# Patient Record
Sex: Male | Born: 1980 | Race: White | Hispanic: No | Marital: Married | Smoking: Former smoker
Health system: Southern US, Community
[De-identification: ages and names within clinical notes are randomized; demographics above are authoritative.]

## PROBLEM LIST (undated history)

## (undated) DIAGNOSIS — C760 Malignant neoplasm of head, face and neck: Secondary | ICD-10-CM

## (undated) DIAGNOSIS — K219 Gastro-esophageal reflux disease without esophagitis: Secondary | ICD-10-CM

## (undated) DIAGNOSIS — E039 Hypothyroidism, unspecified: Secondary | ICD-10-CM

## (undated) DIAGNOSIS — B958 Unspecified staphylococcus as the cause of diseases classified elsewhere: Secondary | ICD-10-CM

## (undated) DIAGNOSIS — R112 Nausea with vomiting, unspecified: Secondary | ICD-10-CM

## (undated) DIAGNOSIS — R682 Dry mouth, unspecified: Secondary | ICD-10-CM

## (undated) DIAGNOSIS — C029 Malignant neoplasm of tongue, unspecified: Secondary | ICD-10-CM

## (undated) DIAGNOSIS — Z923 Personal history of irradiation: Secondary | ICD-10-CM

## (undated) DIAGNOSIS — R001 Bradycardia, unspecified: Principal | ICD-10-CM

## (undated) DIAGNOSIS — Z9889 Other specified postprocedural states: Secondary | ICD-10-CM

## (undated) DIAGNOSIS — E781 Pure hyperglyceridemia: Secondary | ICD-10-CM

## (undated) DIAGNOSIS — M109 Gout, unspecified: Secondary | ICD-10-CM

## (undated) DIAGNOSIS — N209 Urinary calculus, unspecified: Secondary | ICD-10-CM

## (undated) DIAGNOSIS — Z87442 Personal history of urinary calculi: Secondary | ICD-10-CM

## (undated) DIAGNOSIS — H9319 Tinnitus, unspecified ear: Secondary | ICD-10-CM

## (undated) DIAGNOSIS — L089 Local infection of the skin and subcutaneous tissue, unspecified: Secondary | ICD-10-CM

## (undated) HISTORY — PX: TRACHEOSTOMY CLOSURE: SHX458

## (undated) HISTORY — PX: GLOSSECTOMY: SUR645

## (undated) HISTORY — DX: Malignant neoplasm of tongue, unspecified: C02.9

## (undated) HISTORY — DX: Malignant neoplasm of head, face and neck: C76.0

## (undated) HISTORY — PX: MOUTH SURGERY: SHX715

## (undated) HISTORY — DX: Bradycardia, unspecified: R00.1

## (undated) HISTORY — PX: TONGUE BIOPSY: SHX1075

## (undated) HISTORY — DX: Dry mouth, unspecified: R68.2

## (undated) HISTORY — DX: Personal history of irradiation: Z92.3

---

## 2001-09-16 ENCOUNTER — Emergency Department (HOSPITAL_COMMUNITY): Admission: EM | Admit: 2001-09-16 | Discharge: 2001-09-16 | Payer: Self-pay | Admitting: Emergency Medicine

## 2001-11-27 ENCOUNTER — Emergency Department (HOSPITAL_COMMUNITY): Admission: EM | Admit: 2001-11-27 | Discharge: 2001-11-28 | Payer: Self-pay | Admitting: *Deleted

## 2001-11-27 ENCOUNTER — Encounter: Payer: Self-pay | Admitting: *Deleted

## 2008-09-28 ENCOUNTER — Inpatient Hospital Stay (HOSPITAL_COMMUNITY): Admission: AD | Admit: 2008-09-28 | Discharge: 2008-09-30 | Payer: Self-pay | Admitting: Internal Medicine

## 2010-05-04 LAB — COMPREHENSIVE METABOLIC PANEL
ALT: 29 U/L (ref 0–53)
AST: 24 U/L (ref 0–37)
Albumin: 3.7 g/dL (ref 3.5–5.2)
Alkaline Phosphatase: 61 U/L (ref 39–117)
BUN: 11 mg/dL (ref 6–23)
CO2: 28 mEq/L (ref 19–32)
Calcium: 9.2 mg/dL (ref 8.4–10.5)
Chloride: 104 mEq/L (ref 96–112)
Creatinine, Ser: 1.21 mg/dL (ref 0.4–1.5)
GFR calc Af Amer: >60 mL/min (ref >60)
GFR calc non Af Amer: >60 mL/min (ref >60)
Glucose, Bld: 110 mg/dL — ABNORMAL HIGH (ref 70–99)
Potassium: 4.7 mEq/L (ref 3.5–5.1)
Sodium: 137 mEq/L (ref 135–145)
Total Bilirubin: 0.4 mg/dL (ref 0.3–1.2)
Total Protein: 7.2 g/dL (ref 6.0–8.3)

## 2010-05-04 LAB — DIFFERENTIAL
Basophils Absolute: 0.1 10*3/uL (ref 0.0–0.1)
Basophils Absolute: 0.1 10*3/uL (ref 0.0–0.1)
Basophils Relative: 1 % (ref 0–1)
Basophils Relative: 1 % (ref 0–1)
Eosinophils Absolute: 0.1 10*3/uL (ref 0.0–0.7)
Eosinophils Relative: 1 % (ref 0–5)
Eosinophils Relative: 3 % (ref 0–5)
Lymphocytes Relative: 22 % (ref 12–46)
Lymphocytes Relative: 9 % — ABNORMAL LOW (ref 12–46)
Lymphs Abs: 1.2 10*3/uL (ref 0.7–4.0)
Monocytes Absolute: 1 10*3/uL (ref 0.1–1.0)
Monocytes Relative: 8 % (ref 3–12)
Neutro Abs: 10.9 10*3/uL — ABNORMAL HIGH (ref 1.7–7.7)
Neutro Abs: 4.4 10*3/uL (ref 1.7–7.7)
Neutrophils Relative %: 82 % — ABNORMAL HIGH (ref 43–77)

## 2010-05-04 LAB — BASIC METABOLIC PANEL
BUN: 7 mg/dL (ref 6–23)
CO2: 27 mEq/L (ref 19–32)
Calcium: 9.1 mg/dL (ref 8.4–10.5)
GFR calc non Af Amer: >60 mL/min (ref >60)
Glucose, Bld: 100 mg/dL — ABNORMAL HIGH (ref 70–99)

## 2010-05-04 LAB — WOUND CULTURE

## 2010-05-04 LAB — CBC
HCT: 39.2 % (ref 39.0–52.0)
Hemoglobin: 13.9 g/dL (ref 13.0–17.0)
MCHC: 34.7 g/dL (ref 30.0–36.0)
MCHC: 35.4 g/dL (ref 30.0–36.0)
MCV: 88 fL (ref 78.0–100.0)
Platelets: 232 10*3/uL (ref 150–400)
Platelets: 239 10*3/uL (ref 150–400)
RBC: 4.45 MIL/uL (ref 4.22–5.81)
RDW: 12.6 % (ref 11.5–15.5)
RDW: 12.7 % (ref 11.5–15.5)
WBC: 13.2 10*3/uL — ABNORMAL HIGH (ref 4.0–10.5)

## 2010-05-04 LAB — VANCOMYCIN, TROUGH: Vancomycin Tr: <5 ug/mL — ABNORMAL LOW (ref 10.0–20.0)

## 2010-06-12 NOTE — H&P (Signed)
**Note [name]** [name], [name]                 ACCOUNT NO.:  [id number]   MEDICAL RECORD NO.:  [id number]          PATIENT TYPE:  INP   LOCATION:  A216                          FACILITY:  [facility]   PHYSICIAN:  Melissa L. Ladona Ridgel, MD  DATE OF BIRTH:  [birth year]   DATE OF ADMISSION:  09/28/2008  DATE OF DISCHARGE:  LH                              HISTORY & PHYSICAL   CHIEF COMPLAINT:  Left arm pain.   HISTORY OF PRESENT ILLNESS:  The patient is a 30 year old white male who  developed a pimple-like a sore on his left forearm around the area of  his elbow on the lateral side of his arm.  The pimple occurred last  Tuesday.  He played with it, squeezed it and was able to get some  purulent  material out of it but then it stopped draining and started to  increase in size with increasing tightness,swelling, redness and pain.  The patient went to Dr. Sharyon Medicus office on Tuesday and was given a shot  of antibiotics as well as a prescription for medication which he does  not know the name.  He went home to get the antibiotics but the pain  became worse and the swelling worsened as well as redness, so he came to  Dr. Sharyon Medicus office on the day of admission and was asked to be admitted  to the hospital.  The patient had a low grade temperature at that point.  Had some nausea without any vomiting.   REASON FOR ADMISSION:  He did have some intentional weight loss and is  using [employer].  Fever is new.  Chills are new.  Positive nausea  but no vomiting.  EYES:  No blurred vision, double vision.  EARS, NOSE  AND THROAT:  No tinnitus, dysphagia, discharge.  CARDIOVASCULAR:  Chest  pain is negative.  No palpitations.  RESPIRATORY: No shortness of breath  or cough.  GI:  He had  one episode of diarrhea but no constipation.  No  melena, hematochezia.  GU: No hesitancy, frequency, dysuria.  NEUROLOGICALLY:  No headaches or seizure disorder.  MUSCULOSKELETAL:  A  pain in the left arm as described above.  He could not  rate the pain, it  varied.  Described more of a tightness.  HEMATOLOGICALLY:  No bleeding  disorder.  PSYCHIATRIC:  No depression or anxiety.  DERMATOLOGIC:  Cellulitis and abscess of the left forearm as described above.  ENDOCRINOLOGICALLY:  He has no diabetes or thyroid disease.   PAST MEDICAL HISTORY:  1. He had had gout his ankles before, but nothing recent.  2. History of boils.   PAST SURGICAL HISTORY:  None.   ALLERGIES:  None.   MEDICATIONS:  He only took some Advil for pain and the antibiotics that  were prescribed   FAMILY HISTORY:  Mom has rheumatoid arthritis.  Dad is deceased with a  history of asthma.   SOCIAL HISTORY:  Has occasional alcohol.  He does not smoke but he  states that occasional, usually when he has a drink.  Has one child and  is an  [employer].  His contact person is his grandmother, Conall Vangorder.  Her number is [phone].   PHYSICAL EXAMINATION:  VITAL SIGNS:  Temperature was 102, blood pressure  127/73, pulse 94, respirations 18, saturation 95%.  GENERAL:  This is a well-developed, well-nourished, slightly obese,  white male in mild distress secondary to pain in his arm.  HEENT:  He is  normocephalic, atraumatic.  Pupils equal and reactive to light.  Extraocular muscles intact.  He has anicteric sclerae.  Membranes are  moist.  Neck is supple.  There is no JVD, no lymph nodes, no carotid  bruits.  His tympanic membranes are intact without any evidence for  infection.  Neck is supple.  There is no JVD, no lymphadenopathy.  No  carotid bruits.  CHEST:  Is clear to auscultation.  There is no rhonchi, rales or  wheezes.  CARDIOVASCULAR:  Regular rate and rhythm.  Positive S1 and S2.  No S3-4,  no murmurs, rubs or gallops.  ABDOMEN:  Soft, mildly obese, nontender, nondistended with positive  bowel sounds.  There is no guarding or rebound.  No hepatosplenomegaly  is noted.  EXTREMITIES:  On the lateral aspect of the left forearm around the  area  of the elbow, there is a significant cellulitis with an area of abscess  on the posterior side of the arm which feel soft and fluctuant.  There  is scab over the site.  NEUROLOGIC:  He is awake, alert, oriented.  Cranial nerves II-XII are  intact.  Power is 5/5.  DTRs 2+, plantars downgoing.   Labs are pending at the time of this admission.   ASSESSMENT:  This is a 30 year old white male who felt the wound-like  pimple or boil on his left arm last Tuesday.  He scraped it open and was  draining but now it has stopped and become more swollen, red and  painful.  The patient saw Dr. Sherwood Gambler, received an injection of  antibiotics; doses of medication is unknown and the prescription for  antibiotics which he started to take at home.  The wound became more  painful, more edematous and therefore he went back to Dr. Sharyon Medicus office  who referred him to hospital for admission and possible I&D.  1. Abscess of forearm.  I am starting him on the vancomycin and      Avelox.  I have consulted surgery for possible I&D.  2. Nausea.  We leave Zofran.  3. Fever.  Will leave Tylenol.   Total time on this case was 45 minutes.      Melissa L. Ladona Ridgel, MD  Electronically Signed     MLT/MEDQ  D:  09/29/2008  T:  09/29/2008  Job:  [id number]   cc:   Madelin Rear. Sherwood Gambler, MD  Fax: [phone]

## 2010-08-02 ENCOUNTER — Other Ambulatory Visit (HOSPITAL_COMMUNITY): Payer: Self-pay | Admitting: Family Medicine

## 2010-08-02 ENCOUNTER — Ambulatory Visit (HOSPITAL_COMMUNITY)
Admission: RE | Admit: 2010-08-02 | Discharge: 2010-08-02 | Disposition: A | Discharge status: Home / Self Care | Payer: 59 | Source: Ambulatory Visit | Attending: Family Medicine | Admitting: Family Medicine

## 2010-08-02 DIAGNOSIS — M779 Enthesopathy, unspecified: Secondary | ICD-10-CM

## 2010-08-02 DIAGNOSIS — S8990XA Unspecified injury of unspecified lower leg, initial encounter: Secondary | ICD-10-CM | POA: Insufficient documentation

## 2010-08-02 DIAGNOSIS — S99929A Unspecified injury of unspecified foot, initial encounter: Secondary | ICD-10-CM | POA: Insufficient documentation

## 2010-08-02 DIAGNOSIS — X500XXA Overexertion from strenuous movement or load, initial encounter: Secondary | ICD-10-CM | POA: Insufficient documentation

## 2010-08-02 DIAGNOSIS — M25579 Pain in unspecified ankle and joints of unspecified foot: Secondary | ICD-10-CM | POA: Insufficient documentation

## 2011-08-16 ENCOUNTER — Other Ambulatory Visit: Payer: Self-pay | Admitting: Otolaryngology

## 2011-08-16 DIAGNOSIS — C029 Malignant neoplasm of tongue, unspecified: Secondary | ICD-10-CM

## 2011-08-19 ENCOUNTER — Ambulatory Visit
Admission: RE | Admit: 2011-08-19 | Discharge: 2011-08-19 | Disposition: A | Discharge status: Home / Self Care | Payer: 59 | Source: Ambulatory Visit | Attending: Otolaryngology | Admitting: Otolaryngology

## 2011-08-19 DIAGNOSIS — C029 Malignant neoplasm of tongue, unspecified: Secondary | ICD-10-CM

## 2011-08-19 MED ORDER — IOHEXOL 300 MG/ML  SOLN
75.0000 mL | Freq: Once | INTRAMUSCULAR | Status: AC | PRN
  Administered 2011-08-19: 75 mL via INTRAVENOUS

## 2011-08-21 ENCOUNTER — Encounter (HOSPITAL_COMMUNITY): Payer: Self-pay | Admitting: *Deleted

## 2011-08-21 MED ORDER — DEXTROSE 5 % IV SOLN
3.0000 g | INTRAVENOUS | Status: AC
  Administered 2011-08-22: 3 g via INTRAVENOUS
  Filled 2011-08-21: qty 3000

## 2011-08-21 NOTE — H&P (Signed)
  Assessment  . Squamous cell carcinoma of the tongue   (141.9) Orders  CT Neck with contrast; Requested for: 15 Aug 2011. Discussed  Squamous cell carcinoma of the right lateral tongue. No palpable lymph nodes. This will need to be excised surgically with a wide resection of the tongue. We discussed that this needs to be done at the hospital, he will stay in the hospital at least overnight. He will have some change in his articulation but should be able to swallow. I will order a CT of the neck to look for any occult lymph nodes. We discussed the possible need for either therapeutic or prophylactic/staging neck dissection. We also discussed possible need for radiation postop. He absolutely needs to stop smoking for ever and needs to cut back significantly on his alcohol consumption. Reason For Visit  [name] is here today at the kind request of Elfredia Nevins for consultation and opinion for tongue cancer. HPI  5 or 6 month history of a sore on his right lateral tongue that hasn't healed. Recent biopsy was sent to [facility] and revealed squamous cell carcinoma. He denies any ear pain. He smokes about 5 small cigars daily and drinks a 12 pack of beer on the weekends. Allergies  No Known Drug Allergies. Current Meds  Zocor TABS;; RPT. Active Problems  Cancer (199.1);  * tongue, 15 Aug 2011. Family Hx  Family history of Hearing Loss Family history of Reported Family History Of Allergies Family history of Rheumatoid Arthritis. Personal Hx  Being A Social Drinker Current Smoker (305.1). ROS  Otolaryngeal: Tinnitus. Musculoskeletal: Arthralgias. 12 system ROS was obtained and reviewed on the Health Maintenance form dated today.  Positive responses are shown above.  If the symptom is not checked, the patient has denied it. Vital Signs   Recorded by [facility] on 15 Aug 2011 09:40 AM BP:120/80,  Height: 74 in, Weight: 275 lb, BMI: 35.3 kg/m2,  BSA Calculated: 2.49 ,  BMI  Calculated: 35.30. Physical Exam  APPEARANCE: Well developed, well nourished, in no acute distress.  Normal affect, in a pleasant mood.  Oriented to time, place and person. COMMUNICATION: Normal voice   HEAD & FACE:  No scars, lesions or masses of head and face.  Sinuses nontender to palpation.  Salivary glands without mass or tenderness.  Facial strength symmetric.  No facial lesion, scars, or mass. EYES: EOMI with normal primary gaze alignment. Visual acuity grossly intact.  PERRLA EXTERNAL EAR & NOSE: No scars, lesions or masses  EAC & TYMPANIC MEMBRANE:  EAC shows no obstructing lesions or debris and tympanic membranes are normal bilaterally with good movement to insufflation. GROSS HEARING: Normal   TMJ:  Nontender  INTRANASAL EXAM: No polyps or purulence.  NASOPHARYNX: Normal, without lesions. LIPS, TEETH & GUMS: No lip lesions, normal dentition and normal gums. ORAL CAVITY/OROPHARYNX: Pharynx is clear. Dentition in good repair. There is a 2-3 cm slightly raised, circular and ulcerated mass involving the right lateral tongue. There is no extension to the floor of mouth. NECK:  Supple without adenopathy or mass. THYROID:  Normal with no masses palpable.  NEUROLOGIC:  No gross CN deficits. No nystagmus noted.   LYMPHATIC:  No enlarged nodes palpable.

## 2011-08-21 NOTE — Progress Notes (Signed)
Called Dr. Lucky Rathke office, left voice for [facility] with Dr. Lucky Rathke office requesting preop orders.

## 2011-08-22 ENCOUNTER — Encounter (HOSPITAL_COMMUNITY): Payer: Self-pay

## 2011-08-22 ENCOUNTER — Encounter (HOSPITAL_COMMUNITY): Payer: Self-pay | Admitting: Anesthesiology

## 2011-08-22 ENCOUNTER — Encounter (HOSPITAL_COMMUNITY)
Admission: RE | Disposition: A | Discharge status: Home / Self Care | Payer: Self-pay | Source: Ambulatory Visit | Attending: Otolaryngology

## 2011-08-22 ENCOUNTER — Ambulatory Visit (HOSPITAL_COMMUNITY)
Admission: RE | Admit: 2011-08-22 | Discharge: 2011-08-23 | Disposition: A | Discharge status: Home / Self Care | Payer: 59 | Source: Ambulatory Visit | Attending: Otolaryngology | Admitting: Otolaryngology

## 2011-08-22 ENCOUNTER — Encounter (HOSPITAL_COMMUNITY): Payer: Self-pay | Admitting: General Practice

## 2011-08-22 ENCOUNTER — Ambulatory Visit (HOSPITAL_COMMUNITY): Payer: 59 | Admitting: Anesthesiology

## 2011-08-22 DIAGNOSIS — F17200 Nicotine dependence, unspecified, uncomplicated: Secondary | ICD-10-CM | POA: Insufficient documentation

## 2011-08-22 DIAGNOSIS — C029 Malignant neoplasm of tongue, unspecified: Secondary | ICD-10-CM

## 2011-08-22 HISTORY — PX: GLOSSECTOMY: SHX5200

## 2011-08-22 HISTORY — DX: Pure hyperglyceridemia: E78.1

## 2011-08-22 HISTORY — DX: Gout, unspecified: M10.9

## 2011-08-22 LAB — CBC
Hemoglobin: 16.2 g/dL (ref 13.0–17.0)
MCH: 30.6 pg (ref 26.0–34.0)
MCHC: 35.7 g/dL (ref 30.0–36.0)
Platelets: 226 10*3/uL (ref 150–400)
RBC: 5.29 MIL/uL (ref 4.22–5.81)

## 2011-08-22 LAB — SURGICAL PCR SCREEN
MRSA, PCR: NEGATIVE
Staphylococcus aureus: NEGATIVE

## 2011-08-22 SURGERY — GLOSSECTOMY
Anesthesia: General | Site: Mouth | Laterality: Right | Wound class: Clean Contaminated

## 2011-08-22 MED ORDER — PROMETHAZINE HCL 25 MG RE SUPP: 25.0000 mg | Freq: Four times a day (QID) | RECTAL | Status: DC | PRN

## 2011-08-22 MED ORDER — LIDOCAINE HCL 4 % MT SOLN
OROMUCOSAL | Status: DC | PRN
  Administered 2011-08-22: 4 mL via TOPICAL

## 2011-08-22 MED ORDER — DEXAMETHASONE SODIUM PHOSPHATE 4 MG/ML IJ SOLN
INTRAMUSCULAR | Status: DC | PRN
  Administered 2011-08-22: 4 mg via INTRAVENOUS

## 2011-08-22 MED ORDER — LACTATED RINGERS IV SOLN
INTRAVENOUS | Status: DC
Start: 2011-08-22 — End: 2011-08-22
  Administered 2011-08-22: 10:00:00 via INTRAVENOUS

## 2011-08-22 MED ORDER — DEXTROSE-NACL 5-0.9 % IV SOLN
INTRAVENOUS | Status: DC
  Administered 2011-08-22: 20:00:00 via INTRAVENOUS

## 2011-08-22 MED ORDER — PROPOFOL 10 MG/ML IV EMUL
INTRAVENOUS | Status: DC | PRN
  Administered 2011-08-22: 200 mg via INTRAVENOUS
  Administered 2011-08-22 (×2): 50 mg via INTRAVENOUS

## 2011-08-22 MED ORDER — SUCCINYLCHOLINE CHLORIDE 20 MG/ML IJ SOLN
INTRAMUSCULAR | Status: DC | PRN
  Administered 2011-08-22 (×2): 100 mg via INTRAVENOUS

## 2011-08-22 MED ORDER — HYDROCODONE-ACETAMINOPHEN 5-325 MG PO TABS: 1.0000 | ORAL_TABLET | Freq: Four times a day (QID) | ORAL | Status: DC | PRN

## 2011-08-22 MED ORDER — ARTIFICIAL TEARS OP OINT
TOPICAL_OINTMENT | OPHTHALMIC | Status: DC | PRN
  Administered 2011-08-22: 1 via OPHTHALMIC

## 2011-08-22 MED ORDER — LIDOCAINE-EPINEPHRINE 1 %-1:100000 IJ SOLN
INTRAMUSCULAR | Status: AC
  Filled 2011-08-22: qty 1

## 2011-08-22 MED ORDER — PROMETHAZINE HCL 25 MG PO TABS: 25.0000 mg | ORAL_TABLET | Freq: Four times a day (QID) | ORAL | Status: DC | PRN

## 2011-08-22 MED ORDER — ATROPINE SULFATE 0.4 MG/ML IJ SOLN
INTRAMUSCULAR | Status: DC | PRN
  Administered 2011-08-22: 0.4 mg via INTRAVENOUS

## 2011-08-22 MED ORDER — FENTANYL CITRATE 0.05 MG/ML IJ SOLN
INTRAMUSCULAR | Status: DC | PRN
  Administered 2011-08-22 (×3): 50 ug via INTRAVENOUS
  Administered 2011-08-22 (×2): 100 ug via INTRAVENOUS

## 2011-08-22 MED ORDER — MUPIROCIN 2 % EX OINT
TOPICAL_OINTMENT | Freq: Two times a day (BID) | CUTANEOUS | Status: DC
  Administered 2011-08-22: 1 via NASAL
  Filled 2011-08-22: qty 22

## 2011-08-22 MED ORDER — BACITRACIN ZINC 500 UNIT/GM EX OINT
TOPICAL_OINTMENT | CUTANEOUS | Status: AC
  Filled 2011-08-22: qty 15

## 2011-08-22 MED ORDER — HYDROMORPHONE HCL PF 1 MG/ML IJ SOLN
INTRAMUSCULAR | Status: AC
  Filled 2011-08-22: qty 1

## 2011-08-22 MED ORDER — HYDROCODONE-ACETAMINOPHEN 5-325 MG PO TABS
2.0000 | ORAL_TABLET | Freq: Four times a day (QID) | ORAL | Status: DC | PRN
  Administered 2011-08-22 – 2011-08-23 (×2): 2 via ORAL
  Filled 2011-08-22 (×2): qty 2

## 2011-08-22 MED ORDER — MEPERIDINE HCL 25 MG/ML IJ SOLN: 6.2500 mg | INTRAMUSCULAR | Status: DC | PRN

## 2011-08-22 MED ORDER — ROCURONIUM BROMIDE 100 MG/10ML IV SOLN
INTRAVENOUS | Status: DC | PRN
  Administered 2011-08-22: 50 mg via INTRAVENOUS

## 2011-08-22 MED ORDER — SODIUM CHLORIDE 0.9 % IR SOLN
Status: DC | PRN
  Administered 2011-08-22: 1

## 2011-08-22 MED ORDER — NEOSTIGMINE METHYLSULFATE 1 MG/ML IJ SOLN
INTRAMUSCULAR | Status: DC | PRN
  Administered 2011-08-22: 5 mg via INTRAVENOUS

## 2011-08-22 MED ORDER — CLINDAMYCIN HCL 300 MG PO CAPS
300.0000 mg | ORAL_CAPSULE | Freq: Three times a day (TID) | ORAL | Status: DC
  Administered 2011-08-22 – 2011-08-23 (×3): 300 mg via ORAL
  Filled 2011-08-22 (×8): qty 1

## 2011-08-22 MED ORDER — SIMVASTATIN 10 MG PO TABS
10.0000 mg | ORAL_TABLET | Freq: Every day | ORAL | Status: DC
  Administered 2011-08-22: 10 mg via ORAL
  Filled 2011-08-22 (×2): qty 1

## 2011-08-22 MED ORDER — HYDROMORPHONE HCL PF 1 MG/ML IJ SOLN
0.2500 mg | INTRAMUSCULAR | Status: DC | PRN
  Administered 2011-08-22 (×2): 0.5 mg via INTRAVENOUS

## 2011-08-22 MED ORDER — LIDOCAINE HCL 1 % IJ SOLN
INTRAMUSCULAR | Status: DC | PRN
  Administered 2011-08-22: 100 mg via INTRADERMAL

## 2011-08-22 MED ORDER — OXYMETAZOLINE HCL 0.05 % NA SOLN
NASAL | Status: AC
  Filled 2011-08-22: qty 15

## 2011-08-22 MED ORDER — MIDAZOLAM HCL 5 MG/5ML IJ SOLN
INTRAMUSCULAR | Status: DC | PRN
  Administered 2011-08-22: 2 mg via INTRAVENOUS

## 2011-08-22 MED ORDER — KETOROLAC TROMETHAMINE 30 MG/ML IJ SOLN
15.0000 mg | Freq: Once | INTRAMUSCULAR | Status: AC | PRN
  Administered 2011-08-22: 30 mg via INTRAVENOUS

## 2011-08-22 MED ORDER — GLYCOPYRROLATE 0.2 MG/ML IJ SOLN
INTRAMUSCULAR | Status: DC | PRN
  Administered 2011-08-22: .8 mg via INTRAVENOUS

## 2011-08-22 MED ORDER — MUPIROCIN 2 % EX OINT
TOPICAL_OINTMENT | CUTANEOUS | Status: AC
  Administered 2011-08-22: 1 via NASAL
  Filled 2011-08-22: qty 22

## 2011-08-22 MED ORDER — IBUPROFEN 100 MG/5ML PO SUSP
400.0000 mg | Freq: Four times a day (QID) | ORAL | Status: DC | PRN
  Filled 2011-08-22: qty 20

## 2011-08-22 MED ORDER — LACTATED RINGERS IV SOLN
INTRAVENOUS | Status: DC | PRN
  Administered 2011-08-22 (×2): via INTRAVENOUS

## 2011-08-22 MED ORDER — CLINDAMYCIN HCL 300 MG PO CAPS: 300.0000 mg | ORAL_CAPSULE | Freq: Three times a day (TID) | ORAL | Status: AC

## 2011-08-22 MED ORDER — KETOROLAC TROMETHAMINE 30 MG/ML IJ SOLN
INTRAMUSCULAR | Status: AC
  Filled 2011-08-22: qty 1

## 2011-08-22 MED ORDER — PROMETHAZINE HCL 25 MG/ML IJ SOLN
INTRAMUSCULAR | Status: AC
  Filled 2011-08-22: qty 1

## 2011-08-22 MED ORDER — PROMETHAZINE HCL 25 MG/ML IJ SOLN
6.2500 mg | INTRAMUSCULAR | Status: DC | PRN
  Administered 2011-08-22: 12.5 mg via INTRAVENOUS

## 2011-08-22 SURGICAL SUPPLY — 58 items
APPLIER CLIP 9.375 MED OPEN (MISCELLANEOUS)
ATTRACTOMAT 16X20 MAGNETIC DRP (DRAPES) ×2 IMPLANT
BLADE SURG 10 STRL SS (BLADE) IMPLANT
BLADE SURG 15 STRL LF DISP TIS (BLADE) IMPLANT
BLADE SURG 15 STRL SS (BLADE)
CANISTER SUCTION 2500CC (MISCELLANEOUS) ×2 IMPLANT
CLEANER TIP ELECTROSURG 2X2 (MISCELLANEOUS) ×2 IMPLANT
CLIP APPLIE 9.375 MED OPEN (MISCELLANEOUS) IMPLANT
CLOTH BEACON ORANGE TIMEOUT ST (SAFETY) ×2 IMPLANT
CONT SPEC 4OZ CLIKSEAL STRL BL (MISCELLANEOUS) ×2 IMPLANT
COVER SURGICAL LIGHT HANDLE (MISCELLANEOUS) ×2 IMPLANT
COVER TABLE BACK 60X90 (DRAPES) IMPLANT
DRAIN CHANNEL 15F RND FF W/TCR (WOUND CARE) IMPLANT
DRAPE PROXIMA HALF (DRAPES) IMPLANT
ELECT COATED BLADE 2.86 ST (ELECTRODE) ×2 IMPLANT
ELECT REM PT RETURN 9FT ADLT (ELECTROSURGICAL) ×2
ELECTRODE REM PT RTRN 9FT ADLT (ELECTROSURGICAL) ×1 IMPLANT
EVACUATOR SILICONE 100CC (DRAIN) IMPLANT
GAUZE SPONGE 4X4 16PLY XRAY LF (GAUZE/BANDAGES/DRESSINGS) IMPLANT
GLOVE BIO SURGEON STRL SZ 6.5 (GLOVE) ×2 IMPLANT
GLOVE BIOGEL PI IND STRL 6.5 (GLOVE) ×1 IMPLANT
GLOVE BIOGEL PI INDICATOR 6.5 (GLOVE) ×1
GLOVE ECLIPSE 6.5 STRL STRAW (GLOVE) ×2 IMPLANT
GLOVE ECLIPSE 7.5 STRL STRAW (GLOVE) ×4 IMPLANT
GLOVE SURG SS PI 7.0 STRL IVOR (GLOVE) ×2 IMPLANT
GOWN PREVENTION PLUS XLARGE (GOWN DISPOSABLE) ×2 IMPLANT
GOWN STRL NON-REIN LRG LVL3 (GOWN DISPOSABLE) ×6 IMPLANT
KIT BASIN OR (CUSTOM PROCEDURE TRAY) ×2 IMPLANT
KIT ROOM TURNOVER OR (KITS) ×2 IMPLANT
LOCATOR NERVE 3 VOLT (DISPOSABLE) IMPLANT
NS IRRIG 1000ML POUR BTL (IV SOLUTION) ×2 IMPLANT
PAD ARMBOARD 7.5X6 YLW CONV (MISCELLANEOUS) ×4 IMPLANT
PENCIL FOOT CONTROL (ELECTRODE) ×2 IMPLANT
SPECIMEN JAR MEDIUM (MISCELLANEOUS) IMPLANT
SPONGE GAUZE 4X4 12PLY (GAUZE/BANDAGES/DRESSINGS) IMPLANT
SPONGE INTESTINAL PEANUT (DISPOSABLE) IMPLANT
SPONGE LAP 18X18 X RAY DECT (DISPOSABLE) ×2 IMPLANT
STAPLER VISISTAT 35W (STAPLE) ×2 IMPLANT
SURGILUBE 2OZ TUBE FLIPTOP (MISCELLANEOUS) IMPLANT
SUT CHROMIC 3 0 PS 2 (SUTURE) IMPLANT
SUT SILK 2 0 FS (SUTURE) ×2 IMPLANT
SUT SILK 3 0 REEL (SUTURE) IMPLANT
SUT SILK 3 0 SH CR/8 (SUTURE) IMPLANT
SUT SILK 4 0 REEL (SUTURE) ×2 IMPLANT
SUT VIC AB 3-0 SH 27 (SUTURE) ×1
SUT VIC AB 3-0 SH 27XBRD (SUTURE) ×1 IMPLANT
SUT VIC AB 4-0 P-3 18X BRD (SUTURE) ×1 IMPLANT
SUT VIC AB 4-0 P3 18 (SUTURE) ×1
SUT VIC AB 4-0 RB1 18 (SUTURE) IMPLANT
SUT VICRYL 4-0 PS2 18IN ABS (SUTURE) ×8 IMPLANT
TOWEL OR 17X24 6PK STRL BLUE (TOWEL DISPOSABLE) ×2 IMPLANT
TOWEL OR 17X26 10 PK STRL BLUE (TOWEL DISPOSABLE) ×2 IMPLANT
TOWEL OR NON WOVEN STRL DISP B (DISPOSABLE) ×2 IMPLANT
TRAY ENT MC OR (CUSTOM PROCEDURE TRAY) ×2 IMPLANT
TRAY FOLEY CATH 14FRSI W/METER (CATHETERS) IMPLANT
TUBE CONNECTING 12X1/4 (SUCTIONS) IMPLANT
UNDERPAD 30X30 INCONTINENT (UNDERPADS AND DIAPERS) IMPLANT
WATER STERILE IRR 1000ML POUR (IV SOLUTION) IMPLANT

## 2011-08-22 NOTE — Anesthesia Postprocedure Evaluation (Signed)
  Anesthesia Post-op Note  Patient: [name]  Procedure(s) Performed: Procedure(s) (LRB): GLOSSECTOMY (Right)  Patient Location: PACU  Anesthesia Type: General  Level of Consciousness: awake  Airway and Oxygen Therapy: Patient Spontanous Breathing  Post-op Pain: mild  Post-op Assessment: Post-op Vital signs reviewed  Post-op Vital Signs: stable  Complications: No apparent anesthesia complications

## 2011-08-22 NOTE — OR Nursing (Signed)
Tubed frozen specimen to pathology. Called at 10:59 to verify that the specimen had been sent. Received phone call at 11:05 indicating the specimen had been received.  Oralia Manis, RN

## 2011-08-22 NOTE — Anesthesia Procedure Notes (Addendum)
Procedure Name: Intubation Date/Time: 08/22/2011 10:30 AM Performed by: Leona Singleton A Pre-anesthesia Checklist: Patient identified, Emergency Drugs available, Suction available, Patient being monitored and Timeout performed Patient Re-evaluated:Patient Re-evaluated prior to inductionOxygen Delivery Method: Circle system utilized Preoxygenation: Pre-oxygenation with 100% oxygen Intubation Type: IV induction Ventilation: Two handed mask ventilation required and Oral airway inserted - appropriate to patient size Laryngoscope Size: Hyacinth Meeker and 2 Grade View: Grade III Tube type: Oral Tube size: 7.5 mm Number of attempts: 3 Airway Equipment and Method: Stylet and Oral airway Placement Confirmation: ETT inserted through vocal cords under direct vision,  positive ETCO2,  CO2 detector and breath sounds checked- equal and bilateral Secured at: 23 cm Tube secured with: Tape Dental Injury: Teeth and Oropharynx as per pre-operative assessment  Difficulty Due To: Difficult Airway- due to large tongue and Difficult Airway- due to anterior larynx Comments: Pre-O2. SIVI. 2-handed mask ventilation with OPA. DL x1 with Mac 4, grade 3 view, LTA applied, view lost. DL x1 with Mac 3, grade 4 view. DL x1 with Miller 2 by MDA, ETT 7.5 passed under direct vision. +etCO2, BS=B. Lips and teeth intact as preop.

## 2011-08-22 NOTE — Transfer of Care (Signed)
Immediate Anesthesia Transfer of Care Note  Patient: [name]  Procedure(s) Performed: Procedure(s) (LRB): GLOSSECTOMY (Right)  Patient Location: PACU  Anesthesia Type: General  Level of Consciousness: awake, alert , oriented and patient cooperative  Airway & Oxygen Therapy: Patient Spontanous Breathing  Post-op Assessment: Report given to PACU RN and Post -op Vital signs reviewed and stable  Post vital signs: Reviewed and stable  Complications: No apparent anesthesia complications

## 2011-08-22 NOTE — Interval H&P Note (Signed)
History and Physical Interval Note:  08/22/2011 10:06 AM  [name]  has presented today for surgery, with the diagnosis of CARCINOMA OF THE TONGUE  The various methods of treatment have been discussed with the patient and family. After consideration of risks, benefits and other options for treatment, the patient has consented to  Procedure(s) (LRB): GLOSSECTOMY (Right) as a surgical intervention .  The patient's history has been reviewed, patient examined, no change in status, stable for surgery.  I have reviewed the patient's chart and labs.  Questions were answered to the patient's satisfaction.     Jalon Blackwelder

## 2011-08-22 NOTE — Preoperative (Signed)
Beta Blockers   Reason not to administer Beta Blockers:Not Applicable 

## 2011-08-22 NOTE — Progress Notes (Signed)
Patient ID: [name], male   DOB: 1980-11-03, 31 y.o.   MRN: [id number]  No breathing trouble, but swallowing is going very slowly.   Exam: Suture line intact without swelling or hematoma.   Stable post op. Continue to try to drink more. Recheck in am.

## 2011-08-22 NOTE — Op Note (Signed)
OPERATIVE REPORT  DATE OF SURGERY: 08/22/2011  PATIENT:  [name],  31 y.o. male  PRE-OPERATIVE DIAGNOSIS:  CARCINOMA OF THE TONGUE  POST-OPERATIVE DIAGNOSIS:  Carcinoma of the Tongue  PROCEDURE:  Procedure(s): GLOSSECTOMY  SURGEON:  Susy Frizzle, MD  ASSISTANTS: Beckey Rutter, PA   ANESTHESIA:   general  EBL:  30 ml  DRAINS: none   LOCAL MEDICATIONS USED:  NONE  SPECIMEN:  Source of Specimen:  Right tongue, partial glossectomy  COUNTS:  YES  PROCEDURE DETAILS: Patient to recovery room and placed on the operating table in the supine position. Following induction of general endotracheal anesthesia, the mouth and face were draped in a standard fashion. A small bite GERD was used on the left side to keep the port. The trocar was used on the tip of the tongue to retract the tongue. The tumor was isolated and identified. The electrocautery was used to mark the proposed mucosal incisions allowing at least a centimeter of healthy mucosa surrounding the lesion. Alvan Dame is an used to incise through the mucosa and through the muscular layers. Care was taken to make sure that the deep margin was at least a centimeter as well. The entire lesion was removed. Working sutures were placed. Single suture anteriorly, and a double suture posteriorly. Frozen section evaluation confirmed that all margins were clear. The wound was irrigated with saline and then reapproximated using 3-0 Vicryl suture in the deep muscular layer, and a submucosal closure of interrupted 4-0 Vicryl, and a running 4-0 Vicryl mucosal closure. 4-0 silk ties were used as needed for small vessels were identified during the dissection.   PATIENT DISPOSITION:  PACU - hemodynamically stable.

## 2011-08-22 NOTE — Anesthesia Preprocedure Evaluation (Addendum)
Anesthesia Evaluation  Patient identified by MRN, date of birth, ID band Patient awake    Reviewed: Allergy & Precautions, H&P , NPO status , Patient's Chart, lab work & pertinent test results  History of Anesthesia Complications Negative for: history of anesthetic complications  Airway Mallampati: III TM Distance: >3 FB Neck ROM: Full    Dental  (+) Teeth Intact and Dental Advisory Given   Pulmonary former smoker,          Cardiovascular negative cardio ROS      Neuro/Psych negative neurological ROS  negative psych ROS   GI/Hepatic negative GI ROS, (+)     substance abuse  alcohol use,   Endo/Other  Gout  Renal/GU negative Renal ROS     Musculoskeletal negative musculoskeletal ROS (+)   Abdominal (+) + obese,   Peds negative pediatric ROS (+)  Hematology negative hematology ROS (+)   Anesthesia Other Findings   Reproductive/Obstetrics negative OB ROS                          Anesthesia Physical Anesthesia Plan  ASA: I  Anesthesia Plan: General   Post-op Pain Management:    Induction: Intravenous  Airway Management Planned: Oral ETT  Additional Equipment:   Intra-op Plan:   Post-operative Plan: Extubation in OR  Informed Consent: I have reviewed the patients History and Physical, chart, labs and discussed the procedure including the risks, benefits and alternatives for the proposed anesthesia with the patient or authorized representative who has indicated his/her understanding and acceptance.   Dental advisory given  Plan Discussed with: CRNA and Surgeon  Anesthesia Plan Comments:         Anesthesia Quick Evaluation

## 2011-08-23 ENCOUNTER — Encounter (HOSPITAL_COMMUNITY): Payer: Self-pay | Admitting: Otolaryngology

## 2011-08-23 NOTE — Discharge Summary (Signed)
  Physician Discharge Summary  Patient ID: [name] MRN: [id number] DOB/AGE: March 14, 1980 31 y.o.  Admit date: 08/22/2011 Discharge date: 08/23/2011  Admission Diagnoses:Tongue cancer  Discharge Diagnoses:  Active Problems:  * No active hospital problems. *    Discharged Condition: good  Hospital Course: No complications, taking PO liquids well on discharge  Consults: None  Significant Diagnostic Studies: none  Treatments: surgery: Partial glossectomy  Discharge Exam: Blood pressure 131/72, pulse 73, temperature 97.9 F (36.6 C), temperature source Oral, resp. rate 18, height 6\' 2"  (1.88 m), weight 270 lb (122.471 kg), SpO2 98.00%. PHYSICAL EXAM: Tongue closure looks good, no swelling or hematoma. Breathing without any difficulty.  Disposition:   Discharge Orders    Future Orders Please Complete By Expires   Increase activity slowly        Medication List  As of 08/23/2011  9:31 AM   TAKE these medications         clindamycin 300 MG capsule   Commonly known as: CLEOCIN   Take 1 capsule (300 mg total) by mouth 3 (three) times daily.      HYDROcodone-acetaminophen 5-325 MG per tablet   Commonly known as: NORCO/VICODIN   Take 1 tablet by mouth every 6 (six) hours as needed. 1-2 tablets Q4-6hr      promethazine 25 MG suppository   Commonly known as: PHENERGAN   Place 1 suppository (25 mg total) rectally every 6 (six) hours as needed for nausea.      simvastatin 10 MG tablet   Commonly known as: ZOCOR   Take 10 mg by mouth at bedtime.           Follow-up Information    Follow up with Serena Colonel, MD. Schedule an appointment as soon as possible for a visit in 2 weeks.   Contact information:   [address], [address] [place] [address] [phone]          Signed: Serena Colonel 08/23/2011, 9:31 AM

## 2011-09-04 ENCOUNTER — Encounter (HOSPITAL_COMMUNITY): Payer: Self-pay | Admitting: Pharmacy Technician

## 2011-09-05 ENCOUNTER — Encounter (HOSPITAL_COMMUNITY): Payer: Self-pay | Admitting: *Deleted

## 2011-09-05 MED ORDER — DEXTROSE 5 % IV SOLN
3.0000 g | INTRAVENOUS | Status: AC
  Administered 2011-09-06: 3 g via INTRAVENOUS
  Filled 2011-09-05: qty 3000

## 2011-09-06 ENCOUNTER — Encounter (HOSPITAL_COMMUNITY)
Admission: RE | Disposition: A | Discharge status: Home / Self Care | Payer: Self-pay | Source: Ambulatory Visit | Attending: Otolaryngology

## 2011-09-06 ENCOUNTER — Inpatient Hospital Stay (HOSPITAL_COMMUNITY)
Admission: RE | Admit: 2011-09-06 | Discharge: 2011-09-08 | DRG: 130 | Disposition: A | Discharge status: Home / Self Care | Payer: 59 | Source: Ambulatory Visit | Attending: Otolaryngology | Admitting: Otolaryngology

## 2011-09-06 ENCOUNTER — Ambulatory Visit (HOSPITAL_COMMUNITY): Payer: 59 | Admitting: Certified Registered"

## 2011-09-06 ENCOUNTER — Encounter (HOSPITAL_COMMUNITY): Payer: Self-pay | Admitting: Certified Registered"

## 2011-09-06 ENCOUNTER — Encounter (HOSPITAL_COMMUNITY): Payer: Self-pay | Admitting: *Deleted

## 2011-09-06 DIAGNOSIS — N189 Chronic kidney disease, unspecified: Secondary | ICD-10-CM | POA: Diagnosis present

## 2011-09-06 DIAGNOSIS — Z87891 Personal history of nicotine dependence: Secondary | ICD-10-CM

## 2011-09-06 DIAGNOSIS — C029 Malignant neoplasm of tongue, unspecified: Principal | ICD-10-CM | POA: Diagnosis present

## 2011-09-06 HISTORY — PX: RADICAL NECK DISSECTION: SHX2284

## 2011-09-06 HISTORY — PX: GLOSSECTOMY: SHX5200

## 2011-09-06 HISTORY — DX: Local infection of the skin and subcutaneous tissue, unspecified: L08.9

## 2011-09-06 HISTORY — DX: Unspecified staphylococcus as the cause of diseases classified elsewhere: B95.8

## 2011-09-06 SURGERY — GLOSSECTOMY
Anesthesia: General | Site: Mouth | Laterality: Right | Wound class: Clean Contaminated

## 2011-09-06 MED ORDER — BACITRACIN ZINC 500 UNIT/GM EX OINT
TOPICAL_OINTMENT | CUTANEOUS | Status: DC | PRN
  Administered 2011-09-06: 1 via TOPICAL

## 2011-09-06 MED ORDER — HYDROCODONE-ACETAMINOPHEN 5-325 MG PO TABS: 1.0000 | ORAL_TABLET | ORAL | Status: DC | PRN

## 2011-09-06 MED ORDER — ONDANSETRON HCL 4 MG/2ML IJ SOLN: 4.0000 mg | Freq: Once | INTRAMUSCULAR | Status: DC | PRN

## 2011-09-06 MED ORDER — LACTATED RINGERS IV SOLN
INTRAVENOUS | Status: DC | PRN
  Administered 2011-09-06 (×2): via INTRAVENOUS

## 2011-09-06 MED ORDER — LIDOCAINE-EPINEPHRINE 1 %-1:100000 IJ SOLN
INTRAMUSCULAR | Status: AC
  Filled 2011-09-06: qty 2

## 2011-09-06 MED ORDER — DEXAMETHASONE SODIUM PHOSPHATE 4 MG/ML IJ SOLN
INTRAMUSCULAR | Status: DC | PRN
  Administered 2011-09-06: 4 mg via INTRAVENOUS

## 2011-09-06 MED ORDER — FENTANYL CITRATE 0.05 MG/ML IJ SOLN
INTRAMUSCULAR | Status: DC | PRN
  Administered 2011-09-06 (×2): 50 ug via INTRAVENOUS
  Administered 2011-09-06: 100 ug via INTRAVENOUS
  Administered 2011-09-06 (×11): 50 ug via INTRAVENOUS

## 2011-09-06 MED ORDER — IBUPROFEN 100 MG/5ML PO SUSP
400.0000 mg | Freq: Four times a day (QID) | ORAL | Status: DC | PRN
  Administered 2011-09-07 – 2011-09-08 (×3): 400 mg via ORAL
  Filled 2011-09-06 (×6): qty 20

## 2011-09-06 MED ORDER — HYDROMORPHONE HCL PF 1 MG/ML IJ SOLN
0.2500 mg | INTRAMUSCULAR | Status: DC | PRN
  Administered 2011-09-06: 0.5 mg via INTRAVENOUS

## 2011-09-06 MED ORDER — PROMETHAZINE HCL 25 MG RE SUPP
25.0000 mg | Freq: Four times a day (QID) | RECTAL | Status: DC | PRN
  Administered 2011-09-06: 25 mg via RECTAL
  Filled 2011-09-06: qty 1

## 2011-09-06 MED ORDER — BACITRACIN ZINC 500 UNIT/GM EX OINT
1.0000 "application " | TOPICAL_OINTMENT | Freq: Three times a day (TID) | CUTANEOUS | Status: DC
  Administered 2011-09-06 – 2011-09-08 (×5): 1 via TOPICAL
  Filled 2011-09-06: qty 15

## 2011-09-06 MED ORDER — CLINDAMYCIN PHOSPHATE 600 MG/50ML IV SOLN
600.0000 mg | Freq: Three times a day (TID) | INTRAVENOUS | Status: DC
  Administered 2011-09-06 – 2011-09-07 (×3): 600 mg via INTRAVENOUS
  Filled 2011-09-06 (×5): qty 50

## 2011-09-06 MED ORDER — MORPHINE SULFATE 2 MG/ML IJ SOLN
2.0000 mg | INTRAMUSCULAR | Status: DC | PRN
  Administered 2011-09-06: 2 mg via INTRAVENOUS
  Administered 2011-09-06: 4 mg via INTRAVENOUS
  Filled 2011-09-06: qty 2
  Filled 2011-09-06: qty 1

## 2011-09-06 MED ORDER — PROPOFOL 10 MG/ML IV EMUL
INTRAVENOUS | Status: DC | PRN
  Administered 2011-09-06: 300 mg via INTRAVENOUS

## 2011-09-06 MED ORDER — ONDANSETRON 8 MG PO TBDP
8.0000 mg | ORAL_TABLET | Freq: Once | ORAL | Status: AC
  Administered 2011-09-06: 8 mg via ORAL
  Filled 2011-09-06: qty 1

## 2011-09-06 MED ORDER — ROCURONIUM BROMIDE 100 MG/10ML IV SOLN
INTRAVENOUS | Status: DC | PRN
  Administered 2011-09-06: 10 mg via INTRAVENOUS
  Administered 2011-09-06: 50 mg via INTRAVENOUS

## 2011-09-06 MED ORDER — HYDROMORPHONE HCL PF 1 MG/ML IJ SOLN
INTRAMUSCULAR | Status: AC
  Filled 2011-09-06: qty 1

## 2011-09-06 MED ORDER — DEXTROSE-NACL 5-0.9 % IV SOLN
INTRAVENOUS | Status: DC
  Administered 2011-09-06: 22:00:00 via INTRAVENOUS

## 2011-09-06 MED ORDER — LACTATED RINGERS IV SOLN
INTRAVENOUS | Status: DC
  Administered 2011-09-06: 09:00:00 via INTRAVENOUS

## 2011-09-06 MED ORDER — ONDANSETRON HCL 4 MG/2ML IJ SOLN
INTRAMUSCULAR | Status: DC | PRN
  Administered 2011-09-06: 4 mg via INTRAVENOUS

## 2011-09-06 MED ORDER — PROMETHAZINE HCL 25 MG PO TABS: 25.0000 mg | ORAL_TABLET | Freq: Four times a day (QID) | ORAL | Status: DC | PRN

## 2011-09-06 MED ORDER — PHENYLEPHRINE HCL 10 MG/ML IJ SOLN
INTRAMUSCULAR | Status: DC | PRN
  Administered 2011-09-06: 80 ug via INTRAVENOUS
  Administered 2011-09-06: 40 ug via INTRAVENOUS
  Administered 2011-09-06: 80 ug via INTRAVENOUS

## 2011-09-06 MED ORDER — OXYMETAZOLINE HCL 0.05 % NA SOLN
NASAL | Status: AC
  Filled 2011-09-06: qty 15

## 2011-09-06 MED ORDER — BACITRACIN ZINC 500 UNIT/GM EX OINT
TOPICAL_OINTMENT | CUTANEOUS | Status: AC
  Filled 2011-09-06: qty 30

## 2011-09-06 MED ORDER — HETASTARCH-ELECTROLYTES 6 % IV SOLN
INTRAVENOUS | Status: DC | PRN
  Administered 2011-09-06: 13:00:00 via INTRAVENOUS

## 2011-09-06 MED ORDER — MIDAZOLAM HCL 5 MG/5ML IJ SOLN
INTRAMUSCULAR | Status: DC | PRN
  Administered 2011-09-06 (×3): 2 mg via INTRAVENOUS

## 2011-09-06 MED ORDER — LIDOCAINE HCL (CARDIAC) 20 MG/ML IV SOLN
INTRAVENOUS | Status: DC | PRN
  Administered 2011-09-06: 50 mg via INTRAVENOUS

## 2011-09-06 SURGICAL SUPPLY — 67 items
APPLIER CLIP 9.375 MED OPEN (MISCELLANEOUS) ×2
APPLIER CLIP 9.375 SM OPEN (CLIP)
ATTRACTOMAT 16X20 MAGNETIC DRP (DRAPES) IMPLANT
BLADE SURG 10 STRL SS (BLADE) ×2 IMPLANT
BLADE SURG 15 STRL LF DISP TIS (BLADE) ×1 IMPLANT
BLADE SURG 15 STRL SS (BLADE) ×1
CANISTER SUCTION 2500CC (MISCELLANEOUS) ×2 IMPLANT
CLEANER TIP ELECTROSURG 2X2 (MISCELLANEOUS) ×2 IMPLANT
CLIP APPLIE 9.375 MED OPEN (MISCELLANEOUS) ×1 IMPLANT
CLIP APPLIE 9.375 SM OPEN (CLIP) IMPLANT
CLOTH BEACON ORANGE TIMEOUT ST (SAFETY) ×2 IMPLANT
CONNECTOR UNIV Y (MISCELLANEOUS) IMPLANT
CONT SPEC 4OZ CLIKSEAL STRL BL (MISCELLANEOUS) IMPLANT
CORDS BIPOLAR (ELECTRODE) ×2 IMPLANT
COVER SURGICAL LIGHT HANDLE (MISCELLANEOUS) ×2 IMPLANT
COVER TABLE BACK 60X90 (DRAPES) IMPLANT
DRAIN CHANNEL 15F RND FF W/TCR (WOUND CARE) ×2 IMPLANT
DRAIN SNY 10 ROU (WOUND CARE) ×2 IMPLANT
DRAPE INCISE 23X17 IOBAN STRL (DRAPES)
DRAPE INCISE IOBAN 23X17 STRL (DRAPES) IMPLANT
DRAPE PROXIMA HALF (DRAPES) ×4 IMPLANT
ELECT COATED BLADE 2.86 ST (ELECTRODE) ×2 IMPLANT
ELECT REM PT RETURN 9FT ADLT (ELECTROSURGICAL) ×2
ELECTRODE REM PT RTRN 9FT ADLT (ELECTROSURGICAL) ×1 IMPLANT
EVACUATOR SILICONE 100CC (DRAIN) ×2 IMPLANT
GAUZE SPONGE 4X4 16PLY XRAY LF (GAUZE/BANDAGES/DRESSINGS) ×10 IMPLANT
GLOVE BIO SURGEON STRL SZ 6.5 (GLOVE) ×2 IMPLANT
GLOVE BIO SURGEON STRL SZ7.5 (GLOVE) ×2 IMPLANT
GLOVE BIOGEL PI IND STRL 6.5 (GLOVE) ×2 IMPLANT
GLOVE BIOGEL PI INDICATOR 6.5 (GLOVE) ×2
GLOVE ECLIPSE 7.5 STRL STRAW (GLOVE) ×2 IMPLANT
GOWN STRL NON-REIN LRG LVL3 (GOWN DISPOSABLE) ×6 IMPLANT
KIT BASIN OR (CUSTOM PROCEDURE TRAY) ×2 IMPLANT
KIT ROOM TURNOVER OR (KITS) ×2 IMPLANT
LOCATOR NERVE 3 VOLT (DISPOSABLE) IMPLANT
NEEDLE HYPO 25GX1X1/2 BEV (NEEDLE) ×2 IMPLANT
NS IRRIG 1000ML POUR BTL (IV SOLUTION) ×2 IMPLANT
PAD ARMBOARD 7.5X6 YLW CONV (MISCELLANEOUS) ×4 IMPLANT
PENCIL FOOT CONTROL (ELECTRODE) ×2 IMPLANT
SPECIMEN JAR MEDIUM (MISCELLANEOUS) ×2 IMPLANT
SPONGE GAUZE 4X4 12PLY (GAUZE/BANDAGES/DRESSINGS) IMPLANT
SPONGE INTESTINAL PEANUT (DISPOSABLE) IMPLANT
SPONGE LAP 18X18 X RAY DECT (DISPOSABLE) ×2 IMPLANT
STAPLER VISISTAT 35W (STAPLE) IMPLANT
SURGILUBE 2OZ TUBE FLIPTOP (MISCELLANEOUS) IMPLANT
SUT CHROMIC 3 0 PS 2 (SUTURE) IMPLANT
SUT CHROMIC 3 0 SH 27 (SUTURE) ×2 IMPLANT
SUT CHROMIC 5 0 P 3 (SUTURE) IMPLANT
SUT ETHILON 3 0 PS 1 (SUTURE) ×2 IMPLANT
SUT ETHILON 5 0 PS 2 18 (SUTURE) IMPLANT
SUT SILK 2 0 FS (SUTURE) IMPLANT
SUT SILK 2 0 REEL (SUTURE) IMPLANT
SUT SILK 3 0 REEL (SUTURE) IMPLANT
SUT SILK 3 0 SH CR/8 (SUTURE) ×2 IMPLANT
SUT SILK 4 0 REEL (SUTURE) ×4 IMPLANT
SUT VIC AB 3-0 SH 18 (SUTURE) ×2 IMPLANT
SUT VIC AB 3-0 SH 27 (SUTURE)
SUT VIC AB 3-0 SH 27XBRD (SUTURE) IMPLANT
SUT VIC AB 4-0 RB1 18 (SUTURE) IMPLANT
TOWEL OR 17X24 6PK STRL BLUE (TOWEL DISPOSABLE) ×2 IMPLANT
TOWEL OR 17X26 10 PK STRL BLUE (TOWEL DISPOSABLE) ×2 IMPLANT
TRAY ENT MC OR (CUSTOM PROCEDURE TRAY) ×2 IMPLANT
TRAY FOLEY CATH 14FRSI W/METER (CATHETERS) IMPLANT
TUBE CONNECTING 12X1/4 (SUCTIONS) ×2 IMPLANT
TUBE FEEDING 10FR FLEXIFLO (MISCELLANEOUS) IMPLANT
UNDERPAD 30X30 INCONTINENT (UNDERPADS AND DIAPERS) IMPLANT
WATER STERILE IRR 1000ML POUR (IV SOLUTION) IMPLANT

## 2011-09-06 NOTE — Anesthesia Preprocedure Evaluation (Signed)
Anesthesia Evaluation  Patient identified by MRN, date of birth, ID band Patient awake    Reviewed: Allergy & Precautions, H&P , NPO status , Patient's Chart, lab work & pertinent test results  Airway Mallampati: I TM Distance: >3 FB Neck ROM: Full    Dental  (+) Teeth Intact and Dental Advisory Given   Pulmonary  breath sounds clear to auscultation        Cardiovascular Rhythm:Regular Rate:Normal     Neuro/Psych    GI/Hepatic   Endo/Other    Renal/GU      Musculoskeletal   Abdominal   Peds  Hematology   Anesthesia Other Findings   Reproductive/Obstetrics                           Anesthesia Physical Anesthesia Plan  ASA: II  Anesthesia Plan: General   Post-op Pain Management:    Induction: Intravenous  Airway Management Planned: Oral ETT  Additional Equipment:   Intra-op Plan:   Post-operative Plan: Extubation in OR  Informed Consent: I have reviewed the patients History and Physical, chart, labs and discussed the procedure including the risks, benefits and alternatives for the proposed anesthesia with the patient or authorized representative who has indicated his/her understanding and acceptance.   Dental advisory given  Plan Discussed with: CRNA, Anesthesiologist and Surgeon  Anesthesia Plan Comments:         Anesthesia Quick Evaluation  

## 2011-09-06 NOTE — Transfer of Care (Signed)
Immediate Anesthesia Transfer of Care Note  Patient: [name]  Procedure(s) Performed: Procedure(s) (LRB): GLOSSECTOMY (Right) RADICAL NECK DISSECTION (Right)  Patient Location: PACU  Anesthesia Type: General  Level of Consciousness: sedated  Airway & Oxygen Therapy: Patient Spontanous Breathing and Patient connected to nasal cannula oxygen  Post-op Assessment: Report given to PACU RN, Post -op Vital signs reviewed and stable and Patient moving all extremities  Post vital signs: Reviewed and stable  Complications: No apparent anesthesia complications

## 2011-09-06 NOTE — Progress Notes (Signed)
Patient ID: [name], male   DOB: 01-11-1981, 31 y.o.   MRN: [id number]  Post op evening check  Complains of pain - neck and tongue.  Voice clear, no trouble breathing. All cranial nerves normally functioning.  Flaps look excellent. JP functioning with bloody drainage.   Stable post op, doing well. Continue overnight observation at least.

## 2011-09-06 NOTE — Anesthesia Postprocedure Evaluation (Signed)
  Anesthesia Post-op Note  Patient: [name]  Procedure(s) Performed: Procedure(s) (LRB): GLOSSECTOMY (Right) RADICAL NECK DISSECTION (Right)  Patient Location: PACU  Anesthesia Type: General  Level of Consciousness: awake, alert  and oriented  Airway and Oxygen Therapy: Patient Spontanous Breathing and Patient connected to nasal cannula oxygen  Post-op Pain: mild  Post-op Assessment: Post-op Vital signs reviewed  Post-op Vital Signs: Reviewed  Complications: No apparent anesthesia complications

## 2011-09-06 NOTE — H&P (Signed)
  [name] is an 31 y.o. male.   Chief Complaint: Tongue cancer HPI: T1N0 tongue cancer. Close deep margin on first resection.  Past Medical History  Diagnosis Date  . High triglycerides   . Cancer     tongue cancer  . Gout   . Chronic kidney disease     Kidney Stone  . Staph skin infection     Elbow- resolved.      Past Surgical History  Procedure Date  . Mouth surgery     biopsy- (08/06/2011 Dr. Barbette Merino), also had tissue graft (oral) 2010  . Glossectomy   . Glossectomy 08/22/2011    Procedure: GLOSSECTOMY;  Surgeon: Serena Colonel, MD;  Location: [facility] OR;  Service: ENT;  Laterality: Right;  WITH FROZEN SECTION    History reviewed. No pertinent family history. Social History:  reports that he quit smoking about 3 weeks ago. He does not have any smokeless tobacco history on file. He reports that he does not drink alcohol or use illicit drugs.  Allergies: No Known Allergies  Medications Prior to Admission  Medication Sig Dispense Refill  . simvastatin (ZOCOR) 10 MG tablet Take 10 mg by mouth at bedtime.        No results found for this or any previous visit (from the past 48 hour(s)). No results found.  ROS: otherwise negative  Blood pressure 129/77, pulse 75, temperature 98 F (36.7 C), temperature source Oral, resp. rate 18, SpO2 98.00%.  PHYSICAL EXAM: Overall appearance:  Healthy appearing, in no distress Head:  Normocephalic, atraumatic. Ears: External auditory canals are clear; tympanic membranes are intact and the middle ears are free of any effusion. Nose: External nose is healthy in appearance. Internal nasal exam free of any lesions or obstruction. Oral Cavity:  There are no mucosal lesions or masses identified. Oral Pharynx/Hypopharynx/Larynx:Right tongue healing from prior surgery. Neuro:  No identifiable neurologic deficits. Neck: No palpable neck masses.  Studies Reviewed: none    Assessment/Plan Re-excision tongue cancer for deeper margin and  modified staging neck dissection.  Aliceson Dolbow 09/06/2011, 9:10 AM

## 2011-09-06 NOTE — Op Note (Signed)
OPERATIVE REPORT  DATE OF SURGERY: 09/06/2011  PATIENT:  [name],  31 y.o. male  PRE-OPERATIVE DIAGNOSIS:  tongue cancer   POST-OPERATIVE DIAGNOSIS:  tongue cancer   PROCEDURE:  Procedure(s): GLOSSECTOMY RADICAL NECK DISSECTION  SURGEON:  Susy Frizzle, MD  ASSISTANTS: Beckey Rutter, PA   ANESTHESIA:   general  EBL:  150 ml  DRAINS: (15 [place]) Jackson-Pratt drain(s) with closed bulb suction in the Right neck   LOCAL MEDICATIONS USED:  NONE  SPECIMEN:  Source of Specimen:  Right modified neck dissection, levels 12 and 3; right lateral tongue reexcision, deep margin  COUNTS:  YES  PROCEDURE DETAILS: Patient was taken to the operating room and placed on the operating table in the supine position. Following induction of general endotracheal anesthesia, the table was turned and the patient was draped in a standard fashion. #1 modified neck dissection, right. A hemi-apron incision was outlined with a marking pen in the right neck. Electrocautery was used to incise the skin and subcutaneous tissue through the platysma layer. Subplatysmal flaps were elevated superiorly to the mandible and inferiorly to the omohyoid muscle. The external jugular vein was sacrificed. The lateral fascia was dissected off the sternocleidomastoid muscle and brought forward. The greater regular nerve was preserved. The marginal branches of the facial nerve were identified and reflected superiorly. The facial nodes were then taken as well as the facial vessels. Vessels were ligated between clamps and divided. 4-0 silk ties were used throughout the case. The submandibular gland was brought inferiorly. Lymph node bearing tissue was dissected anteriorly off the anterior belly of the digastric muscles bilaterally. This was brought inferior with the submandibular gland. The mylohyoid muscle was reflected superiorly exposing the submandibular ganglion, and the lingual nerve as well as the submandibular duct. The  duct was ligated. The ganglion was ligated. Submental and submandibular triangles were then brought posteriorly. The facial artery was ligated between clamps and divided as it entered the gland. Fascia was dissected off the anterior strap muscles and brought posteriorly exposing the carotid fascia. The carotid arteries and internal jugular vein and vagus nerve were all identified and preserved. The jugular branches were ligated between clamps and divided. On back posteriorly, the fascia was dissected off the medial surface of the sternocleidomastoid muscle. The spinal accessory nerve was identified and preserved. The fibrofatty tissue posterior and superior was dissected off of the splenius capitis and levator scapulae muscles and brought forward. The posterior dissection was completed as the fibrofatty tissue was dissected forward off of the sensory branches of the cervical plexus. The jugular was cleaned of surrounding tissue. Her multiple nodes along the jugular chain. The lower limit of dissection was the omohyoid muscle which was preserved. The hypoglossal nerve was also identified and preserved as the ranine veins were sacrificed. The specimen was marked levels 12 and 3 and sent for pathologic analysis. The wound was irrigated. Cautery was used to complete hemostasis. A 15 French round drain was left in the wound and exited through separate stab incision and secured in place with suture. The platysma layer was reapproximated with interrupted 3-0 chromic suture. The skin was stapled.  #2 reexcision of tongue margin. Electrocautery was used to re excise the defect in the right lateral tongue. 1 cm tissues taken all around the scar. This was sent for pathologic evaluation and labeled with a suture anterior. Hemostasis was achieved in the wound using cautery and silk ties as needed. The defect was reapproximated with interrupted, inverted 3-0 Vicryl  sutures. The pharynx was suctioned of blood and secretions.  Patient was then awakened extubated and transferred to recovery in stable condition.   PATIENT DISPOSITION:  PACU - hemodynamically stable.

## 2011-09-06 NOTE — Anesthesia Procedure Notes (Signed)
Procedure Name: Intubation Date/Time: 09/06/2011 10:26 AM Performed by: Luster Landsberg Pre-anesthesia Checklist: Patient identified, Emergency Drugs available, Suction available, Patient being monitored and Timeout performed Patient Re-evaluated:Patient Re-evaluated prior to inductionOxygen Delivery Method: Circle system utilized Preoxygenation: Pre-oxygenation with 100% oxygen Intubation Type: IV induction Ventilation: Oral airway inserted - appropriate to patient size and Mask ventilation without difficulty Grade View: Grade I Tube type: Oral Tube size: 8.0 mm Number of attempts: 2 Airway Equipment and Method: Stylet and Video-laryngoscopy Placement Confirmation: ETT inserted through vocal cords under direct vision,  positive ETCO2 and breath sounds checked- equal and bilateral Secured at: 24 cm Tube secured with: Tape Dental Injury: Teeth and Oropharynx as per pre-operative assessment  Difficulty Due To: Difficulty was anticipated, Difficult Airway- due to anterior larynx and Difficult Airway- due to limited oral opening Comments: DVL x1 with MAC 4 - unable to visualize cords.  Clear view of epiglottis.  DVL with glidescope with grade I view.  ETT passed easily using glidescope stylet.

## 2011-09-07 NOTE — Progress Notes (Signed)
Patient ID: [name], male   DOB: 09/16/1980, 31 y.o.   MRN: [id number]  Feeling much better today. No more nausea and pain much less. Tongue moving well, flaps excellent. JP functioning, serosang drainage.  Stable post op, continue care. Heplock IV.

## 2011-09-08 MED ORDER — HYDROCODONE-ACETAMINOPHEN 5-325 MG PO TABS: 1.0000 | ORAL_TABLET | ORAL | Status: AC | PRN

## 2011-09-08 NOTE — Progress Notes (Signed)
Pt discharged to home accomp by wife and family via amb.  Pt given Rx for  Vicodin if needed and explained.  JP drain was taken out by Dr. Pollyann Kennedy prior to discharge.  Dry dressing placed over site.  Wound care explained to pt and wife and ointment given to pt to use at home.  Pt instructed on diet, to stay hydrated, to call if fever >101, bleeding, dehydration or any other problems, questions or concerns.  Pt is to make a follow up appt with Dr. Pollyann Kennedy for 09/16/11, # given.  No further questions about home self care.

## 2011-09-08 NOTE — Discharge Summary (Signed)
  Physician Discharge Summary  Patient ID: [name] MRN: [id number] DOB/AGE: 30/21/1982 30 y.o.  Admit date: 09/06/2011 Discharge date: 09/08/2011  Admission Diagnoses:Tongue cancer  Discharge Diagnoses:  Active Problems:  * No active hospital problems. *    Discharged Condition: good  Hospital Course: no complications  Consults: None  Significant Diagnostic Studies: none  Treatments: surgery: Right neck dissection and tongue mass re-excision  Discharge Exam: Blood pressure 120/77, pulse 86, temperature 97.7 F (36.5 C), temperature source Oral, resp. rate 18, height 6\' 2"  (1.88 m), weight 276 lb 10.8 oz (125.5 kg), SpO2 98.00%. PHYSICAL EXAM: Tongue healing nicely. JP removed, neck excellent.  Disposition: 01-Home or Self Care  Discharge Orders    Future Orders Please Complete By Expires   Diet - low sodium heart healthy      Increase activity slowly        Medication List  As of 09/08/2011 11:26 AM   TAKE these medications         HYDROcodone-acetaminophen 5-325 MG per tablet   Commonly known as: NORCO/VICODIN   Take 1-2 tablets by mouth every 4 (four) hours as needed.      simvastatin 10 MG tablet   Commonly known as: ZOCOR   Take 10 mg by mouth at bedtime.           Follow-up Information    Follow up with Serena Colonel, MD. Schedule an appointment as soon as possible for a visit on 09/16/2011.   Contact information:   [address], [address], [address] [place] [address] [phone]          Signed: Serena Colonel 09/08/2011, 11:26 AM

## 2011-09-09 ENCOUNTER — Encounter (HOSPITAL_COMMUNITY): Payer: Self-pay | Admitting: Otolaryngology

## 2011-10-21 ENCOUNTER — Other Ambulatory Visit (HOSPITAL_COMMUNITY): Payer: Self-pay | Admitting: Otolaryngology

## 2011-10-21 DIAGNOSIS — C029 Malignant neoplasm of tongue, unspecified: Secondary | ICD-10-CM

## 2011-11-06 ENCOUNTER — Encounter (HOSPITAL_COMMUNITY)
Admission: RE | Admit: 2011-11-06 | Discharge: 2011-11-06 | Disposition: A | Discharge status: Home / Self Care | Payer: 59 | Source: Ambulatory Visit | Attending: Otolaryngology | Admitting: Otolaryngology

## 2011-11-06 DIAGNOSIS — C029 Malignant neoplasm of tongue, unspecified: Secondary | ICD-10-CM

## 2011-11-06 DIAGNOSIS — C770 Secondary and unspecified malignant neoplasm of lymph nodes of head, face and neck: Secondary | ICD-10-CM | POA: Insufficient documentation

## 2011-11-06 LAB — GLUCOSE, CAPILLARY: Glucose-Capillary: 89 mg/dL (ref 70–99)

## 2011-11-06 MED ORDER — FLUDEOXYGLUCOSE F - 18 (FDG) INJECTION
17.6000 | Freq: Once | INTRAVENOUS | Status: AC | PRN
  Administered 2011-11-06: 17.6 via INTRAVENOUS

## 2011-11-22 ENCOUNTER — Other Ambulatory Visit: Payer: Self-pay | Admitting: Otolaryngology

## 2011-11-22 DIAGNOSIS — C029 Malignant neoplasm of tongue, unspecified: Secondary | ICD-10-CM

## 2011-11-22 DIAGNOSIS — C770 Secondary and unspecified malignant neoplasm of lymph nodes of head, face and neck: Secondary | ICD-10-CM

## 2011-11-25 ENCOUNTER — Ambulatory Visit
Admission: RE | Admit: 2011-11-25 | Discharge: 2011-11-25 | Disposition: A | Discharge status: Home / Self Care | Payer: 59 | Source: Ambulatory Visit | Attending: Otolaryngology | Admitting: Otolaryngology

## 2011-11-25 DIAGNOSIS — C029 Malignant neoplasm of tongue, unspecified: Secondary | ICD-10-CM

## 2011-11-25 DIAGNOSIS — C770 Secondary and unspecified malignant neoplasm of lymph nodes of head, face and neck: Secondary | ICD-10-CM

## 2011-11-25 MED ORDER — IOHEXOL 300 MG/ML  SOLN
75.0000 mL | Freq: Once | INTRAMUSCULAR | Status: AC | PRN
  Administered 2011-11-25: 75 mL via INTRAVENOUS

## 2011-11-29 ENCOUNTER — Other Ambulatory Visit (HOSPITAL_COMMUNITY): Payer: Self-pay | Admitting: Otolaryngology

## 2011-11-29 DIAGNOSIS — C029 Malignant neoplasm of tongue, unspecified: Secondary | ICD-10-CM

## 2011-12-03 ENCOUNTER — Other Ambulatory Visit: Payer: Self-pay | Admitting: Radiology

## 2011-12-03 ENCOUNTER — Encounter (HOSPITAL_COMMUNITY): Payer: Self-pay | Admitting: Pharmacy Technician

## 2011-12-05 ENCOUNTER — Encounter (HOSPITAL_COMMUNITY): Payer: Self-pay

## 2011-12-05 ENCOUNTER — Ambulatory Visit (HOSPITAL_COMMUNITY)
Admission: RE | Admit: 2011-12-05 | Discharge: 2011-12-05 | Disposition: A | Discharge status: Home / Self Care | Payer: 59 | Source: Ambulatory Visit | Attending: Otolaryngology | Admitting: Otolaryngology

## 2011-12-05 DIAGNOSIS — C029 Malignant neoplasm of tongue, unspecified: Secondary | ICD-10-CM | POA: Insufficient documentation

## 2011-12-05 LAB — CBC
HCT: 42 % (ref 39.0–52.0)
Hemoglobin: 15.1 g/dL (ref 13.0–17.0)
WBC: 8.9 10*3/uL (ref 4.0–10.5)

## 2011-12-05 LAB — PROTIME-INR
INR: 1.08 (ref 0.00–1.49)
Prothrombin Time: 13.9 seconds (ref 11.6–15.2)

## 2011-12-05 LAB — APTT: aPTT: 38 seconds — ABNORMAL HIGH (ref 24–37)

## 2011-12-05 MED ORDER — FENTANYL CITRATE 0.05 MG/ML IJ SOLN
INTRAMUSCULAR | Status: AC
  Filled 2011-12-05: qty 2

## 2011-12-05 MED ORDER — FENTANYL CITRATE 0.05 MG/ML IJ SOLN
INTRAMUSCULAR | Status: DC | PRN
  Administered 2011-12-05: 50 ug via INTRAVENOUS

## 2011-12-05 MED ORDER — SODIUM CHLORIDE 0.9 % IV SOLN: Freq: Once | INTRAVENOUS | Status: DC

## 2011-12-05 MED ORDER — MIDAZOLAM HCL 2 MG/2ML IJ SOLN
INTRAMUSCULAR | Status: AC
  Filled 2011-12-05: qty 6

## 2011-12-05 MED ORDER — MIDAZOLAM HCL 2 MG/2ML IJ SOLN
INTRAMUSCULAR | Status: DC | PRN
  Administered 2011-12-05: 2 mg via INTRAVENOUS

## 2011-12-05 NOTE — Procedures (Signed)
Successful LT NECK LYMPH NODE 18 G CORE BX NO COMP STABLE PATH PENDING FULL REPORT IN PACS

## 2011-12-05 NOTE — H&P (Signed)
[name] is an 31 y.o. male.   Chief Complaint: tongue ca dx 07/2011 Lesion removal and lymph removal rt neck +PET left neck lymph node Scheduled now for biopsy HPI: high trig; gout; tongue ca  Past Medical History  Diagnosis Date  . High triglycerides   . Cancer     tongue cancer  . Gout   . Chronic kidney disease     Kidney Stone  . Staph skin infection     Elbow- resolved.      Past Surgical History  Procedure Date  . Mouth surgery     biopsy- (08/06/2011 Dr. Barbette Merino), also had tissue graft (oral) 2010  . Glossectomy   . Glossectomy 08/22/2011    Procedure: GLOSSECTOMY;  Surgeon: Serena Colonel, MD;  Location: [facility] OR;  Service: ENT;  Laterality: Right;  WITH FROZEN SECTION  . Glossectomy 09/06/2011    Procedure: GLOSSECTOMY;  Surgeon: Serena Colonel, MD;  Location: [facility] OR;  Service: ENT;  Laterality: Right;  Right Tongue Re-Excision  . Radical neck dissection 09/06/2011    Procedure: RADICAL NECK DISSECTION;  Surgeon: Serena Colonel, MD;  Location: [facility] OR;  Service: ENT;  Laterality: Right;  Right Modified Neck Dissection     No family history on file. Social History:  reports that he quit smoking about 3 months ago. He does not have any smokeless tobacco history on file. He reports that he does not drink alcohol or use illicit drugs.  Allergies: No Known Allergies   (Not in a hospital admission)  No results found for this or any previous visit (from the past 48 hour(s)). No results found.  Review of Systems  Constitutional: Negative for fever and weight loss.  HENT: Positive for neck pain.   Respiratory: Negative for cough.   Cardiovascular: Negative for chest pain.  Gastrointestinal: Negative for nausea, vomiting and abdominal pain.  Neurological: Negative for weakness and headaches.    Blood pressure 125/73, pulse 74, temperature 97.2 F (36.2 C), temperature source Oral, resp. rate 18, height 6\' 2"  (1.88 m), weight 260 lb (117.935 kg), SpO2 96.00%. Physical Exam    Constitutional: He is oriented to person, place, and time. He appears well-nourished.  HENT:       Rt tongue scar  Neck:       Scar rt neck from LN removal  Cardiovascular: Normal rate, regular rhythm and normal heart sounds.   No murmur heard. Respiratory: Effort normal and breath sounds normal. He has no wheezes.  GI: Soft. Bowel sounds are normal. There is no tenderness.  Musculoskeletal: Normal range of motion.  Neurological: He is alert and oriented to person, place, and time.  Skin: Skin is dry.  Psychiatric: He has a normal mood and affect. His behavior is normal. Judgment and thought content normal.     Assessment/Plan Tongue ca dx 7/13 +PET left neck LN For bx today Pt aware of procedure benefits and risks and agreeable to proceed Consent signed and in chart  Flora Parks A 12/05/2011, 1:59 PM

## 2012-02-29 HISTORY — PX: GASTROSTOMY W/ FEEDING TUBE: SUR642

## 2012-03-17 ENCOUNTER — Encounter (HOSPITAL_COMMUNITY): Payer: Self-pay | Admitting: Pharmacy Technician

## 2012-03-19 NOTE — H&P (Signed)
Assessment   Tongue mass (784.2) (R22.0). Discussed  He's had intermittent sores but he hasn't had much attention to it. On exam today there is a firm small ulceration right in the center of the scar on the right lateral tongue. No palpable adenopathy. I very concerned about this and recommended exam under anesthesia with biopsy and frozen section and possible hemiglossectomy. Reason For Visit  Follow up cancer. Allergies  No Known Drug Allergies. Current Meds  Wellbutrin TABS (BuPROPion HCl);; RPT Zocor TABS (Simvastatin);; RPT. 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 PSH  Glossectomy 25Jul2013; partial right. Family Hx  Family history of diabetes mellitus (V18.0) (Z83.3) Hearing Loss (V19.2) Reported Family History Of Allergies Rheumatoid Arthritis: Mother. Personal Hx  Being A Social Drinker Caffeine use; 3 cups daily Current smoker (305.1) (F17.200);  * quit 07/13, 16 Mar 2012. ROS  Systemic: Not feeling tired (fatigue).  No fever, no night sweats, and no recent weight loss. Head: No headache. Eyes: No eye symptoms. Otolaryngeal: No hearing loss, no earache, the ears do not feel pressured , stopped up, the ears do not feel full, no tinnitus, and no purulent nasal discharge.  No nasal passage blockage (stuffiness), no snoring, no sneezing, no hoarseness, and no sore throat. Cardiovascular: No chest pain or discomfort  and no palpitations. Pulmonary: No dyspnea, no cough, and no wheezing. Gastrointestinal: No dysphagia  and no heartburn.  No nausea, no abdominal pain, and no melena.  No diarrhea. Genitourinary: No dysuria. Endocrine: No muscle weakness. Musculoskeletal: No calf muscle cramps, no arthralgias, and no soft tissue swelling. Neurological: No dizziness, no fainting, no tingling, and no  numbness. Psychological: No anxiety  and no depression. Skin: No rash. Signature  Electronically signed by : Serena Colonel  M.D.; 03/16/2012 2:18 PM EST.

## 2012-03-20 ENCOUNTER — Encounter (HOSPITAL_COMMUNITY): Payer: Self-pay

## 2012-03-22 MED ORDER — CEFAZOLIN SODIUM-DEXTROSE 2-3 GM-% IV SOLR
2.0000 g | INTRAVENOUS | Status: DC
  Administered 2012-03-23: 3 g via INTRAVENOUS
  Filled 2012-03-22: qty 50

## 2012-03-23 ENCOUNTER — Encounter (HOSPITAL_COMMUNITY): Payer: Self-pay | Admitting: Certified Registered"

## 2012-03-23 ENCOUNTER — Inpatient Hospital Stay (HOSPITAL_COMMUNITY)
Admission: RE | Admit: 2012-03-23 | Discharge: 2012-03-27 | DRG: 012 | Disposition: A | Discharge status: Home / Self Care | Payer: 59 | Source: Ambulatory Visit | Attending: Otolaryngology | Admitting: Otolaryngology

## 2012-03-23 ENCOUNTER — Encounter (HOSPITAL_COMMUNITY)
Admission: RE | Disposition: A | Discharge status: Home / Self Care | Payer: Self-pay | Source: Ambulatory Visit | Attending: Otolaryngology

## 2012-03-23 ENCOUNTER — Ambulatory Visit (HOSPITAL_COMMUNITY): Payer: 59 | Admitting: Certified Registered"

## 2012-03-23 DIAGNOSIS — K08109 Complete loss of teeth, unspecified cause, unspecified class: Secondary | ICD-10-CM | POA: Diagnosis present

## 2012-03-23 DIAGNOSIS — F329 Major depressive disorder, single episode, unspecified: Secondary | ICD-10-CM | POA: Diagnosis present

## 2012-03-23 DIAGNOSIS — K219 Gastro-esophageal reflux disease without esophagitis: Secondary | ICD-10-CM | POA: Diagnosis present

## 2012-03-23 DIAGNOSIS — Z6837 Body mass index (BMI) 37.0-37.9, adult: Secondary | ICD-10-CM

## 2012-03-23 DIAGNOSIS — C023 Malignant neoplasm of anterior two-thirds of tongue, part unspecified: Secondary | ICD-10-CM

## 2012-03-23 DIAGNOSIS — C770 Secondary and unspecified malignant neoplasm of lymph nodes of head, face and neck: Secondary | ICD-10-CM | POA: Diagnosis present

## 2012-03-23 DIAGNOSIS — K1321 Leukoplakia of oral mucosa, including tongue: Secondary | ICD-10-CM | POA: Diagnosis present

## 2012-03-23 DIAGNOSIS — E669 Obesity, unspecified: Secondary | ICD-10-CM | POA: Diagnosis present

## 2012-03-23 DIAGNOSIS — J449 Chronic obstructive pulmonary disease, unspecified: Secondary | ICD-10-CM | POA: Diagnosis present

## 2012-03-23 DIAGNOSIS — M109 Gout, unspecified: Secondary | ICD-10-CM | POA: Diagnosis present

## 2012-03-23 DIAGNOSIS — K0510 Chronic gingivitis, plaque induced: Secondary | ICD-10-CM | POA: Diagnosis present

## 2012-03-23 DIAGNOSIS — M2629 Other anomalies of dental arch relationship: Secondary | ICD-10-CM | POA: Diagnosis present

## 2012-03-23 DIAGNOSIS — C029 Malignant neoplasm of tongue, unspecified: Principal | ICD-10-CM

## 2012-03-23 DIAGNOSIS — R259 Unspecified abnormal involuntary movements: Secondary | ICD-10-CM | POA: Diagnosis present

## 2012-03-23 DIAGNOSIS — N289 Disorder of kidney and ureter, unspecified: Secondary | ICD-10-CM | POA: Diagnosis present

## 2012-03-23 DIAGNOSIS — F3289 Other specified depressive episodes: Secondary | ICD-10-CM | POA: Diagnosis present

## 2012-03-23 DIAGNOSIS — Z87891 Personal history of nicotine dependence: Secondary | ICD-10-CM

## 2012-03-23 DIAGNOSIS — K006 Disturbances in tooth eruption: Secondary | ICD-10-CM | POA: Diagnosis present

## 2012-03-23 DIAGNOSIS — J4489 Other specified chronic obstructive pulmonary disease: Secondary | ICD-10-CM | POA: Diagnosis present

## 2012-03-23 HISTORY — DX: Gastro-esophageal reflux disease without esophagitis: K21.9

## 2012-03-23 HISTORY — PX: TRACHEOSTOMY TUBE PLACEMENT: SHX814

## 2012-03-23 HISTORY — DX: Other specified postprocedural states: Z98.890

## 2012-03-23 HISTORY — DX: Other specified postprocedural states: R11.2

## 2012-03-23 HISTORY — PX: HEMIGLOSSECTOMY: SHX1740

## 2012-03-23 LAB — CBC
MCH: 30 pg (ref 26.0–34.0)
MCHC: 35.4 g/dL (ref 30.0–36.0)
Platelets: 222 10*3/uL (ref 150–400)
RDW: 12.4 % (ref 11.5–15.5)

## 2012-03-23 LAB — BASIC METABOLIC PANEL
Calcium: 9.3 mg/dL (ref 8.4–10.5)
GFR calc Af Amer: >90 mL/min (ref >90)
GFR calc non Af Amer: >90 mL/min (ref >90)
Sodium: 137 mEq/L (ref 135–145)

## 2012-03-23 LAB — SURGICAL PCR SCREEN: MRSA, PCR: NEGATIVE

## 2012-03-23 SURGERY — HEMIGLOSSECTOMY
Anesthesia: General | Site: Neck | Wound class: Clean Contaminated

## 2012-03-23 MED ORDER — HYDROCODONE-ACETAMINOPHEN 7.5-325 MG/15ML PO SOLN
10.0000 mL | ORAL | Status: DC | PRN
  Filled 2012-03-23: qty 15

## 2012-03-23 MED ORDER — MIDAZOLAM HCL 2 MG/2ML IJ SOLN: 1.0000 mg | INTRAMUSCULAR | Status: DC | PRN

## 2012-03-23 MED ORDER — ROCURONIUM BROMIDE 100 MG/10ML IV SOLN
INTRAVENOUS | Status: DC | PRN
  Administered 2012-03-23: 40 mg via INTRAVENOUS
  Administered 2012-03-23 (×5): 10 mg via INTRAVENOUS

## 2012-03-23 MED ORDER — BUPROPION HCL ER (SR) 150 MG PO TB12
150.0000 mg | ORAL_TABLET | Freq: Two times a day (BID) | ORAL | Status: DC
  Administered 2012-03-26: 150 mg via ORAL
  Filled 2012-03-23 (×10): qty 1

## 2012-03-23 MED ORDER — DEXTROSE 5 % IV SOLN
3.0000 g | Freq: Once | INTRAVENOUS | Status: DC
  Filled 2012-03-23: qty 3000

## 2012-03-23 MED ORDER — DEXTROSE-NACL 5-0.9 % IV SOLN
INTRAVENOUS | Status: DC
  Administered 2012-03-23 – 2012-03-27 (×5): via INTRAVENOUS

## 2012-03-23 MED ORDER — LACTATED RINGERS IV SOLN
INTRAVENOUS | Status: DC | PRN
  Administered 2012-03-23 (×2): via INTRAVENOUS

## 2012-03-23 MED ORDER — PROMETHAZINE HCL 25 MG PO TABS: 25.0000 mg | ORAL_TABLET | Freq: Four times a day (QID) | ORAL | Status: DC | PRN

## 2012-03-23 MED ORDER — SCOPOLAMINE 1 MG/3DAYS TD PT72
MEDICATED_PATCH | TRANSDERMAL | Status: AC
  Administered 2012-03-23: 1 via TRANSDERMAL
  Filled 2012-03-23: qty 1

## 2012-03-23 MED ORDER — 0.9 % SODIUM CHLORIDE (POUR BTL) OPTIME
TOPICAL | Status: DC | PRN
  Administered 2012-03-23: 1000 mL

## 2012-03-23 MED ORDER — HYDROMORPHONE HCL PF 1 MG/ML IJ SOLN
0.2500 mg | INTRAMUSCULAR | Status: DC | PRN
  Administered 2012-03-23 (×2): 0.5 mg via INTRAVENOUS

## 2012-03-23 MED ORDER — ARTIFICIAL TEARS OP OINT
TOPICAL_OINTMENT | OPHTHALMIC | Status: DC | PRN
  Administered 2012-03-23: 1 via OPHTHALMIC

## 2012-03-23 MED ORDER — LIDOCAINE-EPINEPHRINE 1 %-1:100000 IJ SOLN
INTRAMUSCULAR | Status: AC
  Filled 2012-03-23: qty 1

## 2012-03-23 MED ORDER — PROMETHAZINE HCL 25 MG/ML IJ SOLN: 6.2500 mg | INTRAMUSCULAR | Status: DC | PRN

## 2012-03-23 MED ORDER — PROPOFOL 10 MG/ML IV BOLUS
INTRAVENOUS | Status: DC | PRN
  Administered 2012-03-23: 350 mg via INTRAVENOUS
  Administered 2012-03-23: 50 mg via INTRAVENOUS

## 2012-03-23 MED ORDER — OXYMETAZOLINE HCL 0.05 % NA SOLN
NASAL | Status: AC
  Filled 2012-03-23: qty 15

## 2012-03-23 MED ORDER — HYDROMORPHONE HCL PF 1 MG/ML IJ SOLN
INTRAMUSCULAR | Status: AC
  Filled 2012-03-23: qty 1

## 2012-03-23 MED ORDER — PROMETHAZINE HCL 25 MG RE SUPP: 25.0000 mg | Freq: Four times a day (QID) | RECTAL | Status: DC | PRN

## 2012-03-23 MED ORDER — MORPHINE SULFATE 2 MG/ML IJ SOLN
2.0000 mg | INTRAMUSCULAR | Status: DC | PRN
  Administered 2012-03-23: 2 mg via INTRAVENOUS
  Administered 2012-03-23: 4 mg via INTRAVENOUS
  Administered 2012-03-24: 2 mg via INTRAVENOUS
  Administered 2012-03-24: 4 mg via INTRAVENOUS
  Filled 2012-03-23 (×2): qty 1
  Filled 2012-03-23 (×2): qty 2

## 2012-03-23 MED ORDER — GLYCOPYRROLATE 0.2 MG/ML IJ SOLN
INTRAMUSCULAR | Status: DC | PRN
  Administered 2012-03-23: .8 mg via INTRAVENOUS

## 2012-03-23 MED ORDER — LIDOCAINE HCL (CARDIAC) 20 MG/ML IV SOLN
INTRAVENOUS | Status: DC | PRN
  Administered 2012-03-23: 80 mg via INTRAVENOUS

## 2012-03-23 MED ORDER — CLINDAMYCIN PHOSPHATE 300 MG/50ML IV SOLN
300.0000 mg | Freq: Three times a day (TID) | INTRAVENOUS | Status: DC
  Administered 2012-03-23 – 2012-03-26 (×9): 300 mg via INTRAVENOUS
  Filled 2012-03-23 (×12): qty 50

## 2012-03-23 MED ORDER — SUCCINYLCHOLINE CHLORIDE 20 MG/ML IJ SOLN
INTRAMUSCULAR | Status: DC | PRN
  Administered 2012-03-23: 140 mg via INTRAVENOUS

## 2012-03-23 MED ORDER — FENTANYL CITRATE 0.05 MG/ML IJ SOLN: 50.0000 ug | Freq: Once | INTRAMUSCULAR | Status: DC

## 2012-03-23 MED ORDER — FENTANYL CITRATE 0.05 MG/ML IJ SOLN
INTRAMUSCULAR | Status: DC | PRN
  Administered 2012-03-23 (×6): 50 ug via INTRAVENOUS
  Administered 2012-03-23: 100 ug via INTRAVENOUS

## 2012-03-23 MED ORDER — DEXAMETHASONE SODIUM PHOSPHATE 10 MG/ML IJ SOLN
INTRAMUSCULAR | Status: DC | PRN
  Administered 2012-03-23: 8 mg via INTRAVENOUS

## 2012-03-23 MED ORDER — MIDAZOLAM HCL 5 MG/5ML IJ SOLN
INTRAMUSCULAR | Status: DC | PRN
  Administered 2012-03-23 (×2): 2 mg via INTRAVENOUS

## 2012-03-23 MED ORDER — LIDOCAINE-EPINEPHRINE 1 %-1:100000 IJ SOLN
INTRAMUSCULAR | Status: DC | PRN
  Administered 2012-03-23: 10 mL

## 2012-03-23 MED ORDER — MUPIROCIN 2 % EX OINT
TOPICAL_OINTMENT | CUTANEOUS | Status: AC
  Administered 2012-03-23: 1 via NASAL
  Filled 2012-03-23: qty 22

## 2012-03-23 MED ORDER — CEFAZOLIN SODIUM-DEXTROSE 2-3 GM-% IV SOLR
INTRAVENOUS | Status: AC
  Filled 2012-03-23: qty 50

## 2012-03-23 MED ORDER — NEOSTIGMINE METHYLSULFATE 1 MG/ML IJ SOLN
INTRAMUSCULAR | Status: DC | PRN
  Administered 2012-03-23: 5 mg via INTRAVENOUS

## 2012-03-23 MED ORDER — CEFAZOLIN SODIUM 1-5 GM-% IV SOLN
INTRAVENOUS | Status: AC
  Filled 2012-03-23: qty 50

## 2012-03-23 MED ORDER — SIMVASTATIN 10 MG PO TABS
10.0000 mg | ORAL_TABLET | Freq: Every day | ORAL | Status: DC
  Filled 2012-03-23 (×5): qty 1

## 2012-03-23 SURGICAL SUPPLY — 55 items
APPLIER CLIP 9.375 SM OPEN (CLIP)
ATTRACTOMAT 16X20 MAGNETIC DRP (DRAPES) IMPLANT
BLADE SURG 10 STRL SS (BLADE) ×3 IMPLANT
BLADE SURG 15 STRL LF DISP TIS (BLADE) ×2 IMPLANT
BLADE SURG 15 STRL SS (BLADE) ×1
CANISTER SUCTION 2500CC (MISCELLANEOUS) ×3 IMPLANT
CLEANER TIP ELECTROSURG 2X2 (MISCELLANEOUS) ×3 IMPLANT
CLIP APPLIE 9.375 SM OPEN (CLIP) IMPLANT
CLOTH BEACON ORANGE TIMEOUT ST (SAFETY) ×3 IMPLANT
CONT SPEC 4OZ CLIKSEAL STRL BL (MISCELLANEOUS) IMPLANT
CONT SPEC STER OR (MISCELLANEOUS) ×6 IMPLANT
COVER SURGICAL LIGHT HANDLE (MISCELLANEOUS) ×3 IMPLANT
COVER TABLE BACK 60X90 (DRAPES) IMPLANT
DRAIN CHANNEL 15F RND FF W/TCR (WOUND CARE) IMPLANT
DRAPE ORTHO SPLIT 77X108 STRL (DRAPES) ×1
DRAPE PROXIMA HALF (DRAPES) ×3 IMPLANT
DRAPE SURG ORHT 6 SPLT 77X108 (DRAPES) ×2 IMPLANT
ELECT COATED BLADE 2.86 ST (ELECTRODE) ×3 IMPLANT
ELECT REM PT RETURN 9FT ADLT (ELECTROSURGICAL) ×3
ELECTRODE REM PT RTRN 9FT ADLT (ELECTROSURGICAL) ×2 IMPLANT
EVACUATOR SILICONE 100CC (DRAIN) IMPLANT
GAUZE SPONGE 4X4 16PLY XRAY LF (GAUZE/BANDAGES/DRESSINGS) ×3 IMPLANT
GLOVE ECLIPSE 7.5 STRL STRAW (GLOVE) ×3 IMPLANT
GLOVE SURG SS PI 6.5 STRL IVOR (GLOVE) ×3 IMPLANT
GOWN STRL NON-REIN LRG LVL3 (GOWN DISPOSABLE) ×6 IMPLANT
HOLDER TRACH TUBE VELCRO 19.5 (MISCELLANEOUS) ×3 IMPLANT
KIT BASIN OR (CUSTOM PROCEDURE TRAY) ×3 IMPLANT
KIT ROOM TURNOVER OR (KITS) ×3 IMPLANT
LOCATOR NERVE 3 VOLT (DISPOSABLE) IMPLANT
NS IRRIG 1000ML POUR BTL (IV SOLUTION) ×3 IMPLANT
PAD ARMBOARD 7.5X6 YLW CONV (MISCELLANEOUS) ×6 IMPLANT
PENCIL FOOT CONTROL (ELECTRODE) ×3 IMPLANT
SOLUTION BETADINE 4OZ (MISCELLANEOUS) ×3 IMPLANT
SPONGE DRAIN TRACH 4X4 STRL 2S (GAUZE/BANDAGES/DRESSINGS) ×3 IMPLANT
SPONGE GAUZE 4X4 12PLY (GAUZE/BANDAGES/DRESSINGS) ×3 IMPLANT
SPONGE INTESTINAL PEANUT (DISPOSABLE) ×3 IMPLANT
SPONGE LAP 18X18 X RAY DECT (DISPOSABLE) IMPLANT
STAPLER VISISTAT 35W (STAPLE) ×3 IMPLANT
SUT CHROMIC 2 0 SH (SUTURE) ×3 IMPLANT
SUT CHROMIC 3 0 PS 2 (SUTURE) ×3 IMPLANT
SUT ETHILON 3 0 PS 1 (SUTURE) ×3 IMPLANT
SUT SILK 2 0 FS (SUTURE) IMPLANT
SUT SILK 3 0 SH CR/8 (SUTURE) ×3 IMPLANT
SUT SILK 4 0 REEL (SUTURE) ×3 IMPLANT
SUT VIC AB 3-0 SH 27 (SUTURE) ×5
SUT VIC AB 3-0 SH 27X BRD (SUTURE) ×6 IMPLANT
SUT VIC AB 3-0 SH 27XBRD (SUTURE) ×4 IMPLANT
SUT VIC AB 4-0 RB1 18 (SUTURE) IMPLANT
TOWEL OR 17X24 6PK STRL BLUE (TOWEL DISPOSABLE) IMPLANT
TOWEL OR 17X26 10 PK STRL BLUE (TOWEL DISPOSABLE) ×3 IMPLANT
TRAY ENT MC OR (CUSTOM PROCEDURE TRAY) ×3 IMPLANT
TRAY FOLEY CATH 14FRSI W/METER (CATHETERS) IMPLANT
TUBE CONNECTING 12X1/4 (SUCTIONS) ×3 IMPLANT
TUBE TRACH SHILEY  6 DIST  CUF (TUBING) ×3 IMPLANT
WATER STERILE IRR 1000ML POUR (IV SOLUTION) ×3 IMPLANT

## 2012-03-23 NOTE — Interval H&P Note (Signed)
History and Physical Interval Note:  03/23/2012 7:17 AM  [name]  has presented today for surgery, with the diagnosis of TONGUE MASS  The various methods of treatment have been discussed with the patient and family. After consideration of risks, benefits and other options for treatment, the patient has consented to  Procedure(s): BIOPSY OF TONGUE WITH FROZEN SECTION/POSSIBLE HEMIGLOSSECTOMY (N/A) as a surgical intervention .  The patient's history has been reviewed, patient examined, no change in status, stable for surgery.  I have reviewed the patient's chart and labs.  Questions were answered to the patient's satisfaction.     [name]

## 2012-03-23 NOTE — Transfer of Care (Signed)
Immediate Anesthesia Transfer of Care Note  Patient: [name]  Procedure(s) Performed: Procedure(s): BIOPSY OF TONGUE WITH FROZEN SECTION AND RIGHT HEMIGLOSSECTOMY (N/A) TRACHEOSTOMY (N/A)  Patient Location: PACU  Anesthesia Type:General  Level of Consciousness: awake, alert  and oriented  Airway & Oxygen Therapy: Patient Spontanous Breathing and Patient connected to tracheostomy mask oxygen  Post-op Assessment: Report given to PACU RN, Post -op Vital signs reviewed and stable and Patient moving all extremities X 4  Post vital signs: Reviewed and stable  Complications: No apparent anesthesia complications

## 2012-03-23 NOTE — OR Nursing (Signed)
Right Hemiglossectomy ended at 0949.  Tracheostomy started at 0951.

## 2012-03-23 NOTE — Anesthesia Postprocedure Evaluation (Signed)
  Anesthesia Post-op Note  Patient: [name]  Procedure(s) Performed: Procedure(s): BIOPSY OF TONGUE WITH FROZEN SECTION AND RIGHT HEMIGLOSSECTOMY (N/A) TRACHEOSTOMY (N/A)  Patient Location: PACU  Anesthesia Type:General  Level of Consciousness: awake  Airway and Oxygen Therapy: Patient Spontanous Breathing  Post-op Pain: mild  Post-op Assessment: Post-op Vital signs reviewed, Patient's Cardiovascular Status Stable, Respiratory Function Stable, Patent Airway, No signs of Nausea or vomiting and Pain level controlled  Post-op Vital Signs: stable  Complications: No apparent anesthesia complications

## 2012-03-23 NOTE — Op Note (Signed)
03/23/2012 10:13 AM   PATIENT:  [name], 32 y.o. male  PRE-OPERATIVE DIAGNOSIS:  TONGUE MASS  POST-OPERATIVE DIAGNOSIS:  Tongue Mass   PROCEDURE:  Procedure(s): HEMIGLOSSECTOMY, TRACHEOSTOMY  SURGEON:  Surgeon(s): Susy Frizzle, MD  ASSISTANTS: none   ANESTHESIA:   general  EBL: 300  DRAINS: none   LOCAL MEDICATIONS USED:  NONE  COUNTS CORRECT:  YES  PROCEDURE DETAILS: Patient was taken to the operating room and placed on the operating table in the supine position.  Following induction of general endotracheal anesthesia, patient was draped in a standard fashion. A rubber bite guard was used on the left side of the mouth. A towel clamp was used to grasp the tip of the tongue to retract the tongue to the left side. An ulcerated lesion of the right lateral tongue was identified, approximately 1/2 cm in greatest dimension. By palpation, there was a slightly submucosal indurated component which did not extend beyond the ulceration. A biopsy was taken and sent for frozen section evaluation. This was positive for squamous cell carcinoma, invasive. Decision was then made to perform hemiglossectomy. The mucosal incision were marked with electrocautery starting at the tip of the tongue running posteriorly down the midline along the dorsum, turning laterally about 1/2 cm posterior to the limits of the ulceration and following along the lateral floor of mouth also about 1/2 cm lateral to the ulceration, returning back to the anterior midline. Cautery was used to perform the entire dissection. The lingual artery was identified and ligated between clamps, divided, after it was nicked and there was a fair amount of brisk bleeding. The lingual nerve was identified and ligated between clamps and divided. The remainder of the sides the dissection was through the muscular tissue. The sublingual gland was also identified and the majority was included with the specimen. The tip of the tongue was marked  with a marking suture and the specimen was sent for frozen section analysis the margins which were all clear. The tip was folded back along the right side and sutured to the posterior extent of the defect using an interrupted 3-0 Vicryl suture. The dorsal and ventral closure was completed in a similar fashion.  He was a somewhat difficult intubation preop, given the extent of the glossectomy, tracheostomy was decided to be the appropriate measure as a precaution. The patient was previously orally intubated. The neck was prepped and draped in a standard fashion. A vertical incision was created just above the sternal notch using electrocautery. The midline fascia was divided. The isthmus of the thyroid was reflected superiorly and the upper trachea was exposed. A tracheotomy was created between the second and third tracheal rings in a horizontal fashion. A lower tracheal flap was created with scissors and the flap was sutured to the cervical skin using 2-0 chromic suture. The orotracheal tube was removed. The #8 Shiley tracheostomy tube was placed without difficulty and the cuff was inflated. The shield was secured to the neck using a Velcro straps and nylon suture. The patient was then transferred back to the intensive care unit in critical condition.  PLAN OF CARE: Transfer to ICU  PATIENT DISPOSITION:  ICU - hemodynamically stable.

## 2012-03-23 NOTE — Anesthesia Preprocedure Evaluation (Addendum)
Anesthesia Evaluation  Patient identified by MRN, date of birth, ID band Patient awake    Reviewed: Allergy & Precautions, H&P , NPO status , Patient's Chart, lab work & pertinent test results  History of Anesthesia Complications (+) PONV  Airway Mallampati: I TM Distance: >3 FB Neck ROM: Full    Dental  (+) Teeth Intact and Dental Advisory Given   Pulmonary COPDCurrent Smoker,  breath sounds clear to auscultation        Cardiovascular Rhythm:Regular Rate:Normal     Neuro/Psych Depression    GI/Hepatic GERD-  ,  Endo/Other    Renal/GU Renal disease     Musculoskeletal   Abdominal (+) + obese,   Peds  Hematology   Anesthesia Other Findings   Reproductive/Obstetrics                           Anesthesia Physical Anesthesia Plan  ASA: III  Anesthesia Plan: General   Post-op Pain Management:    Induction: Intravenous  Airway Management Planned: Oral ETT  Additional Equipment:   Intra-op Plan:   Post-operative Plan: Extubation in OR  Informed Consent: I have reviewed the patients History and Physical, chart, labs and discussed the procedure including the risks, benefits and alternatives for the proposed anesthesia with the patient or authorized representative who has indicated his/her understanding and acceptance.     Plan Discussed with: CRNA, Surgeon and Anesthesiologist  Anesthesia Plan Comments:        Anesthesia Quick Evaluation

## 2012-03-23 NOTE — Progress Notes (Signed)
Doing well, awake and alert, no complaints.  Trach in place and stable, no bleeding. Surgical site looks good, no swelling, suture line intact.  Stable post op, continue overnight in ICU.

## 2012-03-24 ENCOUNTER — Encounter (HOSPITAL_COMMUNITY): Payer: Self-pay | Admitting: Otolaryngology

## 2012-03-24 MED ORDER — MORPHINE SULFATE 2 MG/ML IJ SOLN
2.0000 mg | INTRAMUSCULAR | Status: DC | PRN
  Administered 2012-03-24 – 2012-03-27 (×14): 2 mg via INTRAVENOUS
  Filled 2012-03-24 (×5): qty 1
  Filled 2012-03-24: qty 2
  Filled 2012-03-24 (×2): qty 1
  Filled 2012-03-24: qty 2
  Filled 2012-03-24 (×6): qty 1

## 2012-03-24 NOTE — Progress Notes (Signed)
UR Completed.  [name] [phone] 03/24/2012  

## 2012-03-24 NOTE — Progress Notes (Signed)
Patient transferred to [facility] Room 27. Alert and oriented. #6 Shiley trach and 28% trach collar. Oriented to room. Call bell in reach. Family at bedside.

## 2012-03-24 NOTE — Progress Notes (Signed)
Subjective: No complaints. Sitting up in chair.  Objective: Vital signs in last 24 hours: Temp:  [97.9 F (36.6 C)-99.5 F (37.5 C)] 99 F (37.2 C) (02/25 0810) Pulse Rate:  [59-103] 76 (02/25 0817) Resp:  [12-22] 22 (02/25 0817) BP: (84-131)/(40-114) 125/85 mmHg (02/25 0817) SpO2:  [93 %-100 %] 100 % (02/25 0817) FiO2 (%):  [28 %] 28 % (02/25 0800) Weight:  [290 lb 9.1 oz (131.8 kg)] 290 lb 9.1 oz (131.8 kg) (02/25 0600) Weight change:     Intake/Output from previous day: 02/24 0701 - 02/25 0700 In: 3350 [I.V.:3300; IV Piggyback:50] Out: 3700 [Urine:3350; Blood:350] Intake/Output this shift: Total I/O In: 75 [I.V.:75] Out: -   PHYSICAL EXAM: Trach stable, cuff deflated, able to phonate. Tongue closure looks healthy without hematoma.  Lab Results:  Recent Labs  03/23/12 0649  WBC 7.5  HGB 14.6  HCT 41.3  PLT 222   BMET  Recent Labs  03/23/12 0649  NA 137  K 4.1  CL 102  CO2 25  GLUCOSE 98  BUN 8  CREATININE 0.91  CALCIUM 9.3    Studies/Results: No results found.  Medications: I have reviewed the patient's current medications.  Assessment/Plan: Transfer to floor today. Start liquid and soft diet as tolerated. Change trach to cuffless tomorrow.   LOS: 1 day   Cecilio Ohlrich 03/24/2012, 8:44 AM

## 2012-03-24 NOTE — Progress Notes (Signed)
Patient unable to swallow sips of water or ice chips. Therefore unable to swallow p.o. pain meds. Dr. Pollyann Kennedy notified. Order for IV pain med.

## 2012-03-25 NOTE — Progress Notes (Signed)
Subjective: No new complaints. He has not been able to eat or drink anything.  Objective: Vital signs in last 24 hours: Temp:  [98.4 F (36.9 C)-100.4 F (38 C)] 98.4 F (36.9 C) (02/26 1000) Pulse Rate:  [87-97] 97 (02/26 1000) Resp:  [17-23] 18 (02/26 1000) BP: (121-140)/(73-85) 133/81 mmHg (02/26 1000) SpO2:  [95 %-99 %] 99 % (02/26 1000) FiO2 (%):  [25 %-28 %] 28 % (02/26 1000) Weight change:     Intake/Output from previous day: 02/25 0701 - 02/26 0700 In: 789 [I.V.:789] Out: 1050 [Urine:1050] Intake/Output this shift:    PHYSICAL EXAM: Oral closure healing nicely. Trach stable. Trach downsized to 4 cuffless. He is able to breathe with it plugged. He is able to phonate but is very reluctant to try and talk. I tried having him drink some water with a syringe and tube in the pharynx and he tolerates that without aspiration.  Lab Results:  Recent Labs  03/23/12 0649  WBC 7.5  HGB 14.6  HCT 41.3  PLT 222   BMET  Recent Labs  03/23/12 0649  NA 137  K 4.1  CL 102  CO2 25  GLUCOSE 98  BUN 8  CREATININE 0.91  CALCIUM 9.3    Studies/Results: No results found.  Medications: I have reviewed the patient's current medications.  Assessment/Plan: Stable, progressing nicely. 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 Start oral tube assisted feeding. Encouraged him to try talking as much as possible as that will speed up his recovery. Encouraged ambulating. Will cap the trach tomorrow and decanulate Friday. Discharge home when taking adequate PO.   LOS: 2 days   France Lusty 03/25/2012, 11:17 AM

## 2012-03-26 ENCOUNTER — Encounter (HOSPITAL_COMMUNITY): Payer: Self-pay | Admitting: Dentistry

## 2012-03-26 ENCOUNTER — Inpatient Hospital Stay (HOSPITAL_COMMUNITY): Payer: 59

## 2012-03-26 DIAGNOSIS — C021 Malignant neoplasm of border of tongue: Secondary | ICD-10-CM

## 2012-03-26 DIAGNOSIS — C023 Malignant neoplasm of anterior two-thirds of tongue, part unspecified: Secondary | ICD-10-CM

## 2012-03-26 DIAGNOSIS — Z0189 Encounter for other specified special examinations: Secondary | ICD-10-CM

## 2012-03-26 NOTE — Progress Notes (Signed)
No new complaints, he has been unable to swallow anything. Tach capped, no problems breathing, and he is talking much better. Tongue looks good.   Keep trach capped, decannulate tomorrow. Speech/swallow eval today.

## 2012-03-26 NOTE — Evaluation (Signed)
Clinical/Bedside Swallow Evaluation Patient Details  Name: [name] MRN: [id number] Date of Birth: [birth year]  Today's Date: 03/26/2012 Time:  -     Past Medical History:  Past Medical History  Diagnosis Date  . High triglycerides   . Gout   . Staph skin infection     Elbow- resolved.    . Complication of anesthesia   . PONV (postoperative nausea and vomiting)   . Depression     "patient quit smoking was put on wellbutrin to assist"  . GERD (gastroesophageal reflux disease)   . Chronic kidney disease     Kidney Stone  . Cancer     tongue cancer   Past Surgical History:  Past Surgical History  Procedure Laterality Date  . Mouth surgery      biopsy- (08/06/2011 Dr. Barbette Merino), also had tissue graft (oral) 2010  . Glossectomy    . Glossectomy  08/22/2011    Procedure: GLOSSECTOMY;  Surgeon: Serena Colonel, MD;  Location: [facility] OR;  Service: ENT;  Laterality: Right;  WITH FROZEN SECTION  . Glossectomy  09/06/2011    Procedure: GLOSSECTOMY;  Surgeon: Serena Colonel, MD;  Location: [facility] OR;  Service: ENT;  Laterality: Right;  Right Tongue Re-Excision  . Radical neck dissection  09/06/2011    Procedure: RADICAL NECK DISSECTION;  Surgeon: Serena Colonel, MD;  Location: [facility] OR;  Service: ENT;  Laterality: Right;  Right Modified Neck Dissection   . Hemiglossectomy N/A 03/23/2012    Procedure: BIOPSY OF TONGUE WITH FROZEN SECTION AND RIGHT HEMIGLOSSECTOMY;  Surgeon: Serena Colonel, MD;  Location: [facility] OR;  Service: ENT;  Laterality: N/A;  . Tracheostomy tube placement N/A 03/23/2012    Procedure: TRACHEOSTOMY;  Surgeon: Serena Colonel, MD;  Location: [facility] OR;  Service: ENT;  Laterality: N/A;   HPI:  32 y.o. male with hx of squamous cell carcinoma of tongue and partial glossectomy 07/2011, right neck dissection and tongue mass re-excision 08/2011, admitted 03/23/12 due to recurring mass requiring right hemiglossectomy and trach.  Today, pt's trach is capped in preparation for decannulation next date.  Pt not  eating/swallowing.    Assessment / Plan / Recommendation Clinical Impression  Pt is s/p hemiglossectomy with trach; likely decannulation tomorrow.  Pt with adequate mobility of hyolaryngeal musculature, submental muscles.  Hemiglossectomy interferes with bolus manipulation/propulsion into pharynx.  Pt is compensating well with use of a syrynge and catheter, with which he can bypass oral phase of swallow and deliver liquids directly to the pharynx.    Discussed with pt/wife basic nutrition, use of protein drinks, use of blender to meet needs.   Strongly recommend outpatient SLP to address exercise program to preserve current function of swallow and mitigate the effects of radiation treatment.  Pt/wife in agreement.  No further inpatient SLP needs are identified.  Thank you for consult.           Diet Recommendation Thin liquid   Liquid Administration via: Syringe Medication Administration:  (crush with liquids) Supervision: Patient able to self feed    Other  Recommendations     Follow Up Recommendations  Outpatient SLP          Royann Wildasin L. Samson Frederic, [place] CCC/SLP Pager [phone]   Blenda Mounts Laurice 03/26/2012,2:57 PM

## 2012-03-26 NOTE — Consult Note (Signed)
DENTAL CONSULTATION  Date of Consultation:  03/26/2012 Patient Name:   [name] Date of Birth:   18-Sep-1980 Medical Record Number: [id number]  VITALS: BP 112/73  Pulse 61  Temp(Src) 98.6 F (37 C) (Axillary)  Resp 18  Ht 6\' 2"  (1.88 m)  Wt 290 lb 9.1 oz (131.8 kg)  BMI 37.29 kg/m2  SpO2 94%   CHIEF COMPLAINT: Patient was referred for a medically necessary pre-radiation therapy dental protocol evaluation.  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 Initial diagnosis made in July 2013 after biopsy by Dr. Ocie Doyne (oral surgeon). The patient then had a partial glossectomy with Dr. Pollyann Kennedy on 08/22/2011. This was followed with a right modified neck dissection on 09/06/2011. Patient was followed closely without further adjuvant therapy at that time. Patient subsequently had a recurrence to the right lateral tongue. Patient underwent right hemiglossectomy with tracheostomy on 03/23/2012 with Dr. Pollyann Kennedy. Patient with anticipated radiation therapy and possible chemotherapy. Patient now seen as part of a medically necessary pre-chemoradiation therapy dental evaluation.  Patient currently denies acute toothache, swellings, or abscesses. Patient was last seen by his primary dentist, Dr. Fonnie Jarvis, approximately 8 months ago for an exam and cleaning. Patient is usually seen on an every 6 month basis. Patient denies having any unmet dental needs.  Patient Active Problem List  Diagnosis  . Malignant neoplasm of tongue, unspecified site    PMH: Past Medical History  Diagnosis Date  . High triglycerides   . Gout   . Staph skin infection     Elbow- resolved.    . Complication of anesthesia   . PONV (postoperative nausea and vomiting)   . Depression     "patient quit smoking was put on wellbutrin to assist"  . GERD (gastroesophageal reflux disease)   . Chronic kidney disease     Kidney Stone  . Cancer     tongue  cancer    PSH: Past Surgical History  Procedure Laterality Date  . Mouth surgery      biopsy- (08/06/2011 Dr. Barbette Merino), also had tissue graft (oral) 2010  . Glossectomy    . Glossectomy  08/22/2011    Procedure: GLOSSECTOMY;  Surgeon: Serena Colonel, MD;  Location: [facility] OR;  Service: ENT;  Laterality: Right;  WITH FROZEN SECTION  . Glossectomy  09/06/2011    Procedure: GLOSSECTOMY;  Surgeon: Serena Colonel, MD;  Location: [facility] OR;  Service: ENT;  Laterality: Right;  Right Tongue Re-Excision  . Radical neck dissection  09/06/2011    Procedure: RADICAL NECK DISSECTION;  Surgeon: Serena Colonel, MD;  Location: [facility] OR;  Service: ENT;  Laterality: Right;  Right Modified Neck Dissection   . Hemiglossectomy N/A 03/23/2012    Procedure: BIOPSY OF TONGUE WITH FROZEN SECTION AND RIGHT HEMIGLOSSECTOMY;  Surgeon: Serena Colonel, MD;  Location: [facility] OR;  Service: ENT;  Laterality: N/A;  . Tracheostomy tube placement N/A 03/23/2012    Procedure: TRACHEOSTOMY;  Surgeon: Serena Colonel, MD;  Location: [facility] OR;  Service: ENT;  Laterality: N/A;    ALLERGIES: No Known Allergies  MEDICATIONS: Current Facility-Administered Medications  Medication Dose Route Frequency Provider Last Rate Last Dose  . buPROPion (WELLBUTRIN SR) 12 hr tablet 150 mg  150 mg Oral BID Serena Colonel, MD      . clindamycin (CLEOCIN) IVPB 300 mg  300 mg Intravenous Q8H Serena Colonel, MD   300 mg at 03/26/12 0540  . dextrose 5 %-0.9 %  sodium chloride infusion   Intravenous Continuous Serena Colonel, MD 75 mL/hr at 03/25/12 2159    . HYDROcodone-acetaminophen (HYCET) 7.5-325 mg/15 ml solution 10-15 mL  10-15 mL Oral Q4H PRN Serena Colonel, MD      . morphine 2 MG/ML injection 2-4 mg  2-4 mg Intravenous Q2H PRN Serena Colonel, MD   2 mg at 03/26/12 0701  . promethazine (PHENERGAN) tablet 25 mg  25 mg Oral Q6H PRN Serena Colonel, MD       Or  . promethazine (PHENERGAN) suppository 25 mg  25 mg Rectal Q6H PRN Serena Colonel, MD      . simvastatin (ZOCOR) tablet 10 mg  10 mg Oral q1800  Serena Colonel, MD        LABS: Lab Results  Component Value Date   WBC 7.5 03/23/2012   HGB 14.6 03/23/2012   HCT 41.3 03/23/2012   MCV 85.0 03/23/2012   PLT 222 03/23/2012      Component Value Date/Time   NA 137 03/23/2012 0649   K 4.1 03/23/2012 0649   CL 102 03/23/2012 0649   CO2 25 03/23/2012 0649   GLUCOSE 98 03/23/2012 0649   BUN 8 03/23/2012 0649   CREATININE 0.91 03/23/2012 0649   CALCIUM 9.3 03/23/2012 0649   GFRNONAA >90 03/23/2012 0649   GFRAA >90 03/23/2012 0649   Lab Results  Component Value Date   INR 1.08 12/05/2011   INR 1.2 09/28/2008   No results found for this basename: PTT    SOCIAL HISTORY: History   Social History  . Marital Status: Married    Spouse Name: N/A    Number of Children: N/A  . Years of Education: N/A   Occupational History  . Not on file.   Social History Main Topics  . Smoking status: Former Smoker    Types: Cigars    Quit date: 08/14/2011  . Smokeless tobacco: Never Used  . Alcohol Use: No     Comment: quit drinking 7/13 from 12 pack of beeer on weekends  . Drug Use: No  . Sexually Active: Yes   Other Topics Concern  . Not on file   Social History Narrative   Married.   History of smoking 5 small cigars daily but quit in 2013.   History of drinking approximately a 12 pack of beer on weekends but quit in July 2013.          FAMILY HISTORY: Family History  Problem Relation Age of Onset  . Rheum arthritis Mother   . Asthma Father      REVIEW OF SYSTEMS: Reviewed from chart for this admission.  DENTAL HISTORY: CHIEF COMPLAINT: Patient was referred for a medically necessary pre-radiation therapy dental protocol evaluation.  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 Initial diagnosis made in July 2013 after biopsy by Dr. Ocie Doyne (oral surgeon). The patient then had a partial glossectomy with Dr. Pollyann Kennedy on 08/22/2011. This was followed with a right  modified neck dissection on 09/06/2011. Patient was followed closely without further adjuvant therapy at that time. Patient subsequently had a recurrence to the right lateral tongue. Patient underwent right hemiglossectomy with tracheostomy on 03/23/2012 with Dr. Pollyann Kennedy. Patient with anticipated radiation therapy and possible chemotherapy. Patient now seen as part of a medically necessary pre-chemoradiation therapy dental evaluation.  Patient currently denies acute toothache, swellings, or abscesses. Patient was last seen by his primary dentist, Dr. Fonnie Jarvis,  approximately 8 months ago for an exam and cleaning. Patient is usually seen on an every 6 month basis. Patient denies having any unmet dental needs.  DENTAL EXAMINATION:  GENERAL: Patient well-developed, well-nourished male in no acute distress. HEAD AND NECK: Patient with a tracheostomy in place. I am unable to palpate the neck at this time for possible lymphadenopathy. INTRAORAL EXAM: Patient with normal saliva. There is evidence of recent tongue surgery 03/23/2012 with Dr. Pollyann Kennedy. Patient with decreased maximum interincisal opening and trismus. DENTITION: Patient has a generally intact dentition with the exception of tooth numbers 16, 17, and 32. Tooth #1 is present as an impacted tooth. PERIODONTAL: Patient with relatively good oral hygiene is some gingivitis noted. I will defer periodontal charting at this time. DENTAL CARIES/SUBOPTIMAL RESTORATIONS: There are no obvious dental caries noted at this time. I will obtain a full series radiographs in my dental office if his primary dentist has not had one taken recently . ENDODONTIC: Patient currently denies acute pulpitis symptoms. I do not see any evidence of periapical pathology at this time. CROWN AND BRIDGE: There are no crown restorations noted. PROSTHODONTIC: There is no need for partial dentures at this time. OCCLUSION: The occlusion is stable at this time.  RADIOGRAPHIC  INTERPRETATION: An orthopantogram was obtained today. The patient is missing tooth numbers 16, 17, and 32. Tooth #1 is a full bony impaction. Patient with incipient bone loss. Several dental restorations are noted.   ASSESSMENTS: 1. Minimal plaque accumulations 2. Gingivitis -with periodontal charting be completed at a later date. 3. Oral tongue consistent with right hemiglossectomy. 4. Missing tooth #16, 17, and 32. 5. Impacted tooth #1 6. Stable occlusion 7. Trismus and Decreased maximum interincisal opening   PLAN/RECOMMENDATIONS: 1. I discussed the risks, benefits, and complications of various treatment options with the patient in relationship to his medical and dental conditions, anticipated radiation therapy and radiation therapy side effects to include xerostomia, radiation caries, trismus, mucositis, taste changes, gum and jawbone changes, for infection, bleeding, and osteoradionecrosis.  We discussed various treatment options to include no treatment, multiple extraction of teeth in primary field of radiation therapy, extraction of impacted tooth #1, alveoloplasty, pre-prosthetic surgery as indicated, periodontal therapy, dental restorations, root canal therapy, crown and bridge therapy, implant therapy, and replacement of missing teeth as indicated. We also discussed future fabrication of fluoride trays, scatter protection devices, trismus device, and a possible tongue positioner/radiation cone locator device. The patient will be rescheduled to dental medicine approximately 7-10 days for followup exam, dental radiographs, and impressions for the future fabrication of prosthetic devices as needed. Also consider a second opinion evaluation by an oral surgeon concerning discussion of treatment options with the patient due to his young age and future potential risk for osteoradionecrosis. The patient may also be referred to physical therapy for initiation of trismus exercises to help increase  maximum interincisal opening to allow for future fabrication of prosthetic devices.   2. Discussion of findings with medical team and coordination of future medical and dental care as indicated.  Charlynne Pander, DDS

## 2012-03-27 MED ORDER — DEXAMETHASONE SODIUM PHOSPHATE 4 MG/ML IJ SOLN
8.0000 mg | Freq: Once | INTRAMUSCULAR | Status: DC
  Filled 2012-03-27: qty 2

## 2012-03-27 MED ORDER — DEXAMETHASONE SODIUM PHOSPHATE 10 MG/ML IJ SOLN
8.0000 mg | Freq: Once | INTRAMUSCULAR | Status: AC
  Administered 2012-03-27: 8 mg via INTRAVENOUS
  Filled 2012-03-27: qty 0.8
  Filled 2012-03-27: qty 2

## 2012-03-27 MED ORDER — HYDROCODONE-ACETAMINOPHEN 7.5-325 MG/15ML PO SOLN: 10.0000 mL | ORAL | Status: DC | PRN

## 2012-03-27 NOTE — Discharge Summary (Signed)
Physician Discharge Summary  Patient ID: [name] MRN: [id number] DOB/AGE: 27-Nov-1980 32 y.o.  Admit date: 03/23/2012 Discharge date: 03/27/2012  Admission Diagnoses:Tongue cancer  Discharge Diagnoses:  Active Problems:   Malignant neoplasm of tongue, unspecified site   Discharged Condition: good  Hospital Course: no complications. Speech Pathology consulted for swallowing therapy. Also had a flair up of gout on day of discharge treated with IM steroid.  Consults: SLP  Significant Diagnostic Studies: none  Treatments: surgery: Hemiglossectomy/ tracheostomy  Discharge Exam: Blood pressure 122/74, pulse 89, temperature 99 F (37.2 C), temperature source Axillary, resp. rate 18, height 6\' 2"  (1.88 m), weight 290 lb 9.1 oz (131.8 kg), SpO2 98.00%. PHYSICAL EXAM: Tongue healing nicely. Trach removed and trach site closed with benzoin and steri-strips.  Disposition: 01-Home or Self Care  Discharge Orders   Future Orders Complete By Expires     Diet - low sodium heart healthy  As directed     Increase activity slowly  As directed         Medication List    TAKE these medications       buPROPion 150 MG 12 hr tablet  Commonly known as:  WELLBUTRIN SR  Take 150 mg by mouth 2 (two) times daily.     HYDROcodone-acetaminophen 7.5-325 mg/15 ml solution  Commonly known as:  HYCET  Take 10-15 mLs by mouth every 4 (four) hours as needed.     simvastatin 10 MG tablet  Commonly known as:  ZOCOR  Take 10 mg by mouth daily.           Follow-up Information   Follow up with Serena Colonel, MD In 1 week.   Contact information:   [address], [address] [place], [place] [address] [place] [address] [phone]       Signed: Serena Colonel 03/27/2012, 10:41 AM

## 2012-03-27 NOTE — Progress Notes (Signed)
[name] has been removed by MD and dressed. Pt is doing well and able to talk with pressure to stoma. PT is being discharged home.

## 2012-04-03 ENCOUNTER — Encounter: Payer: Self-pay | Admitting: Radiation Oncology

## 2012-04-03 NOTE — Progress Notes (Signed)
32 year old male. Former smoker.  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 S/P glossectomy.  NKDA No hx of radiation therapy  No indication of a pacemaker

## 2012-04-06 ENCOUNTER — Ambulatory Visit
Admission: RE | Admit: 2012-04-06 | Discharge: 2012-04-06 | Disposition: A | Discharge status: Home / Self Care | Payer: 59 | Source: Ambulatory Visit | Attending: Radiation Oncology | Admitting: Radiation Oncology

## 2012-04-06 ENCOUNTER — Encounter: Payer: Self-pay | Admitting: Radiation Oncology

## 2012-04-06 VITALS — BP 139/81 | HR 84 | Temp 97.0°F | Resp 18 | Ht 74.0 in | Wt 284.1 lb

## 2012-04-06 DIAGNOSIS — N189 Chronic kidney disease, unspecified: Secondary | ICD-10-CM | POA: Insufficient documentation

## 2012-04-06 DIAGNOSIS — K219 Gastro-esophageal reflux disease without esophagitis: Secondary | ICD-10-CM | POA: Insufficient documentation

## 2012-04-06 DIAGNOSIS — C023 Malignant neoplasm of anterior two-thirds of tongue, part unspecified: Secondary | ICD-10-CM | POA: Insufficient documentation

## 2012-04-06 DIAGNOSIS — C029 Malignant neoplasm of tongue, unspecified: Secondary | ICD-10-CM | POA: Insufficient documentation

## 2012-04-06 NOTE — Progress Notes (Signed)
Patient presents to the clinic today accompanied by his wife for a consultation with Dr. Roselind Messier to discuss the role of radiation therapy in the treatement of squamous cell ca of the right lateral tongue. Patient lives in [place] but, works in [place]. 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 Patient reports that he had to stop working at the air port and now just works at [employer]. Patient is alert and oriented to person, place, and time. No distress noted. Steady gait noted. Pleasant affect noted. Patient denies pain at this time. Patient reports that he feels like he has healed well from surgery and he eating what he wants when he wants. Patient reports that old trach site has almost closed. Right neck dissection scar is well approximated without redness, drainage or edema. Patient reports that he feels great and has a good appetite. Patient reports sleeping without difficulty. Patient reports that he has only lost approximately five pounds since surgery. Patient reports thick ropey saliva that has greatly improved. Patient denies sores or ulceration of the mouth. Patient denies painful or difficulty swallowing. Patient denies nausea, vomiting, headache, dizziness, diarrhea or night sweats. Reported all findings to Dr. Roselind Messier.

## 2012-04-06 NOTE — Progress Notes (Signed)
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 male who is seen out courtesy of Dr. Rosalie Doctor in for an opinion concerning radiation therapy as part of management of patient's recurrent squamous cell carcinoma of the right lateral oral tongue. Last summer the patient was found to have a lesion along his right lateral oral tongue  by his dentist. He was seen by Dr. Barbette Merino and a biopsy was performed which revealed malignancy. On 08/22/11 the patient was taken to the operating room by Dr. Pollyann Kennedy at which time the patient underwent a right partial glossectomy. Upon pathologic review the patient was found to have a 1.3 cm lesion, grade 2 squamous cell carcinoma. The deep margin was close at 0.8 cm and there was skeletal muscle involvement. In light of the close margin the patient was taken back to the operating room on 09/06/2011 at which time the patient underwent re- dissection of the tongue and radical neck section of the levels 1, 2 and 3.   within the tongue specimen there was no residual malignancy. One of the level II lymph nodes showed metastatic squamous carcinoma. Additional 23 benign lymph nodes were recovered with the right neck dissection. Patient did well after his initial surgery.   he was followed closely. A PET scan was performed 11/06/2011. There was question of a small mildly hypermetabolic level II left lymph node.  A ultrasound of the neck was performed which revealed a dominant left cervical lymph node just posterior to the lateral submandibular gland. This was biopsied and returned benign tissue. Earlier this year the patient was noted to  have an area of ulceration along the tumor bed within the oral tongue.   a biopsy of this area was performed which revealed well-differentiated squamous cell carcinoma. The patient proceeded to undergo additional resection of the tongue which revealed a residual 1.5 cm well-differentiated squamous cell carcinoma. There was no definite lymphovascular space invasion. The surgical margins were clear. The tumor did invade into the underlying muscle.  The closest surgical margin was 1.2 cm that being the deep margin.  With these findings the patient is referred to radiation oncology for consideration for treatment.  PREVIOUS RADIATION THERAPY: No  PAST MEDICAL HISTORY:  has a past medical history of High triglycerides; Gout; Staph skin infection; Complication of anesthesia; PONV (postoperative nausea and vomiting); Depression; GERD (gastroesophageal reflux disease); Chronic kidney disease; and Cancer.    PAST SURGICAL HISTORY: Past Surgical History  Procedure Laterality Date  . Mouth surgery      biopsy- (08/06/2011 Dr. Barbette Merino), also had tissue graft (oral) 2010  . Glossectomy    . Glossectomy  08/22/2011    Procedure: GLOSSECTOMY;  Surgeon: Serena Colonel, MD;  Location: [facility] OR;  Service: ENT;  Laterality: Right;  WITH FROZEN SECTION  . Glossectomy  09/06/2011    Procedure: GLOSSECTOMY;  Surgeon: Serena Colonel, MD;  Location: [facility] OR;  Service: ENT;  Laterality: Right;  Right Tongue Re-Excision  . Radical neck dissection  09/06/2011    Procedure: RADICAL NECK DISSECTION;  Surgeon: Serena Colonel, MD;  Location: [facility] OR;  Service: ENT;  Laterality: Right;  Right Modified Neck Dissection   .  Hemiglossectomy N/A 03/23/2012    Procedure: BIOPSY OF TONGUE WITH FROZEN SECTION AND RIGHT HEMIGLOSSECTOMY;  Surgeon: Serena Colonel, MD;  Location: [facility] OR;  Service: ENT;  Laterality: N/A;  . Tracheostomy tube placement N/A 03/23/2012    Procedure: TRACHEOSTOMY;  Surgeon: Serena Colonel, MD;  Location: [facility] OR;  Service: ENT;  Laterality:  N/A;  . Tongue biopsy      FAMILY HISTORY: family history includes Asthma in his father; Cancer in his maternal grandmother; and Rheum arthritis in his mother.  SOCIAL HISTORY:  reports that he quit smoking about 7 months ago. His smoking use included Cigars. He has never used smokeless tobacco. He reports that he does not drink alcohol or use illicit drugs. he is accompanied by his wife on evaluation today.  ALLERGIES: Review of patient's allergies indicates no known allergies.  MEDICATIONS:  Current Outpatient Prescriptions  Medication Sig Dispense Refill  . buPROPion (WELLBUTRIN SR) 150 MG 12 hr tablet Take 150 mg by mouth 2 (two) times daily.       . simvastatin (ZOCOR) 10 MG tablet Take 10 mg by mouth daily.       Marland Kitchen HYDROcodone-acetaminophen (HYCET) 7.5-325 mg/15 ml solution Take 10-15 mLs by mouth every 4 (four) hours as needed.  480 mL  1   No current facility-administered medications for this encounter.    REVIEW OF SYSTEMS:  A 15 point review of systems is documented in the electronic medical record. This was obtained by the nursing staff. However, I reviewed this with the patient to discuss relevant findings and make appropriate changes.  He has soreness in the tongue area as expected with his surgery. His speech and swallowing improves the further he gets away from his surgery. He denies any cough or breathing problems. He denies any new bony pain,  headaches dizziness or blurred vision.    PHYSICAL EXAM:  height is 6\' 2"  (1.88 m) and weight is 284 lb 1.6 oz (128.867 kg). His oral temperature is 97 F (36.1 C). His blood pressure is 139/81 and his pulse is 84. His respiration is 18.   BP 139/81  Pulse 84  Temp(Src) 97 F (36.1 C) (Oral)  Resp 18  Ht 6\' 2"  (1.88 m)  Wt 284 lb 1.6 oz (128.867 kg)  BMI 36.46 kg/m2  General Appearance:    Alert, cooperative, no distress, appears stated age  Head:    Normocephalic, without obvious abnormality, atraumatic  Eyes:    PERRL,  conjunctiva/corneas clear, EOM's intact,         Ears:    Normal TM's and external ear canals, both ears  Nose:   Nares normal, septum midline, mucosa normal, no drainage    or sinus tenderness  Throat:   Lips, mucosa normal, the patient has a scar along his right lateral oral tongue which extends just past midline.  A surgical defect is appreciated. This appears to be healing well.  there no mucosal lesions noted in the oral cavity or posterior pharynx. Indirect mirror examination is performed which reveals  no mucosal lesions. I was unable to get a complete view of the vocal cords but they appear to move well. The patient's teeth are in good repair.   Neck:   Supple, symmetrical, trachea midline, no adenopathy;       thyroid:  No enlargement/tenderness/nodules; no carotid   bruit or JVD,  well-healed scar in the right neck from his prior dissection, patient has a tracheostomy in the lower anterior neck which is  slowly closing. There is no obvious signs of infection.   Back:     Symmetric, no curvature, ROM normal, no CVA tenderness  Lungs:     Clear to auscultation bilaterally, respirations unlabored  Chest wall:    No tenderness or deformity  Heart:    Regular rate and rhythm, , no murmur, rub   or gallop  Abdomen:     Soft, non-tender, bowel sounds active all four quadrants,    no masses, no organomegaly        Extremities:   Extremities normal, atraumatic, no cyanosis or edema  Pulses:   2+ and symmetric all extremities  Skin:   Skin color, texture, turgor normal, no rashes or lesions, multiple tattoos along the arm areas.   Lymph nodes:   Cervical, supraclavicular, and axillary nodes normal  Neurologic:   CNII-XII intact. Normal strength, sensation and reflexes      throughout    LABORATORY DATA:  Lab Results  Component Value Date   WBC 7.5 03/23/2012   HGB 14.6 03/23/2012   HCT 41.3 03/23/2012   MCV 85.0 03/23/2012   PLT 222 03/23/2012   Lab Results  Component Value Date   NA 137  03/23/2012   K 4.1 03/23/2012   CL 102 03/23/2012   CO2 25 03/23/2012   Lab Results  Component Value Date   ALT 29 09/28/2008   AST 24 09/28/2008   ALKPHOS 61 09/28/2008   BILITOT 0.4 09/28/2008     RADIOGRAPHY: Dg Orthopantogram  03/26/2012  *RADIOLOGY REPORT*  Clinical Data: Pre radiation.  Rule out periapical pathology.  ORTHOPANTOGRAM/PANORAMIC  Comparison: None.  Findings: No periapical pathology visualized.  No acute bony abnormality.  IMPRESSION: No evidence for periapical pathology.   Original Report Authenticated By: Charlett Nose, M.D.       IMPRESSION: Recurrent squamous cell carcinoma of the oral tongue.  The patient is at risk for additional recurrence and I would recommend postoperative radiation therapy in this situation. This treatment would be directed at the oral tongue as well as the neck regions.  PLAN: Dental evaluation. Patient will see Dr. Kristin Bruins later this week. The patient will likely require surgery for impacted molar  along the right maxillary region.  He is also being seen in physical therapy for his problems with trismus. He will also be set up for speech pathology evaluation. I will also have the patient see medical oncology since he does have a recurrence to see if he would benefit from  chemotherapy. I also discussed consideration for a PEG given anticipated problems with the nutrition and hydration towards the end of the patient's radiation therapy.  I  would recommend approximately 7 weeks of radiation therapy with image guided intensity modulated radiation therapy.  I spent 60 minutes minutes face to face with the patient and more than 50% of that time was spent in counseling and/or coordination of care.   ------------------------------------------------  -----------------------------------  Billie Lade, PhD, MD

## 2012-04-06 NOTE — Progress Notes (Signed)
Complete PATIENT MEASURE OF DISTRESS worksheet with a score of 0 submitted to social work.  

## 2012-04-06 NOTE — Progress Notes (Signed)
See progress note under physician encounter. 

## 2012-04-07 ENCOUNTER — Ambulatory Visit (HOSPITAL_COMMUNITY): Payer: Self-pay | Admitting: Dentistry

## 2012-04-07 ENCOUNTER — Telehealth: Payer: Self-pay | Admitting: Oncology

## 2012-04-07 ENCOUNTER — Encounter (HOSPITAL_COMMUNITY): Payer: Self-pay | Admitting: Dentistry

## 2012-04-07 ENCOUNTER — Telehealth: Payer: Self-pay | Admitting: *Deleted

## 2012-04-07 VITALS — BP 117/79 | HR 80 | Temp 98.4°F | Ht 74.0 in | Wt 286.0 lb

## 2012-04-07 DIAGNOSIS — Z463 Encounter for fitting and adjustment of dental prosthetic device: Secondary | ICD-10-CM

## 2012-04-07 DIAGNOSIS — Z0189 Encounter for other specified special examinations: Secondary | ICD-10-CM

## 2012-04-07 DIAGNOSIS — K08109 Complete loss of teeth, unspecified cause, unspecified class: Secondary | ICD-10-CM

## 2012-04-07 DIAGNOSIS — R252 Cramp and spasm: Secondary | ICD-10-CM

## 2012-04-07 DIAGNOSIS — K036 Deposits [accretions] on teeth: Secondary | ICD-10-CM

## 2012-04-07 DIAGNOSIS — K029 Dental caries, unspecified: Secondary | ICD-10-CM

## 2012-04-07 DIAGNOSIS — C021 Malignant neoplasm of border of tongue: Secondary | ICD-10-CM

## 2012-04-07 DIAGNOSIS — K0530 Chronic periodontitis, unspecified: Secondary | ICD-10-CM

## 2012-04-07 DIAGNOSIS — K089 Disorder of teeth and supporting structures, unspecified: Secondary | ICD-10-CM

## 2012-04-07 DIAGNOSIS — C023 Malignant neoplasm of anterior two-thirds of tongue, part unspecified: Secondary | ICD-10-CM

## 2012-04-07 DIAGNOSIS — K011 Impacted teeth: Secondary | ICD-10-CM

## 2012-04-07 MED ORDER — CHLORHEXIDINE GLUCONATE 0.12 % MT SOLN: OROMUCOSAL | Status: DC

## 2012-04-07 MED ORDER — SODIUM FLUORIDE 1.1 % DT GEL: DENTAL | Status: AC

## 2012-04-07 NOTE — Telephone Encounter (Signed)
C/D 04/07/12 for appt. 04/09/12

## 2012-04-07 NOTE — Telephone Encounter (Signed)
CALLED PATIENT TO INFORM OF APPT. WITH DR. CARL SCHINKE ON 04-13-12- [facility], SPOKE WITH PATIENT AND HE IS AWARE OF THIS APPT.

## 2012-04-07 NOTE — Telephone Encounter (Signed)
Pt schedule per Dr. Gaylyn Rong.  Will contact pt with appt.

## 2012-04-07 NOTE — Progress Notes (Signed)
04/07/2012  Patient:            [name] Date of Birth:  01/05/1981 MRN:                [id number]  BP 117/79  Pulse 80  Temp(Src) 98.4 F (36.9 C) (Oral)  Ht 6\' 2"  (1.88 m)  Wt 286 lb (129.729 kg)  BMI 36.7 kg/m2  [name] is a 32 year old male recently diagnosed with recurrent squamous cell carcinoma of right lateral tongue. Patient underwent surgical resection with tracheostomy 03/23/2012. Tracheostomy site is now healing in. Patient with anticipated radiation therapy at this time. Patient is now seen for further dental evaluation as part of a medically necessary preradiation therapy dental protocol evaluation as well as initiation of the start of fabrication of the radiation cone Locator/tongue positioner as well as fluoride trays.  SUBJECTIVE: Patient currently indicates he has had significant healing take place since initially seen by dental medicine as an inpatient on 03/26/2012. The patient saw Dr. Brynda Peon for postop evaluation last Thursday.The patient has also seen the radiation oncologist-Dr. Roselind Messier for his evaluation. Patient states that his trismus and healing is much better and now has maximum interincisal opening of 36mm. Patient agrees to proceed with obtaining a full series of dental radiographs along with initial impressions for the start of the fabrication of the radiation cone locator. Patient also agrees to be referred to an oral surgeon for evaluation of extraction of tooth #1 at this time prior to radiation therapy due to the potential proximity of this tooth to the radiation field.  Patient will also followup with his primary dentist (Dr. Fonnie Jarvis) for a dental treatment as needed prior to start of radiation therapy.  Dental examination:  General: Patient well-developed, well-nourished male in no acute distress. Head and neck: Right neck is consistent with previous neck dissection in August 2013. Tracheostomy site is healing in from surgery in February of  2014. Intraoral exam: Patient has normal saliva. Maximum interincisal opening is measured at 36 mm.  Trismus device fabricated at 36 mm with 22 tongue depressors. Right lateral tongue is consistent with previous hemiglossectomy by Dr. Pollyann Kennedy in February of 2014. Dentition: Patient is missing tooth numbers 16, 17, and 32. Tooth #1 is a full bony impaction.  Periodontal: Patient with chronic periodontitis with significant plaque accumulations, selective areas of gingival recession, and incipient tooth mobility of tooth numbers 24/25. Tooth #24/25 on lingual has had previous history of free gingival graft in 2012 by Dr. Filbert Berthold. Dental caries: Multiple incipient dental caries are noted at this time.  I will have Dr. Fonnie Jarvis (primary dentist) reevaluate these areas for dental restorations. Multiple areas of facial decalcification are noted. Crown and bridge: Patient has a porcelain crown the tooth #30 that is loose. I will have Dr. Mylinda Latina evaluate this for recementation. Endodontic: Patient with a history of root canal therapy associated with tooth #5. There does not appear to be any persistent periapical pathology or symptoms. Patient currently denies acute pulpitis symptoms. I do not see any evidence of apical radiolucency. Prosthodontics: No need for partial dentures. Occlusion: Patient has a stable occlusion at this time. Radiographic interpretation: An orthopantogram was obtained as an inpatient. A full series of dental radiographs was obtained today. There multiple missing teeth numbers 16, 17, and 32. Tooth number was a full bony impaction. Patient with incipient bone loss. Multiple dental restorations are noted. The patient may have incipient dental caries associated with the mesial  of #, the distal of #8, and the mesial and distal of #13, the buckle of #18, that the mesial #19, and the distal of #20.  Assessments: 1. Chronic periodontitis with incipient bone loss 2. Plaque  accumulations 3. Oral tongue consistent with previous hemiglossectomy on the right 4. Missing tooth numbers 16, 17 and 32. 5. Impacted tooth #1 6. incipient dental caries 7. Trismus and decreased maximum interincisal opening measured at 36 mm 8. Stable occlusion 9. Mmultiple areas of facial decalcification.  Plan/recommendations: 1. Will refer to an oral surgeon-Dr. Chales Salmon for evaluation for extraction of tooth #1. This is been scheduled for today at 3:45 PM. 2. Will start fabrication of the radiation cone locator/tongue positioner-today. Next appointment has been scheduled for Wednesday at 9 AM. 3. Will obtain impressions for the fabrication of fluoride trays-today. 4. Prescription for FluoroSHIELD was sent to [facility] pharmacy via [facility] Fax.  5. Prescription for chlorhexidine rinse was sent to [facility] pharmacy via [facility] fax. 6. I will refer patient to his primary dentist Dr. Fonnie Jarvis for a dental cleaning, re-cementation of crown on #30, and evaluation for resin restorations on the following:     Mesial of #7, distal of #8, mesial and distal of #13, buccal of #18, mesial of #19, and distal of #20. 7. I will refer back to Dr. Roselind Messier for stimulation once radiation cone locator/tongue positioner has been fabricated.

## 2012-04-07 NOTE — Patient Instructions (Addendum)
RADIATION THERAPY AND DECISIONS REGARDING YOUR TEETH  Xerostomia (dry mouth) Your salivary glands may be in the filed of radiation.  Radiation may include all or part of your saliva glands.  This will cause your saliva to dry up and you will have a dry mouth.  The dry mouth will be for the rest of your life unless your radiation oncologist tells you otherwise.  Your saliva has many functions:  Saliva wets your tongue for speaking.  It coats your teeth and the inside of your mouth for easier movement.  It helps with chewing and swallowing food.  It helps clean away harmful acid and toxic products made by the germs in your mouth, therefore it helps prevent cavities.  It kills some germs in your mouth and helps to prevent gum disease.  It helps to carry flavor to your taste buds.  Once you have lost your saliva you will be at higher risk for tooth decay and gum disease.  What can be done to help improve your mouth when there's not enough saliva:  1.  Your dentist may give a prescription for Salagen.  It will not bring back all of your saliva but may bring back some of it.  Also your saliva may be thick and ropy or white and foamy. It will not feel like it use to feel.  2.  You will need to swish with water every time your mouth feels dry.  YOU CANNOT suck on any cough drops, mints, lemon drops, candy, vitamin C or any other products.  You cannot use anything other than water to make your mouth feel less dry.  If you want to drink anything else you have to drink it all at once and brush afterwards.  Be sure to discuss the details of your diet habits with your dentist or hygienist.  Radiation caries: This is decay that happens very quickly once your mouth is very dry due to radiation therapy.  Normally cavities take six months to two years to become a problem.  When you have dry mouth cavities may take as little as eight weeks to cause you a problem.  This is why dental check ups every two  months are necessary as long as you have a dry mouth. Radiation caries typically, but not always, start at your gum line where it is hard to see the cavity.  It is therefore also hard to fill these cavities adequately.  This high rate of cavities happens because your mouth no longer has saliva and therefore the acid made by the germs starts the decay process.  Whenever you eat anything the germs in your mouth change the food into acid.  The acid then burns a small hole in your tooth.  This small hole is the beginning of a cavity.  If this is not treated then it will grow bigger and become a cavity.  The way to avoid this hole getting bigger is to use fluoride every evening as prescribed by your dentist.  You have to make sure that your teeth are very clean before you use the fluoride.  This fluoride in turn will strengthen your teeth and prepare them for another day of fighting acid.  If you develop radiation caries many times the damage is so large that you will have to have all your teeth removed.  This could be a big problem if some of these teeth are in the field of radiation.  Further details of why this could be   a big problem will follow.  (See Osteoradionecrosis).  Loss of taste (dysgeusia) This happens to varying degrees once you've had radiation therapy to your jaw region.  Many times taste is not completely lost but becomes limited.  The loss of taste is mostly due to radiation affecting your taste buds.  However if you have no saliva in your mouth to carry the flavor to your taste buds it would be difficult for your taste buds to taste anything.  That is why using water or a prescription for Salagen prior to meals and during meals may help with some of the taste.  Keep in mind that taste generally returns very slowly over the course of several months or several years after radiation therapy.  Don't give up hope.  Trismus According to your Radiation Oncologist your TMJ or jaw joints are going to be  partially or fully in the field of radiation.  This means that over time the muscles that help you open and close your mouth may get stiff.  This will potentially result in your not being able to open your mouth wide enough or as wide as you can open it now.  Le me give you an example of how slowly this happens and how unaware people are of it.  A gentlemen that had radiation therapy two years ago came back to me complaining that bananas are just too large for him to be able to fit them in between his teeth.  He was not able to open wide enough to bite into a banana.  This happens slowly and over a period of time.  What do we do to try and prevent this?  Your dentist will probably give you a stack of sticks called a trismus exercise device .  This stack will help your remind your muscles and your jaw joint to open up to the same distance every day.  Use these sticks every morning when you wake up according to the instructions given by the dentist.   You must use these sticks for at least one to two years after radiation therapy.  The reason for that is because it happens so slowly and keeps going on for about two years after radiation therapy.  Your hospital dentist will help you monitor your mouth opening and make sure that it's not getting smaller.  Osteoradionecrosis (ORN) This is a condition where your jaw bone after having had radiation therapy becomes very dry.  It has very little blood supply to keep it alive.  If you develop a cavity that turns into an abscess or an infection then the jaw bone does not have enough blood supply to help fight the infection.  At this point it is very likely that the infection could cause the death of your jaw bone.  When you have dead bone it has to be removed.  Therefore you might end up having to have surgery to remove part of your jaw bone, the part of the jaw bone that has been affected.   Healing is also a problem if you are to have surgery in the areas where the bone  has had radiation therapy.  The same reasons apply.  If you have surgery you need more blood supply which is not available.  When blood supply and oxygen are not available again, there is a chance for the bone to die.  Occasionally ORN happens on its own with no obvious reason.  This is quite rare.  We believe that   patients who continue to smoke and/or drink alcohol have a higher chance of having this bone problem.  Therefore once your jaw bone has had radiation therapy if there are any teeth in that area, you should never have them pulled.  You should also never have any surgery on your teeth or gums in that area unless the oral surgeon or Periodontist is aware of your history of radiation. There is some expensive management techniques that might be used to limit your risks.  The risks for ORN either from infection or spontaneous ( or on it's own) are life long.    TRISMUS  Trismus is a condition where the jaw does not allow the mouth to open as wide as it usually does.  This can happen almost suddenly, or in other cases the process is so slow, it is hard to notice it-until it is too far along.  When the jaw joints and/or muscles have been exposed to radiation treatments, the onset of Trismus is very slow.  This is because the muscles are losing their stretching ability over a long period of time, as long as 2 YEARS after the end of radiation.  It is therefore important to exercise these muscles and joints.  TRISMUS EXERCISES   Stack of tongue depressors measuring the same or a little less than the last documented MIO (Maximum Interincisal Opening).  Secure them with a rubber band on both ends.  Place the stack in the patient's mouth, supporting the other end.  Allow 30 seconds for muscle stretching.  Rest for a few seconds.  Repeat 3-5 times  For all radiation patients, this exercise is recommended in the mornings and evenings unless otherwise instructed.  The exercise should be done for  a period of 2 YEARS after the end of radiation.  MIO should be checked routinely on recall dental visits by the general dentist or the hospital dentist.  The patient is advised to report any changes, soreness, or difficulties encountered when doing the exercises.  FLUORIDE TRAYS PATIENT INSTRUCTIONS    Obtain prescription from the pharmacy.  Don't be surprised if it needs to be ordered.   Be sure to let the pharmacy know when you are close to needing a new refill for them to have it ready for you without interruption of Fluoride use.   The best time to use your Fluoride is before bed time.   You must brush your teeth very well and floss before using the Fluoride in order to get the best use out of the Fluoride treatments.   Place 1 drop of Fluoride gel per tooth in the tray.   Place the tray on your lower teeth and/or your upper teeth.  Make sure the trays are seated all the way.  Remember, they only fit one way on your teeth.   Insert for 5 full minutes.   At the end of the 5 minutes, take the trays out.  SPIT OUT excess. .    Do NOT rinse your mouth!    Do NOT eat or drink after treatments for at least 30 minutes.  This is why the best time for your treatments is before bedtime.    Clean the inside of your Fluoride trays using COLD WATER and a toothbrush.    In order to keep your Trays from discoloring and free from odors, soak them overnight in denture cleaners such as Efferdent.  Do not use bleach or non denture products.    Store the trays   in a safe dry place AWAY from any heat until your next treatment.    Bring the trays with you for your next dental check-up.  The dentist will confirm their fit.    If anything happens to your Fluoride trays, or they don't fit as well after any dental work, please let us know as soon as possible. 

## 2012-04-08 ENCOUNTER — Ambulatory Visit (HOSPITAL_COMMUNITY): Payer: Self-pay | Admitting: Dentistry

## 2012-04-08 ENCOUNTER — Encounter (HOSPITAL_COMMUNITY): Payer: Self-pay | Admitting: Dentistry

## 2012-04-08 VITALS — BP 126/75 | HR 73 | Temp 98.1°F

## 2012-04-08 DIAGNOSIS — C021 Malignant neoplasm of border of tongue: Secondary | ICD-10-CM

## 2012-04-08 DIAGNOSIS — Z463 Encounter for fitting and adjustment of dental prosthetic device: Secondary | ICD-10-CM

## 2012-04-08 DIAGNOSIS — Z0189 Encounter for other specified special examinations: Secondary | ICD-10-CM

## 2012-04-08 DIAGNOSIS — C023 Malignant neoplasm of anterior two-thirds of tongue, part unspecified: Secondary | ICD-10-CM

## 2012-04-08 NOTE — Patient Instructions (Addendum)
Followup with Dr. Chales Salmon for surgery in the morning. Followup with Dr. Roselind Messier for simulation appointment is scheduled. Followup with primary dentist for dental treatment today 3 PM . FLUORIDE TRAYS PATIENT INSTRUCTIONS    Obtain prescription from the pharmacy.  Don't be surprised if it needs to be ordered.   Be sure to let the pharmacy know when you are close to needing a new refill for them to have it ready for you without interruption of Fluoride use.   The best time to use your Fluoride is before bed time.   You must brush your teeth very well and floss before using the Fluoride in order to get the best use out of the Fluoride treatments.   Place 1 drop of Fluoride gel per tooth in the tray.   Place the tray on your lower teeth and/or your upper teeth.  Make sure the trays are seated all the way.  Remember, they only fit one way on your teeth.   Insert for 5 full minutes.   At the end of the 5 minutes, take the trays out.  SPIT OUT excess. .    Do NOT rinse your mouth!    Do NOT eat or drink after treatments for at least 30 minutes.  This is why the best time for your treatments is before bedtime.    Clean the inside of your Fluoride trays using COLD WATER and a toothbrush.    In order to keep your Trays from discoloring and free from odors, soak them overnight in denture cleaners such as Efferdent.  Do not use bleach or non denture products.    Store the trays in a safe dry place AWAY from any heat until your next treatment.    Bring the trays with you for your next dental check-up.  The dentist will confirm their fit.    If anything happens to your Fluoride trays, or they don't fit as well after any dental work, please let us know as soon as possible.  Dr. Kristin Bruins

## 2012-04-08 NOTE — Progress Notes (Signed)
04/08/2012  Patient:            [name] Date of Birth:  1980/10/21 MRN:                [id number]  BP 126/75  Pulse 73  Temp(Src) 98.1 F (36.7 C) (Oral)  [name] presents for continued fabrication of the radiation cone locator and for insertion of the fluoride trays. Patient saw Dr. Chales Salmon yesterday. Patient was evaluated for extraction of tooth #1 and possibly tooth numbers 24 and 25 due to periodontal involvement. Decision was made to extract tooth #1 only. Patient will have tooth extraction tomorrow morning with Dr. Chales Salmon. Patient with dental appointment with Dr. Mylinda Latina for today at 3 PM and pending simulation appointment with Dr. Roselind Messier.  PROCEDURE: Fluoride trays were tried in and adjusted as needed. [place]. Trismus device was previously fabricated 36 mm using 22 sticks. Postop instructions were provided and a written and verbal format concerning the use and care of appliances. All questions were answered. Radiation cone locator and tongue positioner fabrication was then completed. Appliance was polished and adjusted as needed. Instructions were provided on the use and care of the appliance and use and radiation therapy. The patient was instructed on how to insert and removed the radiation cone locator. Patient is to return to clinic for periodic oral examination in approximately 2-3 weeks during radiation therapy. Patient to call if questions or problems arise before then.  Charlynne Pander, DDS

## 2012-04-09 ENCOUNTER — Ambulatory Visit: Payer: Self-pay | Admitting: Oncology

## 2012-04-09 ENCOUNTER — Other Ambulatory Visit: Payer: Self-pay

## 2012-04-09 ENCOUNTER — Ambulatory Visit: Payer: Self-pay

## 2012-04-09 ENCOUNTER — Other Ambulatory Visit: Payer: Self-pay | Admitting: Radiology

## 2012-04-13 ENCOUNTER — Ambulatory Visit: Payer: 59

## 2012-04-13 ENCOUNTER — Encounter (HOSPITAL_COMMUNITY): Payer: Self-pay | Admitting: Pharmacy Technician

## 2012-04-13 ENCOUNTER — Ambulatory Visit: Payer: 59 | Attending: Radiation Oncology

## 2012-04-13 ENCOUNTER — Telehealth: Payer: Self-pay | Admitting: Radiation Oncology

## 2012-04-13 ENCOUNTER — Ambulatory Visit
Admission: RE | Admit: 2012-04-13 | Discharge: 2012-04-13 | Disposition: A | Discharge status: Home / Self Care | Payer: 59 | Source: Ambulatory Visit | Attending: Radiation Oncology | Admitting: Radiation Oncology

## 2012-04-13 ENCOUNTER — Encounter: Payer: Self-pay | Admitting: Radiation Oncology

## 2012-04-13 VITALS — BP 138/82 | HR 87 | Temp 97.0°F | Resp 16 | Wt 287.5 lb

## 2012-04-13 DIAGNOSIS — C029 Malignant neoplasm of tongue, unspecified: Secondary | ICD-10-CM

## 2012-04-13 DIAGNOSIS — C023 Malignant neoplasm of anterior two-thirds of tongue, part unspecified: Secondary | ICD-10-CM | POA: Insufficient documentation

## 2012-04-13 DIAGNOSIS — Z510 Encounter for antineoplastic radiation therapy: Secondary | ICD-10-CM | POA: Insufficient documentation

## 2012-04-13 DIAGNOSIS — IMO0001 Reserved for inherently not codable concepts without codable children: Secondary | ICD-10-CM | POA: Insufficient documentation

## 2012-04-13 DIAGNOSIS — K1231 Oral mucositis (ulcerative) due to antineoplastic therapy: Secondary | ICD-10-CM | POA: Insufficient documentation

## 2012-04-13 DIAGNOSIS — Z79899 Other long term (current) drug therapy: Secondary | ICD-10-CM | POA: Insufficient documentation

## 2012-04-13 DIAGNOSIS — R1310 Dysphagia, unspecified: Secondary | ICD-10-CM | POA: Insufficient documentation

## 2012-04-13 MED ORDER — SODIUM CHLORIDE 0.9 % IJ SOLN
10.0000 mL | Freq: Once | INTRAMUSCULAR | Status: AC
  Administered 2012-04-13: 10 mL via INTRAVENOUS

## 2012-04-13 NOTE — Progress Notes (Signed)
De-accesed RAC IV heplock, 2x2 gause placed over site and taped,patient instructed to leave on a few hours, when removing dressing gause if bleeding occurs secure dressing again with pressure, leave on till night then remove and place bandaid over site,patient tolerated well,no c/o pain 10:20 AM

## 2012-04-13 NOTE — Progress Notes (Signed)
Patient presents to the clinic today accompanied by his wife for nurse evaluation and IV start necessary for simulation. Patient alert and oriented to person, place, and time. No distress noted. Steady gait noted. Pleasant affect noted. Patient denies pain at this time. Provided patient with appointment calendar. Patient reports that he had his wisdom teeth removed Friday, teeth cleaned, and fluoride trays made. Started right AC 22 gauge IV on first attempt. Patient tolerated well. Excellent blood return and no difficulty flushing noted. Secured IV. Escorted patient to CT/SIM. Informed patient's wife of his status and length of time required for simulation.

## 2012-04-13 NOTE — Telephone Encounter (Signed)
Met w patient to discuss RO billing. Pt had no finaical concerns today. However, did advised that his is out of work at this time and will call and set up a payment plan, when necessary. Pt was not interested in FA, at this time.  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

## 2012-04-14 ENCOUNTER — Ambulatory Visit: Payer: 59 | Admitting: Physical Therapy

## 2012-04-14 ENCOUNTER — Ambulatory Visit: Payer: 59 | Attending: Otolaryngology | Admitting: Physical Therapy

## 2012-04-14 DIAGNOSIS — R5381 Other malaise: Secondary | ICD-10-CM | POA: Insufficient documentation

## 2012-04-14 DIAGNOSIS — IMO0001 Reserved for inherently not codable concepts without codable children: Secondary | ICD-10-CM | POA: Insufficient documentation

## 2012-04-15 ENCOUNTER — Encounter (HOSPITAL_COMMUNITY): Payer: Self-pay

## 2012-04-15 ENCOUNTER — Other Ambulatory Visit: Payer: Self-pay

## 2012-04-15 ENCOUNTER — Ambulatory Visit (HOSPITAL_COMMUNITY)
Admission: RE | Admit: 2012-04-15 | Discharge: 2012-04-15 | Disposition: A | Discharge status: Home / Self Care | Payer: 59 | Source: Ambulatory Visit | Attending: Radiation Oncology | Admitting: Radiation Oncology

## 2012-04-15 ENCOUNTER — Other Ambulatory Visit: Payer: Self-pay | Admitting: Radiation Oncology

## 2012-04-15 ENCOUNTER — Telehealth: Payer: Self-pay | Admitting: *Deleted

## 2012-04-15 ENCOUNTER — Ambulatory Visit: Payer: 59 | Admitting: Nutrition

## 2012-04-15 ENCOUNTER — Ambulatory Visit (HOSPITAL_BASED_OUTPATIENT_CLINIC_OR_DEPARTMENT_OTHER): Payer: 59 | Admitting: Oncology

## 2012-04-15 ENCOUNTER — Encounter: Payer: Self-pay | Admitting: Oncology

## 2012-04-15 VITALS — BP 138/88 | HR 101 | Temp 98.1°F | Resp 22 | Ht 73.0 in | Wt 281.8 lb

## 2012-04-15 DIAGNOSIS — Z931 Gastrostomy status: Secondary | ICD-10-CM

## 2012-04-15 DIAGNOSIS — K219 Gastro-esophageal reflux disease without esophagitis: Secondary | ICD-10-CM | POA: Insufficient documentation

## 2012-04-15 DIAGNOSIS — C029 Malignant neoplasm of tongue, unspecified: Secondary | ICD-10-CM

## 2012-04-15 DIAGNOSIS — C023 Malignant neoplasm of anterior two-thirds of tongue, part unspecified: Secondary | ICD-10-CM

## 2012-04-15 DIAGNOSIS — Z85810 Personal history of malignant neoplasm of tongue: Secondary | ICD-10-CM

## 2012-04-15 DIAGNOSIS — Z79899 Other long term (current) drug therapy: Secondary | ICD-10-CM | POA: Insufficient documentation

## 2012-04-15 DIAGNOSIS — E781 Pure hyperglyceridemia: Secondary | ICD-10-CM | POA: Insufficient documentation

## 2012-04-15 LAB — CBC
HCT: 43.8 % (ref 39.0–52.0)
RDW: 12.2 % (ref 11.5–15.5)
WBC: 7.9 10*3/uL (ref 4.0–10.5)

## 2012-04-15 LAB — PROTIME-INR: INR: 0.97 (ref 0.00–1.49)

## 2012-04-15 LAB — APTT: aPTT: 38 seconds — ABNORMAL HIGH (ref 24–37)

## 2012-04-15 MED ORDER — PROCHLORPERAZINE 25 MG RE SUPP: 25.0000 mg | Freq: Two times a day (BID) | RECTAL | Status: DC | PRN

## 2012-04-15 MED ORDER — MIDAZOLAM HCL 2 MG/2ML IJ SOLN
INTRAMUSCULAR | Status: AC | PRN
  Administered 2012-04-15 (×3): 1 mg via INTRAVENOUS

## 2012-04-15 MED ORDER — CEFAZOLIN SODIUM-DEXTROSE 2-3 GM-% IV SOLR
2.0000 g | INTRAVENOUS | Status: AC
  Administered 2012-04-15: 2 g via INTRAVENOUS
  Filled 2012-04-15: qty 50

## 2012-04-15 MED ORDER — LORAZEPAM 0.5 MG PO TABS: 0.5000 mg | ORAL_TABLET | Freq: Four times a day (QID) | ORAL | Status: DC | PRN

## 2012-04-15 MED ORDER — FENTANYL CITRATE 0.05 MG/ML IJ SOLN
INTRAMUSCULAR | Status: AC
  Filled 2012-04-15: qty 6

## 2012-04-15 MED ORDER — SODIUM CHLORIDE 0.9 % IV SOLN: INTRAVENOUS | Status: DC

## 2012-04-15 MED ORDER — GLUCAGON HCL (RDNA) 1 MG IJ SOLR
INTRAMUSCULAR | Status: AC
  Filled 2012-04-15: qty 1

## 2012-04-15 MED ORDER — LIDOCAINE HCL 1 % IJ SOLN
INTRAMUSCULAR | Status: AC
  Filled 2012-04-15: qty 20

## 2012-04-15 MED ORDER — PROCHLORPERAZINE MALEATE 10 MG PO TABS: 10.0000 mg | ORAL_TABLET | Freq: Four times a day (QID) | ORAL | Status: DC | PRN

## 2012-04-15 MED ORDER — ONDANSETRON HCL 8 MG PO TABS: 8.0000 mg | ORAL_TABLET | Freq: Two times a day (BID) | ORAL | Status: DC | PRN

## 2012-04-15 MED ORDER — MIDAZOLAM HCL 2 MG/2ML IJ SOLN
INTRAMUSCULAR | Status: AC
  Filled 2012-04-15: qty 6

## 2012-04-15 MED ORDER — FENTANYL CITRATE 0.05 MG/ML IJ SOLN
INTRAMUSCULAR | Status: AC | PRN
  Administered 2012-04-15 (×3): 50 ug via INTRAVENOUS

## 2012-04-15 MED ORDER — GLUCAGON HCL (RDNA) 1 MG IJ SOLR
1.0000 mg | INTRAVENOUS | Status: AC | PRN
  Administered 2012-04-15: 1 mg via INTRAVENOUS

## 2012-04-15 MED ORDER — IOHEXOL 300 MG/ML  SOLN
50.0000 mL | Freq: Once | INTRAMUSCULAR | Status: AC | PRN
  Administered 2012-04-15: 5 mL via INTRAVENOUS

## 2012-04-15 NOTE — Patient Instructions (Addendum)
Diagnosis:  Head and neck Squamous cell carcinoma:  - Potential causes:  Smoking, alcohol, human papilloma virus (HPV) - Stage:  To be determined; with PET scan.  - Prognosis:  Pending PET scan.  - Treatment Recommendation:  I strongly recommend concurrent chemoradiation to decrease the risk of recurrence of cancer.  Without chemoradiation, the chance of recurrence can be up to 50%.  If cancer recurs outside of the neck (such as lungs, bone), then it will not be curable.    - In preparation for treatment:    * Referral to Dr. Kristin Bruins, an oncologic dentist.   * See Dr. Pollyann Kennedy to see if neck surgery needs to be done.   * Referral to PET scan.   * Referral to Interventional Radiology for PEG tube (feeding tube) placement.   * Referral to Speech Pathology to learn about swallowing exercise to decrease risk of esophageal stricture.   * Referral to Nutritionist.  * Referral to Social Worker (per individual patient's request).  * Referral to chemo class to learn about practical tips while on chemo.   * Please pick up nausea medications from your preferred pharmacy.   * Referral to Audiologist for baseline audiogram.

## 2012-04-15 NOTE — Telephone Encounter (Signed)
CALLED PATIENT TO INFORM OF TEST FOR  04-17-12 AT 7:00 AM AT [facility], LVM FOR A RETURN CALL

## 2012-04-15 NOTE — Progress Notes (Signed)
[facility]  [phone] Fax:([phone]   MEDICAL ONCOLOGY - INITIAL CONSULATION    Referral MD:  Dr. Joline Maxcy, M.D.   Reason for Referral: Recurrent head neck cancer.  HPI: [name] is a 32 year old Caucasian man with history of smoking cigar and alcohol use. He developed persistent spots on the right side of the tongue in the spring of 2013. He underwent evaluation by Dr. 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 Margin was not involved. He underwent right neck dissection on 09/06/2011 with pathology case number [id number] showing 1 the 24 lymph nodes in levels one 2 and 3 containing squamous cell carcinoma. He also has reresection of the right tongue which showed ulcerated squamous mucosa with underlying acute and chronic inflammation, granulation tissue formation and giant cell reaction without tumor seen. He was on observation until earlier this year 2014 he noticed and also in the right side of the tongue.  On 03/23/2012, he underwent resection of the right tongue with pathology case number [id number] showed invasive well-differentiated squamous cell carcinoma, spanning 1.5 cm in greatest dimension. Margins were negative. There was no lymphovascular invasion. The tumor did invade into the underlying muscle. He was kindly referred to medical oncology for evaluation of adjuvant chemotherapy. Of note, he was referred to Dr. Roselind Messier from the radiation oncology to receive adjuvant  radiation therapy soon.  [name] presented to the clinic today for the first time with his grandmother and mother-in-law.  He reports that he has mild pain in the right side of the tongue due to recent surgery. He denies any palpable left neck swelling or right neck swelling. However, on the planning CT scan, the time that noticed that he had left neck  adenopathy.  He does have problem swallowing and his speech due to his hemiglossectomy in the past.  Patient denies fever, anorexia, weight loss, fatigue, headache, visual changes, confusion, drenching night sweats, palpable lymph node swelling, mucositis, odynophagia, dysphagia, nausea vomiting, jaundice, chest pain, palpitation, shortness of breath, dyspnea on exertion, productive cough, gum bleeding, epistaxis, hematemesis, hemoptysis, abdominal pain, abdominal swelling, early satiety, melena, hematochezia, hematuria, skin rash, spontaneous bleeding, joint swelling, joint pain, heat or cold intolerance, bowel bladder incontinence, back pain, focal motor weakness, paresthesia, depression.  He no longer smokes cigarettes, or drink alcohol.  He is 1 toddler daughter and the baby boy due in a few weeks     Past Medical History  Diagnosis Date  . High triglycerides   . Gout   . Staph skin infection     Elbow- resolved.    Marland Kitchen PONV (postoperative nausea and vomiting)   . Depression     "patient quit smoking was put on wellbutrin to assist"  . GERD (gastroesophageal reflux disease)   . Head and neck cancer     tongue cancer  :  Past Surgical History  Procedure Laterality Date  . Mouth surgery      biopsy- (08/06/2011 Dr. Barbette Merino), also had tissue graft (oral) 2010  . Glossectomy    . Glossectomy  08/22/2011    Procedure: GLOSSECTOMY;  Surgeon: Serena Colonel, MD;  Location: [facility] OR;  Service: ENT;  Laterality: Right;  WITH FROZEN SECTION  . Glossectomy  09/06/2011    Procedure: GLOSSECTOMY;  Surgeon: Serena Colonel, MD;  Location: [facility] OR;  Service: ENT;  Laterality: Right;  Right Tongue  Re-Excision  . Radical neck dissection  09/06/2011    Procedure: RADICAL NECK DISSECTION;  Surgeon: Serena Colonel, MD;  Location: [facility] OR;  Service: ENT;  Laterality: Right;  Right Modified Neck Dissection   . Hemiglossectomy N/A 03/23/2012    Procedure: BIOPSY OF TONGUE WITH FROZEN SECTION AND RIGHT HEMIGLOSSECTOMY;  Surgeon:  Serena Colonel, MD;  Location: [facility] OR;  Service: ENT;  Laterality: N/A;  . Tracheostomy tube placement N/A 03/23/2012    Procedure: TRACHEOSTOMY;  Surgeon: Serena Colonel, MD;  Location: [facility] OR;  Service: ENT;  Laterality: N/A;  . Tongue biopsy    :  Current Outpatient Prescriptions  Medication Sig Dispense Refill  . amoxicillin (AMOXIL) 500 MG capsule Take 500 mg by mouth 3 (three) times daily.       Marland Kitchen buPROPion (WELLBUTRIN SR) 150 MG 12 hr tablet Take 150 mg by mouth 2 (two) times daily.       . chlorhexidine (PERIDEX) 0.12 % solution Rinse with 15 mls twice daily for 30 seconds. Use after breakfast and at bedtime. Spit out excess. Do not swallow.  480 mL  prn  . HYDROcodone-acetaminophen (HYCET) 7.5-325 mg/15 ml solution Take 10-15 mLs by mouth every 4 (four) hours as needed.  480 mL  1  . ibuprofen (ADVIL,MOTRIN) 200 MG tablet Take 400 mg by mouth every 6 (six) hours as needed for pain.      . simvastatin (ZOCOR) 10 MG tablet Take 10 mg by mouth every evening.       . sodium fluoride (FLUORISHIELD) 1.1 % GEL dental gel Instill one drop of gel per tooth space of fluoride tray. Place over teeth for 5 minutes. Remove. Spit out excess. Repeat nightly.  120 mL  prn  . LORazepam (ATIVAN) 0.5 MG tablet Take 1 tablet (0.5 mg total) by mouth every 6 (six) hours as needed (anticipatory nausea or vomiting).  30 tablet  1  . ondansetron (ZOFRAN) 8 MG tablet Take 1 tablet (8 mg total) by mouth every 12 (twelve) hours as needed. Take two times a day as needed for nausea or vomiting starting on the third day after chemotherapy.  30 tablet  3  . prochlorperazine (COMPAZINE) 10 MG tablet Take 1 tablet (10 mg total) by mouth every 6 (six) hours as needed (Nausea or vomiting).  30 tablet  1  . prochlorperazine (COMPAZINE) 25 MG suppository Place 1 suppository (25 mg total) rectally every 12 (twelve) hours as needed for nausea.  12 suppository  3   No current facility-administered medications for this visit.    Facility-Administered Medications Ordered in Other Visits  Medication Dose Route Frequency Provider Last Rate Last Dose  . 0.9 %  sodium chloride infusion   Intravenous Continuous D Kevin Allred, PA-C         No Known Allergies:  Family History  Problem Relation Age of Onset  . Rheum arthritis Mother   . Asthma Father   . Cancer Maternal Grandmother   . Diabetes Sister   . Diabetes Brother   :  History   Social History  . Marital Status: Married    Spouse Name: N/A    Number of Children: 1  . Years of Education: N/A   Occupational History  .  [employer]    AC unit.    Social History Main Topics  . Smoking status: Former Smoker -- 15 years    Types: Cigars    Quit date: 08/14/2011  . Smokeless tobacco: Never Used  . Alcohol  Use: No     Comment: quit drinking 7/13 from 12 pack of beeer on weekends  . Drug Use: No  . Sexually Active: Yes   Other Topics Concern  . Not on file   Social History Narrative   Married.   History of smoking 5 small cigars daily but quit in 2013.   History of drinking approximately a 12 pack of beer on weekends but quit in July 2013.        :   Exam: ECOG 0  General:  well-nourished man, in no acute distress.  Eyes:  no scleral icterus.  ENT:  The right side of his tongue was resected without any obvious ulceration, bleeding, purulent discharge.  Neck was without thyromegaly.  Lymphatics:  Negative for supraclavicular or axillary adenopathy. His right neck dissection scar has healed up nicely. His left neck showed shotty adenopathy in levels II and III.   Respiratory: lungs were clear bilaterally without wheezing or crackles.  Cardiovascular:  Regular rate and rhythm, S1/S2, without murmur, rub or gallop.  There was no pedal edema.  GI:  abdomen was soft, flat, nontender, nondistended, without organomegaly.  Muscoloskeletal:  no spinal tenderness of palpation of vertebral spine.  Skin exam was without echymosis, petichae.  Neuro  exam was nonfocal.  Patient was able to get on and off exam table without assistance.  Gait was normal.  Patient was alerted and oriented.  Attention was good.   Language was appropriate.  Mood was normal without depression.  Speech was not pressured.  Thought content was not tangential.     Lab Results  Component Value Date   WBC 7.9 04/15/2012   HGB 14.8 04/15/2012   HCT 43.8 04/15/2012   PLT 340 04/15/2012   GLUCOSE 98 03/23/2012   ALT 29 09/28/2008   AST 24 09/28/2008   NA 137 03/23/2012   K 4.1 03/23/2012   CL 102 03/23/2012   CREATININE 0.91 03/23/2012   BUN 8 03/23/2012   CO2 25 03/23/2012     Assessment and Plan:    Diagnosis:  Head and neck Squamous cell carcinoma:  - Potential causes:  Smoking, alcohol.  - Stage:  To be determined; with PET scan given potential left neck adenopathy.  But he is at least stage III given history of right neck adenopathy. - Prognosis:  Pending PET scan.  - Treatment Recommendation:  I strongly recommend concurrent chemoradiation to decrease the risk of recurrence of cancer.  Without chemoradiation, the chance of recurrence can be up to 50%.  If cancer recurs outside of the neck (such as lungs, bone), then it will not be curable.   -  Dosed in adjuvant chemotherapy cisplatin 100 mg meter square given once every 3 weeks for 3 doses.  I discussed with patient side effects of cisplatin which include but not limited to nausea vomiting, alopecia, fatigue, mucositis, ototoxicity, nephrotoxocity, abnormal electrolytes, cytopenia, risk of bleeding and infection.   He expressed informed understanding wished to proceed with adjuvant chemoradiation therapy. However, if he shows metastatic disease on the PET scan, to be only chemotherapy for palliative purposes.  - In preparation for treatment:    * He has seen dental medicine  * He was see  Dr. Pollyann Kennedy  again to see if  left neck dissection  needs to be done need for adjuvant therapy .   * He will have PET scan later  this week.   *  He already had PEG tube (feeding tube) placement.   *  He had already seen a Speech Pathology to learn about swallowing exercise to decrease risk of esophageal stricture.   * He will see Nutritionist.  * He was referred to the wave of a 1 grade chemo class to learn about practical tips while on chemo.   * I prescribed the patient Compazine, Zofran, Ativan when necessary nausea vomiting.  * Referral to Audiologist for baseline audiogram.        The length of time of the face-to-face encounter was 60 minutes. More than 50% of time was spent counseling and coordination of care.     Thank you for this referral.

## 2012-04-15 NOTE — Procedures (Signed)
20 Fr pull through gastrostomy No comp 

## 2012-04-15 NOTE — H&P (Signed)
[name] is an 32 y.o. male.   Chief Complaint: "I'm here for a stomach tube" HPI: Patient with history of recurrent squamous cell carcinoma of tongue presents today for placement of a percutaneous gastrostomy tube prior to radiation therapy.  Past Medical History  Diagnosis Date  . High triglycerides   . Gout   . Staph skin infection     Elbow- resolved.    . Complication of anesthesia   . PONV (postoperative nausea and vomiting)   . Depression     "patient quit smoking was put on wellbutrin to assist"  . GERD (gastroesophageal reflux disease)   . Chronic kidney disease     Kidney Stone  . Cancer     tongue cancer    Past Surgical History  Procedure Laterality Date  . Mouth surgery      biopsy- (08/06/2011 Dr. Barbette Merino), also had tissue graft (oral) 2010  . Glossectomy    . Glossectomy  08/22/2011    Procedure: GLOSSECTOMY;  Surgeon: Serena Colonel, MD;  Location: [facility] OR;  Service: ENT;  Laterality: Right;  WITH FROZEN SECTION  . Glossectomy  09/06/2011    Procedure: GLOSSECTOMY;  Surgeon: Serena Colonel, MD;  Location: [facility] OR;  Service: ENT;  Laterality: Right;  Right Tongue Re-Excision  . Radical neck dissection  09/06/2011    Procedure: RADICAL NECK DISSECTION;  Surgeon: Serena Colonel, MD;  Location: [facility] OR;  Service: ENT;  Laterality: Right;  Right Modified Neck Dissection   . Hemiglossectomy N/A 03/23/2012    Procedure: BIOPSY OF TONGUE WITH FROZEN SECTION AND RIGHT HEMIGLOSSECTOMY;  Surgeon: Serena Colonel, MD;  Location: [facility] OR;  Service: ENT;  Laterality: N/A;  . Tracheostomy tube placement N/A 03/23/2012    Procedure: TRACHEOSTOMY;  Surgeon: Serena Colonel, MD;  Location: [facility] OR;  Service: ENT;  Laterality: N/A;  . Tongue biopsy      Family History  Problem Relation Age of Onset  . Rheum arthritis Mother   . Asthma Father   . Cancer Maternal Grandmother    Social History:  reports that he quit smoking about 8 months ago. His smoking use included Cigars. He has never used smokeless  tobacco. He reports that he does not drink alcohol or use illicit drugs.  Allergies: No Known Allergies  Current outpatient prescriptions:amoxicillin (AMOXIL) 500 MG capsule, Take 500 mg by mouth 3 (three) times daily. , Disp: , Rfl: ;  buPROPion (WELLBUTRIN SR) 150 MG 12 hr tablet, Take 150 mg by mouth 2 (two) times daily. , Disp: , Rfl: ;  chlorhexidine (PERIDEX) 0.12 % solution, Rinse with 15 mls twice daily for 30 seconds. Use after breakfast and at bedtime. Spit out excess. Do not swallow., Disp: 480 mL, Rfl: prn HYDROcodone-acetaminophen (HYCET) 7.5-325 mg/15 ml solution, Take 10-15 mLs by mouth every 4 (four) hours as needed., Disp: 480 mL, Rfl: 1;  ibuprofen (ADVIL,MOTRIN) 200 MG tablet, Take 400 mg by mouth every 6 (six) hours as needed for pain., Disp: , Rfl: ;  simvastatin (ZOCOR) 10 MG tablet, Take 10 mg by mouth every evening. , Disp: , Rfl:  sodium fluoride (FLUORISHIELD) 1.1 % GEL dental gel, Instill one drop of gel per tooth space of fluoride tray. Place over teeth for 5 minutes. Remove. Spit out excess. Repeat nightly., Disp: 120 mL, Rfl: prn Current facility-administered medications:0.9 %  sodium chloride infusion, , Intravenous, Continuous, D Kevin Delbert Darley, PA-C;  ceFAZolin (ANCEF) IVPB 2 g/50 mL premix, 2 g, Intravenous, On Call, D Jeananne Rama, PA-C;  lidocaine (XYLOCAINE) 1 % (with pres) injection, , , ,    Results for orders placed during the hospital encounter of 04/15/12 (from the past 48 hour(s))  APTT     Status: Abnormal   Collection Time    04/15/12  9:35 AM      Result Value Range   aPTT 38 (*) 24 - 37 seconds   Comment:            IF BASELINE aPTT IS ELEVATED,     SUGGEST PATIENT RISK ASSESSMENT     BE USED TO DETERMINE APPROPRIATE     ANTICOAGULANT THERAPY.     TEST CANCELLED PER MD  CBC     Status: None   Collection Time    04/15/12  9:35 AM      Result Value Range   WBC 7.9  4.0 - 10.5 K/uL   RBC 5.01  4.22 - 5.81 MIL/uL   Hemoglobin 14.8  13.0 - 17.0 g/dL    HCT 16.1  09.6 - 04.5 %   MCV 87.4  78.0 - 100.0 fL   MCH 29.5  26.0 - 34.0 pg   MCHC 33.8  30.0 - 36.0 g/dL   RDW 40.9  81.1 - 91.4 %   Platelets 340  150 - 400 K/uL  PROTIME-INR     Status: None   Collection Time    04/15/12  9:35 AM      Result Value Range   Prothrombin Time 12.8  11.6 - 15.2 seconds   Comment: TEST CANCELLED PER MD     PER DR. HA. AT 1000 ON 03.19.14 BY SHUEA   INR 0.97  0.00 - 1.49   Comment: TEST CANCELLED PER MD   No results found.  Review of Systems  Constitutional: Negative for fever and chills.  Respiratory: Negative for cough and shortness of breath.        Occ cough due to sinus congestion  Cardiovascular: Negative for chest pain.  Gastrointestinal: Negative for nausea, vomiting and abdominal pain.  Musculoskeletal: Negative for back pain.  Neurological: Negative for headaches.  Endo/Heme/Allergies: Does not bruise/bleed easily.    Blood pressure 142/89, pulse 73, temperature 98.3 F (36.8 C), resp. rate 20, SpO2 100.00%. Physical Exam  Constitutional: He is oriented to person, place, and time. He appears well-developed and well-nourished.  Cardiovascular: Normal rate and regular rhythm.   Respiratory: Effort normal and breath sounds normal.  GI: Soft. Bowel sounds are normal. There is no tenderness.  Musculoskeletal: Normal range of motion. He exhibits no edema.  Neurological: He is alert and oriented to person, place, and time.     Assessment/Plan Pt with recurrent squamous cell carcinoma of tongue. Plan is for placement of a percutaneous gastrostomy tube today prior to radiation therapy. Details/risks of procedure d/w pt/family with their understanding and consent.  Capri Veals,D KEVIN 04/15/2012, 10:11 AM

## 2012-04-15 NOTE — Progress Notes (Signed)
Patient is a 32 year old male diagnosed with recurrent tongue cancer to receive radiation therapy. He is meeting with medical oncologist to decide on chemotherapy plan.  Past medical history includes hypertriglyceridemia, gout, postop nausea vomiting, depression, GERD, chronic kidney disease, and tobacco.  Medications include Wellbutrin, peridex, Zocor.   Labs were reviewed.  Height: 6 feet 2 inches. Weight: 286 pounds. Usual body weight: 290 pounds. BMI: 36.7. (obese)  Estimated nutrition needs: 2500-2750 calories, 130-145 g protein, 2.8 L fluid.  Patient is status post PEG placement. He reports he has not yet decided on chemotherapy, but he does start his radiation next Wednesday. Patient states he is overwhelmed with all the information he has received. He is looking for just basic information because he does not want to feel overloaded.  Nutrition diagnosis: Food and nutrition related knowledge deficit related to new diagnosis of recurrent tongue cancer and associated treatments as evidenced by no prior need for nutrition related information.  Intervention: I educated patient on the importance of small, frequent meals with high-calorie, high-protein foods to promote weight maintenance. We have discussed how to blenderize foods for easier swallowing. I've encouraged him to try oral nutrition supplements to provide additional calories and protein. I've educated him on the importance of weight maintenance throughout treatment. I have provided fact sheets and samples for him to try. He has my contact information. Teach back method used.  Monitoring, evaluation, goals: Patient will tolerate adequate calories and protein to minimize weight loss. If patient loses 5% of starting weight, I will initiate tube feedings.  Next visit: I will followup with patient once treatment starts.

## 2012-04-16 ENCOUNTER — Other Ambulatory Visit: Payer: 59

## 2012-04-16 ENCOUNTER — Other Ambulatory Visit: Payer: Self-pay | Admitting: Certified Registered Nurse Anesthetist

## 2012-04-17 ENCOUNTER — Other Ambulatory Visit: Payer: Self-pay | Admitting: Certified Registered Nurse Anesthetist

## 2012-04-17 ENCOUNTER — Encounter (HOSPITAL_COMMUNITY)
Admission: RE | Admit: 2012-04-17 | Discharge: 2012-04-17 | Disposition: A | Discharge status: Home / Self Care | Payer: 59 | Source: Ambulatory Visit | Attending: Radiation Oncology | Admitting: Radiation Oncology

## 2012-04-17 ENCOUNTER — Encounter (HOSPITAL_COMMUNITY): Payer: Self-pay

## 2012-04-17 DIAGNOSIS — C023 Malignant neoplasm of anterior two-thirds of tongue, part unspecified: Secondary | ICD-10-CM | POA: Insufficient documentation

## 2012-04-17 LAB — GLUCOSE, CAPILLARY: Glucose-Capillary: 104 mg/dL — ABNORMAL HIGH (ref 70–99)

## 2012-04-17 MED ORDER — FLUDEOXYGLUCOSE F - 18 (FDG) INJECTION
17.5000 | Freq: Once | INTRAVENOUS | Status: AC | PRN
  Administered 2012-04-17: 17.5 via INTRAVENOUS

## 2012-04-20 ENCOUNTER — Encounter (HOSPITAL_COMMUNITY): Payer: Self-pay | Admitting: Pharmacy Technician

## 2012-04-21 NOTE — Pre-Procedure Instructions (Signed)
DURANT SCIBILIA  04/21/2012   Your procedure is scheduled on:  Thursday, March 27th  Report to [facility] at 0845 AM.  Call this number if you have problems the morning of surgery: [phone]   Remember:   Do not eat food or drink liquids after midnight.    Take these medicines the morning of surgery with A SIP OF WATER: Wellbutrin, hycet if needed, ativan if needed,    Do not wear jewelry, make-up or nail polish.  Do not wear lotions, powders, or perfumes,deodorant.  Do not shave 48 hours prior to surgery. Men may shave face and neck.  Do not bring valuables to the hospital.  Contacts, dentures or bridgework may not be worn into surgery.  Leave suitcase in the car. After surgery it may be brought to your room.  For patients admitted to the hospital, checkout time is 11:00 AM the day of discharge.   Patients discharged the day of surgery will not be allowed to drive home.   Special Instructions: Shower using CHG 2 nights before surgery and the night before surgery.  If you shower the day of surgery use CHG.  Use special wash - you have one bottle of CHG for all showers.  You should use approximately 1/3 of the bottle for each shower.   Please read over the following fact sheets that you were given: Pain Booklet, Coughing and Deep Breathing, MRSA Information and Surgical Site Infection Prevention

## 2012-04-22 ENCOUNTER — Ambulatory Visit: Payer: 59 | Admitting: Radiation Oncology

## 2012-04-22 ENCOUNTER — Encounter (HOSPITAL_COMMUNITY)
Admission: RE | Admit: 2012-04-22 | Discharge: 2012-04-22 | Disposition: A | Discharge status: Home / Self Care | Payer: 59 | Source: Ambulatory Visit | Attending: Otolaryngology | Admitting: Otolaryngology

## 2012-04-22 ENCOUNTER — Ambulatory Visit: Payer: Self-pay

## 2012-04-22 ENCOUNTER — Encounter (HOSPITAL_COMMUNITY): Payer: Self-pay

## 2012-04-22 HISTORY — DX: Urinary calculus, unspecified: N20.9

## 2012-04-22 LAB — CBC
Platelets: 294 10*3/uL (ref 150–400)
RDW: 12 % (ref 11.5–15.5)
WBC: 7.8 10*3/uL (ref 4.0–10.5)

## 2012-04-22 LAB — BASIC METABOLIC PANEL
Calcium: 9.5 mg/dL (ref 8.4–10.5)
Chloride: 104 mEq/L (ref 96–112)
Creatinine, Ser: 0.8 mg/dL (ref 0.50–1.35)
GFR calc Af Amer: >90 mL/min (ref >90)
Sodium: 142 mEq/L (ref 135–145)

## 2012-04-22 LAB — SURGICAL PCR SCREEN: Staphylococcus aureus: NEGATIVE

## 2012-04-22 MED ORDER — CEFAZOLIN SODIUM 10 G IJ SOLR
3.0000 g | INTRAMUSCULAR | Status: AC
  Administered 2012-04-23: 3 g via INTRAVENOUS
  Filled 2012-04-22: qty 3000

## 2012-04-22 NOTE — Progress Notes (Signed)
Orders req'd by Jamesetta Orleans

## 2012-04-22 NOTE — Pre-Procedure Instructions (Addendum)
[name]  04/22/2012   Your procedure is scheduled on:  Thursday, March 27th  Report to [facility] at 0845 AM.  Call this number if you have problems the morning of surgery: [phone]   Remember:   Do not eat food or drink liquids after midnight.    Take these medicines the morning of surgery with A SIP OF WATER: Wellbutrin, hycet if needed, ativan if needed,    Do not wear jewelry, make-up or nail polish.  Do not wear lotions, powders, or perfumes,deodorant.  Do not shave 48 hours prior to surgery. Men may shave face and neck.  Do not bring valuables to the hospital.  Contacts, dentures or bridgework may not be worn into surgery.  Leave suitcase in the car. After surgery it may be brought to your room.  For patients admitted to the hospital, checkout time is 11:00 AM the day of discharge.   Patients discharged the day of surgery will not be allowed to drive home.   Special Instructions: Shower using CHG 2 nights before surgery and the night before surgery.  If you shower the day of surgery use CHG.  Use special wash - you have one bottle of CHG for all showers.  You should use approximately 1/3 of the bottle for each shower.   Please read over the following fact sheets that you were given: Pain Booklet, Coughing and Deep Breathing, MRSA Information and Surgical Site Infection Prevention

## 2012-04-22 NOTE — Interval H&P Note (Signed)
History and Physical Interval Note:  04/22/2012 12:31 PM  [name]  has presented today for surgery, with the diagnosis of SQUAMOUS CELL CARCANOMA OF TONGUE  The various methods of treatment have been discussed with the patient and family. After consideration of risks, benefits and other options for treatment, the patient has consented to  Procedure(s): LEFT MODIFIED  NECK DISSECTION (Left) as a surgical intervention .  The patient's history has been reviewed, patient examined, no change in status, stable for surgery.  I have reviewed the patient's chart and labs.  Questions were answered to the patient's satisfaction.     Lysha Schrade

## 2012-04-22 NOTE — H&P (View-Only) (Signed)
No new complaints, he has been unable to swallow anything. Tach capped, no problems breathing, and he is talking much better. Tongue looks good.   Keep trach capped, decannulate tomorrow. Speech/swallow eval today.

## 2012-04-23 ENCOUNTER — Encounter (HOSPITAL_COMMUNITY)
Admission: RE | Disposition: A | Discharge status: Home / Self Care | Payer: Self-pay | Source: Ambulatory Visit | Attending: Otolaryngology

## 2012-04-23 ENCOUNTER — Observation Stay (HOSPITAL_COMMUNITY)
Admission: RE | Admit: 2012-04-23 | Discharge: 2012-04-24 | Disposition: A | Discharge status: Home / Self Care | Payer: 59 | Source: Ambulatory Visit | Attending: Otolaryngology | Admitting: Otolaryngology

## 2012-04-23 ENCOUNTER — Encounter (HOSPITAL_COMMUNITY): Payer: Self-pay | Admitting: Anesthesiology

## 2012-04-23 ENCOUNTER — Ambulatory Visit: Payer: 59

## 2012-04-23 ENCOUNTER — Encounter (HOSPITAL_COMMUNITY): Payer: Self-pay | Admitting: Surgery

## 2012-04-23 ENCOUNTER — Ambulatory Visit (HOSPITAL_COMMUNITY): Payer: 59 | Admitting: Anesthesiology

## 2012-04-23 DIAGNOSIS — Z01812 Encounter for preprocedural laboratory examination: Secondary | ICD-10-CM | POA: Insufficient documentation

## 2012-04-23 DIAGNOSIS — C770 Secondary and unspecified malignant neoplasm of lymph nodes of head, face and neck: Principal | ICD-10-CM | POA: Insufficient documentation

## 2012-04-23 DIAGNOSIS — C029 Malignant neoplasm of tongue, unspecified: Secondary | ICD-10-CM | POA: Insufficient documentation

## 2012-04-23 HISTORY — PX: RADICAL NECK DISSECTION: SHX2284

## 2012-04-23 SURGERY — DISSECTION, NECK, RADICAL
Anesthesia: General | Site: Neck | Laterality: Left | Wound class: Clean

## 2012-04-23 MED ORDER — ROCURONIUM BROMIDE 100 MG/10ML IV SOLN
INTRAVENOUS | Status: DC | PRN
  Administered 2012-04-23: 50 mg via INTRAVENOUS
  Administered 2012-04-23: 5 mg via INTRAVENOUS

## 2012-04-23 MED ORDER — BACITRACIN ZINC 500 UNIT/GM EX OINT
1.0000 "application " | TOPICAL_OINTMENT | Freq: Three times a day (TID) | CUTANEOUS | Status: DC
  Administered 2012-04-23 – 2012-04-24 (×4): 1 via TOPICAL
  Filled 2012-04-23: qty 15

## 2012-04-23 MED ORDER — ONDANSETRON HCL 4 MG PO TABS: 8.0000 mg | ORAL_TABLET | Freq: Two times a day (BID) | ORAL | Status: DC | PRN

## 2012-04-23 MED ORDER — BACITRACIN ZINC 500 UNIT/GM EX OINT
TOPICAL_OINTMENT | CUTANEOUS | Status: DC | PRN
  Administered 2012-04-23: 1 via TOPICAL

## 2012-04-23 MED ORDER — PROPOFOL 10 MG/ML IV BOLUS
INTRAVENOUS | Status: DC | PRN
  Administered 2012-04-23: 290 mg via INTRAVENOUS

## 2012-04-23 MED ORDER — HYDROCODONE-ACETAMINOPHEN 7.5-325 MG/15ML PO SOLN: 10.0000 mL | ORAL | Status: DC | PRN

## 2012-04-23 MED ORDER — PROCHLORPERAZINE MALEATE 10 MG PO TABS
10.0000 mg | ORAL_TABLET | Freq: Four times a day (QID) | ORAL | Status: DC | PRN
  Filled 2012-04-23: qty 1

## 2012-04-23 MED ORDER — FENTANYL CITRATE 0.05 MG/ML IJ SOLN
INTRAMUSCULAR | Status: DC | PRN
  Administered 2012-04-23 (×5): 50 ug via INTRAVENOUS
  Administered 2012-04-23: 100 ug via INTRAVENOUS
  Administered 2012-04-23 (×5): 50 ug via INTRAVENOUS

## 2012-04-23 MED ORDER — MORPHINE SULFATE 2 MG/ML IJ SOLN
2.0000 mg | INTRAMUSCULAR | Status: DC | PRN
  Administered 2012-04-23 – 2012-04-24 (×3): 2 mg via INTRAVENOUS
  Filled 2012-04-23 (×3): qty 1

## 2012-04-23 MED ORDER — HYDROMORPHONE HCL PF 1 MG/ML IJ SOLN: 0.2500 mg | INTRAMUSCULAR | Status: DC | PRN

## 2012-04-23 MED ORDER — ONDANSETRON HCL 4 MG/2ML IJ SOLN: 4.0000 mg | Freq: Once | INTRAMUSCULAR | Status: DC | PRN

## 2012-04-23 MED ORDER — BUPROPION HCL ER (SR) 150 MG PO TB12
150.0000 mg | ORAL_TABLET | Freq: Two times a day (BID) | ORAL | Status: DC
Start: 2012-04-23 — End: 2012-04-24
  Administered 2012-04-24: 150 mg via ORAL
  Filled 2012-04-23 (×3): qty 1

## 2012-04-23 MED ORDER — ONDANSETRON HCL 4 MG/2ML IJ SOLN
INTRAMUSCULAR | Status: DC | PRN
  Administered 2012-04-23: 4 mg via INTRAVENOUS

## 2012-04-23 MED ORDER — MIDAZOLAM HCL 5 MG/5ML IJ SOLN
INTRAMUSCULAR | Status: DC | PRN
  Administered 2012-04-23: 2 mg via INTRAVENOUS

## 2012-04-23 MED ORDER — LIDOCAINE-EPINEPHRINE (PF) 1 %-1:200000 IJ SOLN
INTRAMUSCULAR | Status: AC
  Filled 2012-04-23: qty 10

## 2012-04-23 MED ORDER — PHENOL 1.4 % MT LIQD
2.0000 | Freq: Three times a day (TID) | OROMUCOSAL | Status: DC | PRN
  Filled 2012-04-23: qty 177

## 2012-04-23 MED ORDER — LACTATED RINGERS IV SOLN
INTRAVENOUS | Status: DC
  Administered 2012-04-23 (×3): via INTRAVENOUS

## 2012-04-23 MED ORDER — IBUPROFEN 400 MG PO TABS
400.0000 mg | ORAL_TABLET | Freq: Four times a day (QID) | ORAL | Status: DC | PRN
  Filled 2012-04-23: qty 1

## 2012-04-23 MED ORDER — BACITRACIN ZINC 500 UNIT/GM EX OINT
TOPICAL_OINTMENT | CUTANEOUS | Status: AC
  Filled 2012-04-23: qty 15

## 2012-04-23 MED ORDER — SIMVASTATIN 10 MG PO TABS
10.0000 mg | ORAL_TABLET | Freq: Every evening | ORAL | Status: DC
Start: 2012-04-23 — End: 2012-04-24
  Filled 2012-04-23 (×2): qty 1

## 2012-04-23 MED ORDER — 0.9 % SODIUM CHLORIDE (POUR BTL) OPTIME
TOPICAL | Status: DC | PRN
  Administered 2012-04-23: 1000 mL

## 2012-04-23 MED ORDER — ONDANSETRON HCL 4 MG/2ML IJ SOLN
4.0000 mg | Freq: Four times a day (QID) | INTRAMUSCULAR | Status: DC | PRN
  Administered 2012-04-23: 4 mg via INTRAVENOUS
  Filled 2012-04-23: qty 2

## 2012-04-23 MED ORDER — DEXTROSE-NACL 5-0.9 % IV SOLN
INTRAVENOUS | Status: DC
  Administered 2012-04-23 – 2012-04-24 (×2): via INTRAVENOUS

## 2012-04-23 MED ORDER — ACETAMINOPHEN 80 MG PO CHEW
320.0000 mg | CHEWABLE_TABLET | ORAL | Status: DC | PRN
  Filled 2012-04-23: qty 4

## 2012-04-23 MED ORDER — LIDOCAINE-EPINEPHRINE 1 %-1:100000 IJ SOLN: INTRAMUSCULAR | Status: DC | PRN

## 2012-04-23 MED ORDER — LIDOCAINE-EPINEPHRINE 1 %-1:100000 IJ SOLN
INTRAMUSCULAR | Status: AC
  Filled 2012-04-23: qty 1

## 2012-04-23 MED ORDER — SUCCINYLCHOLINE CHLORIDE 20 MG/ML IJ SOLN
INTRAMUSCULAR | Status: DC | PRN
  Administered 2012-04-23: 100 mg via INTRAVENOUS

## 2012-04-23 MED ORDER — PROCHLORPERAZINE 25 MG RE SUPP
25.0000 mg | Freq: Two times a day (BID) | RECTAL | Status: DC | PRN
  Filled 2012-04-23: qty 1

## 2012-04-23 MED ORDER — LORAZEPAM 0.5 MG PO TABS: 0.5000 mg | ORAL_TABLET | Freq: Four times a day (QID) | ORAL | Status: DC | PRN

## 2012-04-23 SURGICAL SUPPLY — 54 items
APPLIER CLIP 9.375 SM OPEN (CLIP)
CANISTER SUCTION 2500CC (MISCELLANEOUS) ×2 IMPLANT
CLEANER TIP ELECTROSURG 2X2 (MISCELLANEOUS) ×2 IMPLANT
CLIP APPLIE 9.375 SM OPEN (CLIP) IMPLANT
CLOTH BEACON ORANGE TIMEOUT ST (SAFETY) ×2 IMPLANT
CONNECTOR UNIV Y (MISCELLANEOUS) IMPLANT
CONT SPEC 4OZ CLIKSEAL STRL BL (MISCELLANEOUS) ×4 IMPLANT
CORDS BIPOLAR (ELECTRODE) ×2 IMPLANT
COVER SURGICAL LIGHT HANDLE (MISCELLANEOUS) ×2 IMPLANT
DRAIN CHANNEL 15F RND FF W/TCR (WOUND CARE) ×2 IMPLANT
DRAPE INCISE 23X17 IOBAN STRL (DRAPES)
DRAPE INCISE IOBAN 23X17 STRL (DRAPES) IMPLANT
ELECT COATED BLADE 2.86 ST (ELECTRODE) ×2 IMPLANT
ELECT REM PT RETURN 9FT ADLT (ELECTROSURGICAL) ×2
ELECTRODE REM PT RTRN 9FT ADLT (ELECTROSURGICAL) ×1 IMPLANT
EVACUATOR SILICONE 100CC (DRAIN) ×2 IMPLANT
GAUZE SPONGE 4X4 16PLY XRAY LF (GAUZE/BANDAGES/DRESSINGS) ×6 IMPLANT
GLOVE BIOGEL PI IND STRL 6.5 (GLOVE) ×1 IMPLANT
GLOVE BIOGEL PI IND STRL 7.0 (GLOVE) ×2 IMPLANT
GLOVE BIOGEL PI IND STRL 7.5 (GLOVE) ×1 IMPLANT
GLOVE BIOGEL PI INDICATOR 6.5 (GLOVE) ×1
GLOVE BIOGEL PI INDICATOR 7.0 (GLOVE) ×2
GLOVE BIOGEL PI INDICATOR 7.5 (GLOVE) ×1
GLOVE ECLIPSE 6.5 STRL STRAW (GLOVE) ×4 IMPLANT
GLOVE ECLIPSE 7.5 STRL STRAW (GLOVE) ×2 IMPLANT
GLOVE SURG SS PI 6.5 STRL IVOR (GLOVE) ×2 IMPLANT
GLOVE SURG SS PI 7.0 STRL IVOR (GLOVE) ×2 IMPLANT
GOWN STRL NON-REIN LRG LVL3 (GOWN DISPOSABLE) ×12 IMPLANT
KIT BASIN OR (CUSTOM PROCEDURE TRAY) ×2 IMPLANT
KIT ROOM TURNOVER OR (KITS) ×2 IMPLANT
LOCATOR NERVE 3 VOLT (DISPOSABLE) IMPLANT
NEEDLE HYPO 25GX1X1/2 BEV (NEEDLE) ×2 IMPLANT
NS IRRIG 1000ML POUR BTL (IV SOLUTION) ×2 IMPLANT
PAD ARMBOARD 7.5X6 YLW CONV (MISCELLANEOUS) ×4 IMPLANT
PENCIL FOOT CONTROL (ELECTRODE) ×2 IMPLANT
SPECIMEN JAR MEDIUM (MISCELLANEOUS) IMPLANT
SPONGE INTESTINAL PEANUT (DISPOSABLE) IMPLANT
SPONGE LAP 18X18 X RAY DECT (DISPOSABLE) IMPLANT
STAPLER VISISTAT 35W (STAPLE) ×2 IMPLANT
SUT CHROMIC 3 0 SH 27 (SUTURE) ×2 IMPLANT
SUT CHROMIC 5 0 P 3 (SUTURE) IMPLANT
SUT ETHILON 3 0 PS 1 (SUTURE) ×2 IMPLANT
SUT ETHILON 5 0 PS 2 18 (SUTURE) IMPLANT
SUT SILK 2 0 REEL (SUTURE) IMPLANT
SUT SILK 2 0 SH (SUTURE) ×4 IMPLANT
SUT SILK 3 0 SH CR/8 (SUTURE) ×2 IMPLANT
SUT SILK 4 0 REEL (SUTURE) ×4 IMPLANT
SUT VIC AB 3-0 SH 18 (SUTURE) IMPLANT
TOWEL OR 17X24 6PK STRL BLUE (TOWEL DISPOSABLE) ×2 IMPLANT
TOWEL OR 17X26 10 PK STRL BLUE (TOWEL DISPOSABLE) ×2 IMPLANT
TRAY ENT MC OR (CUSTOM PROCEDURE TRAY) ×2 IMPLANT
TRAY FOLEY CATH 14FRSI W/METER (CATHETERS) IMPLANT
TUBE FEEDING 10FR FLEXIFLO (MISCELLANEOUS) IMPLANT
WATER STERILE IRR 1000ML POUR (IV SOLUTION) ×2 IMPLANT

## 2012-04-23 NOTE — Transfer of Care (Signed)
Immediate Anesthesia Transfer of Care Note  Patient: [name]  Procedure(s) Performed: Procedure(s) with comments: LEFT MODIFIED  NECK DISSECTION (Left) - Left Supra Omo Hyoid Neck Dissection   Patient Location: PACU  Anesthesia Type:General  Level of Consciousness: awake, alert  and oriented  Airway & Oxygen Therapy: Patient Spontanous Breathing and Patient connected to nasal cannula oxygen  Post-op Assessment: Report given to PACU RN, Post -op Vital signs reviewed and stable and Patient moving all extremities  Post vital signs: Reviewed and stable  Complications: No apparent anesthesia complications

## 2012-04-23 NOTE — Anesthesia Preprocedure Evaluation (Addendum)
Anesthesia Evaluation  Patient identified by MRN, date of birth, ID band Patient awake    Reviewed: Allergy & Precautions, H&P , NPO status , Patient's Chart, lab work & pertinent test results  Airway Mallampati: II TM Distance: >3 FB Neck ROM: full   Comment: S/p hemiglossectomy and tracheostomy (healed now) in February.  At the time of that surgery was a Grade 1 view with video laryngoscope.  Janina Mayo was performed after intubation. Dental  (+) Dental Advisory Given and Teeth Intact   Pulmonary          Cardiovascular Rhythm:regular Rate:Normal     Neuro/Psych    GI/Hepatic GERD-  ,  Endo/Other    Renal/GU      Musculoskeletal   Abdominal   Peds  Hematology   Anesthesia Other Findings   Reproductive/Obstetrics                          Anesthesia Physical Anesthesia Plan  ASA: I  Anesthesia Plan: General   Post-op Pain Management:    Induction: Intravenous  Airway Management Planned: Oral ETT and Nasal ETT  Additional Equipment:   Intra-op Plan:   Post-operative Plan: Extubation in OR  Informed Consent: I have reviewed the patients History and Physical, chart, labs and discussed the procedure including the risks, benefits and alternatives for the proposed anesthesia with the patient or authorized representative who has indicated his/her understanding and acceptance.     Plan Discussed with: CRNA, Anesthesiologist and Surgeon  Anesthesia Plan Comments:         Anesthesia Quick Evaluation

## 2012-04-23 NOTE — Preoperative (Signed)
Beta Blockers   Reason not to administer Beta Blockers:Not Applicable 

## 2012-04-23 NOTE — Op Note (Signed)
OPERATIVE REPORT  DATE OF SURGERY: 04/23/2012  PATIENT:  [name],  32 y.o. male  PRE-OPERATIVE DIAGNOSIS:  SQUAMOUS CELL CARCANOMA OF TONGUE  POST-OPERATIVE DIAGNOSIS:  SQUAMOUS CELL CARCANOMA OF TONGUE  PROCEDURE:  Procedure(s): LEFT MODIFIED  NECK DISSECTION  SURGEON:  Susy Frizzle, MD  ASSISTANTS: Aquilla Hacker, PA  ANESTHESIA:   General   EBL:  50 ml  DRAINS: 15 Fr JP  LOCAL MEDICATIONS USED:  None  SPECIMEN:  Left supraomohyoid neck dissection  COUNTS:  Correct  PROCEDURE DETAILS: The patient was taken to the operating room and placed on the operating table in the supine position. Following induction of general endotracheal anesthesia, the left side of the neck was prepped and draped in a standard fashion. A left hemi-apron incision was outlined with a marking pen to match the corresponding incision on the contralateral side. Electrocautery was used to incise the skin and subcutaneous tissue and through the platysma muscle. Subplatysmal flaps were elevated up towards the mandible and inferiorly to the clavicle. Ibach sutures were used to keep the upper flap in place. Levels 1, 2 and 3 were dissected on the left side. The submandibular triangle was dissected first. There were several small nodes in this area. The marginal mandibular branch of the facial nerve was identified and preserved and reflected superiorly. The facial vessels were ligated between clamps and divided. The gland was brought inferiorly exposing the mylohyoid muscle. This was reflected up superiorly exposing the lingual nerve, the ganglion and the duct. The lingual nerve was preserved the remaining structures were sacrificed and ligated between clamps. The fibrofatty tissue around the anterior belly of the digastric muscles was dissected out and brought inferiorly with the submandibular contents. The greater regular nerve was identified as the initial skin flap was developed. The parotid tail was cleaned  of surrounding tissue. The upper neck fibrofatty tissue was dissected off the posterior belly of the digastric muscle. The external jugular vein was preserved. The fibrous tissue anterior to this was dissected off of the lateral aspect of the sternocleidomastoid muscle. Tissue was dissected off the medial side exposing the spinal accessory nerve. This was dissected out all way up to the base of skull. There was a large lymph node just inferior to the nerve. The fibrofatty tissue posterior and superior to the nerve was dissected off of the splenius capitis and levator scapulae and brought underneath the nerve. As the dissection was continued anteriorly the contents of the carotid sheath were identified. The carotid, internal jugular, and vagus nerve were all preserved and dissected cleaned of surrounding fibrofatty tissue. The lower limit of dissection was the omohyoid muscle. There was a suspected lymphatic.that was ligated between clamps and divided inferiorly. The specimen was marked with marking sutures, single suture marking level one and double suture marking level II. Specimen was delivered. The wound was irrigated with saline. Hemostasis was completed. The drain was exited through status separate stab incision and secured in place. Platysmal layer was reapproximated with interrupted chromic suture and skin staples were used on the skin. Patient was awakened, extubated and transferred to recovery in stable condition.    PATIENT DISPOSITION:  To PACU, stable

## 2012-04-23 NOTE — Anesthesia Postprocedure Evaluation (Signed)
  Anesthesia Post-op Note  Patient: [name]  Procedure(s) Performed: Procedure(s) with comments: LEFT MODIFIED  NECK DISSECTION (Left) - Left Supra Omo Hyoid Neck Dissection   Patient Location: PACU  Anesthesia Type:General  Level of Consciousness: awake, oriented and patient cooperative  Airway and Oxygen Therapy: Patient Spontanous Breathing  Post-op Pain: mild  Post-op Assessment: Post-op Vital signs reviewed, Patient's Cardiovascular Status Stable, Respiratory Function Stable, Patent Airway, No signs of Nausea or vomiting and Pain level controlled  Post-op Vital Signs: stable  Complications: No apparent anesthesia complications

## 2012-04-23 NOTE — Progress Notes (Signed)
Subjective: POD#1 from left salvage neck dissection for oral cavity SCC  Objective: Vital signs in last 24 hours: Temp:  [97 F (36.1 C)-98.2 F (36.8 C)] 98.2 F (36.8 C) (03/27 1621) Pulse Rate:  [87-103] 101 (03/27 1621) Resp:  [12-23] 20 (03/27 1621) BP: (115-147)/(47-97) 128/76 mmHg (03/27 1621) SpO2:  [96 %-100 %] 96 % (03/27 1621) Weight:  [119.4 kg (263 lb 3.7 oz)] 119.4 kg (263 lb 3.7 oz) (03/27 1621)  Left neck soft, JP with serosanguinous drainage, no hematoma, incision clean, dry and intact with staples. JP holding suction  @LABLAST2 (wbc:2,hgb:2,hct:2,plt:2)  Recent Labs  04/22/12 0830  NA 142  K 4.0  CL 104  CO2 25  GLUCOSE 90  BUN 11  CREATININE 0.80  CALCIUM 9.5    Medications: Current facility-administered medications:acetaminophen (TYLENOL) chewable tablet 320 mg, 320 mg, Oral, Q4H PRN, Melvenia Beam, MD;  bacitracin ointment 1 application, 1 application, Topical, Q8H, Serena Colonel, MD;  buPROPion [facility] SR) 12 hr tablet 150 mg, 150 mg, Oral, BID, Serena Colonel, MD;  dextrose 5 %-0.9 % sodium chloride infusion, , Intravenous, Continuous, Serena Colonel, MD, Last Rate: 75 mL/hr at 04/23/12 1632 HYDROcodone-acetaminophen (HYCET) 7.5-325 mg/15 ml solution 10-15 mL, 10-15 mL, Oral, Q4H PRN, Serena Colonel, MD;  ibuprofen (ADVIL,MOTRIN) tablet 400 mg, 400 mg, Oral, Q6H PRN, Serena Colonel, MD;  lactated ringers infusion, , Intravenous, Continuous, Kipp Brood, MD;  LORazepam (ATIVAN) tablet 0.5 mg, 0.5 mg, Oral, Q6H PRN, Serena Colonel, MD;  morphine 2 MG/ML injection 2 mg, 2 mg, Intravenous, Q4H PRN, Melvenia Beam, MD ondansetron [facility]) injection 4 mg, 4 mg, Intravenous, Q6H PRN, Melvenia Beam, MD;  ondansetron (ZOFRAN) tablet 8 mg, 8 mg, Oral, Q12H PRN, Serena Colonel, MD;  phenol (CHLORASEPTIC) mouth spray 2 spray, 2 spray, Mouth/Throat, TID PRN, Melvenia Beam, MD;  prochlorperazine (COMPAZINE) suppository 25 mg, 25 mg, Rectal, Q12H PRN, Serena Colonel, MD;  prochlorperazine  (COMPAZINE) tablet 10 mg, 10 mg, Oral, Q6H PRN, Serena Colonel, MD simvastatin (ZOCOR) tablet 10 mg, 10 mg, Oral, QPM, Serena Colonel, MD  Assessment/Plan: Doing well POD#0 from left neck dissection, monitor drain output, pain meds PRN, will monitor   LOS: 0 days   Melvenia Beam 04/23/2012, 5:10 PM

## 2012-04-23 NOTE — Interval H&P Note (Signed)
History and Physical Interval Note:  04/23/2012 10:49 AM  [name]  has presented today for surgery, with the diagnosis of SQUAMOUS CELL CARCANOMA OF TONGUE  The various methods of treatment have been discussed with the patient and family. After consideration of risks, benefits and other options for treatment, the patient has consented to  Procedure(s): LEFT MODIFIED  NECK DISSECTION (Left) as a surgical intervention .  The patient's history has been reviewed, patient examined, no change in status, stable for surgery.  I have reviewed the patient's chart and labs.  Questions were answered to the patient's satisfaction.     Charletta Voight

## 2012-04-24 ENCOUNTER — Encounter (HOSPITAL_COMMUNITY): Payer: Self-pay | Admitting: Otolaryngology

## 2012-04-24 ENCOUNTER — Ambulatory Visit: Payer: 59

## 2012-04-24 NOTE — Progress Notes (Signed)
Patient discharged to home with family.  Discharged instructions completed including follow up care, medications, signs and symptoms of infection, incisional care and JP drain care.  Emptied and monitored JP ouput without difficulty.  Verbalizes understanding of discharge instructions with no further questions.  Vital signs stable.  Discharged per wheelchair with mother.

## 2012-04-24 NOTE — Discharge Summary (Signed)
Physician Discharge Summary  Patient ID: [name] MRN: [id number] DOB/AGE: December 03, 1980 32 y.o.  Admit date: 04/23/2012 Discharge date: 04/24/2012  Admission Diagnoses: Tongue cancer with adenopathy  Discharge Diagnoses:  Active Problems:   * No active hospital problems. *   Discharged Condition: good  Hospital Course: no complications  Consults: none  Significant Diagnostic Studies: none  Treatments: surgery: left modified neck dissection  Discharge Exam: Blood pressure 116/66, pulse 85, temperature 97.4 F (36.3 C), temperature source Oral, resp. rate 18, height 6\' 2"  (1.88 m), weight 263 lb 3.7 oz (119.4 kg), SpO2 97.00%. PHYSICAL EXAM: Incision looks excellent, no swelling. Facial nerve normal. JP in place with serosangenous drainage.  Disposition: 01-Home or Self Care  Discharge Orders   Future Appointments Provider Department Dept Phone   04/30/2012 9:30 AM Theressa Millard, PT [facility] [phone]   05/05/2012 1:15 PM Myrtis Ser, NP [facility] MEDICAL ONCOLOGY [phone]   05/13/2012 9:30 AM Theressa Millard, PT [facility] [phone]   05/13/2012 10:30 AM [facility] [phone]   05/25/2012 1:00 PM [facility] [phone]   05/25/2012 1:00 PM [facility] [phone]   05/27/2012 9:30 AM Theressa Millard, PT [facility] [phone]   06/03/2012 9:00 AM [facility] [phone]   Future Orders Complete By Expires     Care order/instruction  As directed     Scheduling Instructions:      Please instruct patient on JP emptying, measuring and recharging. Thanks    Diet - low sodium heart healthy  As directed     Increase activity slowly  As directed          Medication List    TAKE these medications       buPROPion 150 MG 12 hr tablet  Commonly known as:  WELLBUTRIN SR  Take 150 mg by mouth 2 (two) times daily.     chlorhexidine 0.12 % solution  Commonly known as:  PERIDEX  Rinse with 15 mls twice daily for 30 seconds. Use after breakfast and at bedtime. Spit out excess. Do not swallow.     HYDROcodone-acetaminophen 7.5-325 mg/15 ml solution  Commonly known as:  HYCET  Take 10-15 mLs by mouth every 4 (four) hours as needed.     ibuprofen 200 MG tablet  Commonly known as:  ADVIL,MOTRIN  Take 400 mg by mouth every 6 (six) hours as needed for pain.     LORazepam 0.5 MG tablet  Commonly known as:  ATIVAN  Take 1 tablet (0.5 mg total) by mouth every 6 (six) hours as needed (anticipatory nausea or vomiting).     ondansetron 8 MG tablet  Commonly known as:  ZOFRAN  Take 1 tablet (8 mg total) by mouth every 12 (twelve) hours as needed. Take two times a day as needed for nausea or vomiting starting on the third day after chemotherapy.     prochlorperazine 10 MG tablet  Commonly known as:  COMPAZINE  Take 1 tablet (10 mg total) by mouth every 6 (six) hours as needed (Nausea or vomiting).     prochlorperazine 25 MG suppository  Commonly known as:  COMPAZINE  Place 1 suppository (25 mg total) rectally every 12 (twelve) hours as needed for nausea.     simvastatin 10 MG tablet  Commonly known as:  ZOCOR  Take  10 mg by mouth every evening.     sodium fluoride 1.1 % Gel dental gel  Commonly known as:  FLUORISHIELD  Instill one drop of gel per tooth space of fluoride tray. Place over teeth for 5 minutes. Remove. Spit out excess. Repeat nightly.           Follow-up Information   Follow up with Serena Colonel, MD On 04/27/2012.   Contact information:   [address], [address] [place], [place] [address] [place] [address] [phone]       Signed: Serena Colonel 04/24/2012, 8:19 AM

## 2012-04-27 ENCOUNTER — Ambulatory Visit: Payer: 59

## 2012-04-28 ENCOUNTER — Ambulatory Visit: Payer: 59

## 2012-04-29 ENCOUNTER — Ambulatory Visit: Payer: 59

## 2012-04-30 ENCOUNTER — Ambulatory Visit: Payer: 59

## 2012-04-30 ENCOUNTER — Ambulatory Visit: Payer: 59 | Admitting: Physical Therapy

## 2012-05-01 ENCOUNTER — Ambulatory Visit: Payer: 59

## 2012-05-01 ENCOUNTER — Telehealth: Payer: Self-pay | Admitting: *Deleted

## 2012-05-01 NOTE — Telephone Encounter (Signed)
Pt left VM asking about his appt scheduled w/ Clenton Pare next week on 4/08.  Asks if the appt needs to be moved since he won't be starting treatment yet?

## 2012-05-02 ENCOUNTER — Other Ambulatory Visit: Payer: Self-pay | Admitting: Oncology

## 2012-05-02 NOTE — Telephone Encounter (Signed)
Please advise him that I will need to delay the visit and start date of chemo.  I sent Dr. Roselind Messier an email asking him when he plans to start radiation.  I'll turn in a POF as soon as Dr. Roselind Messier tells me when.  Thanks.

## 2012-05-04 ENCOUNTER — Other Ambulatory Visit: Payer: Self-pay | Admitting: Oncology

## 2012-05-04 ENCOUNTER — Ambulatory Visit: Payer: 59

## 2012-05-05 ENCOUNTER — Ambulatory Visit: Payer: Self-pay | Admitting: Oncology

## 2012-05-05 ENCOUNTER — Telehealth: Payer: Self-pay | Admitting: Oncology

## 2012-05-05 ENCOUNTER — Ambulatory Visit: Payer: 59

## 2012-05-06 ENCOUNTER — Encounter: Payer: Self-pay | Admitting: Radiation Oncology

## 2012-05-06 ENCOUNTER — Ambulatory Visit: Payer: 59

## 2012-05-06 NOTE — Progress Notes (Signed)
32 year old male. Former smoker.   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 S/P glossectomy left and right.   NKDA  No hx of radiation therapy  No indication of a pacemaker

## 2012-05-07 ENCOUNTER — Ambulatory Visit
Admission: RE | Admit: 2012-05-07 | Discharge: 2012-05-07 | Disposition: A | Discharge status: Home / Self Care | Payer: 59 | Source: Ambulatory Visit | Attending: Radiation Oncology | Admitting: Radiation Oncology

## 2012-05-07 ENCOUNTER — Encounter: Payer: Self-pay | Admitting: Radiation Oncology

## 2012-05-07 ENCOUNTER — Ambulatory Visit: Payer: 59

## 2012-05-07 VITALS — BP 123/84 | HR 92 | Temp 97.8°F | Resp 20 | Ht 74.0 in | Wt 286.6 lb

## 2012-05-07 DIAGNOSIS — C770 Secondary and unspecified malignant neoplasm of lymph nodes of head, face and neck: Secondary | ICD-10-CM | POA: Insufficient documentation

## 2012-05-07 DIAGNOSIS — Z79899 Other long term (current) drug therapy: Secondary | ICD-10-CM | POA: Insufficient documentation

## 2012-05-07 DIAGNOSIS — C021 Malignant neoplasm of border of tongue: Secondary | ICD-10-CM | POA: Insufficient documentation

## 2012-05-07 DIAGNOSIS — C023 Malignant neoplasm of anterior two-thirds of tongue, part unspecified: Secondary | ICD-10-CM

## 2012-05-07 NOTE — Progress Notes (Signed)
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 Earlier this spring the patient underwent simulation for postoperative treatments for his recurrent oral tongue cancer. During the patient's planning CT scan he was noted to have enlarged nodes along the left upper neck. The patient proceeded to undergo PET scan which confirmed new adenopathy in the left neck.  These lymph nodes were approximately 2-3 cm in size on our planning CT scan.  In light of the size of these nodes it was recommended patient proceed with left neck dissection. On March 27 patient was taken to the operating room by Dr. Pollyann Kennedy at which time he underwent a left modified neck dissection (left supra-omohyoid neck dissection)   on pathologic review the patient was found to have metastatic squamous cell carcinoma in 2/34 lymph nodes.   He has done very well since his surgery. He is off pain medication at this time.  He has not used his feeding tube as of yet.                          ALLERGIES:  has No Known Allergies.  Meds: Current Outpatient Prescriptions  Medication Sig Dispense Refill  . buPROPion (WELLBUTRIN SR) 150 MG 12 hr tablet Take 150 mg by mouth 2 (two) times daily.       . chlorhexidine (PERIDEX) 0.12 % solution Rinse with 15 mls twice daily for 30 seconds. Use after breakfast and at bedtime. Spit out excess. Do not swallow.  480 mL  prn  . simvastatin (ZOCOR) 10 MG tablet Take 10 mg by mouth every evening.       Marland Kitchen HYDROcodone-acetaminophen (HYCET) 7.5-325 mg/15 ml solution Take 10-15 mLs by mouth every 4 (four) hours as needed.  480 mL  1  . ibuprofen (ADVIL,MOTRIN) 200 MG tablet Take 400 mg by mouth every 6  (six) hours as needed for pain.      Marland Kitchen LORazepam (ATIVAN) 0.5 MG tablet Take 1 tablet (0.5 mg total) by mouth every 6 (six) hours as needed (anticipatory nausea or vomiting).  30 tablet  1  . ondansetron (ZOFRAN) 8 MG tablet Take 1 tablet (8 mg total) by mouth every 12 (twelve) hours as needed. Take two times a day as needed for nausea or vomiting starting on the third day after chemotherapy.  30 tablet  3  . prochlorperazine (COMPAZINE) 10 MG tablet Take 1 tablet (10 mg total) by mouth every 6 (six) hours as needed (Nausea or vomiting).  30 tablet  1  . prochlorperazine (COMPAZINE) 25 MG suppository Place 1 suppository (25 mg total) rectally every 12 (twelve) hours as needed for nausea.  12 suppository  3  . sodium fluoride (FLUORISHIELD) 1.1 % GEL dental gel Instill one drop of gel per tooth space of fluoride tray. Place over teeth for 5 minutes. Remove. Spit out excess. Repeat nightly.  120 mL  prn   No current facility-administered medications for this encounter.    Physical Findings: The patient is in no acute distress. Patient is alert and oriented.  height is 6\' 2"  (1.88 m) and weight is 286 lb 9.6 oz (130.001 kg). His oral temperature is  97.8 F (36.6 C). His blood pressure is 123/84 and his pulse is 92. His respiration is 20 and oxygen saturation is 99%. .  The left neck shows a well healing scar without signs of drainage or infection. There is no palpable adenopathy in the left or right neck region. The lungs are clear to auscultation. The heart has a regular rhythm and rate.  Lab Findings: Lab Results  Component Value Date   WBC 7.8 04/22/2012   HGB 14.9 04/22/2012   HCT 42.4 04/22/2012   MCV 85.1 04/22/2012   PLT 294 04/22/2012    @LASTCHEM @  Radiographic Findings: Ir Gastrostomy Tube  04/15/2012  *RADIOLOGY REPORT*  Clinical Data/Indication: ORAL CANCER  PERC PLACEMENT GASTROSTOMY  Sedation: Versed 2.0 mg, Fentanyl 100 mcg.  Total Moderate Sedation Time: 30 minutes.  Fluoroscopy  Time:  minutes.  Procedure: The procedure, risks, benefits, and alternatives were explained to the patient. Questions regarding the procedure were encouraged and answered. The patient understands and consents to the procedure.  The epigastrium was prepped with Betadine in a sterile fashion, and a sterile drape was applied covering the operative field. A sterile gown and sterile gloves were used for the procedure.  A 5-French orogastric tube is placed under fluoroscopic guidance. Scout imaging of the abdomen confirms barium within the transverse colon.  The stomach was distended with gas.  Under fluoroscopic guidance, an 18 gauge needle was utilized to puncture the anterior wall of the body of the stomach.   An Amplatz wire was advanced through the needle passing a T fastener into the lumen of the stomach.  The T fastener was secured for gastropexy.  A 9-French sheath was inserted.  A snare was advanced through the 9-French sheath.  A Teena Dunk was advanced through the orogastric tube.  It was snared then pulled out the oral cavity, pulling the snare, as well. The leading edge of the gastrostomy was attached to the snare.  It was then pulled down the esophagus and out the percutaneous site.  It was secured in place.  Contrast was injected.  No complication.  Findings: The image demonstrates placement of a 20-French pull- through type gastrostomy tube into the body of the stomach.  IMPRESSION: Successful 20 French pull-through gastrostomy.   Original Report Authenticated By: Jolaine Click, M.D.    Nm Pet Image Restag (ps) Skull Base To Thigh  04/17/2012  *RADIOLOGY REPORT*  Clinical Data: Subsequent treatment strategy for squamous cell carcinoma of the tongue, status post resection.  NUCLEAR MEDICINE PET SKULL BASE TO THIGH  Fasting Blood Glucose:  104  Technique:  17.5 mCi F-18 FDG was injected intravenously. CT data was obtained and used for attenuation correction and anatomic localization only.  (This was not  acquired as a diagnostic CT examination.) Additional exam technical data entered on technologist worksheet. Dedicated PET imaging of the neck region was also performed.  Any standard uptake value measurements in the neck region are taken from these dedicated neck images.  Comparison:  11/25/2011 and 11/06/2011  Findings:  Neck: A rim of high activity the maxilla is bilaterally symmetric and likely incidental.  Anteriorly along the left side of the tongue, a somewhat linear focus of high activity has a maximum standard uptake value of 11.5, compared to 7.4 in this region on the prior exam.  Just below the left submandibular gland, a station one the lymph node measuring just posterior to the submandibular gland, and station II lymph node measuring 2.1 cm in short axis has a  maximum standard uptake value of 23.7.  This node was previously not hypermetabolic and previously had a short axis diameter of 1.1 cm.  Just above the glottic level, a left station II a lymph node has a short axis diameter of 1.2 cm (previously 0.7 cm) and has new hypermetabolic activity, now with maximum standard uptake value of 13.0.Polypoid mucoperiosteal thickening noted in the maxillary sinuses and right sphenoid sinus.  Right submandibular gland absent.  Chest:  No hypermetabolic activity associated with density along the AP window - this likely represents fluid in a superior pericardial recess.  Abdomen/Pelvis:  Peg tube noted.  No significant abnormal hypermetabolic activity in the abdomen or pelvis. There is free intraperitoneal gas in the upper abdomen, compatible with thegastrostomy tube placement 2 days ago.  Nonobstructive left nephrolithiasis noted with left-sided calculi measuring up to 10 mm.  Skeleton:  No focal hypermetabolic activity to suggest skeletal metastasis.  IMPRESSION:  1.  New adenopathy in the left neck, compatible with recurrent malignancy.  There is some linear increased activity anteriorly along the left side of  the tongue, which could represent local recurrence or may be physiologic. 2.  Free intraperitoneal gas in the upper abdomen, compatible with recent gastrostomy tube placement 2 days ago. 3.  Chronic paranasal sinusitis. 4.  Nonobstructive left nephrolithiasis.   Original Report Authenticated By: Gaylyn Rong, M.D.     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  The patient is doing well since his left neck dissection.   Plan:  Simulation and planning next week with radiation therapy to start approximately 4-5 weeks postop.    _____________________________________  -----------------------------------  Billie Lade, PhD, MD

## 2012-05-07 NOTE — Progress Notes (Addendum)
Follow up Squamous cell Carcioma of tongue,metastasis to cervical lymph node Left radical neck dissection 3/217/14 Alert,oriented x3, no c/o pain, nut tenderness left side neck,numbness on b/l neck,, slight swelling on left side of neck,,  no sob, 99% room air sats, no nausea, dizziness, eating most anything stated opartient, flushes peg tube with 60cc free water daily, site clean and dry,  Has flouroshild not using as yet,  3:13 PM

## 2012-05-08 ENCOUNTER — Ambulatory Visit: Payer: 59

## 2012-05-08 NOTE — Addendum Note (Signed)
Encounter addended by: Agnes Lawrence, RN on: 05/08/2012 10:38 AM<BR>     Documentation filed: Charges VN

## 2012-05-08 NOTE — Addendum Note (Signed)
Encounter addended by: Lowella Petties, RN on: 05/08/2012 12:16 PM<BR>     Documentation filed: Charges VN, Patient Instructions Section

## 2012-05-11 ENCOUNTER — Ambulatory Visit: Payer: 59

## 2012-05-12 ENCOUNTER — Ambulatory Visit: Payer: 59

## 2012-05-12 ENCOUNTER — Ambulatory Visit
Admission: RE | Admit: 2012-05-12 | Discharge: 2012-05-12 | Disposition: A | Discharge status: Home / Self Care | Payer: 59 | Source: Ambulatory Visit | Attending: Radiation Oncology | Admitting: Radiation Oncology

## 2012-05-12 ENCOUNTER — Ambulatory Visit: Payer: 59 | Admitting: Radiation Oncology

## 2012-05-12 VITALS — BP 136/88 | HR 78 | Temp 97.0°F | Resp 16 | Wt 288.6 lb

## 2012-05-12 DIAGNOSIS — C023 Malignant neoplasm of anterior two-thirds of tongue, part unspecified: Secondary | ICD-10-CM

## 2012-05-13 ENCOUNTER — Encounter: Payer: Self-pay | Admitting: Nutrition

## 2012-05-13 ENCOUNTER — Ambulatory Visit: Payer: 59 | Admitting: Physical Therapy

## 2012-05-13 ENCOUNTER — Ambulatory Visit: Payer: Self-pay

## 2012-05-13 ENCOUNTER — Ambulatory Visit: Payer: 59

## 2012-05-13 ENCOUNTER — Encounter (HOSPITAL_COMMUNITY): Payer: Self-pay | Admitting: Dentistry

## 2012-05-13 ENCOUNTER — Ambulatory Visit (HOSPITAL_COMMUNITY): Payer: Self-pay | Admitting: Dentistry

## 2012-05-13 VITALS — BP 126/85 | HR 103 | Temp 98.2°F

## 2012-05-13 DIAGNOSIS — C023 Malignant neoplasm of anterior two-thirds of tongue, part unspecified: Secondary | ICD-10-CM

## 2012-05-13 DIAGNOSIS — C021 Malignant neoplasm of border of tongue: Secondary | ICD-10-CM

## 2012-05-13 DIAGNOSIS — Z463 Encounter for fitting and adjustment of dental prosthetic device: Secondary | ICD-10-CM

## 2012-05-13 DIAGNOSIS — Z0189 Encounter for other specified special examinations: Secondary | ICD-10-CM

## 2012-05-13 NOTE — Progress Notes (Signed)
Patient presented to the clinic today accompanied by his wife for a nurse evaluation and IV start necessary for CT/SIM. Patient alert and oriented to person, place, and time. No distress noted. Steady gait noted. Pleasant affect noted. Patient denies pain at this time. Patient reports that his PEG tube has been placed. Patient reports that his baby boy was born last Friday. Attempted to place a 22 gauge IV right AC but, unsuccessful. Successfully placed left AC 22 gauge IV with excellent blood return.Patient tolerated both well. Staff unable to find bite block therefore, CT/SIM was rescheduled for Thursday. Removed left AC 22 IV. Catheter intact upon removal. Applied an occlusive dressing to the site.

## 2012-05-13 NOTE — Progress Notes (Signed)
05/13/2012  Patient:            [name] Date of Birth:  1980-07-23 MRN:                [id number]  BP 126/85  Pulse 103  Temp(Src) 98.2 F (36.8 C) (Oral)  Past Medical History  Diagnosis Date  . High triglycerides   . Gout   . Staph skin infection     Elbow- resolved.    Marland Kitchen PONV (postoperative nausea and vomiting)   . Depression     "patient quit smoking was put on wellbutrin to assist"  . Head and neck cancer     tongue cancer  . Stones in the urinary tract   . GERD (gastroesophageal reflux disease)     occ   Past Surgical History  Procedure Laterality Date  . Mouth surgery      biopsy- (08/06/2011 Dr. Barbette Merino), also had tissue graft (oral) 2010  . Glossectomy    . Glossectomy  08/22/2011    Procedure: GLOSSECTOMY;  Surgeon: Serena Colonel, MD;  Location: [facility] OR;  Service: ENT;  Laterality: Right;  WITH FROZEN SECTION  . Glossectomy  09/06/2011    Procedure: GLOSSECTOMY;  Surgeon: Serena Colonel, MD;  Location: [facility] OR;  Service: ENT;  Laterality: Right;  Right Tongue Re-Excision  . Radical neck dissection  09/06/2011    Procedure: RADICAL NECK DISSECTION;  Surgeon: Serena Colonel, MD;  Location: [facility] OR;  Service: ENT;  Laterality: Right;  Right Modified Neck Dissection   . Hemiglossectomy N/A 03/23/2012    Procedure: BIOPSY OF TONGUE WITH FROZEN SECTION AND RIGHT HEMIGLOSSECTOMY;  Surgeon: Serena Colonel, MD;  Location: [facility] OR;  Service: ENT;  Laterality: N/A;  . Tracheostomy tube placement N/A 03/23/2012    Procedure: TRACHEOSTOMY;  Surgeon: Serena Colonel, MD;  Location: [facility] OR;  Service: ENT;  Laterality: N/A;  . Tongue biopsy    . Tracheostomy closure      14  . Gastrostomy w/ feeding tube  2/14  . Radical neck dissection Left 04/23/2012    Procedure: LEFT MODIFIED  NECK DISSECTION;  Surgeon: Serena Colonel, MD;  Location: [facility] OR;  Service: ENT;  Laterality: Left;  Left Supra Omo Hyoid Neck Dissection     No Known Allergies Current Outpatient Prescriptions  Medication Sig Dispense Refill  .  buPROPion (WELLBUTRIN SR) 150 MG 12 hr tablet Take 150 mg by mouth 2 (two) times daily.       . chlorhexidine (PERIDEX) 0.12 % solution Rinse with 15 mls twice daily for 30 seconds. Use after breakfast and at bedtime. Spit out excess. Do not swallow.  480 mL  prn  . HYDROcodone-acetaminophen (HYCET) 7.5-325 mg/15 ml solution Take 10-15 mLs by mouth every 4 (four) hours as needed.  480 mL  1  . ibuprofen (ADVIL,MOTRIN) 200 MG tablet Take 400 mg by mouth every 6 (six) hours as needed for pain.      Marland Kitchen LORazepam (ATIVAN) 0.5 MG tablet Take 1 tablet (0.5 mg total) by mouth every 6 (six) hours as needed (anticipatory nausea or vomiting).  30 tablet  1  . ondansetron (ZOFRAN) 8 MG tablet Take 1 tablet (8 mg total) by mouth every 12 (twelve) hours as needed. Take two times a day as needed for nausea or vomiting starting on the third day after chemotherapy.  30 tablet  3  . prochlorperazine (COMPAZINE) 10 MG tablet Take 1 tablet (10 mg total) by mouth every 6 (six) hours as needed (  Nausea or vomiting).  30 tablet  1  . prochlorperazine (COMPAZINE) 25 MG suppository Place 1 suppository (25 mg total) rectally every 12 (twelve) hours as needed for nausea.  12 suppository  3  . simvastatin (ZOCOR) 10 MG tablet Take 10 mg by mouth every evening.       . sodium fluoride (FLUORISHIELD) 1.1 % GEL dental gel Instill one drop of gel per tooth space of fluoride tray. Place over teeth for 5 minutes. Remove. Spit out excess. Repeat nightly.  120 mL  prn   No current facility-administered medications for this visit.    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  Patient underwent right hemiglossectomy with Dr. Pollyann Kennedy 03/23/2012 along with right modified neck dissection.  Patient subsequently found to have cancer involving the left neck. Patient underwent left neck dissection with Dr. Pollyann Kennedy on 04/23/2012. Patient with anticipated postoperative radiation therapy  with Dr. Roselind Messier. Patient now presents for fabrication of a new radiation cone locator/tongue positioner.   PROCEDURE: Initial fabrication of the radiation cone locator was started in the dental lab. Approximately one hour of lab time was utilized to perform the initial fabrication of the radiation cone locator. Radiation cone locator and tongue positioner fabrication was then completed chair side. This took approximately 2 hours to complete the fabrication of the radiation cone locator. The appliances and polished and adjusted appropriately. Patient was able to place the radiation cone locator in and out without problems. Appliance was repolished and disinfected as needed. Instructions were provided on the use and care of the radiation cone locator.  Patient is to return to clinic for periodic oral examination in approximately 2-3 weeks during radiation therapy. Patient to call if questions or problems arise before then.  Patient is to brush and floss after meals and at bedtime. Patient is to use his fluoride at bedtime-starting now. Patient reminded to use trismus exercises 2-3 times a day along with continued physical therapy as indicated. Return to clinic as scheduled.   Charlynne Pander, DDS

## 2012-05-13 NOTE — Patient Instructions (Signed)
RADIATION THERAPY AND DECISIONS REGARDING YOUR TEETH  Xerostomia (dry mouth) Your salivary glands may be in the filed of radiation.  Radiation may include all or part of your saliva glands.  This will cause your saliva to dry up and you will have a dry mouth.  The dry mouth will be for the rest of your life unless your radiation oncologist tells you otherwise.  Your saliva has many functions:  Saliva wets your tongue for speaking.  It coats your teeth and the inside of your mouth for easier movement.  It helps with chewing and swallowing food.  It helps clean away harmful acid and toxic products made by the germs in your mouth, therefore it helps prevent cavities.  It kills some germs in your mouth and helps to prevent gum disease.  It helps to carry flavor to your taste buds.  Once you have lost your saliva you will be at higher risk for tooth decay and gum disease.  What can be done to help improve your mouth when there's not enough saliva:  1.  Your dentist may give a prescription for Salagen.  It will not bring back all of your saliva but may bring back some of it.  Also your saliva may be thick and ropy or white and foamy. It will not feel like it use to feel.  2.  You will need to swish with water every time your mouth feels dry.  YOU CANNOT suck on any cough drops, mints, lemon drops, candy, vitamin C or any other products.  You cannot use anything other than water to make your mouth feel less dry.  If you want to drink anything else you have to drink it all at once and brush afterwards.  Be sure to discuss the details of your diet habits with your dentist or hygienist.  Radiation caries: This is decay that happens very quickly once your mouth is very dry due to radiation therapy.  Normally cavities take six months to two years to become a problem.  When you have dry mouth cavities may take as little as eight weeks to cause you a problem.  This is why dental check ups every two  months are necessary as long as you have a dry mouth. Radiation caries typically, but not always, start at your gum line where it is hard to see the cavity.  It is therefore also hard to fill these cavities adequately.  This high rate of cavities happens because your mouth no longer has saliva and therefore the acid made by the germs starts the decay process.  Whenever you eat anything the germs in your mouth change the food into acid.  The acid then burns a small hole in your tooth.  This small hole is the beginning of a cavity.  If this is not treated then it will grow bigger and become a cavity.  The way to avoid this hole getting bigger is to use fluoride every evening as prescribed by your dentist.  You have to make sure that your teeth are very clean before you use the fluoride.  This fluoride in turn will strengthen your teeth and prepare them for another day of fighting acid.  If you develop radiation caries many times the damage is so large that you will have to have all your teeth removed.  This could be a big problem if some of these teeth are in the field of radiation.  Further details of why this could be   a big problem will follow.  (See Osteoradionecrosis).  Loss of taste (dysgeusia) This happens to varying degrees once you've had radiation therapy to your jaw region.  Many times taste is not completely lost but becomes limited.  The loss of taste is mostly due to radiation affecting your taste buds.  However if you have no saliva in your mouth to carry the flavor to your taste buds it would be difficult for your taste buds to taste anything.  That is why using water or a prescription for Salagen prior to meals and during meals may help with some of the taste.  Keep in mind that taste generally returns very slowly over the course of several months or several years after radiation therapy.  Don't give up hope.  Trismus According to your Radiation Oncologist your TMJ or jaw joints are going to be  partially or fully in the field of radiation.  This means that over time the muscles that help you open and close your mouth may get stiff.  This will potentially result in your not being able to open your mouth wide enough or as wide as you can open it now.  Le me give you an example of how slowly this happens and how unaware people are of it.  A gentlemen that had radiation therapy two years ago came back to me complaining that bananas are just too large for him to be able to fit them in between his teeth.  He was not able to open wide enough to bite into a banana.  This happens slowly and over a period of time.  What do we do to try and prevent this?  Your dentist will probably give you a stack of sticks called a trismus exercise device .  This stack will help your remind your muscles and your jaw joint to open up to the same distance every day.  Use these sticks every morning when you wake up according to the instructions given by the dentist.   You must use these sticks for at least one to two years after radiation therapy.  The reason for that is because it happens so slowly and keeps going on for about two years after radiation therapy.  Your hospital dentist will help you monitor your mouth opening and make sure that it's not getting smaller.  Osteoradionecrosis (ORN) This is a condition where your jaw bone after having had radiation therapy becomes very dry.  It has very little blood supply to keep it alive.  If you develop a cavity that turns into an abscess or an infection then the jaw bone does not have enough blood supply to help fight the infection.  At this point it is very likely that the infection could cause the death of your jaw bone.  When you have dead bone it has to be removed.  Therefore you might end up having to have surgery to remove part of your jaw bone, the part of the jaw bone that has been affected.   Healing is also a problem if you are to have surgery in the areas where the bone  has had radiation therapy.  The same reasons apply.  If you have surgery you need more blood supply which is not available.  When blood supply and oxygen are not available again, there is a chance for the bone to die.  Occasionally ORN happens on its own with no obvious reason.  This is quite rare.  We believe that   patients who continue to smoke and/or drink alcohol have a higher chance of having this bone problem.  Therefore once your jaw bone has had radiation therapy if there are any teeth in that area, you should never have them pulled.  You should also never have any surgery on your teeth or gums in that area unless the oral surgeon or Periodontist is aware of your history of radiation. There is some expensive management techniques that might be used to limit your risks.  The risks for ORN either from infection or spontaneous ( or on it's own) are life long.    TRISMUS  Trismus is a condition where the jaw does not allow the mouth to open as wide as it usually does.  This can happen almost suddenly, or in other cases the process is so slow, it is hard to notice it-until it is too far along.  When the jaw joints and/or muscles have been exposed to radiation treatments, the onset of Trismus is very slow.  This is because the muscles are losing their stretching ability over a long period of time, as long as 2 YEARS after the end of radiation.  It is therefore important to exercise these muscles and joints.  TRISMUS EXERCISES   Stack of tongue depressors measuring the same or a little less than the last documented MIO (Maximum Interincisal Opening).  Secure them with a rubber band on both ends.  Place the stack in the patient's mouth, supporting the other end.  Allow 30 seconds for muscle stretching.  Rest for a few seconds.  Repeat 3-5 times  For all radiation patients, this exercise is recommended in the mornings and evenings unless otherwise instructed.  The exercise should be done for  a period of 2 YEARS after the end of radiation.  MIO should be checked routinely on recall dental visits by the general dentist or the hospital dentist.  The patient is advised to report any changes, soreness, or difficulties encountered when doing the exercises.  FLUORIDE TRAYS PATIENT INSTRUCTIONS    Obtain prescription from the pharmacy.  Don't be surprised if it needs to be ordered.   Be sure to let the pharmacy know when you are close to needing a new refill for them to have it ready for you without interruption of Fluoride use.   The best time to use your Fluoride is before bed time.   You must brush your teeth very well and floss before using the Fluoride in order to get the best use out of the Fluoride treatments.   Place 1 drop of Fluoride gel per tooth in the tray.   Place the tray on your lower teeth and/or your upper teeth.  Make sure the trays are seated all the way.  Remember, they only fit one way on your teeth.   Insert for 5 full minutes.   At the end of the 5 minutes, take the trays out.  SPIT OUT excess. .    Do NOT rinse your mouth!    Do NOT eat or drink after treatments for at least 30 minutes.  This is why the best time for your treatments is before bedtime.    Clean the inside of your Fluoride trays using COLD WATER and a toothbrush.    In order to keep your Trays from discoloring and free from odors, soak them overnight in denture cleaners such as Efferdent.  Do not use bleach or non denture products.    Store the trays   in a safe dry place AWAY from any heat until your next treatment.    Bring the trays with you for your next dental check-up.  The dentist will confirm their fit.    If anything happens to your Fluoride trays, or they don't fit as well after any dental work, please let us know as soon as possible. 

## 2012-05-14 ENCOUNTER — Ambulatory Visit
Admission: RE | Admit: 2012-05-14 | Discharge: 2012-05-14 | Disposition: A | Discharge status: Home / Self Care | Payer: 59 | Source: Ambulatory Visit | Attending: Radiation Oncology | Admitting: Radiation Oncology

## 2012-05-14 ENCOUNTER — Ambulatory Visit: Payer: 59

## 2012-05-14 VITALS — BP 146/81 | HR 87 | Temp 98.0°F | Wt 292.0 lb

## 2012-05-14 DIAGNOSIS — C023 Malignant neoplasm of anterior two-thirds of tongue, part unspecified: Secondary | ICD-10-CM

## 2012-05-14 MED ORDER — SODIUM CHLORIDE 0.9 % IJ SOLN
10.0000 mL | INTRAMUSCULAR | Status: DC | PRN
  Administered 2012-05-14: 10 mL via INTRAVENOUS

## 2012-05-14 NOTE — Progress Notes (Signed)
Patient presented to clinic for IV start.  He is alert and oriented to person, place and time.  IV placement was attempted twice.  A 22 gauge IV was placed by Myriam Jacobson, RN in his right forearm.  Patient tolerated well.  He was escorted to [facility].

## 2012-05-15 ENCOUNTER — Ambulatory Visit: Payer: 59

## 2012-05-15 ENCOUNTER — Telehealth: Payer: Self-pay | Admitting: *Deleted

## 2012-05-15 ENCOUNTER — Other Ambulatory Visit: Payer: Self-pay | Admitting: Oncology

## 2012-05-15 DIAGNOSIS — C023 Malignant neoplasm of anterior two-thirds of tongue, part unspecified: Secondary | ICD-10-CM

## 2012-05-15 NOTE — Telephone Encounter (Signed)
Pt called to ask why his radiation and chemo is delayed until May?  He says his family is asking.  Informed pt he needs time to heal after surgery before starting the radiation and chemo.   Informed we also need to schedule office visit w/ Dr. Gaylyn Rong prior to starting chemo and to expect a call from scheduling about adding on office visit and possibly labs.  He verbalized understanding.

## 2012-05-15 NOTE — Telephone Encounter (Signed)
Per staff message and POF I have scheduled appts.  JMW  

## 2012-05-18 ENCOUNTER — Telehealth: Payer: Self-pay | Admitting: Oncology

## 2012-05-18 ENCOUNTER — Ambulatory Visit: Payer: 59

## 2012-05-18 ENCOUNTER — Telehealth: Payer: Self-pay | Admitting: *Deleted

## 2012-05-18 NOTE — Telephone Encounter (Signed)
Per scheduler I have adjusted appt.  JMW

## 2012-05-18 NOTE — Telephone Encounter (Signed)
s.w. pt and advised on 5.5.14 appt...pt ok and aware

## 2012-05-18 NOTE — Addendum Note (Signed)
Encounter addended by: Delynn Flavin, RN on: 05/18/2012  6:25 PM<BR>     Documentation filed: Charges VN

## 2012-05-19 ENCOUNTER — Ambulatory Visit: Payer: 59

## 2012-05-20 ENCOUNTER — Ambulatory Visit: Payer: 59

## 2012-05-21 ENCOUNTER — Ambulatory Visit: Payer: 59

## 2012-05-21 NOTE — Progress Notes (Signed)
  Radiation Oncology         [phone]) [phone] ________________________________  Name: [name] MRN: [id number]  Date: 05/14/2012  DOB: Mar 16, 1980  SIMULATION AND TREATMENT PLANNING NOTE  DIAGNOSIS:  Recurrent oral tongue cancer  NARRATIVE:  The patient was brought to the CT Simulation planning suite.  Identity was confirmed.  All relevant records and images related to the planned course of therapy were reviewed.  The patient freely provided informed written consent to proceed with treatment after reviewing the details related to the planned course of therapy. The consent form was witnessed and verified by the simulation staff.  Then, the patient was set-up in a stable reproducible  supine position for radiation therapy.  CT images were obtained.  Surface markings were placed.  The CT images were loaded into the planning software.  Then the target and avoidance structures were contoured.  Treatment planning then occurred.  The radiation prescription was entered and confirmed.  Then, I designed and supervised the construction of a total of 1 medically necessary complex treatment devices.  I have requested : Intensity Modulated Radiotherapy (IMRT) is medically necessary for this case for the following reason:  Parotid sparing..  I have ordered:nutrition consult, dose calc.  PLAN:  The patient will receive 60 Gy in 30 fractions.  ________________________________   Special treatment procedure note   The patient will be receiving radiosensitizing chemotherapy. Given the increased potential for toxicities as well as the necessity for close monitoring of the patient and bloodwork, this constitutes a special treatment procedure.  -----------------------------------  Billie Lade, PhD, MD

## 2012-05-22 ENCOUNTER — Ambulatory Visit: Payer: 59

## 2012-05-25 ENCOUNTER — Ambulatory Visit: Payer: Self-pay

## 2012-05-25 ENCOUNTER — Ambulatory Visit: Payer: 59

## 2012-05-26 ENCOUNTER — Ambulatory Visit: Payer: 59

## 2012-05-27 ENCOUNTER — Ambulatory Visit: Payer: 59 | Admitting: Physical Therapy

## 2012-05-27 ENCOUNTER — Ambulatory Visit: Payer: 59

## 2012-05-28 ENCOUNTER — Ambulatory Visit: Payer: 59

## 2012-05-29 ENCOUNTER — Ambulatory Visit: Payer: 59

## 2012-06-01 ENCOUNTER — Ambulatory Visit (HOSPITAL_BASED_OUTPATIENT_CLINIC_OR_DEPARTMENT_OTHER): Payer: 59

## 2012-06-01 ENCOUNTER — Ambulatory Visit (HOSPITAL_BASED_OUTPATIENT_CLINIC_OR_DEPARTMENT_OTHER): Payer: 59 | Admitting: Oncology

## 2012-06-01 ENCOUNTER — Other Ambulatory Visit (HOSPITAL_BASED_OUTPATIENT_CLINIC_OR_DEPARTMENT_OTHER): Payer: 59

## 2012-06-01 ENCOUNTER — Telehealth: Payer: Self-pay | Admitting: Oncology

## 2012-06-01 ENCOUNTER — Ambulatory Visit: Payer: 59

## 2012-06-01 VITALS — BP 139/94 | HR 109 | Temp 97.5°F | Resp 20 | Ht 74.0 in | Wt 294.2 lb

## 2012-06-01 DIAGNOSIS — C029 Malignant neoplasm of tongue, unspecified: Secondary | ICD-10-CM

## 2012-06-01 DIAGNOSIS — C023 Malignant neoplasm of anterior two-thirds of tongue, part unspecified: Secondary | ICD-10-CM

## 2012-06-01 DIAGNOSIS — Z5111 Encounter for antineoplastic chemotherapy: Secondary | ICD-10-CM

## 2012-06-01 LAB — COMPREHENSIVE METABOLIC PANEL (CC13)
Albumin: 3.9 g/dL (ref 3.5–5.0)
CO2: 25 mEq/L (ref 22–29)
Glucose: 95 mg/dl (ref 70–99)
Potassium: 3.6 mEq/L (ref 3.5–5.1)
Sodium: 135 mEq/L — ABNORMAL LOW (ref 136–145)
Total Bilirubin: 0.82 mg/dL (ref 0.20–1.20)
Total Protein: 7.8 g/dL (ref 6.4–8.3)

## 2012-06-01 LAB — CBC WITH DIFFERENTIAL/PLATELET
Basophils Absolute: 0.1 10*3/uL (ref 0.0–0.1)
Eosinophils Absolute: 0.2 10*3/uL (ref 0.0–0.5)
HCT: 41.3 % (ref 38.4–49.9)
HGB: 14.3 g/dL (ref 13.0–17.1)
MCV: 84.5 fL (ref 79.3–98.0)
MONO%: 10.1 % (ref 0.0–14.0)
NEUT#: 8.5 10*3/uL — ABNORMAL HIGH (ref 1.5–6.5)
NEUT%: 75.3 % — ABNORMAL HIGH (ref 39.0–75.0)
RDW: 12.3 % (ref 11.0–14.6)

## 2012-06-01 MED ORDER — SODIUM CHLORIDE 0.9 % IV SOLN
INTRAVENOUS | Status: DC
  Administered 2012-06-01: 11:00:00 via INTRAVENOUS

## 2012-06-01 MED ORDER — SODIUM CHLORIDE 0.9 % IV SOLN
150.0000 mg | Freq: Once | INTRAVENOUS | Status: AC
  Administered 2012-06-01: 150 mg via INTRAVENOUS
  Filled 2012-06-01: qty 5

## 2012-06-01 MED ORDER — DEXAMETHASONE SODIUM PHOSPHATE 20 MG/5ML IJ SOLN
12.0000 mg | Freq: Once | INTRAMUSCULAR | Status: AC
  Administered 2012-06-01: 12 mg via INTRAVENOUS

## 2012-06-01 MED ORDER — PALONOSETRON HCL INJECTION 0.25 MG/5ML
0.2500 mg | Freq: Once | INTRAVENOUS | Status: AC
  Administered 2012-06-01: 0.25 mg via INTRAVENOUS

## 2012-06-01 MED ORDER — POTASSIUM CHLORIDE 2 MEQ/ML IV SOLN
Freq: Once | INTRAVENOUS | Status: AC
  Administered 2012-06-01: 12:00:00 via INTRAVENOUS
  Filled 2012-06-01: qty 10

## 2012-06-01 MED ORDER — CISPLATIN CHEMO INJECTION 100MG/100ML
100.0000 mg/m2 | Freq: Once | INTRAVENOUS | Status: AC
  Administered 2012-06-01: 257 mg via INTRAVENOUS
  Filled 2012-06-01: qty 257

## 2012-06-01 NOTE — Progress Notes (Signed)
Has a two month old son that awakens twice during the night so patient is not sleeping well.

## 2012-06-01 NOTE — Progress Notes (Signed)
Discharged at 4:30 pm, ambulatory with wife to home in no distress.

## 2012-06-01 NOTE — Progress Notes (Signed)
[facility]  Telephone:([phone]) [phone] [phone]   OFFICE PROGRESS NOTE   Cc:  [name], MD  DIAGNOSIS: recurrent tongue squamous cell carcinoma.   PAST THERAPY:  partial right hemiglossectomy on 08/22/2011 with pathology case number [id number] consistent with invasive squamous cell carcinoma measured 1.3 cm. Margin was not involved. He underwent right neck dissection on 09/06/2011 with pathology case number [id number] showing 1 the 24 lymph nodes in levels one 2 and 3 containing squamous cell carcinoma. He also has reresection of the right tongue which showed ulcerated squamous mucosa with underlying acute and chronic inflammation, granulation tissue formation and giant cell reaction without tumor seen. He was on observation until earlier  2014 he noticed and also in the right side of the tongue. On 03/23/2012, he underwent resection of the right tongue with pathology case number [id number] showed invasive well-differentiated squamous cell carcinoma, spanning 1.5 cm in greatest dimension. Margins were negative. There was no lymphovascular invasion. The tumor did invade into the underlying muscle. He underwent left neck dissection on 04/23/2012 with 2/34 positive nodes.   CURRENT THERAPY:  Due to start adjuvant chemoradiation with q3 weeks cisplatin and daily radiation today 06/01/2012.   INTERVAL HISTORY: [name] 32 y.o. male returns for regular follow up with his wife.  He reports feeling well.  He has some numbness in his neck due to neck dissection.  However, they have healed nicely without erythema, purulent discharge, pain.  He has good appetite and no weight loss.  He has good stamina and is able to perform chores around the house.  The rest of the 14-point review of system was negative.   Past Medical History  Diagnosis Date  . High triglycerides   . Gout   . Staph skin infection     Elbow- resolved.    Marland Kitchen PONV (postoperative nausea and vomiting)     . Depression     "patient quit smoking was put on wellbutrin to assist"  . Head and neck cancer     tongue cancer  . Stones in the urinary tract   . GERD (gastroesophageal reflux disease)     occ    Past Surgical History  Procedure Laterality Date  . Mouth surgery      biopsy- (08/06/2011 Dr. Barbette Merino), also had tissue graft (oral) 2010  . Glossectomy    . Glossectomy  08/22/2011    Procedure: GLOSSECTOMY;  Surgeon: Serena Colonel, MD;  Location: [facility] OR;  Service: ENT;  Laterality: Right;  WITH FROZEN SECTION  . Glossectomy  09/06/2011    Procedure: GLOSSECTOMY;  Surgeon: Serena Colonel, MD;  Location: [facility] OR;  Service: ENT;  Laterality: Right;  Right Tongue Re-Excision  . Radical neck dissection  09/06/2011    Procedure: RADICAL NECK DISSECTION;  Surgeon: Serena Colonel, MD;  Location: [facility] OR;  Service: ENT;  Laterality: Right;  Right Modified Neck Dissection   . Hemiglossectomy N/A 03/23/2012    Procedure: BIOPSY OF TONGUE WITH FROZEN SECTION AND RIGHT HEMIGLOSSECTOMY;  Surgeon: Serena Colonel, MD;  Location: [facility] OR;  Service: ENT;  Laterality: N/A;  . Tracheostomy tube placement N/A 03/23/2012    Procedure: TRACHEOSTOMY;  Surgeon: Serena Colonel, MD;  Location: [facility] OR;  Service: ENT;  Laterality: N/A;  . Tongue biopsy    . Tracheostomy closure      14  . Gastrostomy w/ feeding tube  2/14  . Radical neck dissection Left 04/23/2012    Procedure: LEFT MODIFIED  NECK DISSECTION;  Surgeon: Serena Colonel, MD;  Location: [facility] OR;  Service: ENT;  Laterality: Left;  Left Supra Omo Hyoid Neck Dissection     Current Outpatient Prescriptions  Medication Sig Dispense Refill  . buPROPion (WELLBUTRIN SR) 150 MG 12 hr tablet Take 150 mg by mouth 2 (two) times daily.       . chlorhexidine (PERIDEX) 0.12 % solution Rinse with 15 mls twice daily for 30 seconds. Use after breakfast and at bedtime. Spit out excess. Do not swallow.  480 mL  prn  . HYDROcodone-acetaminophen (HYCET) 7.5-325 mg/15 ml solution Take 10-15 mLs by mouth  every 4 (four) hours as needed.  480 mL  1  . ibuprofen (ADVIL,MOTRIN) 200 MG tablet Take 400 mg by mouth every 6 (six) hours as needed for pain.      Marland Kitchen LORazepam (ATIVAN) 0.5 MG tablet Take 1 tablet (0.5 mg total) by mouth every 6 (six) hours as needed (anticipatory nausea or vomiting).  30 tablet  1  . ondansetron (ZOFRAN) 8 MG tablet Take 1 tablet (8 mg total) by mouth every 12 (twelve) hours as needed. Take two times a day as needed for nausea or vomiting starting on the third day after chemotherapy.  30 tablet  3  . prochlorperazine (COMPAZINE) 10 MG tablet Take 1 tablet (10 mg total) by mouth every 6 (six) hours as needed (Nausea or vomiting).  30 tablet  1  . prochlorperazine (COMPAZINE) 25 MG suppository Place 1 suppository (25 mg total) rectally every 12 (twelve) hours as needed for nausea.  12 suppository  3  . simvastatin (ZOCOR) 10 MG tablet Take 10 mg by mouth every evening.       . sodium fluoride (FLUORISHIELD) 1.1 % GEL dental gel Instill one drop of gel per tooth space of fluoride tray. Place over teeth for 5 minutes. Remove. Spit out excess. Repeat nightly.  120 mL  prn   No current facility-administered medications for this visit.    ALLERGIES:  has No Known Allergies.  REVIEW OF SYSTEMS:  The rest of the 14-point review of system was negative.   Filed Vitals:   06/01/12 0918  BP: 139/94  Pulse: 109  Temp: 97.5 F (36.4 C)  Resp: 20   Wt Readings from Last 3 Encounters:  06/01/12 294 lb 3.2 oz (133.448 kg)  05/14/12 292 lb (132.45 kg)  05/12/12 288 lb 9.6 oz (130.908 kg)   ECOG Performance status: 0  PHYSICAL EXAMINATION:    General: well-nourished man, in no acute distress. Eyes: no scleral icterus. ENT: The right side of his tongue was resected without any obvious ulceration, bleeding, purulent discharge. Neck was without thyromegaly. Lymphatics: Negative for supraclavicular or axillary adenopathy. His bilateral neck dissection scars have healed up nicely. I did  not feel any cervical adenopathy.  Respiratory: lungs were clear bilaterally without wheezing or crackles. Cardiovascular: Regular rate and rhythm, S1/S2, without murmur, rub or gallop. There was no pedal edema. GI: abdomen was soft, flat, nontender, nondistended, without organomegaly. PEG tube in place, dry, clean, intact without pain. Muscoloskeletal: no spinal tenderness of palpation of vertebral spine. Skin exam was without echymosis, petichae. Neuro exam was nonfocal. Patient was able to get on and off exam table without assistance. Gait was normal. Patient was alert and oriented. Attention was good. Language was appropriate. Mood was normal without depression. Speech was not pressured. Thought content was not tangential.       LABORATORY/RADIOLOGY DATA:  Lab Results  Component Value Date   WBC  11.3* 06/01/2012   HGB 14.3 06/01/2012   HCT 41.3 06/01/2012   PLT 235 06/01/2012   GLUCOSE 90 04/22/2012   ALKPHOS 61 09/28/2008   ALT 29 09/28/2008   AST 24 09/28/2008   NA 142 04/22/2012   K 4.0 04/22/2012   CL 104 04/22/2012   CREATININE 0.80 04/22/2012   BUN 11 04/22/2012   CO2 25 04/22/2012   INR 0.97 04/15/2012     ASSESSMENT AND PLAN:   1.  Recurrent head/neck cancer. - Without adjuvant therapy, chance of recurrent disease is about 30-40%.  - I strongly recommended adjuvant chemo cisplatin along with daily radiation. - I discussed with patient side effects of cisplatin which include but not limited to nausea vomiting, alopecia, fatigue, mucositis, ototoxicity, nephrotoxocity, abnormal electrolytes, cytopenia, risk of bleeding and infection.  - He and his wife expressed informed understanding and wished to proceed.  2.  Nausea/vomiting prophy:  He has Compazine/Zofran/Ativan prn.  3.  Mucositis prevention:  I recommended salt/baking soda mouth rinse after meals.  I encouraged him to perform swallowing exercise to prevent esophageal stricture.   4.  Follow up:  In about 10 days for mid cycle  check.   The length of time of the face-to-face encounter was 25  minutes. More than 50% of time was spent counseling and coordination of care.

## 2012-06-01 NOTE — Patient Instructions (Addendum)
[facility] Discharge Instructions for Patients Receiving Chemotherapy  Today you received the following chemotherapy agents Cisplatin  To help prevent nausea and vomiting after your treatment, we encourage you to take your nausea medication lorazepam, prochlorperazine, ondansetron. Begin taking it at anytime upon d/c and take it as often as prescribed for the next 72 hours and as needed.   If you develop nausea and vomiting that is not controlled by your nausea medication, call the clinic. If it is after clinic hours your family physician or the after hours number for the clinic or go to the Emergency Department.   BELOW ARE SYMPTOMS THAT SHOULD BE REPORTED IMMEDIATELY:  *FEVER GREATER THAN 100.5 F  *CHILLS WITH OR WITHOUT FEVER  NAUSEA AND VOMITING THAT IS NOT CONTROLLED WITH YOUR NAUSEA MEDICATION  *UNUSUAL SHORTNESS OF BREATH  *UNUSUAL BRUISING OR BLEEDING  TENDERNESS IN MOUTH AND THROAT WITH OR WITHOUT PRESENCE OF ULCERS  *URINARY PROBLEMS  *BOWEL PROBLEMS  UNUSUAL RASH Items with * indicate a potential emergency and should be followed up as soon as possible.  One of the nurses will contact you 24 hours after your treatment. Please let the nurse know about any problems that you may have experienced. Feel free to call the clinic you have any questions or concerns. The clinic phone number is [phone].   I have been informed and understand all the instructions given to me. I know to contact the clinic, my physician, or go to the Emergency Department if any problems should occur. I do not have any questions at this time, but understand that I may call the clinic during office hours   should I have any questions or need assistance in obtaining follow up care.    __________________________________________  _____________  __________ Signature of Patient or Authorized Representative            Date                    Time    __________________________________________ Nurse's Signature

## 2012-06-02 ENCOUNTER — Ambulatory Visit
Admission: RE | Admit: 2012-06-02 | Discharge: 2012-06-02 | Disposition: A | Discharge status: Home / Self Care | Payer: 59 | Source: Ambulatory Visit | Attending: Radiation Oncology | Admitting: Radiation Oncology

## 2012-06-02 ENCOUNTER — Encounter: Payer: Self-pay | Admitting: Radiation Oncology

## 2012-06-02 ENCOUNTER — Telehealth: Payer: Self-pay | Admitting: *Deleted

## 2012-06-02 ENCOUNTER — Ambulatory Visit: Payer: 59

## 2012-06-02 VITALS — BP 130/86 | HR 90 | Temp 98.0°F | Resp 20 | Wt 294.7 lb

## 2012-06-02 DIAGNOSIS — C029 Malignant neoplasm of tongue, unspecified: Secondary | ICD-10-CM

## 2012-06-02 DIAGNOSIS — C023 Malignant neoplasm of anterior two-thirds of tongue, part unspecified: Secondary | ICD-10-CM

## 2012-06-02 MED ORDER — CHLORPROMAZINE HCL 25 MG PO TABS: 25.0000 mg | ORAL_TABLET | Freq: Three times a day (TID) | ORAL | Status: DC

## 2012-06-02 MED ORDER — RADIAPLEXRX EX GEL
Freq: Once | CUTANEOUS | Status: AC
  Administered 2012-06-02: 15:00:00 via TOPICAL

## 2012-06-02 NOTE — Telephone Encounter (Signed)
Message copied by Kathlynn Grate on Tue Jun 02, 2012  5:15 PM ------      Message from: Augusto Garbe      Created: Mon Jun 01, 2012  2:54 PM      Regarding: Chemotherapy Call Back       Dr. Gaylyn Rong   1st Cisplatin  RT @ 1:20 PM ------

## 2012-06-02 NOTE — Progress Notes (Signed)
Post sim ed completed w/pt. Gave pt "Radiaiton and You " booklet w/all pertinent information marked and discussed, re: fatigue, hair loss, mouth irritation/care, skin irritation/care, nutrition, pain, throat irritation/care. Gave pt Radiaplex w/instructions for proper use. Pt verbalized understanding. No questions verbalized.

## 2012-06-02 NOTE — Progress Notes (Signed)
[facility]    Radiation Oncology [address] [place]     Maryln Gottron, M.D. [place], [place] [address]               Billie Lade, M.D., Ph.D. Phone: [phone]      Molli Hazard A. Kathrynn Running, M.D. Fax: [phone]      Radene Gunning, M.D., Ph.D.         Lurline Hare, M.D.         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  Current Dose: 2 Gy  Current Fraction: 1  Planned Dose: 60 Gy  Narrative: [name] was seen today for weekly treatment management. The chart was checked and MVCT  were reviewed.  He is tolerating the radiation therapy well at this time without any side effects. This morning however the patient developed  intractable hiccups.  The patient did receive his first cycle of chemotherapy yesterday.  I spoke with Dr. Gaylyn Rong who has recommended Thorazine for this issue.  a prescription has been forwarded to the [facility] pharmacy.  Review of patient's allergies indicates no known allergies.  Current Outpatient Prescriptions  Medication Sig Dispense Refill  . buPROPion (WELLBUTRIN SR) 150 MG 12 hr tablet Take 150 mg by mouth 2 (two) times daily.       . chlorhexidine (PERIDEX) 0.12 % solution Rinse with 15 mls twice daily for 30 seconds. Use after breakfast and at bedtime. Spit out excess. Do not swallow.  480 mL  prn  . HYDROcodone-acetaminophen (HYCET) 7.5-325 mg/15 ml solution Take 10-15 mLs by mouth every 4 (four) hours as needed.  480 mL  1  . ibuprofen (ADVIL,MOTRIN) 200 MG tablet Take 400 mg by mouth every 6 (six) hours as needed for pain.      Marland Kitchen LORazepam (ATIVAN) 0.5 MG tablet Take 1 tablet (0.5 mg total) by mouth every 6 (six) hours as needed (anticipatory  nausea or vomiting).  30 tablet  1  . ondansetron (ZOFRAN) 8 MG tablet Take 1 tablet (8 mg total) by mouth every 12 (twelve) hours as needed. Take two times a day as needed for nausea or vomiting starting on the third day after chemotherapy.  30 tablet  3  . prochlorperazine (COMPAZINE) 10 MG tablet Take 1 tablet (10 mg total) by mouth every 6 (six) hours as needed (Nausea or vomiting).  30 tablet  1  . prochlorperazine (COMPAZINE) 25 MG suppository Place 1 suppository (25 mg total) rectally every 12 (twelve) hours as needed for nausea.  12 suppository  3  . simvastatin (ZOCOR) 10 MG tablet Take 10 mg by mouth every evening.       . sodium fluoride (FLUORISHIELD) 1.1 % GEL dental gel Instill one drop of gel per tooth space of fluoride tray. Place over teeth for 5 minutes. Remove. Spit out excess. Repeat nightly.  120 mL  prn  . chlorproMAZINE (THORAZINE) 25 MG tablet Take 1 tablet (25 mg total) by mouth 3 (three) times daily.  25 tablet  1  . hyaluronate sodium (RADIAPLEXRX) GEL Apply topically 2 (two) times daily.  No current facility-administered medications for this encounter.   Labs:  Lab Results  Component Value Date   WBC 11.3* 06/01/2012   HGB 14.3 06/01/2012   HCT 41.3 06/01/2012   MCV 84.5 06/01/2012   PLT 235 06/01/2012   Lab Results  Component Value Date   CREATININE 1.0 06/01/2012   BUN 8.0 06/01/2012   NA 135* 06/01/2012   K 3.6 06/01/2012   CL 100 06/01/2012   CO2 25 06/01/2012   Lab Results  Component Value Date   ALT 52 06/01/2012   AST 33 06/01/2012   BILITOT 0.82 06/01/2012    Physical Examination:  weight is 294 lb 11.2 oz (133.675 kg). His oral temperature is 98 F (36.7 C). His blood pressure is 130/86 and his pulse is 90. His respiration is 20.    Wt Readings from Last 3 Encounters:  06/02/12 294 lb 11.2 oz (133.675 kg)  06/01/12 294 lb 3.2 oz (133.448 kg)  05/14/12 292 lb (132.45 kg)   The neck is well healed without any palpable adenopathy. The oral cavity is moist  without secondary infection. Surgical changes are noted along the right oral tongue region. Lungs - Normal respiratory effort, chest expands symmetrically. Lungs are clear to auscultation, no crackles or wheezes.  Heart has regular rhythm and rate  Abdomen is soft and non tender with normal bowel sounds  Assessment:  Patient tolerating treatments well  Plan: Continue treatment per original radiation prescription

## 2012-06-02 NOTE — Progress Notes (Signed)
Post sim ed completed w/pt, charted under pt education appt. Pt denies pain, fatigue, loss of appetite./ he had first chemo yesterday, was nauseated last night. He took Compazine suppos w/good relief.

## 2012-06-02 NOTE — Progress Notes (Signed)
   Department of Radiation Oncology  Phone:  [phone] Fax:        [phone]  Intensity modulated radiation therapy device note  Today the patient had construction of his IMRT device to proceed with helical intensity modulated radiation therapy. The patient will be treated with several sinogram segments. This constitutes 1 IMRT device.  -----------------------------------  Billie Lade, PhD, MD

## 2012-06-02 NOTE — Telephone Encounter (Signed)
Patient states he had one episode of nausea since treatment yesterday, took meds and has done ok since. Encouraged patient to continue to eat and drink well with treatment.

## 2012-06-03 ENCOUNTER — Encounter: Payer: Self-pay | Admitting: Nutrition

## 2012-06-03 ENCOUNTER — Encounter: Payer: Self-pay | Admitting: Radiation Oncology

## 2012-06-03 ENCOUNTER — Ambulatory Visit: Payer: 59

## 2012-06-03 ENCOUNTER — Ambulatory Visit: Payer: Self-pay

## 2012-06-03 ENCOUNTER — Ambulatory Visit
Admission: RE | Admit: 2012-06-03 | Discharge: 2012-06-03 | Disposition: A | Discharge status: Home / Self Care | Payer: 59 | Source: Ambulatory Visit | Attending: Radiation Oncology | Admitting: Radiation Oncology

## 2012-06-03 NOTE — Progress Notes (Signed)
I called patient on telephone to followup. Patient reports he is having some nausea after chemotherapy. It is improved with Compazine however the smell of food aggravates his nausea. His weight has actually increased to 294.7 pounds may 6 from usual body weight of 290 pounds. Patient states he drinks boost when he is unable to eat.  Nutrition diagnosis:  Food and nutrition related knowledge deficit continues.  Intervention: I educated patient on strategies for improving nutrition impact symptoms such as nausea. I've encouraged patient to choose smaller, more frequent meals and snacks. He is to continue to drink oral nutrition supplements as needed to promote weight maintenance. Teach back method used.  Monitoring, evaluation, goals: Patient will tolerate calories and protein to minimize weight loss. If patient loses 5% of usual body weight tube feedings will be encouraged.  Next visit: Followup appointment made on Friday, May 16.

## 2012-06-03 NOTE — Progress Notes (Signed)
On 06/02/2012 the patient underwent Tomotherapy segmentation for treatment of his oral tongue carcinoma. He was set up to 8.9 delivered field widths representing one set of IMRT treatment devices [phone]).

## 2012-06-04 ENCOUNTER — Ambulatory Visit: Payer: 59

## 2012-06-04 ENCOUNTER — Ambulatory Visit
Admission: RE | Admit: 2012-06-04 | Discharge: 2012-06-04 | Disposition: A | Discharge status: Home / Self Care | Payer: 59 | Source: Ambulatory Visit | Attending: Radiation Oncology | Admitting: Radiation Oncology

## 2012-06-05 ENCOUNTER — Ambulatory Visit: Payer: 59

## 2012-06-05 ENCOUNTER — Ambulatory Visit
Admission: RE | Admit: 2012-06-05 | Discharge: 2012-06-05 | Disposition: A | Discharge status: Home / Self Care | Payer: 59 | Source: Ambulatory Visit | Attending: Radiation Oncology | Admitting: Radiation Oncology

## 2012-06-08 ENCOUNTER — Ambulatory Visit
Admission: RE | Admit: 2012-06-08 | Discharge: 2012-06-08 | Disposition: A | Discharge status: Home / Self Care | Payer: 59 | Source: Ambulatory Visit | Attending: Radiation Oncology | Admitting: Radiation Oncology

## 2012-06-08 ENCOUNTER — Ambulatory Visit: Payer: 59

## 2012-06-09 ENCOUNTER — Ambulatory Visit
Admission: RE | Admit: 2012-06-09 | Discharge: 2012-06-09 | Disposition: A | Discharge status: Home / Self Care | Payer: 59 | Source: Ambulatory Visit | Attending: Radiation Oncology | Admitting: Radiation Oncology

## 2012-06-09 ENCOUNTER — Telehealth: Payer: Self-pay | Admitting: Nutrition

## 2012-06-09 ENCOUNTER — Encounter: Payer: Self-pay | Admitting: Radiation Oncology

## 2012-06-09 ENCOUNTER — Encounter (HOSPITAL_COMMUNITY): Payer: Self-pay | Admitting: Dentistry

## 2012-06-09 ENCOUNTER — Ambulatory Visit (HOSPITAL_COMMUNITY): Payer: 59 | Admitting: Dentistry

## 2012-06-09 ENCOUNTER — Ambulatory Visit: Payer: 59

## 2012-06-09 VITALS — BP 132/75 | HR 90 | Temp 98.0°F | Wt 280.0 lb

## 2012-06-09 VITALS — BP 131/89 | HR 87 | Temp 98.0°F | Resp 20 | Wt 280.9 lb

## 2012-06-09 DIAGNOSIS — Z09 Encounter for follow-up examination after completed treatment for conditions other than malignant neoplasm: Secondary | ICD-10-CM

## 2012-06-09 DIAGNOSIS — Z0189 Encounter for other specified special examinations: Secondary | ICD-10-CM

## 2012-06-09 DIAGNOSIS — R432 Parageusia: Secondary | ICD-10-CM

## 2012-06-09 DIAGNOSIS — K117 Disturbances of salivary secretion: Secondary | ICD-10-CM

## 2012-06-09 DIAGNOSIS — C023 Malignant neoplasm of anterior two-thirds of tongue, part unspecified: Secondary | ICD-10-CM

## 2012-06-09 DIAGNOSIS — R634 Abnormal weight loss: Secondary | ICD-10-CM

## 2012-06-09 DIAGNOSIS — R252 Cramp and spasm: Secondary | ICD-10-CM

## 2012-06-09 DIAGNOSIS — C021 Malignant neoplasm of border of tongue: Secondary | ICD-10-CM

## 2012-06-09 NOTE — Telephone Encounter (Signed)
I called patient on the telephone. Patient continues to have nausea, especially with the smell of certain foods. His weight has declined to 280 pounds on May 13: this is a 3% weight loss from his usual body weight. He drinks 3 Ensure Plus a day and tolerates cereal and milk. He also can tolerate peanutbutter and jelly sandwiches.  Nutrition diagnosis: Food and nutrition related knowledge deficit continues.   New nutrition diagnosis: Inadequate oral intake related to nausea as evidenced by a 3% weight loss from usual body weight.  Intervention: Patient was educated to increase Ensure Plus to 4 times daily. I have encouraged patient to try using feeding tube with 60 cc of free water before and after one can of Ensure Plus. He is to continue to try to eat and drink as tolerated. I educated him on foods he could add to his meal intake. Teach back method used.  Monitoring, evaluation, goals: Patient will tolerate Ensure Plus 4 times a day along with increased calories and protein to minimize further weight loss.   Next visit: Followup appointment Friday, May 16.

## 2012-06-09 NOTE — Patient Instructions (Addendum)
RECOMMENDATIONS: 1. Brush after meals and at bedtime.  Use fluoride at bedtime. 2. Use trismus exercises as directed. 3. Use Biotene Rinse or salt water/baking soda rinses. 4. Multiple sips of water as needed. 5. Return to clinic in two months for periodic oral exam after radiation therapy.  Cindra Eves, DDS

## 2012-06-09 NOTE — Progress Notes (Addendum)
Pt denies pain; reports fatigue, loss of appetite, occasional nausea. He states he cannot eat certain foods due to nausea when he smells food. He is taking Zofran once daily, states Compazine "made him sleep all day". He is drinking Ensure 3 cans daily, eating cereals. Pt applying Radiaplex twice daily.

## 2012-06-09 NOTE — Progress Notes (Signed)
Spoke w/[employer] nutritionist, re: pt's weight loss. She will touch base w/pt.

## 2012-06-09 NOTE — Progress Notes (Signed)
06/09/2012  Patient:            [name] Date of Birth:  [birth year] MRN:                [id number]  BP 132/75  Pulse 90  Temp(Src) 98 F (36.7 C)  Wt 280 lb (127.007 kg)  BMI 35.93 kg/m2  [name] presents for periodic oral examination during radiation therapy. Patient has completed 6/35 radiation treatments.  REVIEW OF CHIEF COMPLAINTS:  DRY MOUTH: Yes HARD TO SWALLOW: No  HURT TO SWALLOW: No TASTE CHANGES: Some early changes SORES IN MOUTH: No sores TRISMUS: Patient not having any trismus symptoms. Patient is working with physical therapy on trismus exercises. WEIGHT: 280 pounds with loss of 14 pounds at start of radiation therapy  HOME OH REGIMEN:  BRUSHING: After meals and at bedtime FLOSSING: Yes RINSING: Biotene rinses FLUORIDE: Fluoride at bedtime TRISMUS EXERCISES:  Maximum interincisal opening: 30 mm   DENTAL EXAM:  Oral Hygiene:(PLAQUE): No plaque noted very good oral hygiene LOCATION OF MUCOSITIS: None noted DESCRIPTION OF SALIVA: Decreased and foamy saliva. Incipient xerostomia. ANY EXPOSED BONE: None noted OTHER WATCHED AREAS: Previous extraction sites DX: Xerostomia, Dysgeusia, Weight Loss and Trimus  RECOMMENDATIONS: 1. Brush after meals and at bedtime.  Use fluoride at bedtime. 2. Use trismus exercises as directed. 3. Use Biotene Rinse or salt water/baking soda rinses. 4. Multiple sips of water as needed. 5. Return to clinic in two months for periodic oral exam after radiation therapy. Call if problems before then.  Charlynne Pander, DDS

## 2012-06-09 NOTE — Progress Notes (Signed)
[facility]    Radiation Oncology [address] [place]     Maryln Gottron, M.D. [place], [place] [address]               Billie Lade, M.D., Ph.D. Phone: [phone]      Molli Hazard A. Kathrynn Running, M.D. Fax: [phone]      Radene Gunning, M.D., Ph.D.         Lurline Hare, M.D.         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  Current Dose: 12 Gy  Current Fraction: 6  Planned Dose: 60 Gy  Narrative: [name] was seen today for weekly treatment management. The chart was checked and MVCT  were reviewed. He is tolerating the treatments recently well. He denies any sore throat or dysphagia. The patient has noticed changes in his taste, particularly water. patient is not working at this time.  Nausea is controlled well with Zofran  Review of patient's allergies indicates no known allergies. Current Outpatient Prescriptions  Medication Sig Dispense Refill  . buPROPion (WELLBUTRIN SR) 150 MG 12 hr tablet Take 150 mg by mouth 2 (two) times daily.       . chlorhexidine (PERIDEX) 0.12 % solution Rinse with 15 mls twice daily for 30 seconds. Use after breakfast and at bedtime. Spit out excess. Do not swallow.  480 mL  prn  . chlorproMAZINE (THORAZINE) 25 MG tablet Take 1 tablet (25 mg total) by mouth 3 (three) times daily.  25 tablet  1  . HYDROcodone-acetaminophen (HYCET) 7.5-325 mg/15 ml solution Take 10-15 mLs by mouth every 4 (four) hours as needed.  480 mL  1  . ibuprofen (ADVIL,MOTRIN) 200 MG tablet Take 400 mg by mouth every 6 (six) hours as needed for pain.      Marland Kitchen LORazepam (ATIVAN) 0.5 MG tablet Take 1 tablet (0.5 mg total) by mouth every 6 (six) hours as needed (anticipatory  nausea or vomiting).  30 tablet  1  . ondansetron (ZOFRAN) 8 MG tablet Take 1 tablet (8 mg total) by mouth every 12 (twelve) hours as needed. Take two times a day as needed for nausea or vomiting starting on the third day after chemotherapy.  30 tablet  3  . prochlorperazine (COMPAZINE) 10 MG tablet Take 1 tablet (10 mg total) by mouth every 6 (six) hours as needed (Nausea or vomiting).  30 tablet  1  . prochlorperazine (COMPAZINE) 25 MG suppository Place 1 suppository (25 mg total) rectally every 12 (twelve) hours as needed for nausea.  12 suppository  3  . simvastatin (ZOCOR) 10 MG tablet Take 10 mg by mouth every evening.       . sodium fluoride (FLUORISHIELD) 1.1 % GEL dental gel Instill one drop of gel per tooth space of fluoride tray. Place over teeth for 5 minutes. Remove. Spit out excess. Repeat nightly.  120 mL  prn  . hyaluronate sodium (RADIAPLEXRX) GEL Apply topically 2 (two) times daily.       No current facility-administered medications for this encounter.   Labs:  Lab Results  Component Value Date  WBC 11.3* 06/01/2012   HGB 14.3 06/01/2012   HCT 41.3 06/01/2012   MCV 84.5 06/01/2012   PLT 235 06/01/2012   Lab Results  Component Value Date   CREATININE 1.0 06/01/2012   BUN 8.0 06/01/2012   NA 135* 06/01/2012   K 3.6 06/01/2012   CL 100 06/01/2012   CO2 25 06/01/2012   Lab Results  Component Value Date   ALT 52 06/01/2012   AST 33 06/01/2012   BILITOT 0.82 06/01/2012    Physical Examination:  weight is 280 lb 14.4 oz (127.415 kg). His oral temperature is 98 F (36.7 C). His blood pressure is 131/89 and his pulse is 87. His respiration is 20.    Wt Readings from Last 3 Encounters:  06/09/12 280 lb 14.4 oz (127.415 kg)  06/02/12 294 lb 11.2 oz (133.675 kg)  06/01/12 294 lb 3.2 oz (133.448 kg)    The oral cavity is moist without secondary infection. There is minimal erythema to the skin of the neck area. Lungs - Normal respiratory effort, chest expands symmetrically. Lungs are clear to  auscultation, no crackles or wheezes.  Heart has regular rhythm and rate  Abdomen is soft and non tender with normal bowel sounds.  Feeding tube in place  Assessment:  Patient tolerating treatments well  Plan: Continue treatment per original radiation prescription

## 2012-06-10 ENCOUNTER — Ambulatory Visit (HOSPITAL_COMMUNITY): Payer: Self-pay | Admitting: Dentistry

## 2012-06-10 ENCOUNTER — Ambulatory Visit
Admission: RE | Admit: 2012-06-10 | Discharge: 2012-06-10 | Disposition: A | Discharge status: Home / Self Care | Payer: 59 | Source: Ambulatory Visit | Attending: Radiation Oncology | Admitting: Radiation Oncology

## 2012-06-11 ENCOUNTER — Ambulatory Visit: Payer: Self-pay | Admitting: Oncology

## 2012-06-11 ENCOUNTER — Other Ambulatory Visit: Payer: Self-pay | Admitting: Lab

## 2012-06-11 ENCOUNTER — Ambulatory Visit
Admission: RE | Admit: 2012-06-11 | Discharge: 2012-06-11 | Disposition: A | Discharge status: Home / Self Care | Payer: 59 | Source: Ambulatory Visit | Attending: Radiation Oncology | Admitting: Radiation Oncology

## 2012-06-12 ENCOUNTER — Other Ambulatory Visit: Payer: Self-pay | Admitting: Oncology

## 2012-06-12 ENCOUNTER — Ambulatory Visit: Payer: 59 | Admitting: Nutrition

## 2012-06-12 ENCOUNTER — Ambulatory Visit
Admission: RE | Admit: 2012-06-12 | Discharge: 2012-06-12 | Disposition: A | Discharge status: Home / Self Care | Payer: 59 | Source: Ambulatory Visit | Attending: Radiation Oncology | Admitting: Radiation Oncology

## 2012-06-12 NOTE — Progress Notes (Signed)
Patient presents to nutrition followup. He reports he is feeling better every day. He is able to eat more solid foods. He does continue to drink 3 Ensure Plus daily. His weight has increased to 282.2 pounds May 16 from 280.9 pounds May 13. Patient states he did feel ill the week after chemotherapy. His nausea was controlled with his nausea medication. He denies mouth sores and constipation at this time. Patient reports taste alterations.  Nutrition diagnosis: Food and nutrition related knowledge deficit has improved.  Diagnosis of inadequate oral intake has improved.  Intervention: Patient was educated to continue strategies for adequate calories and protein to promote weight maintenance. Patient able to teach back sources of protein in his diet. Suggestions for taste alterations were given to patient. I educated him to continue Ensure Plus 3 times a day. I also educated him to continue flushing feeding tube with water on a daily basis. Teach back method used.  Monitoring, evaluation, goals: Patient will tolerate oral diet with Ensure Plus to promote weight stabilization. Tube feedings will be initiated if patient loses 5% of his usual body weight.  Next visit: Tuesday, May 27, during chemotherapy

## 2012-06-15 ENCOUNTER — Ambulatory Visit: Payer: 59 | Attending: Otolaryngology | Admitting: Physical Therapy

## 2012-06-15 ENCOUNTER — Ambulatory Visit
Admission: RE | Admit: 2012-06-15 | Discharge: 2012-06-15 | Disposition: A | Discharge status: Home / Self Care | Payer: 59 | Source: Ambulatory Visit | Attending: Radiation Oncology | Admitting: Radiation Oncology

## 2012-06-15 ENCOUNTER — Telehealth: Payer: Self-pay | Admitting: Oncology

## 2012-06-15 DIAGNOSIS — R5381 Other malaise: Secondary | ICD-10-CM | POA: Insufficient documentation

## 2012-06-15 DIAGNOSIS — IMO0001 Reserved for inherently not codable concepts without codable children: Secondary | ICD-10-CM | POA: Insufficient documentation

## 2012-06-15 NOTE — Telephone Encounter (Signed)
s.w. pt and advised on 5.20.14 appt...pt ok and aware °

## 2012-06-16 ENCOUNTER — Ambulatory Visit
Admission: RE | Admit: 2012-06-16 | Discharge: 2012-06-16 | Disposition: A | Discharge status: Home / Self Care | Payer: 59 | Source: Ambulatory Visit | Attending: Radiation Oncology | Admitting: Radiation Oncology

## 2012-06-16 ENCOUNTER — Other Ambulatory Visit (HOSPITAL_BASED_OUTPATIENT_CLINIC_OR_DEPARTMENT_OTHER): Payer: 59

## 2012-06-16 VITALS — BP 132/83 | HR 83 | Temp 98.0°F | Resp 20 | Wt 278.6 lb

## 2012-06-16 DIAGNOSIS — C023 Malignant neoplasm of anterior two-thirds of tongue, part unspecified: Secondary | ICD-10-CM

## 2012-06-16 DIAGNOSIS — C029 Malignant neoplasm of tongue, unspecified: Secondary | ICD-10-CM

## 2012-06-16 LAB — COMPREHENSIVE METABOLIC PANEL (CC13)
Albumin: 3.9 g/dL (ref 3.5–5.0)
CO2: 28 mEq/L (ref 22–29)
Glucose: 100 mg/dl — ABNORMAL HIGH (ref 70–99)
Potassium: 4 mEq/L (ref 3.5–5.1)
Sodium: 139 mEq/L (ref 136–145)
Total Protein: 7.6 g/dL (ref 6.4–8.3)

## 2012-06-16 LAB — CBC WITH DIFFERENTIAL/PLATELET
Eosinophils Absolute: 0.1 10*3/uL (ref 0.0–0.5)
MONO#: 0.6 10*3/uL (ref 0.1–0.9)
NEUT#: 4.3 10*3/uL (ref 1.5–6.5)
RBC: 4.72 10*6/uL (ref 4.20–5.82)
RDW: 12.9 % (ref 11.0–14.6)
WBC: 5.5 10*3/uL (ref 4.0–10.3)

## 2012-06-16 MED ORDER — MAGIC MOUTHWASH: 15.0000 mL | Freq: Four times a day (QID) | ORAL | Status: DC

## 2012-06-16 MED ORDER — HYDROCODONE-ACETAMINOPHEN 7.5-325 MG/15ML PO SOLN: 15.0000 mL | ORAL | Status: DC | PRN

## 2012-06-16 NOTE — Progress Notes (Signed)
[facility]    Radiation Oncology [address] [place]     Maryln Gottron, M.D. [place], [place] [address]               Billie Lade, M.D., Ph.D. Phone: [phone]      Molli Hazard A. Kathrynn Running, M.D. Fax: [phone]      Radene Gunning, M.D., Ph.D.         Lurline Hare, M.D.         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  Current Dose: 22 Gy  Current Fraction: 11  Planned Dose: 60 Gy  Narrative: [name] was seen today for weekly treatment management. The chart was checked and MVCT  were reviewed. He is starting to have some soreness in his mouth area. He has run out of his Hycet and I've refilled this prescription for him today. I've also given him a prescription for Magic mouthwash. He has noticed a soreness along the right buccal mucosa area.  His by mouth intake has dropped off significantly over the past few days. He did take in 6 cans of Ensure through his feeding tube yesterday.  Review of patient's allergies indicates no known allergies.  Current Outpatient Prescriptions  Medication Sig Dispense Refill  . buPROPion (WELLBUTRIN SR) 150 MG 12 hr tablet Take 150 mg by mouth 2 (two) times daily.       . chlorhexidine (PERIDEX) 0.12 % solution Rinse with 15 mls twice daily for 30 seconds. Use after breakfast and at bedtime. Spit out excess. Do not swallow.  480 mL  prn  . chlorproMAZINE (THORAZINE) 25 MG tablet Take 1 tablet (25 mg total) by mouth 3 (three) times daily.  25 tablet  1  . hyaluronate sodium (RADIAPLEXRX) GEL Apply topically 2 (two) times daily.      Marland Kitchen HYDROcodone-acetaminophen (HYCET) 7.5-325 mg/15 ml solution Take 10-15 mLs by mouth every 4 (four) hours  as needed.  480 mL  1  . ibuprofen (ADVIL,MOTRIN) 200 MG tablet Take 400 mg by mouth every 6 (six) hours as needed for pain.      Marland Kitchen LORazepam (ATIVAN) 0.5 MG tablet Take 1 tablet (0.5 mg total) by mouth every 6 (six) hours as needed (anticipatory nausea or vomiting).  30 tablet  1  . ondansetron (ZOFRAN) 8 MG tablet Take 1 tablet (8 mg total) by mouth every 12 (twelve) hours as needed. Take two times a day as needed for nausea or vomiting starting on the third day after chemotherapy.  30 tablet  3  . prochlorperazine (COMPAZINE) 10 MG tablet Take 1 tablet (10 mg total) by mouth every 6 (six) hours as needed (Nausea or vomiting).  30 tablet  1  . prochlorperazine (COMPAZINE) 25 MG suppository Place 1 suppository (25 mg total) rectally every 12 (twelve) hours as needed for nausea.  12 suppository  3  . simvastatin (ZOCOR) 10 MG tablet Take 10 mg by mouth every evening.       . sodium fluoride (FLUORISHIELD) 1.1 % GEL dental gel Instill one drop of gel per tooth space of fluoride tray. Place over  teeth for 5 minutes. Remove. Spit out excess. Repeat nightly.  120 mL  prn  . Alum & Mag Hydroxide-Simeth (MAGIC MOUTHWASH) SOLN Take 15 mLs by mouth 4 (four) times daily.  473 mL  2  . HYDROcodone-acetaminophen (HYCET) 7.5-325 mg/15 ml solution Take 15 mLs by mouth every 4 (four) hours as needed for pain.  473 mL  0   No current facility-administered medications for this encounter.   Labs:  Lab Results  Component Value Date   WBC 5.5 06/16/2012   HGB 13.7 06/16/2012   HCT 40.1 06/16/2012   MCV 84.8 06/16/2012   PLT 189 06/16/2012   Lab Results  Component Value Date   CREATININE 1.1 06/16/2012   BUN 12.2 06/16/2012   NA 139 06/16/2012   K 4.0 06/16/2012   CL 100 06/16/2012   CO2 28 06/16/2012   Lab Results  Component Value Date   ALT 28 06/16/2012   AST 17 06/16/2012   BILITOT 0.59 06/16/2012    Physical Examination:  weight is 278 lb 9.6 oz (126.372 kg). His oral temperature is 98 F (36.7 C). His  blood pressure is 132/83 and his pulse is 83. His respiration is 20.    Wt Readings from Last 3 Encounters:  06/16/12 278 lb 9.6 oz (126.372 kg)  06/12/12 282 lb 3.2 oz (128.005 kg)  06/09/12 280 lb (127.007 kg)    The neck area shows erythema. No skin breakdown is appreciated. Oral cavity reveals thickened saliva. There is a ulcer along the right buccal mucosa. This looks as if patient may have the bit his buccal mucosa by accident Lungs - Normal respiratory effort, chest expands symmetrically. Lungs are clear to auscultation, no crackles or wheezes.  Heart has regular rhythm and rate  Abdomen is soft and non tender with normal bowel sounds  Assessment:  Patient tolerating treatments well except for above issues  Plan: Continue treatment per original radiation prescription.  The patient will be seen again later this week for examination of his oral cavity

## 2012-06-16 NOTE — Progress Notes (Signed)
Pt reports thickening saliva, "sores on both sides of my mouth that feel like cold sores and feeling something in the back of my throat". Pt has white sores in both cheeks at molars, no evidence of thrush noted. Taking Hycet w/good relief, requests refill today. Discussed gargling w/soda/salt water for saliva. Pt taking in Ensure 8 cans plus water per peg tube daily, only PO's is water. Pt is fatigued.

## 2012-06-17 ENCOUNTER — Ambulatory Visit
Admission: RE | Admit: 2012-06-17 | Discharge: 2012-06-17 | Disposition: A | Discharge status: Home / Self Care | Payer: 59 | Source: Ambulatory Visit | Attending: Radiation Oncology | Admitting: Radiation Oncology

## 2012-06-18 ENCOUNTER — Encounter: Payer: Self-pay | Admitting: Radiation Oncology

## 2012-06-18 ENCOUNTER — Ambulatory Visit
Admission: RE | Admit: 2012-06-18 | Discharge: 2012-06-18 | Disposition: A | Discharge status: Home / Self Care | Payer: 59 | Source: Ambulatory Visit | Attending: Radiation Oncology | Admitting: Radiation Oncology

## 2012-06-18 ENCOUNTER — Ambulatory Visit: Payer: 59 | Admitting: Physical Therapy

## 2012-06-18 ENCOUNTER — Other Ambulatory Visit: Payer: Self-pay | Admitting: Oncology

## 2012-06-18 VITALS — BP 123/81 | HR 97 | Temp 98.4°F | Resp 20 | Wt 275.1 lb

## 2012-06-18 DIAGNOSIS — C023 Malignant neoplasm of anterior two-thirds of tongue, part unspecified: Secondary | ICD-10-CM

## 2012-06-18 MED ORDER — FLUCONAZOLE 100 MG PO TABS: 100.0000 mg | ORAL_TABLET | Freq: Every day | ORAL | Status: DC

## 2012-06-18 MED ORDER — FENTANYL 25 MCG/HR TD PT72: 1.0000 | MEDICATED_PATCH | TRANSDERMAL | Status: DC

## 2012-06-18 NOTE — Progress Notes (Signed)
[facility]    Radiation Oncology [address] [place]     [name], M.D. [place], [place] [address]               Billie Lade, M.D., Ph.D. Phone: [phone]      Molli Hazard A. Kathrynn Running, M.D. Fax: [phone]      Radene Gunning, M.D., Ph.D.         Lurline Hare, M.D.         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  Current Dose: 26 Gy  Current Fraction: 13  Planned Dose: 60 Gy  Narrative: [name] was seen today for weekly treatment management. The chart was checked and MVCT  were reviewed. He continues to have a lot of pain in the oral cavity and throat. He can drink some water but no significant liquids or solid foods by mouth at this time.  He can take small pills. He is taking in approximately 6 cans of Ensure per day and a lot of free water in addition through his feeding.  Review of patient's allergies indicates no known allergies.  Current Outpatient Prescriptions  Medication Sig Dispense Refill  . Alum & Mag Hydroxide-Simeth (MAGIC MOUTHWASH) SOLN Take 15 mLs by mouth 4 (four) times daily.  473 mL  2  . Alum & Mag Hydroxide-Simeth (MAGIC MOUTHWASH) SOLN Take by mouth.      Marland Kitchen buPROPion (WELLBUTRIN SR) 150 MG 12 hr tablet Take 150 mg by mouth 2 (two) times daily.       . chlorhexidine (PERIDEX) 0.12 % solution Rinse with 15 mls twice daily for 30 seconds. Use after breakfast and at bedtime. Spit out excess. Do not swallow.  480 mL  prn  . chlorproMAZINE (THORAZINE) 25 MG tablet Take 1 tablet (25 mg total) by mouth 3 (three) times daily.  25 tablet  1  . fentaNYL (DURAGESIC - DOSED MCG/HR) 25 MCG/HR Place 1 patch (25 mcg total) onto the skin every 3 (three)  days.  5 patch  0  . fluconazole (DIFLUCAN) 100 MG tablet Take 1 tablet (100 mg total) by mouth daily.  8 tablet  0  . hyaluronate sodium (RADIAPLEXRX) GEL Apply topically 2 (two) times daily.      Marland Kitchen HYDROcodone-acetaminophen (HYCET) 7.5-325 mg/15 ml solution Take 10-15 mLs by mouth every 4 (four) hours as needed.  480 mL  1  . HYDROcodone-acetaminophen (HYCET) 7.5-325 mg/15 ml solution Take 15 mLs by mouth every 4 (four) hours as needed for pain.  473 mL  0  . ibuprofen (ADVIL,MOTRIN) 200 MG tablet Take 400 mg by mouth every 6 (six) hours as needed for pain.      Marland Kitchen LORazepam (ATIVAN) 0.5 MG tablet Take 1 tablet (0.5 mg total) by mouth every 6 (six) hours as needed (anticipatory nausea or vomiting).  30 tablet  1  . ondansetron (ZOFRAN) 8 MG tablet Take 1 tablet (8 mg total) by mouth every 12 (twelve) hours as needed. Take two times a day as needed for nausea or vomiting starting on the third day after chemotherapy.  30 tablet  3  . prochlorperazine (COMPAZINE) 10 MG tablet Take 1 tablet (10 mg total) by mouth every 6 (six) hours as needed (Nausea or vomiting).  30 tablet  1  . prochlorperazine (COMPAZINE) 25 MG suppository Place 1 suppository (25 mg total) rectally every 12 (twelve) hours as needed for nausea.  12 suppository  3  . simvastatin (ZOCOR) 10 MG tablet Take 10 mg by mouth every evening.       . sodium fluoride (FLUORISHIELD) 1.1 % GEL dental gel Instill one drop of gel per tooth space of fluoride tray. Place over teeth for 5 minutes. Remove. Spit out excess. Repeat nightly.  120 mL  prn   No current facility-administered medications for this encounter.   Labs:  Lab Results  Component Value Date   WBC 5.5 06/16/2012   HGB 13.7 06/16/2012   HCT 40.1 06/16/2012   MCV 84.8 06/16/2012   PLT 189 06/16/2012   Lab Results  Component Value Date   CREATININE 1.1 06/16/2012   BUN 12.2 06/16/2012   NA 139 06/16/2012   K 4.0 06/16/2012   CL 100 06/16/2012   CO2 28 06/16/2012   Lab Results   Component Value Date   ALT 28 06/16/2012   AST 17 06/16/2012   BILITOT 0.59 06/16/2012    Physical Examination:  weight is 275 lb 1.6 oz (124.785 kg). His oral temperature is 98.4 F (36.9 C). His blood pressure is 123/81 and his pulse is 97. His respiration is 20 and oxygen saturation is 97%.    Wt Readings from Last 3 Encounters:  06/18/12 275 lb 1.6 oz (124.785 kg)  06/16/12 278 lb 9.6 oz (126.372 kg)  06/12/12 282 lb 3.2 oz (128.005 kg)    The oral cavity shows thickened saliva but slightly whitish coating to his tongue region. He continues have ulcer along the right buccal mucosal region which is approximately a centimeter in size. He has mucositis throughout his oral cavity and posterior pharynx. No hemorrhagic mucositis is noted. Lungs - Normal respiratory effort, chest expands symmetrically. Lungs are clear to auscultation, no crackles or wheezes.  Heart has regular rhythm and rate  Abdomen is soft and non tender with normal bowel sounds  Assessment:  Patient is having side effects with this treatment as above. His hydrocodone as not taken care of his pain at this time and have given him a prescription for a Duragesic patch.  I am concerned the patient may have a candidial infection and we'll place the patient on Diflucan.  He understands not taken simvastatin during his Diflucan therapy.  I am unsure whether he would benefit from antiviral medication but will be seen in medical oncology tomorrow.  Plan: Continue treatment per original radiation prescription

## 2012-06-18 NOTE — Progress Notes (Addendum)
Weekly re check  Rad txs tongue/neck, 13 completed, alert,oriented x3, erythmea on neck, skin intact, loss of 3 more pounds  In 2 days, took in 6 cans ensure via peg  Tube  Daily, takes it via peg q 2 hours stated,  Flushes free water 60cc x 2 after each ensure, drinks water , mouth still hurts when anything touches it, mmw 4x day now, pain a 2-3 in amouth on 1-10 pain scale, took ortho vitals, sitting b/p=128/73,p=90,  123/81,p=+7, rr=20, T=98.4, room air sats =97% using biafine cream to neck area bid 8:52 AM standing b/p=

## 2012-06-19 ENCOUNTER — Ambulatory Visit
Admission: RE | Admit: 2012-06-19 | Discharge: 2012-06-19 | Disposition: A | Discharge status: Home / Self Care | Payer: 59 | Source: Ambulatory Visit | Attending: Radiation Oncology | Admitting: Radiation Oncology

## 2012-06-19 ENCOUNTER — Telehealth: Payer: Self-pay | Admitting: Oncology

## 2012-06-19 ENCOUNTER — Ambulatory Visit (HOSPITAL_BASED_OUTPATIENT_CLINIC_OR_DEPARTMENT_OTHER): Payer: 59 | Admitting: Oncology

## 2012-06-19 ENCOUNTER — Other Ambulatory Visit (HOSPITAL_BASED_OUTPATIENT_CLINIC_OR_DEPARTMENT_OTHER): Payer: 59

## 2012-06-19 VITALS — BP 130/85 | HR 93 | Temp 98.9°F | Resp 18 | Ht 74.0 in | Wt 272.6 lb

## 2012-06-19 DIAGNOSIS — C023 Malignant neoplasm of anterior two-thirds of tongue, part unspecified: Secondary | ICD-10-CM

## 2012-06-19 LAB — CBC WITH DIFFERENTIAL/PLATELET
BASO%: 0.3 % (ref 0.0–2.0)
Basophils Absolute: 0 10*3/uL (ref 0.0–0.1)
EOS%: 0.8 % (ref 0.0–7.0)
Eosinophils Absolute: 0 10*3/uL (ref 0.0–0.5)
HCT: 40.4 % (ref 38.4–49.9)
HGB: 14.1 g/dL (ref 13.0–17.1)
LYMPH%: 5.9 % — ABNORMAL LOW (ref 14.0–49.0)
MCH: 29.7 pg (ref 27.2–33.4)
MCHC: 34.9 g/dL (ref 32.0–36.0)
MCV: 85.2 fL (ref 79.3–98.0)
MONO#: 0.6 10*3/uL (ref 0.1–0.9)
MONO%: 12.2 % (ref 0.0–14.0)
NEUT#: 4.2 10*3/uL (ref 1.5–6.5)
NEUT%: 80.8 % — ABNORMAL HIGH (ref 39.0–75.0)
Platelets: 215 10*3/uL (ref 140–400)
RBC: 4.74 10*6/uL (ref 4.20–5.82)
RDW: 13.4 % (ref 11.0–14.6)
WBC: 5.2 10*3/uL (ref 4.0–10.3)
lymph#: 0.3 10*3/uL — ABNORMAL LOW (ref 0.9–3.3)

## 2012-06-19 LAB — BASIC METABOLIC PANEL (CC13)
Glucose: 104 mg/dl — ABNORMAL HIGH (ref 70–99)
Potassium: 4.5 mEq/L (ref 3.5–5.1)
Sodium: 138 mEq/L (ref 136–145)

## 2012-06-19 NOTE — Patient Instructions (Addendum)
1. Diagnosis: Head and neck cancer. 2. Treatment: Weekly cisplatin chemotherapy and daily radiation. 3. Status: due for 2nd dose of chemo next week.  4. Mouth sore: Take fentanyl pain patch and liquid pain medication as needed. 5. To prevent esophageal stricture: Please performs swallowing exercise as much as possible during the day at least 3 times a day. 6. To thin out the thick phlegm: Please perform mouth rinses with salt and baking soda alternating with diluted hydrogen peroxide (5cc peroxide + 20cc of water):  Every 2-3 hours.

## 2012-06-22 NOTE — Progress Notes (Signed)
[facility]  Telephone:([phone]) [phone] [phone]   OFFICE PROGRESS NOTE   Cc:  [name], MD  DIAGNOSIS: recurrent tongue squamous cell carcinoma.   PAST THERAPY:  partial right hemiglossectomy on 08/22/2011 with pathology case number [id number] consistent with invasive squamous cell carcinoma measured 1.3 cm. Margin was not involved. He underwent right neck dissection on 09/06/2011 with pathology case number [id number] showing 1 the 24 lymph nodes in levels one 2 and 3 containing squamous cell carcinoma. He also has reresection of the right tongue which showed ulcerated squamous mucosa with underlying acute and chronic inflammation, granulation tissue formation and giant cell reaction without tumor seen. He was on observation until earlier  2014 he noticed and also in the right side of the tongue. On 03/23/2012, he underwent resection of the right tongue with pathology case number [id number] showed invasive well-differentiated squamous cell carcinoma, spanning 1.5 cm in greatest dimension. Margins were negative. There was no lymphovascular invasion. The tumor did invade into the underlying muscle. He underwent left neck dissection on 04/23/2012 with 2/34 positive nodes.   CURRENT THERAPY:  started adjuvant chemoradiation with q3 weeks cisplatin and daily radiation on 06/01/2012.   INTERVAL HISTORY: [name] 32 y.o. male returns for regular follow up with his mother.  He reported that he is having more mouth sore.  He was recently started on Fentanyl patch 49mcg/hr less than a week.  He still needs Hycet elixir for breakthrough pain.  He takes in about 6 cans of boost via his PEG tube. He is able to drink sips of water. He has thick oral secretion.  He is no longer working. He reports mild to moderate fatigue.  He is independent of all activities of daily living; but he gets tired. He takes several naps during the day. He denies nausea vomiting, tinnitus, hearing  loss, fever, abdominal pain, PEG tube drainage, discharge from PEG tube.  He has skin erythema in the bilateral neck from radiation for which he is taking biotin cream.  The rest of the 14-point review of system was negative.      Past Medical History  Diagnosis Date  . High triglycerides   . Gout   . Staph skin infection     Elbow- resolved.    Marland Kitchen PONV (postoperative nausea and vomiting)   . Depression     "patient quit smoking was put on wellbutrin to assist"  . Head and neck cancer     tongue cancer  . Stones in the urinary tract   . GERD (gastroesophageal reflux disease)     occ    Past Surgical History  Procedure Laterality Date  . Mouth surgery      biopsy- (08/06/2011 Dr. Barbette Merino), also had tissue graft (oral) 2010  . Glossectomy    . Glossectomy  08/22/2011    Procedure: GLOSSECTOMY;  Surgeon: Serena Colonel, MD;  Location: [facility] OR;  Service: ENT;  Laterality: Right;  WITH FROZEN SECTION  . Glossectomy  09/06/2011    Procedure: GLOSSECTOMY;  Surgeon: Serena Colonel, MD;  Location: [facility] OR;  Service: ENT;  Laterality: Right;  Right Tongue Re-Excision  . Radical neck dissection  09/06/2011    Procedure: RADICAL NECK DISSECTION;  Surgeon: Serena Colonel, MD;  Location: [facility] OR;  Service: ENT;  Laterality: Right;  Right Modified Neck Dissection   . Hemiglossectomy N/A 03/23/2012    Procedure: BIOPSY OF TONGUE WITH FROZEN SECTION AND RIGHT HEMIGLOSSECTOMY;  Surgeon: Serena Colonel, MD;  Location:  [facility] OR;  Service: ENT;  Laterality: N/A;  . Tracheostomy tube placement N/A 03/23/2012    Procedure: TRACHEOSTOMY;  Surgeon: Serena Colonel, MD;  Location: [facility] OR;  Service: ENT;  Laterality: N/A;  . Tongue biopsy    . Tracheostomy closure      14  . Gastrostomy w/ feeding tube  2/14  . Radical neck dissection Left 04/23/2012    Procedure: LEFT MODIFIED  NECK DISSECTION;  Surgeon: Serena Colonel, MD;  Location: [facility] OR;  Service: ENT;  Laterality: Left;  Left Supra Omo Hyoid Neck Dissection     Current Outpatient  Prescriptions  Medication Sig Dispense Refill  . Alum & Mag Hydroxide-Simeth (MAGIC MOUTHWASH) SOLN Take 15 mLs by mouth 4 (four) times daily.  473 mL  2  . Alum & Mag Hydroxide-Simeth (MAGIC MOUTHWASH) SOLN Take by mouth.      Marland Kitchen buPROPion (WELLBUTRIN SR) 150 MG 12 hr tablet Take 150 mg by mouth 2 (two) times daily.       . chlorhexidine (PERIDEX) 0.12 % solution Rinse with 15 mls twice daily for 30 seconds. Use after breakfast and at bedtime. Spit out excess. Do not swallow.  480 mL  prn  . chlorproMAZINE (THORAZINE) 25 MG tablet Take 1 tablet (25 mg total) by mouth 3 (three) times daily.  25 tablet  1  . fentaNYL (DURAGESIC - DOSED MCG/HR) 25 MCG/HR Place 1 patch (25 mcg total) onto the skin every 3 (three) days.  5 patch  0  . fluconazole (DIFLUCAN) 100 MG tablet Take 1 tablet (100 mg total) by mouth daily.  8 tablet  0  . hyaluronate sodium (RADIAPLEXRX) GEL Apply topically 2 (two) times daily.      Marland Kitchen HYDROcodone-acetaminophen (HYCET) 7.5-325 mg/15 ml solution Take 10-15 mLs by mouth every 4 (four) hours as needed.  480 mL  1  . HYDROcodone-acetaminophen (HYCET) 7.5-325 mg/15 ml solution Take 15 mLs by mouth every 4 (four) hours as needed for pain.  473 mL  0  . ibuprofen (ADVIL,MOTRIN) 200 MG tablet Take 400 mg by mouth every 6 (six) hours as needed for pain.      Marland Kitchen LORazepam (ATIVAN) 0.5 MG tablet Take 1 tablet (0.5 mg total) by mouth every 6 (six) hours as needed (anticipatory nausea or vomiting).  30 tablet  1  . ondansetron (ZOFRAN) 8 MG tablet Take 1 tablet (8 mg total) by mouth every 12 (twelve) hours as needed. Take two times a day as needed for nausea or vomiting starting on the third day after chemotherapy.  30 tablet  3  . prochlorperazine (COMPAZINE) 10 MG tablet Take 1 tablet (10 mg total) by mouth every 6 (six) hours as needed (Nausea or vomiting).  30 tablet  1  . prochlorperazine (COMPAZINE) 25 MG suppository Place 1 suppository (25 mg total) rectally every 12 (twelve) hours as  needed for nausea.  12 suppository  3  . simvastatin (ZOCOR) 10 MG tablet Take 10 mg by mouth every evening.       . sodium fluoride (FLUORISHIELD) 1.1 % GEL dental gel Instill one drop of gel per tooth space of fluoride tray. Place over teeth for 5 minutes. Remove. Spit out excess. Repeat nightly.  120 mL  prn   No current facility-administered medications for this visit.    ALLERGIES:  has No Known Allergies.  REVIEW OF SYSTEMS:  The rest of the 14-point review of system was negative.   Filed Vitals:   06/19/12 1127  BP: 130/85  Pulse: 93  Temp: 98.9 F (37.2 C)  Resp: 18   Wt Readings from Last 3 Encounters:  06/19/12 272 lb 9.6 oz (123.651 kg)  06/18/12 275 lb 1.6 oz (124.785 kg)  06/16/12 278 lb 9.6 oz (126.372 kg)   ECOG Performance status: 1  PHYSICAL EXAMINATION:    General: well-nourished man, in no acute distress. Eyes: no scleral icterus. ENT: erythema with thick oral secretion.  There was some residual oral thrush. Neck was without thyromegaly. Lymphatics: Negative for supraclavicular or axillary adenopathy. His bilateral neck dissection scars have healed up nicely. I did not feel any cervical adenopathy.  Respiratory: lungs were clear bilaterally without wheezing or crackles. Cardiovascular: Regular rate and rhythm, S1/S2, without murmur, rub or gallop. There was no pedal edema. GI: abdomen was soft, flat, nontender, nondistended, without organomegaly. PEG tube in place, dry, clean, intact without pain. Muscoloskeletal: no spinal tenderness of palpation of vertebral spine. Skin exam was without echymosis, petichae. Neuro exam was nonfocal. Patient was able to get on and off exam table without assistance. Gait was normal. Patient was alert and oriented. Attention was good. Language was appropriate. Mood was normal without depression. Speech was not pressured. Thought content was not tangential.       LABORATORY/RADIOLOGY DATA:  Lab Results  Component Value Date    WBC 5.2 06/19/2012   HGB 14.1 06/19/2012   HCT 40.4 06/19/2012   PLT 215 06/19/2012   GLUCOSE 104* 06/19/2012   ALKPHOS 80 06/16/2012   ALT 28 06/16/2012   AST 17 06/16/2012   NA 138 06/19/2012   K 4.5 06/19/2012   CL 99 06/19/2012   CREATININE 1.2 06/19/2012   BUN 11.4 06/19/2012   CO2 28 06/19/2012   INR 0.97 04/15/2012     ASSESSMENT AND PLAN:   1.  Recurrent head/neck cancer. - He is s/p one cycle of chemo 3 weeks ago.  He has grade 2 mucositis, grade 1 skin rash, grade 1-2 fatigue.  There are not dose limiting toxicities yet.  I recommended to proceed with the 2nd out of 3 doses of Cisplatin without dose modification next week.   2.  Nausea/vomiting prophy:  He has Compazine/Zofran/Ativan prn.  3.  Mucositis prevention:  I recommended salt/baking soda mouth rinse after meals.  I encouraged him to perform swallowing exercise to prevent esophageal stricture.  I advised him to start diluted H2O2 (5cc H2O2 + 20cc water) to thin out his thick oral secretion.   If this is not effective, then we should try mixture of lidocaine and Rotitussin.   4.  Oral thrush:  On Diflucan.  5.  Mucositis pain:  He has Fentanyl patch and Hycet.  Next week, if pain is increasing, we may need to increase Fentanyl patch.   6.  Follow up:  In about 10 days for mid cycle check and return visit with me in about 3 weeks before the last planned dose of chemo.     The length of time of the face-to-face encounter was 25  minutes. More than 50% of time was spent counseling and coordination of care.

## 2012-06-23 ENCOUNTER — Ambulatory Visit
Admission: RE | Admit: 2012-06-23 | Discharge: 2012-06-23 | Disposition: A | Discharge status: Home / Self Care | Payer: 59 | Source: Ambulatory Visit | Attending: Radiation Oncology | Admitting: Radiation Oncology

## 2012-06-23 ENCOUNTER — Telehealth: Payer: Self-pay | Admitting: *Deleted

## 2012-06-23 ENCOUNTER — Encounter: Payer: Self-pay | Admitting: Radiation Oncology

## 2012-06-23 ENCOUNTER — Ambulatory Visit (HOSPITAL_BASED_OUTPATIENT_CLINIC_OR_DEPARTMENT_OTHER): Payer: 59

## 2012-06-23 ENCOUNTER — Ambulatory Visit: Payer: 59 | Admitting: Nutrition

## 2012-06-23 VITALS — BP 127/86 | HR 108 | Temp 98.6°F | Resp 20 | Wt 273.3 lb

## 2012-06-23 DIAGNOSIS — C023 Malignant neoplasm of anterior two-thirds of tongue, part unspecified: Secondary | ICD-10-CM

## 2012-06-23 DIAGNOSIS — C029 Malignant neoplasm of tongue, unspecified: Secondary | ICD-10-CM

## 2012-06-23 DIAGNOSIS — Z5111 Encounter for antineoplastic chemotherapy: Secondary | ICD-10-CM

## 2012-06-23 MED ORDER — PALONOSETRON HCL INJECTION 0.25 MG/5ML
0.2500 mg | Freq: Once | INTRAVENOUS | Status: AC
  Administered 2012-06-23: 0.25 mg via INTRAVENOUS

## 2012-06-23 MED ORDER — MANNITOL 25 % IV SOLN
Freq: Once | INTRAVENOUS | Status: AC
  Administered 2012-06-23: 10:00:00 via INTRAVENOUS
  Filled 2012-06-23: qty 10

## 2012-06-23 MED ORDER — HYDROCODONE-ACETAMINOPHEN 7.5-325 MG/15ML PO SOLN: 10.0000 mL | ORAL | Status: DC | PRN

## 2012-06-23 MED ORDER — ONDANSETRON HCL 8 MG PO TABS: 8.0000 mg | ORAL_TABLET | Freq: Two times a day (BID) | ORAL | Status: DC | PRN

## 2012-06-23 MED ORDER — SODIUM CHLORIDE 0.9 % IV SOLN
100.0000 mg/m2 | Freq: Once | INTRAVENOUS | Status: AC
  Administered 2012-06-23: 257 mg via INTRAVENOUS
  Filled 2012-06-23: qty 257

## 2012-06-23 MED ORDER — FENTANYL 50 MCG/HR TD PT72: 1.0000 | MEDICATED_PATCH | TRANSDERMAL | Status: DC

## 2012-06-23 MED ORDER — DEXAMETHASONE SODIUM PHOSPHATE 20 MG/5ML IJ SOLN
12.0000 mg | Freq: Once | INTRAMUSCULAR | Status: AC
  Administered 2012-06-23: 12 mg via INTRAVENOUS

## 2012-06-23 MED ORDER — FOSAPREPITANT DIMEGLUMINE INJECTION 150 MG
150.0000 mg | Freq: Once | INTRAVENOUS | Status: AC
  Administered 2012-06-23: 150 mg via INTRAVENOUS
  Filled 2012-06-23: qty 5

## 2012-06-23 NOTE — Progress Notes (Signed)
Pt reports "a little pain in his mouth", denies other pain. Pt on Diflucan since last Thurs, has white coating on right side of tongue mouth.  Wearing Duragesic 25 mcg patch since last Tues. He needs refill on Hycet, Zofran today. He states he has had very little nausea. Pt taking in 6 cans nutritional supplement per peg tube daily, sipping water. He is using water, peroxide mouth rinse per Dr Gaylyn Rong for thick saliva. Pt applying Radiaplex to neck treatment area for hyperpigmentation, some dry desquamation. Pt has slight fatigue.

## 2012-06-23 NOTE — Patient Instructions (Addendum)
[facility] Discharge Instructions for Patients Receiving Chemotherapy  Today you received the following chemotherapy agents: Cisplatin  To help prevent nausea and vomiting after your treatment, we encourage you to take your nausea medication: Ativan, zofran, compazine. Take Compazine every six hours as needed for the next 72 hours. For nausea on or after 5/30 use Zofran.  You may use Ativan as needed for nausea as well. This will make you drowsy. Do not drive.   If you develop nausea and vomiting that is not controlled by your nausea medication, call the clinic.   BELOW ARE SYMPTOMS THAT SHOULD BE REPORTED IMMEDIATELY:  *FEVER GREATER THAN 100.5 F  *CHILLS WITH OR WITHOUT FEVER  NAUSEA AND VOMITING THAT IS NOT CONTROLLED WITH YOUR NAUSEA MEDICATION  *UNUSUAL SHORTNESS OF BREATH  *UNUSUAL BRUISING OR BLEEDING  TENDERNESS IN MOUTH AND THROAT WITH OR WITHOUT PRESENCE OF ULCERS  *URINARY PROBLEMS  *BOWEL PROBLEMS  UNUSUAL RASH Items with * indicate a potential emergency and should be followed up as soon as possible.   The clinic phone number is [phone].

## 2012-06-23 NOTE — Telephone Encounter (Signed)
error 

## 2012-06-23 NOTE — Progress Notes (Signed)
Patient reports mouth sores. He is drinking a bit of water by mouth but using his tube for nutrition support. He states he uses 1 can of "generic" Ensure Plus via feeding tube with 8 ounces of water 6 times a day to provide approximately 2100 calories, 78 g protein, 2520 mL free water. Patient's weight was documented as 272.6 pounds may 23rd which is decreased from 282.2 pounds May 16.  Nutrition diagnosis: Food and nutrition related knowledge deficit and inadequate oral intake both continue.  Estimated nutrition needs: 2500-2750 calories, 1:30-145 g protein, 2.8 L fluid.  Intervention: I educated patient to increase oral nutrition supplement to 7 cans daily to provide an additional 350 calories and 13 g protein. I recommended patient began whey protein powder to provide approximately 60 g additional protein. This tube feeding regimen will provide approximately 2650 calories, 138 g protein, and 2757 mL free water. This will meet 100% of patient's estimated needs. Patient also educated on strategies for decreasing thick saliva. Patient and wife verbalized understanding. Teach back method used.  Monitoring, evaluation, goals: Patient will tolerate increase in bolus tube feeding along with whey protein powder to meet 100% of estimated needs and promote weight maintenance and healing.  Next visit: Monday, June 16, during chemotherapy. Patient agrees to contact me if he is unable to tolerate these recommendations.

## 2012-06-23 NOTE — Progress Notes (Signed)
[facility]    Radiation Oncology [address] [place]     Maryln Gottron, M.D. [place], [place] [address]               Billie Lade, M.D., Ph.D. Phone: [phone]      Molli Hazard A. Kathrynn Running, M.D. Fax: [phone]      Radene Gunning, M.D., Ph.D.         Lurline Hare, M.D.         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  Current Dose: 30 Gy  Current Fraction: 15  Planned Dose: 60 Gy  Narrative: [name] was seen today for weekly treatment management. The chart was checked and MVCT  were reviewed. He continues to have a lot of pain in the oral cavity and throat area. His current Duragesic and Hycet medications are not adequate. I have increased his Duragesic to 50 mcg per hour. Today I refilled his Hycet and Zofran.   Current Outpatient Prescriptions  Medication Sig Dispense Refill  . Alum & Mag Hydroxide-Simeth (MAGIC MOUTHWASH) SOLN Take 15 mLs by mouth 4 (four) times daily.  473 mL  2  . Alum & Mag Hydroxide-Simeth (MAGIC MOUTHWASH) SOLN Take by mouth.      Marland Kitchen buPROPion (WELLBUTRIN SR) 150 MG 12 hr tablet Take 150 mg by mouth 2 (two) times daily.       . chlorhexidine (PERIDEX) 0.12 % solution Rinse with 15 mls twice daily for 30 seconds. Use after breakfast and at bedtime. Spit out excess. Do not swallow.  480 mL  prn  . chlorproMAZINE (THORAZINE) 25 MG tablet Take 1 tablet (25 mg total) by mouth 3 (three) times daily.  25 tablet  1  . fluconazole (DIFLUCAN) 100 MG tablet Take 1 tablet (100 mg total) by mouth daily.  8 tablet  0  . hyaluronate sodium (RADIAPLEXRX) GEL Apply topically 2 (two) times daily.      Marland Kitchen HYDROcodone-acetaminophen (HYCET) 7.5-325 mg/15 ml  solution Take 15 mLs by mouth every 4 (four) hours as needed for pain.  473 mL  0  . HYDROcodone-acetaminophen (HYCET) 7.5-325 mg/15 ml solution Take 10-15 mLs by mouth every 4 (four) hours as needed.  480 mL  1  . ibuprofen (ADVIL,MOTRIN) 200 MG tablet Take 400 mg by mouth every 6 (six) hours as needed for pain.      Marland Kitchen LORazepam (ATIVAN) 0.5 MG tablet Take 1 tablet (0.5 mg total) by mouth every 6 (six) hours as needed (anticipatory nausea or vomiting).  30 tablet  1  . ondansetron (ZOFRAN) 8 MG tablet Take 1 tablet (8 mg total) by mouth every 12 (twelve) hours as needed. Take two times a day as needed for nausea or vomiting starting on the third day after chemotherapy.  30 tablet  3  . prochlorperazine (COMPAZINE) 10 MG tablet Take 1 tablet (10 mg total) by mouth every 6 (six) hours as needed (Nausea or vomiting).  30 tablet  1  . prochlorperazine (COMPAZINE) 25 MG suppository Place 1 suppository (25 mg total) rectally every 12 (twelve) hours as needed for nausea.  12 suppository  3  . simvastatin (ZOCOR) 10 MG tablet Take 10 mg by mouth every evening.       . sodium fluoride (FLUORISHIELD) 1.1 % GEL dental gel Instill one drop of gel per tooth space of fluoride tray. Place over teeth for 5 minutes. Remove. Spit out excess. Repeat nightly.  120 mL  prn  . fentaNYL (DURAGESIC) 50 MCG/HR Place 1 patch (50 mcg total) onto the skin every 3 (three) days.  5 patch  0   No current facility-administered medications for this encounter.   Facility-Administered Medications Ordered in Other Encounters  Medication Dose Route Frequency Provider Last Rate Last Dose  . dextrose 5 % and 0.45% NaCl 1,000 mL with potassium chloride 20 mEq, magnesium sulfate 12 mEq, mannitol 12.5 g infusion   Intravenous Once Exie Parody, MD       Labs:  Lab Results  Component Value Date   WBC 5.2 06/19/2012   HGB 14.1 06/19/2012   HCT 40.4 06/19/2012   MCV 85.2 06/19/2012   PLT 215 06/19/2012   Lab Results  Component Value Date    CREATININE 1.2 06/19/2012   BUN 11.4 06/19/2012   NA 138 06/19/2012   K 4.5 06/19/2012   CL 99 06/19/2012   CO2 28 06/19/2012   Lab Results  Component Value Date   ALT 28 06/16/2012   AST 17 06/16/2012   BILITOT 0.59 06/16/2012    Physical Examination:  weight is 273 lb 4.8 oz (123.968 kg). His oral temperature is 98.6 F (37 C). His blood pressure is 127/86 and his pulse is 108. His respiration is 20.    Wt Readings from Last 3 Encounters:  06/23/12 273 lb 4.8 oz (123.968 kg)  06/19/12 272 lb 9.6 oz (123.651 kg)  06/18/12 275 lb 1.6 oz (124.785 kg)    The throat shows thickened saliva. There's no signs of hemorrhagic mucositis. There is a whitish coating throughout the oral cavity and tongue region.  The patient continues on Diflucan Lungs - Normal respiratory effort, chest expands symmetrically. Lungs are clear to auscultation, no crackles or wheezes.  Heart has regular rhythm and rate  Abdomen is soft and non tender with normal bowel sounds  Assessment:  Patient tolerating treatments well except for above issues  Plan: Continue treatment per original radiation prescription.

## 2012-06-23 NOTE — Progress Notes (Signed)
OK to treat pt with CBC results from 5/23, per Dr. Gaylyn Rong.

## 2012-06-24 ENCOUNTER — Ambulatory Visit
Admission: RE | Admit: 2012-06-24 | Discharge: 2012-06-24 | Disposition: A | Discharge status: Home / Self Care | Payer: 59 | Source: Ambulatory Visit | Attending: Radiation Oncology | Admitting: Radiation Oncology

## 2012-06-25 ENCOUNTER — Ambulatory Visit
Admission: RE | Admit: 2012-06-25 | Discharge: 2012-06-25 | Disposition: A | Discharge status: Home / Self Care | Payer: 59 | Source: Ambulatory Visit | Attending: Radiation Oncology | Admitting: Radiation Oncology

## 2012-06-25 ENCOUNTER — Ambulatory Visit: Payer: 59 | Admitting: Physical Therapy

## 2012-06-25 ENCOUNTER — Ambulatory Visit: Payer: 59

## 2012-06-26 ENCOUNTER — Ambulatory Visit
Admission: RE | Admit: 2012-06-26 | Discharge: 2012-06-26 | Disposition: A | Discharge status: Home / Self Care | Payer: 59 | Source: Ambulatory Visit | Attending: Radiation Oncology | Admitting: Radiation Oncology

## 2012-06-26 ENCOUNTER — Ambulatory Visit: Payer: 59

## 2012-06-27 ENCOUNTER — Ambulatory Visit: Payer: 59

## 2012-06-29 ENCOUNTER — Ambulatory Visit: Payer: 59 | Attending: Radiation Oncology

## 2012-06-29 ENCOUNTER — Ambulatory Visit
Admission: RE | Admit: 2012-06-29 | Discharge: 2012-06-29 | Disposition: A | Discharge status: Home / Self Care | Payer: 59 | Source: Ambulatory Visit | Attending: Radiation Oncology | Admitting: Radiation Oncology

## 2012-06-29 ENCOUNTER — Ambulatory Visit: Payer: 59

## 2012-06-29 ENCOUNTER — Ambulatory Visit: Payer: 59 | Admitting: Nutrition

## 2012-06-29 DIAGNOSIS — IMO0001 Reserved for inherently not codable concepts without codable children: Secondary | ICD-10-CM | POA: Insufficient documentation

## 2012-06-29 DIAGNOSIS — C023 Malignant neoplasm of anterior two-thirds of tongue, part unspecified: Secondary | ICD-10-CM | POA: Insufficient documentation

## 2012-06-29 DIAGNOSIS — R1310 Dysphagia, unspecified: Secondary | ICD-10-CM | POA: Insufficient documentation

## 2012-06-29 NOTE — Progress Notes (Signed)
Patient and wife present to my office distressed about patient's inability to tolerate oral intake or tube feeding consistently. Patient complains that increased mucus results in vomiting of tube feeding after bolus feeds. This happens occasionally but not all the time. Per patient's wife, she is mixing one can of Ensure Plus with 8 ounces of water and infusing 16 ounces within 5 minutes. Last weight documented 273.3 pounds on May 27.  Nutrition diagnosis: Food and nutrition related knowledge deficit and inadequate oral intake both continue.  Intervention: I've educated patient and wife to change bolus feeding as follows: He will flush tube with 90 mL of free water before and after bolus feeding. He will infuse one can of Osmolite 1.5 over 15-20 minutes. He is not to mix more water with this product. He was educated to be sure to allow enough time for product to be slowly introduced into the stomach at room temperature. He will try one can every 3 hours while awake. His eventual goal rate continues to be 7 cans a day. Once tolerance established, I will work to increase total amount of enteral formula at each feeding to achieve goal rate. New tube feeding instructions were written out. Fact sheets provided to patient and wife. Teach back method used. Sample formula provided for patient.  Monitoring, evaluation, goals: Patient will tolerate bolus tube feedings to promote weight maintenance and healing.  Next visit: I will followup with patient by phone this week to evaluate tube feeding tolerance. Next scheduled appointment is Monday, June 16, during chemotherapy.

## 2012-06-30 ENCOUNTER — Ambulatory Visit
Admission: RE | Admit: 2012-06-30 | Discharge: 2012-06-30 | Disposition: A | Discharge status: Home / Self Care | Payer: 59 | Source: Ambulatory Visit | Attending: Radiation Oncology | Admitting: Radiation Oncology

## 2012-06-30 VITALS — BP 112/81 | HR 94 | Temp 98.2°F | Ht 74.0 in | Wt 266.5 lb

## 2012-06-30 DIAGNOSIS — C023 Malignant neoplasm of anterior two-thirds of tongue, part unspecified: Secondary | ICD-10-CM

## 2012-06-30 MED ORDER — SILVER SULFADIAZINE 1 % EX CREA
TOPICAL_CREAM | Freq: Two times a day (BID) | CUTANEOUS | Status: DC
  Administered 2012-06-30: 10:00:00 via TOPICAL

## 2012-06-30 NOTE — Addendum Note (Signed)
Encounter addended by: Eduardo Osier, RN on: 06/30/2012  9:42 AM<BR>     Documentation filed: Orders

## 2012-06-30 NOTE — Progress Notes (Signed)
[facility]    Radiation Oncology [address] [place]     Maryln Gottron, M.D. [place], [place] [address]               Billie Lade, M.D., Ph.D. Phone: [phone]      Molli Hazard A. Kathrynn Running, M.D. Fax: [phone]      Radene Gunning, M.D., Ph.D.         Lurline Hare, M.D.         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  Current Dose: 40  Current Fraction: 20/30  Planned Dose: 60  Narrative: [name] was seen today for weekly treatment management. The chart was checked and MVCT  were reviewed. He is bothered by his thick tenacious sputum. This will cause him to gag and occasionally vomit.  He is using Magic mouthwash as well as peroxide rinses and baking soda/salt.  His pain is controlled well with 50 mcg of Duragesic.  He is using his short acting medication very little at this time.  He has had some intolerance to tube feeding but was educated by the nutritionist as documented in the chart. This is helping the patient to tolerate his tube feedings better. He has lost approximately 6-7 pounds over the past week.  Review of patient's allergies indicates no known allergies.  Current Outpatient Prescriptions  Medication Sig Dispense Refill  . Alum & Mag Hydroxide-Simeth (MAGIC MOUTHWASH) SOLN Take 15 mLs by mouth 4 (four) times daily.  473 mL  2  . Alum & Mag Hydroxide-Simeth (MAGIC MOUTHWASH) SOLN Take by mouth.      Marland Kitchen buPROPion (WELLBUTRIN SR) 150 MG 12 hr tablet Take 150 mg by mouth 2 (two) times daily.       . fentaNYL (DURAGESIC) 50 MCG/HR Place 1 patch (50 mcg total) onto the skin every 3 (three) days.  5 patch  0  . hyaluronate sodium (RADIAPLEXRX) GEL Apply topically  2 (two) times daily.      Marland Kitchen HYDROcodone-acetaminophen (HYCET) 7.5-325 mg/15 ml solution Take 15 mLs by mouth every 4 (four) hours as needed for pain.  473 mL  0  . HYDROcodone-acetaminophen (HYCET) 7.5-325 mg/15 ml solution Take 10-15 mLs by mouth every 4 (four) hours as needed.  480 mL  1  . ibuprofen (ADVIL,MOTRIN) 200 MG tablet Take 400 mg by mouth every 6 (six) hours as needed for pain.      Marland Kitchen LORazepam (ATIVAN) 0.5 MG tablet Take 1 tablet (0.5 mg total) by mouth every 6 (six) hours as needed (anticipatory nausea or vomiting).  30 tablet  1  . ondansetron (ZOFRAN) 8 MG tablet Take 1 tablet (8 mg total) by mouth every 12 (twelve) hours as needed. Take two times a day as needed for nausea or vomiting starting on the third day after chemotherapy.  30 tablet  3  . prochlorperazine (COMPAZINE) 10 MG tablet Take 1 tablet (10 mg total) by mouth every 6 (six) hours as needed (Nausea or vomiting).  30 tablet  1  . prochlorperazine (COMPAZINE) 25 MG suppository Place 1 suppository (25 mg total) rectally every 12 (  twelve) hours as needed for nausea.  12 suppository  3  . silver sulfADIAZINE (SILVADENE) 1 % cream Apply 1 application topically 2 (two) times daily.      . simvastatin (ZOCOR) 10 MG tablet Take 10 mg by mouth every evening.       . sodium fluoride (FLUORISHIELD) 1.1 % GEL dental gel Instill one drop of gel per tooth space of fluoride tray. Place over teeth for 5 minutes. Remove. Spit out excess. Repeat nightly.  120 mL  prn  . chlorhexidine (PERIDEX) 0.12 % solution Rinse with 15 mls twice daily for 30 seconds. Use after breakfast and at bedtime. Spit out excess. Do not swallow.  480 mL  prn  . chlorproMAZINE (THORAZINE) 25 MG tablet Take 1 tablet (25 mg total) by mouth 3 (three) times daily.  25 tablet  1  . fluconazole (DIFLUCAN) 100 MG tablet Take 1 tablet (100 mg total) by mouth daily.  8 tablet  0   No current facility-administered medications for this encounter.   Labs:  Lab Results   Component Value Date   WBC 5.2 06/19/2012   HGB 14.1 06/19/2012   HCT 40.4 06/19/2012   MCV 85.2 06/19/2012   PLT 215 06/19/2012   Lab Results  Component Value Date   CREATININE 1.2 06/19/2012   BUN 11.4 06/19/2012   NA 138 06/19/2012   K 4.5 06/19/2012   CL 99 06/19/2012   CO2 28 06/19/2012   Lab Results  Component Value Date   ALT 28 06/16/2012   AST 17 06/16/2012   BILITOT 0.59 06/16/2012    Physical Examination:  height is 6\' 2"  (1.88 m) and weight is 266 lb 8 oz (120.884 kg). His temperature is 98.2 F (36.8 C). His blood pressure is 112/81 and his pulse is 94.    Wt Readings from Last 3 Encounters:  06/30/12 266 lb 8 oz (120.884 kg)  06/23/12 273 lb 4.8 oz (123.968 kg)  06/19/12 272 lb 9.6 oz (123.651 kg)   The oral cavity shows mucositis with thick tenacious sputum. No obvious candidiasis infection. 1 small area of skin breakdown on the right posterior neck. This appears to be related to scratching. Patient will be given Silvadene to this area  And to continue RadiaPlex elsewhere appear Lungs - Normal respiratory effort, chest expands symmetrically. Lungs are clear to auscultation, no crackles or wheezes.  Heart has regular rhythm and rate  Abdomen is soft and non tender with normal bowel sounds  Assessment:  Patient tolerating treatments reasonably well except for issues as above  Plan: Continue treatment per original radiation prescription.  He has one more cycle of chemotherapy remaining.

## 2012-06-30 NOTE — Addendum Note (Signed)
Encounter addended by: Eduardo Osier, RN on: 06/30/2012  9:43 AM<BR>     Documentation filed: Inpatient MAR

## 2012-06-30 NOTE — Progress Notes (Addendum)
Mr. [name] here for weekly under treat visit.  He has had 20 fractions to his tongue.  He denies pain and fatigue.  He has lost 5 lbs in the last week.  He states his saliva is very thick and is gaging him and making him throw up.  He has been using the peridex which is not helping. He is wondering if there is something else he can use to rinse his mouth with.  He does have two open sores in his mouth.  He is using magic mouthwash.    He denies trouble opening his jaw.  He is doing his jaw exercises.  He does have pain with swallowing.  The skin on his right neck is red.  He has a small open area at the bottom of his right neck.  He is using radiaplex gel.  Late entry: gave Mr [name] Silvadene per Dr. Roselind Messier.  He was advised to apply it twice a day with the first application after treatment and the second at bedtime.  He was advised to use a glove or q-tip to apply the silvadene to keep the jar uncontaminated.

## 2012-07-01 ENCOUNTER — Ambulatory Visit
Admission: RE | Admit: 2012-07-01 | Discharge: 2012-07-01 | Disposition: A | Discharge status: Home / Self Care | Payer: 59 | Source: Ambulatory Visit | Attending: Radiation Oncology | Admitting: Radiation Oncology

## 2012-07-02 ENCOUNTER — Ambulatory Visit
Admission: RE | Admit: 2012-07-02 | Discharge: 2012-07-02 | Disposition: A | Discharge status: Home / Self Care | Payer: 59 | Source: Ambulatory Visit | Attending: Radiation Oncology | Admitting: Radiation Oncology

## 2012-07-02 ENCOUNTER — Other Ambulatory Visit (HOSPITAL_BASED_OUTPATIENT_CLINIC_OR_DEPARTMENT_OTHER): Payer: 59 | Admitting: Lab

## 2012-07-02 ENCOUNTER — Encounter: Payer: Self-pay | Admitting: Oncology

## 2012-07-02 ENCOUNTER — Ambulatory Visit (HOSPITAL_BASED_OUTPATIENT_CLINIC_OR_DEPARTMENT_OTHER): Payer: 59 | Admitting: Oncology

## 2012-07-02 VITALS — BP 119/85 | HR 86 | Temp 97.9°F | Resp 18 | Ht 74.0 in | Wt 263.0 lb

## 2012-07-02 DIAGNOSIS — R5381 Other malaise: Secondary | ICD-10-CM

## 2012-07-02 DIAGNOSIS — R5383 Other fatigue: Secondary | ICD-10-CM

## 2012-07-02 DIAGNOSIS — C023 Malignant neoplasm of anterior two-thirds of tongue, part unspecified: Secondary | ICD-10-CM

## 2012-07-02 DIAGNOSIS — C770 Secondary and unspecified malignant neoplasm of lymph nodes of head, face and neck: Secondary | ICD-10-CM

## 2012-07-02 LAB — BASIC METABOLIC PANEL (CC13)
CO2: 29 mEq/L (ref 22–29)
Chloride: 98 mEq/L (ref 98–107)
Creatinine: 1.4 mg/dL — ABNORMAL HIGH (ref 0.7–1.3)
Potassium: 4.1 mEq/L (ref 3.5–5.1)
Sodium: 138 mEq/L (ref 136–145)

## 2012-07-02 MED ORDER — LIDOCAINE VISCOUS 2 % MT SOLN: 15.0000 mL | OROMUCOSAL | Status: DC | PRN

## 2012-07-02 NOTE — Patient Instructions (Addendum)
You may use a mixture of Lidocaine and Robitussin (1:1 mixture) 3-4 times per day to thin out the thick mucus.

## 2012-07-02 NOTE — Progress Notes (Signed)
[facility]  [phone] [phone]   OFFICE PROGRESS NOTE   Cc:  [name], MD  DIAGNOSIS: recurrent tongue squamous cell carcinoma.   PAST THERAPY:  partial right hemiglossectomy on 08/22/2011 with pathology case number [id number] consistent with invasive squamous cell carcinoma measured 1.3 cm. Margin was not involved. He underwent right neck dissection on 09/06/2011 with pathology case number [id number] showing 1 the 24 lymph nodes in levels one 2 and 3 containing squamous cell carcinoma. He also has reresection of the right tongue which showed ulcerated squamous mucosa with underlying acute and chronic inflammation, granulation tissue formation and giant cell reaction without tumor seen. He was on observation until earlier  2014 he noticed and also in the right side of the tongue. On 03/23/2012, he underwent resection of the right tongue with pathology case number [id number] showed invasive well-differentiated squamous cell carcinoma, spanning 1.5 cm in greatest dimension. Margins were negative. There was no lymphovascular invasion. The tumor did invade into the underlying muscle. He underwent left neck dissection on 04/23/2012 with 2/34 positive nodes.   CURRENT THERAPY:  started adjuvant chemoradiation with q3 weeks cisplatin and daily radiation on 06/01/2012.   INTERVAL HISTORY: ZAKHAI Price 32 y.o. male returns for regular follow up by himself.  He reported that he is having more mouth sore.  Fentanyl patch was increased to 50 mcg/hr.  He still needs Hycet elixir for breakthrough pain, but not taking very frequently.  He takes in about 6 cans of boost via his PEG tube. He is able to drink sips of water. He has thick oral secretion; diluted peroxide is not helping. He is no longer working. He reports mild to moderate fatigue.  He is independent of all activities of daily living; but he gets tired. He takes several naps during the day. Reports  intermittent nausea. He denies vomiting, tinnitus, hearing loss, fever, abdominal pain, PEG tube drainage, discharge from PEG tube.  He has skin erythema in the bilateral neck from radiation for which he is taking biotin cream.  The rest of the 14-point review of system was negative.      Past Medical History  Diagnosis Date  . High triglycerides   . Gout   . Staph skin infection     Elbow- resolved.    Marland Kitchen PONV (postoperative nausea and vomiting)   . Depression     "patient quit smoking was put on wellbutrin to assist"  . Head and neck cancer     tongue cancer  . Stones in the urinary tract   . GERD (gastroesophageal reflux disease)     occ    Past Surgical History  Procedure Laterality Date  . Mouth surgery      biopsy- (08/06/2011 Dr. Barbette Merino), also had tissue graft (oral) 2010  . Glossectomy    . Glossectomy  08/22/2011    Procedure: GLOSSECTOMY;  Surgeon: Serena Colonel, MD;  Location: [facility] OR;  Service: ENT;  Laterality: Right;  WITH FROZEN SECTION  . Glossectomy  09/06/2011    Procedure: GLOSSECTOMY;  Surgeon: Serena Colonel, MD;  Location: [facility] OR;  Service: ENT;  Laterality: Right;  Right Tongue Re-Excision  . Radical neck dissection  09/06/2011    Procedure: RADICAL NECK DISSECTION;  Surgeon: Serena Colonel, MD;  Location: [facility] OR;  Service: ENT;  Laterality: Right;  Right Modified Neck Dissection   . Hemiglossectomy N/A 03/23/2012    Procedure: BIOPSY OF TONGUE WITH FROZEN SECTION AND RIGHT HEMIGLOSSECTOMY;  Surgeon:  Serena Colonel, MD;  Location: [facility] OR;  Service: ENT;  Laterality: N/A;  . Tracheostomy tube placement N/A 03/23/2012    Procedure: TRACHEOSTOMY;  Surgeon: Serena Colonel, MD;  Location: [facility] OR;  Service: ENT;  Laterality: N/A;  . Tongue biopsy    . Tracheostomy closure      14  . Gastrostomy w/ feeding tube  2/14  . Radical neck dissection Left 04/23/2012    Procedure: LEFT MODIFIED  NECK DISSECTION;  Surgeon: Serena Colonel, MD;  Location: [facility] OR;  Service: ENT;  Laterality: Left;  Left  Supra Omo Hyoid Neck Dissection     Current Outpatient Prescriptions  Medication Sig Dispense Refill  . Alum & Mag Hydroxide-Simeth (MAGIC MOUTHWASH) SOLN Take 15 mLs by mouth 4 (four) times daily.  473 mL  2  . Alum & Mag Hydroxide-Simeth (MAGIC MOUTHWASH) SOLN Take by mouth.      Marland Kitchen buPROPion (WELLBUTRIN SR) 150 MG 12 hr tablet Take 150 mg by mouth 2 (two) times daily.       . chlorhexidine (PERIDEX) 0.12 % solution Rinse with 15 mls twice daily for 30 seconds. Use after breakfast and at bedtime. Spit out excess. Do not swallow.  480 mL  prn  . chlorproMAZINE (THORAZINE) 25 MG tablet Take 1 tablet (25 mg total) by mouth 3 (three) times daily.  25 tablet  1  . fentaNYL (DURAGESIC) 50 MCG/HR Place 1 patch (50 mcg total) onto the skin every 3 (three) days.  5 patch  0  . fluconazole (DIFLUCAN) 100 MG tablet Take 1 tablet (100 mg total) by mouth daily.  8 tablet  0  . hyaluronate sodium (RADIAPLEXRX) GEL Apply topically 2 (two) times daily.      Marland Kitchen HYDROcodone-acetaminophen (HYCET) 7.5-325 mg/15 ml solution Take 15 mLs by mouth every 4 (four) hours as needed for pain.  473 mL  0  . HYDROcodone-acetaminophen (HYCET) 7.5-325 mg/15 ml solution Take 10-15 mLs by mouth every 4 (four) hours as needed.  480 mL  1  . ibuprofen (ADVIL,MOTRIN) 200 MG tablet Take 400 mg by mouth every 6 (six) hours as needed for pain.      Marland Kitchen lidocaine (XYLOCAINE) 2 % solution Take 15 mLs by mouth as needed for pain. 1:1 mixture with Robitussin to thin out thick mucus.  500 mL  2  . LORazepam (ATIVAN) 0.5 MG tablet Take 1 tablet (0.5 mg total) by mouth every 6 (six) hours as needed (anticipatory nausea or vomiting).  30 tablet  1  . ondansetron (ZOFRAN) 8 MG tablet Take 1 tablet (8 mg total) by mouth every 12 (twelve) hours as needed. Take two times a day as needed for nausea or vomiting starting on the third day after chemotherapy.  30 tablet  3  . prochlorperazine (COMPAZINE) 10 MG tablet Take 1 tablet (10 mg total) by mouth  every 6 (six) hours as needed (Nausea or vomiting).  30 tablet  1  . prochlorperazine (COMPAZINE) 25 MG suppository Place 1 suppository (25 mg total) rectally every 12 (twelve) hours as needed for nausea.  12 suppository  3  . simvastatin (ZOCOR) 10 MG tablet Take 10 mg by mouth every evening.       . sodium fluoride (FLUORISHIELD) 1.1 % GEL dental gel Instill one drop of gel per tooth space of fluoride tray. Place over teeth for 5 minutes. Remove. Spit out excess. Repeat nightly.  120 mL  prn   No current facility-administered medications for this visit.  ALLERGIES:  has No Known Allergies.  REVIEW OF SYSTEMS:  The rest of the 14-point review of system was negative.   Filed Vitals:   07/02/12 1312  BP: 119/85  Pulse: 86  Temp: 97.9 F (36.6 C)  Resp: 18   Wt Readings from Last 3 Encounters:  07/02/12 263 lb (119.296 kg)  06/30/12 266 lb 8 oz (120.884 kg)  06/23/12 273 lb 4.8 oz (123.968 kg)   ECOG Performance status: 1  PHYSICAL EXAMINATION:    General: well-nourished man, in no acute distress. Eyes: no scleral icterus. ENT: erythema with thick oral secretion.  There was some residual oral thrush. Neck was without thyromegaly. Lymphatics: Negative for supraclavicular or axillary adenopathy. His bilateral neck dissection scars have healed up nicely. I did not feel any cervical adenopathy.  Respiratory: lungs were clear bilaterally without wheezing or crackles. Cardiovascular: Regular rate and rhythm, S1/S2, without murmur, rub or gallop. There was no pedal edema. GI: abdomen was soft, flat, nontender, nondistended, without organomegaly. PEG tube in place, dry, clean, intact without pain. Muscoloskeletal: no spinal tenderness of palpation of vertebral spine. Skin exam was without echymosis, petichae. Neuro exam was nonfocal. Patient was able to get on and off exam table without assistance. Gait was normal. Patient was alert and oriented. Attention was good. Language was appropriate.  Mood was normal without depression. Speech was not pressured. Thought content was not tangential.    LABORATORY/RADIOLOGY DATA:  Lab Results  Component Value Date   WBC 5.2 06/19/2012   HGB 14.1 06/19/2012   HCT 40.4 06/19/2012   PLT 215 06/19/2012   GLUCOSE 125* 07/02/2012   ALKPHOS 80 06/16/2012   ALT 28 06/16/2012   AST 17 06/16/2012   NA 138 07/02/2012   K 4.1 07/02/2012   CL 98 07/02/2012   CREATININE 1.4* 07/02/2012   BUN 23.8 07/02/2012   CO2 29 07/02/2012   INR 0.97 04/15/2012     ASSESSMENT AND PLAN:   1.  Recurrent head/neck cancer. - He is s/p two cycles of Cisplatin given every 3 weeks.  He has grade 2 mucositis, grade 1 skin rash, grade 1-2 fatigue.  He has a mild increase in the Cr today. I have encouraged him to take in 1.5 to 2 liters of water per day  2.  Nausea/vomiting prophy:  He has Compazine/Zofran/Ativan prn.  3.  Mucositis prevention:  I recommended salt/baking soda mouth rinse after meals.  I encouraged him to perform swallowing exercise to prevent esophageal stricture. I have advised him abut using a mixture of lidocaine and Robitussin to thin the thick phlegm in the mouth.   4.  Oral thrush:  Resolved.  5.  Mucositis pain:  He has Fentanyl patch and Hycet.   6.  Follow up:  Prior to cycle 3.    The length of time of the face-to-face encounter was 15  minutes. More than 50% of time was spent counseling and coordination of care.

## 2012-07-03 ENCOUNTER — Telehealth: Payer: Self-pay | Admitting: Nutrition

## 2012-07-03 ENCOUNTER — Ambulatory Visit
Admission: RE | Admit: 2012-07-03 | Discharge: 2012-07-03 | Disposition: A | Discharge status: Home / Self Care | Payer: 59 | Source: Ambulatory Visit | Attending: Radiation Oncology | Admitting: Radiation Oncology

## 2012-07-03 ENCOUNTER — Ambulatory Visit: Payer: 59 | Admitting: Physical Therapy

## 2012-07-03 ENCOUNTER — Ambulatory Visit: Payer: 59 | Attending: Otolaryngology | Admitting: Physical Therapy

## 2012-07-03 DIAGNOSIS — IMO0001 Reserved for inherently not codable concepts without codable children: Secondary | ICD-10-CM | POA: Insufficient documentation

## 2012-07-03 DIAGNOSIS — R5381 Other malaise: Secondary | ICD-10-CM | POA: Insufficient documentation

## 2012-07-03 NOTE — Telephone Encounter (Signed)
Patient stopped by my office this morning requesting additional samples of Osmolite 1.5. He reports that he is tolerating this formula much better. He was unable to tell me at this time how many cans he was infusing daily. His goal is to work up to 7 cans a day with 90 mL free water before and after. Noted that weight continues to decrease and was documented as 263 pounds on June 5. I attempted to call patient for further information however he was unavailable. I do have a followup with patient scheduled on Monday, June 16, during his treatment. I will continue to followup with patient by phone as needed.

## 2012-07-06 ENCOUNTER — Ambulatory Visit: Payer: 59 | Admitting: Physical Therapy

## 2012-07-06 ENCOUNTER — Ambulatory Visit
Admission: RE | Admit: 2012-07-06 | Discharge: 2012-07-06 | Disposition: A | Discharge status: Home / Self Care | Payer: 59 | Source: Ambulatory Visit | Attending: Radiation Oncology | Admitting: Radiation Oncology

## 2012-07-07 ENCOUNTER — Ambulatory Visit
Admission: RE | Admit: 2012-07-07 | Discharge: 2012-07-07 | Disposition: A | Discharge status: Home / Self Care | Payer: 59 | Source: Ambulatory Visit | Attending: Radiation Oncology | Admitting: Radiation Oncology

## 2012-07-07 ENCOUNTER — Encounter: Payer: Self-pay | Admitting: Radiation Oncology

## 2012-07-07 VITALS — BP 135/83 | HR 126 | Temp 98.8°F | Resp 20 | Wt 263.0 lb

## 2012-07-07 DIAGNOSIS — C023 Malignant neoplasm of anterior two-thirds of tongue, part unspecified: Secondary | ICD-10-CM

## 2012-07-07 NOTE — Progress Notes (Signed)
Pt c/o pain in mouth, tongue 2/10. He takes Hycet 1-2 x daily to control pain. He c/o thick clear saliva which he coughs up w/o much difficulty. Applying Radiaplex to neck treatment area; he states "it hurts sometimes". One very small area of moist desquamation on back of neck noted. Pt is fatigued. Taking in 5-6 cans nutritional supplement through peg tube daily, sips of water PO.

## 2012-07-07 NOTE — Progress Notes (Signed)
[facility]    Radiation Oncology [address] [place]     Maryln Gottron, M.D. [place], [place] [address]               Billie Lade, M.D., Ph.D. Phone: [phone]      Molli Hazard A. Kathrynn Running, M.D. Fax: [phone]      Radene Gunning, M.D., Ph.D.         Lurline Hare, M.D.         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  Current Dose: 50 Gy  Current Fraction: 25  Planned Dose: 60 Gy  Narrative: [name] was seen today for weekly treatment management. The chart was checked and MVCT  were reviewed. He continues to have a lot of discomfort in the oral cavity. He was given Viscous Xylocaine by medical oncology which temporarily helps.  He denies any chills or fever. He continues to use Silvadene along his posterior neck region.  Review of patient's allergies indicates no known allergies.  Current Outpatient Prescriptions  Medication Sig Dispense Refill  . Alum & Mag Hydroxide-Simeth (MAGIC MOUTHWASH) SOLN Take 15 mLs by mouth 4 (four) times daily.  473 mL  2  . Alum & Mag Hydroxide-Simeth (MAGIC MOUTHWASH) SOLN Take by mouth.      Marland Kitchen buPROPion (WELLBUTRIN SR) 150 MG 12 hr tablet Take 150 mg by mouth 2 (two) times daily.       . chlorhexidine (PERIDEX) 0.12 % solution Rinse with 15 mls twice daily for 30 seconds. Use after breakfast and at bedtime. Spit out excess. Do not swallow.  480 mL  prn  . chlorproMAZINE (THORAZINE) 25 MG tablet Take 1 tablet (25 mg total) by mouth 3 (three) times daily.  25 tablet  1  . fentaNYL (DURAGESIC) 50 MCG/HR Place 1 patch (50 mcg total) onto the skin every 3 (three) days.  5 patch  0  . fluconazole (DIFLUCAN) 100 MG tablet Take 1 tablet (100  mg total) by mouth daily.  8 tablet  0  . hyaluronate sodium (RADIAPLEXRX) GEL Apply topically 2 (two) times daily.      Marland Kitchen HYDROcodone-acetaminophen (HYCET) 7.5-325 mg/15 ml solution Take 15 mLs by mouth every 4 (four) hours as needed for pain.  473 mL  0  . HYDROcodone-acetaminophen (HYCET) 7.5-325 mg/15 ml solution Take 10-15 mLs by mouth every 4 (four) hours as needed.  480 mL  1  . ibuprofen (ADVIL,MOTRIN) 200 MG tablet Take 400 mg by mouth every 6 (six) hours as needed for pain.      Marland Kitchen lidocaine (XYLOCAINE) 2 % solution Take 15 mLs by mouth as needed for pain. 1:1 mixture with Robitussin to thin out thick mucus.  500 mL  2  . LORazepam (ATIVAN) 0.5 MG tablet Take 1 tablet (0.5 mg total) by mouth every 6 (six) hours as needed (anticipatory nausea or vomiting).  30 tablet  1  . ondansetron (ZOFRAN) 8 MG tablet Take 1 tablet (8 mg total) by mouth every 12 (twelve) hours as needed. Take two times a day as needed for nausea or vomiting starting on the  third day after chemotherapy.  30 tablet  3  . prochlorperazine (COMPAZINE) 10 MG tablet Take 1 tablet (10 mg total) by mouth every 6 (six) hours as needed (Nausea or vomiting).  30 tablet  1  . prochlorperazine (COMPAZINE) 25 MG suppository Place 1 suppository (25 mg total) rectally every 12 (twelve) hours as needed for nausea.  12 suppository  3  . simvastatin (ZOCOR) 10 MG tablet Take 10 mg by mouth every evening.       . sodium fluoride (FLUORISHIELD) 1.1 % GEL dental gel Instill one drop of gel per tooth space of fluoride tray. Place over teeth for 5 minutes. Remove. Spit out excess. Repeat nightly.  120 mL  prn   No current facility-administered medications for this encounter.   Labs:  Lab Results  Component Value Date   WBC 5.2 06/19/2012   HGB 14.1 06/19/2012   HCT 40.4 06/19/2012   MCV 85.2 06/19/2012   PLT 215 06/19/2012   Lab Results  Component Value Date   CREATININE 1.4* 07/02/2012   BUN 23.8 07/02/2012   NA 138 07/02/2012   K 4.1  07/02/2012   CL 98 07/02/2012   CO2 29 07/02/2012   Lab Results  Component Value Date   ALT 28 06/16/2012   AST 17 06/16/2012   BILITOT 0.59 06/16/2012    Physical Examination:  weight is 263 lb (119.296 kg). His oral temperature is 98.8 F (37.1 C). His blood pressure is 135/83 and his pulse is 126. His respiration is 20.    Wt Readings from Last 3 Encounters:  07/07/12 263 lb (119.296 kg)  07/02/12 263 lb (119.296 kg)  06/30/12 266 lb 8 oz (120.884 kg)    The neck area shows erythema and dry desquamation but no significant moist desquamation. The oral cavity shows thick tenacious sputum. There's no signs of infection. The patient has an area of ulceration along the left buccal mucosal region. No hemorrhagic mucositis is noted Lungs - Normal respiratory effort, chest expands symmetrically. Lungs are clear to auscultation, no crackles or wheezes.  Heart has regular rhythm and rate  Abdomen is soft and non tender with normal bowel sounds  Assessment:  Patient tolerating treatments well except for issues as above  Plan: Continue treatment per original radiation prescription.  He has 5 treatments remaining.

## 2012-07-08 ENCOUNTER — Ambulatory Visit
Admission: RE | Admit: 2012-07-08 | Discharge: 2012-07-08 | Disposition: A | Discharge status: Home / Self Care | Payer: 59 | Source: Ambulatory Visit | Attending: Radiation Oncology | Admitting: Radiation Oncology

## 2012-07-09 ENCOUNTER — Ambulatory Visit
Admission: RE | Admit: 2012-07-09 | Discharge: 2012-07-09 | Disposition: A | Discharge status: Home / Self Care | Payer: 59 | Source: Ambulatory Visit | Attending: Radiation Oncology | Admitting: Radiation Oncology

## 2012-07-10 ENCOUNTER — Ambulatory Visit
Admission: RE | Admit: 2012-07-10 | Discharge: 2012-07-10 | Disposition: A | Discharge status: Home / Self Care | Payer: 59 | Source: Ambulatory Visit | Attending: Radiation Oncology | Admitting: Radiation Oncology

## 2012-07-10 ENCOUNTER — Other Ambulatory Visit (HOSPITAL_BASED_OUTPATIENT_CLINIC_OR_DEPARTMENT_OTHER): Payer: 59

## 2012-07-10 ENCOUNTER — Ambulatory Visit (HOSPITAL_BASED_OUTPATIENT_CLINIC_OR_DEPARTMENT_OTHER): Payer: 59 | Admitting: Oncology

## 2012-07-10 ENCOUNTER — Telehealth: Payer: Self-pay | Admitting: Oncology

## 2012-07-10 VITALS — BP 132/85 | HR 97 | Temp 97.1°F | Resp 18 | Ht 74.0 in | Wt 261.1 lb

## 2012-07-10 DIAGNOSIS — K1230 Oral mucositis (ulcerative), unspecified: Secondary | ICD-10-CM

## 2012-07-10 DIAGNOSIS — C029 Malignant neoplasm of tongue, unspecified: Secondary | ICD-10-CM

## 2012-07-10 DIAGNOSIS — C023 Malignant neoplasm of anterior two-thirds of tongue, part unspecified: Secondary | ICD-10-CM

## 2012-07-10 DIAGNOSIS — K121 Other forms of stomatitis: Secondary | ICD-10-CM

## 2012-07-10 LAB — COMPREHENSIVE METABOLIC PANEL (CC13)
AST: 11 U/L (ref 5–34)
Albumin: 3.5 g/dL (ref 3.5–5.0)
Alkaline Phosphatase: 83 U/L (ref 40–150)
BUN: 16.8 mg/dL (ref 7.0–26.0)
Creatinine: 1.1 mg/dL (ref 0.7–1.3)
Glucose: 118 mg/dl — ABNORMAL HIGH (ref 70–99)
Potassium: 3.8 mEq/L (ref 3.5–5.1)

## 2012-07-10 LAB — CBC WITH DIFFERENTIAL/PLATELET
Basophils Absolute: 0 10*3/uL (ref 0.0–0.1)
EOS%: 2.1 % (ref 0.0–7.0)
Eosinophils Absolute: 0.1 10*3/uL (ref 0.0–0.5)
HCT: 32.7 % — ABNORMAL LOW (ref 38.4–49.9)
HGB: 11.6 g/dL — ABNORMAL LOW (ref 13.0–17.1)
LYMPH%: 9 % — ABNORMAL LOW (ref 14.0–49.0)
MCH: 29.6 pg (ref 27.2–33.4)
MCV: 83.2 fL (ref 79.3–98.0)
MONO%: 26.3 % — ABNORMAL HIGH (ref 0.0–14.0)
NEUT#: 1.5 10*3/uL (ref 1.5–6.5)
NEUT%: 62.2 % (ref 39.0–75.0)
Platelets: 297 10*3/uL (ref 140–400)
RDW: 13.8 % (ref 11.0–14.6)

## 2012-07-10 MED ORDER — HYDROCODONE-ACETAMINOPHEN 7.5-325 MG/15ML PO SOLN: 10.0000 mL | ORAL | Status: DC | PRN

## 2012-07-10 NOTE — Telephone Encounter (Signed)
Gave pt appt for pt for july 2014 and MD

## 2012-07-10 NOTE — Progress Notes (Signed)
[facility]  [phone] [phone]   OFFICE PROGRESS NOTE   Cc:  [name], MD  DIAGNOSIS: recurrent tongue squamous cell carcinoma.   PAST THERAPY:  partial right hemiglossectomy on 08/22/2011 with pathology case number [id number] consistent with invasive squamous cell carcinoma measured 1.3 cm. Margin was not involved. He underwent right neck dissection on 09/06/2011 with pathology case number [id number] showing 1 the 24 lymph nodes in levels one 2 and 3 containing squamous cell carcinoma. He also has reresection of the right tongue which showed ulcerated squamous mucosa with underlying acute and chronic inflammation, granulation tissue formation and giant cell reaction without tumor seen. He was on observation until earlier  2014 he noticed and also in the right side of the tongue. On 03/23/2012, he underwent resection of the right tongue with pathology case number [id number] showed invasive well-differentiated squamous cell carcinoma, spanning 1.5 cm in greatest dimension. Margins were negative. There was no lymphovascular invasion. The tumor did invade into the underlying muscle. He underwent left neck dissection on 04/23/2012 with 2/34 positive nodes.   CURRENT THERAPY:  started adjuvant chemoradiation with q3 weeks cisplatin and daily radiation on 06/01/2012.   INTERVAL HISTORY: [name] 32 y.o. male returns for regular follow up with his wife.  He reported thick oral secretion.  He has been doing mouth rinse with salt/baking soda; diluted peroxide; and Robitussin every 3 hours while awake.  He still has secretion.  He has moderate mucositis pain.  He is on Fentanyl patch and takes Hycet elixir about 3x/day.  He denied constipation.  He depends 100% on PEG tube.  He does not eat by mouth. He reports doing his swallowing exercise.  He has moderate fatigue.  He is independent of light activities of daily living.  However, he spends most of  his awake time at rest.  He does not perform chores or exercises.  Patient denies fever, headache, visual changes, confusion, drenching night sweats, palpable lymph node swelling, nausea vomiting, jaundice, chest pain, palpitation, shortness of breath, dyspnea on exertion, productive cough, gum bleeding, epistaxis, hematemesis, hemoptysis, abdominal pain, abdominal swelling, early satiety, melena, hematochezia, hematuria, skin rash, spontaneous bleeding, joint swelling, joint pain, heat or cold intolerance, bowel bladder incontinence, back pain, focal motor weakness, paresthesia, depression.     Past Medical History  Diagnosis Date  . High triglycerides   . Gout   . Staph skin infection     Elbow- resolved.    Marland Kitchen PONV (postoperative nausea and vomiting)   . Depression     "patient quit smoking was put on wellbutrin to assist"  . Head and neck cancer     tongue cancer  . Stones in the urinary tract   . GERD (gastroesophageal reflux disease)     occ    Past Surgical History  Procedure Laterality Date  . Mouth surgery      biopsy- (08/06/2011 Dr. Barbette Merino), also had tissue graft (oral) 2010  . Glossectomy    . Glossectomy  08/22/2011    Procedure: GLOSSECTOMY;  Surgeon: Serena Colonel, MD;  Location: [facility] OR;  Service: ENT;  Laterality: Right;  WITH FROZEN SECTION  . Glossectomy  09/06/2011    Procedure: GLOSSECTOMY;  Surgeon: Serena Colonel, MD;  Location: [facility] OR;  Service: ENT;  Laterality: Right;  Right Tongue Re-Excision  . Radical neck dissection  09/06/2011    Procedure: RADICAL NECK DISSECTION;  Surgeon: Serena Colonel, MD;  Location: [facility] OR;  Service: ENT;  Laterality: Right;  Right Modified Neck Dissection   . Hemiglossectomy N/A 03/23/2012    Procedure: BIOPSY OF TONGUE WITH FROZEN SECTION AND RIGHT HEMIGLOSSECTOMY;  Surgeon: Serena Colonel, MD;  Location: [facility] OR;  Service: ENT;  Laterality: N/A;  . Tracheostomy tube placement N/A 03/23/2012    Procedure: TRACHEOSTOMY;  Surgeon: Serena Colonel, MD;   Location: [facility] OR;  Service: ENT;  Laterality: N/A;  . Tongue biopsy    . Tracheostomy closure      14  . Gastrostomy w/ feeding tube  2/14  . Radical neck dissection Left 04/23/2012    Procedure: LEFT MODIFIED  NECK DISSECTION;  Surgeon: Serena Colonel, MD;  Location: [facility] OR;  Service: ENT;  Laterality: Left;  Left Supra Omo Hyoid Neck Dissection     Current Outpatient Prescriptions  Medication Sig Dispense Refill  . Alum & Mag Hydroxide-Simeth (MAGIC MOUTHWASH) SOLN Take 15 mLs by mouth 4 (four) times daily.  473 mL  2  . Alum & Mag Hydroxide-Simeth (MAGIC MOUTHWASH) SOLN Take by mouth.      Marland Kitchen buPROPion (WELLBUTRIN SR) 150 MG 12 hr tablet Take 150 mg by mouth 2 (two) times daily.       . chlorhexidine (PERIDEX) 0.12 % solution Rinse with 15 mls twice daily for 30 seconds. Use after breakfast and at bedtime. Spit out excess. Do not swallow.  480 mL  prn  . chlorproMAZINE (THORAZINE) 25 MG tablet Take 1 tablet (25 mg total) by mouth 3 (three) times daily.  25 tablet  1  . fentaNYL (DURAGESIC) 50 MCG/HR Place 1 patch (50 mcg total) onto the skin every 3 (three) days.  5 patch  0  . fluconazole (DIFLUCAN) 100 MG tablet Take 1 tablet (100 mg total) by mouth daily.  8 tablet  0  . hyaluronate sodium (RADIAPLEXRX) GEL Apply topically 2 (two) times daily.      Marland Kitchen HYDROcodone-acetaminophen (HYCET) 7.5-325 mg/15 ml solution Take 15 mLs by mouth every 4 (four) hours as needed for pain.  473 mL  0  . HYDROcodone-acetaminophen (HYCET) 7.5-325 mg/15 ml solution Take 10-15 mLs by mouth every 4 (four) hours as needed.  480 mL  1  . ibuprofen (ADVIL,MOTRIN) 200 MG tablet Take 400 mg by mouth every 6 (six) hours as needed for pain.      Marland Kitchen lidocaine (XYLOCAINE) 2 % solution Take 15 mLs by mouth as needed for pain. 1:1 mixture with Robitussin to thin out thick mucus.  500 mL  2  . LORazepam (ATIVAN) 0.5 MG tablet Take 1 tablet (0.5 mg total) by mouth every 6 (six) hours as needed (anticipatory nausea or vomiting).  30  tablet  1  . ondansetron (ZOFRAN) 8 MG tablet Take 1 tablet (8 mg total) by mouth every 12 (twelve) hours as needed. Take two times a day as needed for nausea or vomiting starting on the third day after chemotherapy.  30 tablet  3  . prochlorperazine (COMPAZINE) 10 MG tablet Take 1 tablet (10 mg total) by mouth every 6 (six) hours as needed (Nausea or vomiting).  30 tablet  1  . prochlorperazine (COMPAZINE) 25 MG suppository Place 1 suppository (25 mg total) rectally every 12 (twelve) hours as needed for nausea.  12 suppository  3  . simvastatin (ZOCOR) 10 MG tablet Take 10 mg by mouth every evening.       . sodium fluoride (FLUORISHIELD) 1.1 % GEL dental gel Instill one drop of gel per tooth space of fluoride tray. Place  over teeth for 5 minutes. Remove. Spit out excess. Repeat nightly.  120 mL  prn   No current facility-administered medications for this visit.    ALLERGIES:  has No Known Allergies.  REVIEW OF SYSTEMS:  The rest of the 14-point review of system was negative.   Filed Vitals:   07/10/12 1351  BP: 132/85  Pulse: 97  Temp: 97.1 F (36.2 C)  Resp: 18   Wt Readings from Last 3 Encounters:  07/10/12 261 lb 1.6 oz (118.434 kg)  07/07/12 263 lb (119.296 kg)  07/02/12 263 lb (119.296 kg)   ECOG Performance status: 1-2  PHYSICAL EXAMINATION:   General: well-nourished man, in no acute distress. Eyes: no scleral icterus. ENT: erythema with thick oral secretion.  There was no residual oral thrush. Neck was without thyromegaly. There was dry desquamation Lymphatics: Negative for supraclavicular or axillary adenopathy. His bilateral neck dissection scars have healed up nicely. I did not feel any cervical adenopathy.  Respiratory: lungs were clear bilaterally without wheezing or crackles. Cardiovascular: Regular rate and rhythm, S1/S2, without murmur, rub or gallop. There was no pedal edema. GI: abdomen was soft, flat, nontender, nondistended, without organomegaly. PEG tube in place,  dry, clean, intact without pain. Muscoloskeletal: no spinal tenderness of palpation of vertebral spine. Skin exam was without echymosis, petichae. Neuro exam was nonfocal. Patient was able to get on and off exam table without assistance. Gait was normal. Patient was alert and oriented. Attention was good. Language was appropriate. Mood was normal without depression. Speech was not pressured. Thought content was not tangential.      LABORATORY/RADIOLOGY DATA:  Lab Results  Component Value Date   WBC 2.4* 07/10/2012   HGB 11.6* 07/10/2012   HCT 32.7* 07/10/2012   PLT 297 07/10/2012   GLUCOSE 118* 07/10/2012   ALKPHOS 83 07/10/2012   ALT 16 07/10/2012   AST 11 07/10/2012   NA 137 07/10/2012   K 3.8 07/10/2012   CL 100 07/10/2012   CREATININE 1.1 07/10/2012   BUN 16.8 07/10/2012   CO2 27 07/10/2012   INR 0.97 04/15/2012     ASSESSMENT AND PLAN:   1.  Recurrent head/neck cancer. - He is s/p 2nd cycle of chemo 3 weeks ago.  He has grade 2-3 mucositis, grade 1 skin rash, grade 1-2 fatigue, grade 1 leukepnia, grade 1 elevated Cr.  I recommended to proceed with the 3rd and last dose of chemo Cisplatin with 50% dose reduction.    2.  Nausea/vomiting prophy:  He has Compazine/Zofran/Ativan prn.  3.  Mucositis/xerostomia:  I advised him to continue mouth rinse with salt/baking soda; diluted H2O2; Robitussin.  He has been doing all these; however, he still has thick oral secretion.  I recommended Yankauer suction but he refused.   4.  Mucositis pain:  He has Fentanyl 50 mcg patch and Hycet.  He only needs Hycet about 3x/day.  In the future, if he needs it more frequently, we may consider increasing the dose of Fentanyl.  I refilled Hycet today.  5.  Follow up:  In about 3 weeks to ensure that he is recovering.    6.  Social:  He requested extension letter explaining his absence from work.  He will need time to recover post treatment for about 1-2 months.  I wrote a letter for this extension for until  09/07/2012 with possible additional extension if he has not recovered by then.     The length of time of the face-to-face encounter was  15 minutes. More than 50% of time was spent counseling and coordination of care.

## 2012-07-13 ENCOUNTER — Ambulatory Visit (HOSPITAL_BASED_OUTPATIENT_CLINIC_OR_DEPARTMENT_OTHER): Payer: 59

## 2012-07-13 ENCOUNTER — Other Ambulatory Visit: Payer: Self-pay | Admitting: Oncology

## 2012-07-13 ENCOUNTER — Ambulatory Visit: Payer: 59 | Admitting: Nutrition

## 2012-07-13 ENCOUNTER — Ambulatory Visit
Admission: RE | Admit: 2012-07-13 | Discharge: 2012-07-13 | Disposition: A | Discharge status: Home / Self Care | Payer: 59 | Source: Ambulatory Visit | Attending: Radiation Oncology | Admitting: Radiation Oncology

## 2012-07-13 ENCOUNTER — Other Ambulatory Visit: Payer: Self-pay

## 2012-07-13 VITALS — BP 131/93 | HR 73 | Temp 98.5°F | Resp 20

## 2012-07-13 DIAGNOSIS — K1230 Oral mucositis (ulcerative), unspecified: Secondary | ICD-10-CM

## 2012-07-13 DIAGNOSIS — C023 Malignant neoplasm of anterior two-thirds of tongue, part unspecified: Secondary | ICD-10-CM

## 2012-07-13 DIAGNOSIS — Z510 Encounter for antineoplastic radiation therapy: Secondary | ICD-10-CM | POA: Insufficient documentation

## 2012-07-13 DIAGNOSIS — Z5111 Encounter for antineoplastic chemotherapy: Secondary | ICD-10-CM

## 2012-07-13 DIAGNOSIS — C029 Malignant neoplasm of tongue, unspecified: Secondary | ICD-10-CM

## 2012-07-13 MED ORDER — DEXAMETHASONE SODIUM PHOSPHATE 20 MG/5ML IJ SOLN
12.0000 mg | Freq: Once | INTRAMUSCULAR | Status: AC
  Administered 2012-07-13: 20 mg via INTRAVENOUS

## 2012-07-13 MED ORDER — POTASSIUM CHLORIDE 2 MEQ/ML IV SOLN
Freq: Once | INTRAVENOUS | Status: AC
  Administered 2012-07-13: 10:00:00 via INTRAVENOUS
  Filled 2012-07-13: qty 10

## 2012-07-13 MED ORDER — HYDROCODONE-ACETAMINOPHEN 7.5-325 MG/15ML PO SOLN: 15.0000 mL | Freq: Once | ORAL | Status: DC

## 2012-07-13 MED ORDER — HYDROCODONE-ACETAMINOPHEN 7.5-325 MG/15ML PO SOLN
15.0000 mL | Freq: Once | ORAL | Status: AC
  Administered 2012-07-13: 15 mL
  Filled 2012-07-13: qty 15

## 2012-07-13 MED ORDER — SODIUM CHLORIDE 0.9 % IV SOLN
150.0000 mg | Freq: Once | INTRAVENOUS | Status: AC
  Administered 2012-07-13: 150 mg via INTRAVENOUS
  Filled 2012-07-13: qty 5

## 2012-07-13 MED ORDER — PALONOSETRON HCL INJECTION 0.25 MG/5ML
0.2500 mg | Freq: Once | INTRAVENOUS | Status: AC
  Administered 2012-07-13: 0.25 mg via INTRAVENOUS

## 2012-07-13 MED ORDER — SODIUM CHLORIDE 0.9 % IV SOLN
50.0000 mg/m2 | Freq: Once | INTRAVENOUS | Status: AC
  Administered 2012-07-13: 129 mg via INTRAVENOUS
  Filled 2012-07-13: qty 129

## 2012-07-13 MED ORDER — FENTANYL 75 MCG/HR TD PT72: 1.0000 | MEDICATED_PATCH | TRANSDERMAL | Status: DC

## 2012-07-13 NOTE — Progress Notes (Signed)
Patient responds with a "thumbs up" when asked how he is doing. He is tolerating 6 cans of Osmolite 1.5 bolus tube feeding utilizing one can every 3 hours as tolerated. His weight was documented as 261.1 pounds on June 13, down slightly from 263 pounds June 5. Glucose was noted to be 118. Patient is not eating or drinking by mouth at this time. Patient continues to tolerate free water flushes. Patient was adding additional protein powder of 30-60 g daily. Patient's wife questioning how to transition to oral intake.  Nutrition diagnosis: Food and nutrition related knowledge deficit and inadequate oral intake both continue.  Revised estimated needs. 2300-2500 calories, 115-130 g protein, 2.3 L fluid.  Current enteral nutrition providing: 2130 calories, 89.4 g protein, 2166 mL free water.  Intervention: Patient will continue one can of Osmolite 1.5 with 90 mL of free water before and after each can of bolus feeding. Patient does not want to increase the amount of tube feeding at bolus feeds at this time, therefore, 6 cans will be his goal. Additional sample formula was provided to patient. Patient and wife educated on strategies for increasing liquids and eventually soft solid foods. Patient was educated not to decrease tube feeding until oral intake had improved. Teach back method used.  Monitoring, evaluation, goals: Patient will continue to tolerate bolus tube feedings to promote weight maintenance and healing. He is receiving greater than 90% of estimated caloric needs.  Next visit: Monday, July 7, after speech therapy appointment.

## 2012-07-13 NOTE — Progress Notes (Signed)
CBC noted from 07/10/12, per Dr Gaylyn Rong,  OK to proceed with chemo. No need to recheck CBC.

## 2012-07-13 NOTE — Patient Instructions (Addendum)
[facility] Discharge Instructions for Patients Receiving Chemotherapy  Today you received the following chemotherapy agents Cisplatin.  To help prevent nausea and vomiting after your treatment, we encourage you to take your nausea medication as directed.    If you develop nausea and vomiting that is not controlled by your nausea medication, call the clinic.   BELOW ARE SYMPTOMS THAT SHOULD BE REPORTED IMMEDIATELY:  *FEVER GREATER THAN 100.5 F  *CHILLS WITH OR WITHOUT FEVER  NAUSEA AND VOMITING THAT IS NOT CONTROLLED WITH YOUR NAUSEA MEDICATION  *UNUSUAL SHORTNESS OF BREATH  *UNUSUAL BRUISING OR BLEEDING  TENDERNESS IN MOUTH AND THROAT WITH OR WITHOUT PRESENCE OF ULCERS  *URINARY PROBLEMS  *BOWEL PROBLEMS  UNUSUAL RASH Items with * indicate a potential emergency and should be followed up as soon as possible.  Feel free to call the clinic you have any questions or concerns. The clinic phone number is [phone].    

## 2012-07-14 ENCOUNTER — Ambulatory Visit: Payer: 59 | Admitting: Physical Therapy

## 2012-07-14 ENCOUNTER — Encounter: Payer: Self-pay | Admitting: Radiation Oncology

## 2012-07-14 ENCOUNTER — Ambulatory Visit
Admission: RE | Admit: 2012-07-14 | Discharge: 2012-07-14 | Disposition: A | Discharge status: Home / Self Care | Payer: 59 | Source: Ambulatory Visit | Attending: Radiation Oncology | Admitting: Radiation Oncology

## 2012-07-14 VITALS — BP 156/85 | HR 106 | Temp 98.2°F | Resp 20 | Wt 262.6 lb

## 2012-07-14 DIAGNOSIS — C023 Malignant neoplasm of anterior two-thirds of tongue, part unspecified: Secondary | ICD-10-CM | POA: Insufficient documentation

## 2012-07-14 MED ORDER — FLUCONAZOLE 100 MG PO TABS: 100.0000 mg | ORAL_TABLET | Freq: Every day | ORAL | Status: DC

## 2012-07-14 MED ORDER — RADIAPLEXRX EX GEL
Freq: Once | CUTANEOUS | Status: AC
  Administered 2012-07-14: 10:00:00 via TOPICAL

## 2012-07-14 MED FILL — Hydrocodone-Acetaminophen Soln 7.5-325 MG/15ML: ORAL | Qty: 15 | Status: AC

## 2012-07-14 NOTE — Progress Notes (Addendum)
Pt c/o mouth/tongue pain 5/10. He is taking Hycet every 4 hours w/good relief. Pt wearing Duragesic Patch increased to 75 mcg. He is using MMW. Pt's wife states he may have thrush again; she noticed foul odor to his breath. Pt completed  Diflucan 3 weeks ago. Pt has whitish appearance to roof of mouth. Pt taking in 6 cans nutritional supplement w/o difficulty, sips water PO. He denies difficulty w/coughing out his thick saliva. Applying Radiaplex to neck/face tx area, applying Silvadene to 2 small areas desquamation on back of neck. Pt has hyperpigmentation, dry desquamation of skin. Gave pt another tube of Radiaplex.   Pt fatigued. Pt completed today. Pt to be seen for FU w/Dr Roselind Messier next Monday, has card.

## 2012-07-14 NOTE — Progress Notes (Signed)
[facility]    Radiation Oncology [address] [place]     Maryln Gottron, M.D. [place], [place] [address]               Billie Lade, M.D., Ph.D. Phone: [phone]      Molli Hazard A. Kathrynn Running, M.D. Fax: [phone]      Radene Gunning, M.D., Ph.D.         Lurline Hare, M.D.         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  Current Dose: 60 Gy  Current Fraction: 30  Planned Dose: 60 Gy  Narrative: [name] was seen today for weekly treatment management. The chart was checked and MVCT  were reviewed. He does have a lot of soreness in his mouth at this time. His narcotic medications seem to be helping this reasonably well. He has noticed unusual taste in his mouth and his concern he may be getting another yeast infection. It also started having some hiccups today but does have medication for this issue.  Review of patient's allergies indicates no known allergies. Current Outpatient Prescriptions  Medication Sig Dispense Refill  . Alum & Mag Hydroxide-Simeth (MAGIC MOUTHWASH) SOLN Take 15 mLs by mouth 4 (four) times daily.  473 mL  2  . Alum & Mag Hydroxide-Simeth (MAGIC MOUTHWASH) SOLN Take by mouth.      Marland Kitchen buPROPion (WELLBUTRIN SR) 150 MG 12 hr tablet Take 150 mg by mouth 2 (two) times daily.       . chlorhexidine (PERIDEX) 0.12 % solution Rinse with 15 mls twice daily for 30 seconds. Use after breakfast and at bedtime. Spit out excess. Do not swallow.  480 mL  prn  . chlorproMAZINE (THORAZINE) 25 MG tablet Take 1 tablet (25 mg total) by mouth 3 (three) times daily.  25 tablet  1  . fentaNYL (DURAGESIC - DOSED MCG/HR) 75 MCG/HR Place 1 patch (75 mcg total) onto the skin every  3 (three) days.  5 patch  0  . fluconazole (DIFLUCAN) 100 MG tablet Take 1 tablet (100 mg total) by mouth daily.  8 tablet  0  . hyaluronate sodium (RADIAPLEXRX) GEL Apply topically 2 (two) times daily.      Marland Kitchen HYDROcodone-acetaminophen (HYCET) 7.5-325 mg/15 ml solution Take 15 mLs by mouth every 4 (four) hours as needed for pain.  473 mL  0  . HYDROcodone-acetaminophen (HYCET) 7.5-325 mg/15 ml solution Take 10-15 mLs by mouth every 4 (four) hours as needed.  480 mL  1  . ibuprofen (ADVIL,MOTRIN) 200 MG tablet Take 400 mg by mouth every 6 (six) hours as needed for pain.      Marland Kitchen lidocaine (XYLOCAINE) 2 % solution Take 15 mLs by mouth as needed for pain. 1:1 mixture with Robitussin to thin out thick mucus.  500 mL  2  . LORazepam (ATIVAN) 0.5 MG tablet Take 1 tablet (0.5 mg total) by mouth every 6 (six) hours as needed (anticipatory nausea or vomiting).  30 tablet  1  . ondansetron (ZOFRAN) 8 MG tablet Take 1 tablet (8 mg total) by mouth every 12 (twelve)  hours as needed. Take two times a day as needed for nausea or vomiting starting on the third day after chemotherapy.  30 tablet  3  . prochlorperazine (COMPAZINE) 10 MG tablet Take 1 tablet (10 mg total) by mouth every 6 (six) hours as needed (Nausea or vomiting).  30 tablet  1  . prochlorperazine (COMPAZINE) 25 MG suppository Place 1 suppository (25 mg total) rectally every 12 (twelve) hours as needed for nausea.  12 suppository  3  . simvastatin (ZOCOR) 10 MG tablet Take 10 mg by mouth every evening.       . sodium fluoride (FLUORISHIELD) 1.1 % GEL dental gel Instill one drop of gel per tooth space of fluoride tray. Place over teeth for 5 minutes. Remove. Spit out excess. Repeat nightly.  120 mL  prn   No current facility-administered medications for this encounter.   Labs:  Lab Results  Component Value Date   WBC 2.4* 07/10/2012   HGB 11.6* 07/10/2012   HCT 32.7* 07/10/2012   MCV 83.2 07/10/2012   PLT 297 07/10/2012   Lab Results  Component  Value Date   CREATININE 1.1 07/10/2012   BUN 16.8 07/10/2012   NA 137 07/10/2012   K 3.8 07/10/2012   CL 100 07/10/2012   CO2 27 07/10/2012   Lab Results  Component Value Date   ALT 16 07/10/2012   AST 11 07/10/2012   BILITOT 0.35 07/10/2012    Physical Examination:  weight is 262 lb 9.6 oz (119.115 kg). His oral temperature is 98.2 F (36.8 C). His blood pressure is 156/85 and his pulse is 106. His respiration is 20.    Wt Readings from Last 3 Encounters:  07/14/12 262 lb 9.6 oz (119.115 kg)  07/10/12 261 lb 1.6 oz (118.434 kg)  07/07/12 263 lb (119.296 kg)    The neck area shows erythema and dry desquamation without any active moist desquamation. Exam of the oral cavity reveals a thick tenacious sputum. There is a white coating to the tongue consistent with the mucositis or possibly a candidiasis infection. he does have some ulcers along the buccal mucosal area. Lungs - Normal respiratory effort, chest expands symmetrically. Lungs are clear to auscultation, no crackles or wheezes.  Heart has regular rhythm and rate  Abdomen is soft and non tender with normal bowel sounds  Assessment:  Patient is having significant side effects with this treatment but reasonably manageable with medication. Patient will be placed back on Diflucan.  Plan: He will return for followup in one week.

## 2012-07-15 ENCOUNTER — Encounter: Payer: Self-pay | Admitting: Radiation Oncology

## 2012-07-15 ENCOUNTER — Ambulatory Visit: Payer: 59

## 2012-07-15 NOTE — Progress Notes (Signed)
   Department of Radiation Oncology  Phone:  [phone] Fax:        [phone]  Intensity modulated radiation therapy device note  On 06/02/2012 the patient began his radiation therapy directed at the head and neck area. Patient had set up of his intensity modulated radiation therapy device. The patient will be treated with several sinogram segments using helical IMRT. This constitutes 1 IMRT device.  -----------------------------------  Billie Lade, PhD, MD

## 2012-07-15 NOTE — Progress Notes (Signed)
  Radiation Oncology         [phone] ________________________________  Name: [name] MRN: [id number]  Date: 07/15/2012  DOB: 26-Jul-1980  End of Treatment Note  Diagnosis:   Recurrent  oral tongue cancer     Indication for treatment:  Postop, a definitive treatment along with radiosensitizing chemotherapy       Radiation treatment dates:   06/02/2012 through 07/14/2012  Site/dose:   Oral tongue and bilateral neck, 60 gray in 30 fractions  Beams/energy:   The patient was treated with helical intensity modulated radiation therapy, 6 MV photons  Narrative: The patient did have significant side effects during his treatment as expected. He developed significant stomatitis which interfered with eating.. The patient did have a PEG placed prior to starting his radiation therapy which helped with nutrition and hydration issues. He did require narcotic pain medication during much of his treatment in light of his pain level. The patient did develop a candidate infection which responded to Diflucan. He did develop moist desquamation in the neck area which responded well to Silvadene.  Plan: The patient has completed radiation treatment. The patient will return to radiation oncology clinic for routine followup in one week. I advised them to call or return sooner if they have any questions or concerns related to their recovery or treatment. He will be followed closely until he has resolution of much of his symptoms.  -----------------------------------  Billie Lade, PhD, MD

## 2012-07-16 ENCOUNTER — Ambulatory Visit: Payer: 59

## 2012-07-17 ENCOUNTER — Ambulatory Visit: Payer: 59

## 2012-07-20 ENCOUNTER — Ambulatory Visit
Admission: RE | Admit: 2012-07-20 | Discharge: 2012-07-20 | Disposition: A | Discharge status: Home / Self Care | Payer: 59 | Source: Ambulatory Visit | Attending: Radiation Oncology | Admitting: Radiation Oncology

## 2012-07-20 ENCOUNTER — Ambulatory Visit: Payer: 59

## 2012-07-20 ENCOUNTER — Encounter: Payer: Self-pay | Admitting: Radiation Oncology

## 2012-07-20 VITALS — BP 125/80 | HR 95 | Temp 98.5°F | Resp 18 | Wt 251.8 lb

## 2012-07-20 DIAGNOSIS — C023 Malignant neoplasm of anterior two-thirds of tongue, part unspecified: Secondary | ICD-10-CM

## 2012-07-20 NOTE — Progress Notes (Signed)
Radiation Oncology         [phone]) [phone] ________________________________  Name: [name] MRN: [id number]  Date: 07/20/2012  DOB: 1980-10-20  Follow-Up Visit Note  CC: [name], MD  Karleen Hampshire, MD  Diagnosis:   Recurrent oral tongue cancer  Interval Since Last Radiation:  6 days  Narrative:  The patient returns today for close followup. He is about to complete his Diflucan prescription. Patient is unable to swallow any liquids or solids at this time and does practice his swallowing. He continues on hycet for pain control.  He is taking in 6 cans of nutritional supplements through his feeding. He has lost some weight over the past week and have recommended he try to increase 8 cans per day.                        ALLERGIES:  has No Known Allergies.  Meds: Current Outpatient Prescriptions  Medication Sig Dispense Refill  . Alum & Mag Hydroxide-Simeth (MAGIC MOUTHWASH) SOLN Take 15 mLs by mouth 4 (four) times daily.  473 mL  2  . Alum & Mag Hydroxide-Simeth (MAGIC MOUTHWASH) SOLN Take by mouth.      Marland Kitchen buPROPion (WELLBUTRIN SR) 150 MG 12 hr tablet Take 150 mg by mouth 2 (two) times daily.       . chlorhexidine (PERIDEX) 0.12 % solution Rinse with 15 mls twice daily for 30 seconds. Use after breakfast and at bedtime. Spit out excess. Do not swallow.  480 mL  prn  . chlorproMAZINE (THORAZINE) 25 MG tablet Take 1 tablet (25 mg total) by mouth 3 (three) times daily.  25 tablet  1  . fentaNYL (DURAGESIC - DOSED MCG/HR) 75 MCG/HR Place 1 patch (75 mcg total) onto the skin every 3 (three) days.  5 patch  0  . fluconazole (DIFLUCAN) 100 MG tablet Take 1 tablet (100 mg total) by mouth daily.  8 tablet  0  . hyaluronate sodium (RADIAPLEXRX) GEL Apply topically 2 (two) times daily.      Marland Kitchen HYDROcodone-acetaminophen (HYCET) 7.5-325 mg/15 ml solution Take 15 mLs by mouth every 4 (four) hours as needed for pain.  473 mL  0  . HYDROcodone-acetaminophen (HYCET) 7.5-325 mg/15 ml solution  Take 10-15 mLs by mouth every 4 (four) hours as needed.  480 mL  1  . ibuprofen (ADVIL,MOTRIN) 200 MG tablet Take 400 mg by mouth every 6 (six) hours as needed for pain.      Marland Kitchen lidocaine (XYLOCAINE) 2 % solution Take 15 mLs by mouth as needed for pain. 1:1 mixture with Robitussin to thin out thick mucus.  500 mL  2  . LORazepam (ATIVAN) 0.5 MG tablet Take 1 tablet (0.5 mg total) by mouth every 6 (six) hours as needed (anticipatory nausea or vomiting).  30 tablet  1  . ondansetron (ZOFRAN) 8 MG tablet Take 1 tablet (8 mg total) by mouth every 12 (twelve) hours as needed. Take two times a day as needed for nausea or vomiting starting on the third day after chemotherapy.  30 tablet  3  . prochlorperazine (COMPAZINE) 10 MG tablet Take 1 tablet (10 mg total) by mouth every 6 (six) hours as needed (Nausea or vomiting).  30 tablet  1  . prochlorperazine (COMPAZINE) 25 MG suppository Place 1 suppository (25 mg total) rectally every 12 (twelve) hours as needed for nausea.  12 suppository  3  . simvastatin (ZOCOR) 10 MG tablet Take 10 mg by mouth  every evening.       . sodium fluoride (FLUORISHIELD) 1.1 % GEL dental gel Instill one drop of gel per tooth space of fluoride tray. Place over teeth for 5 minutes. Remove. Spit out excess. Repeat nightly.  120 mL  prn   No current facility-administered medications for this encounter.    Physical Findings: The patient is in no acute distress. Patient is alert and oriented.  weight is 251 lb 12.8 oz (114.216 kg). His oral temperature is 98.5 F (36.9 C). His blood pressure is 125/80 and his pulse is 95. His respiration is 18. .  The lungs are clear. The heart has a regular rhythm and rate.  He has some small areas of moist desquamation in the neck.  He continues to use Silvadene. The oral cavity reveals  tenacious sputum. He continues have areas of ulceration along the buccal mucosal and oral tongue region.   There is no hemorrhagic mucositis noted.  Lab  Findings: Lab Results  Component Value Date   WBC 2.4* 07/10/2012   HGB 11.6* 07/10/2012   HCT 32.7* 07/10/2012   MCV 83.2 07/10/2012   PLT 297 07/10/2012    Radiographic Findings: No results found.  Impression:  The patient is slowly recovering from the effects of radiation.    Plan:  Close followup in 2 weeks  _____________________________________  -----------------------------------  Billie Lade, PhD, MD

## 2012-07-20 NOTE — Progress Notes (Signed)
Here for one week follow up. Eleven pound weight loss noted since 6/17. Reports taking in six cans of osmolite per day via PEG tube. Reports only sipping small amounts of water by mouth. Very thick rope like saliva noted despite using numerous different rinses. Reports he has one more diflucan to take for thrush. Reports thrush has improved. Reports that occasionally his sputum gags him making him almost vomit. Reports using hycet because of painful swallowing. Hyperpigmentation with moist desquamation noted to anterior and posterior neck. Reports using silvadene on affected area.

## 2012-07-21 ENCOUNTER — Ambulatory Visit: Payer: 59

## 2012-07-23 ENCOUNTER — Telehealth: Payer: Self-pay | Admitting: *Deleted

## 2012-07-23 NOTE — Telephone Encounter (Signed)
Spoke w/pt to FU w/pt's appointment earlier this week. Pt states "I am doing okay." Advised him to call if he has any needs at all prior to his FU on 08/03/12.

## 2012-07-23 NOTE — Telephone Encounter (Signed)
Attempted to call pt to inquire as to his status following his FU appointment w/Dr Roselind Messier on 07/20/12. No answer, no voice mail available.

## 2012-07-27 ENCOUNTER — Encounter: Payer: Self-pay | Admitting: Radiation Oncology

## 2012-07-29 ENCOUNTER — Encounter: Payer: Self-pay | Admitting: Radiation Oncology

## 2012-07-29 DIAGNOSIS — Z923 Personal history of irradiation: Secondary | ICD-10-CM | POA: Insufficient documentation

## 2012-07-30 ENCOUNTER — Ambulatory Visit (HOSPITAL_BASED_OUTPATIENT_CLINIC_OR_DEPARTMENT_OTHER): Payer: 59 | Admitting: Oncology

## 2012-07-30 ENCOUNTER — Telehealth: Payer: Self-pay | Admitting: Oncology

## 2012-07-30 ENCOUNTER — Other Ambulatory Visit (HOSPITAL_BASED_OUTPATIENT_CLINIC_OR_DEPARTMENT_OTHER): Payer: 59 | Admitting: Lab

## 2012-07-30 VITALS — BP 135/82 | HR 101 | Temp 98.0°F | Resp 18 | Ht 74.0 in | Wt 242.3 lb

## 2012-07-30 DIAGNOSIS — C029 Malignant neoplasm of tongue, unspecified: Secondary | ICD-10-CM

## 2012-07-30 DIAGNOSIS — K1230 Oral mucositis (ulcerative), unspecified: Secondary | ICD-10-CM

## 2012-07-30 LAB — CBC WITH DIFFERENTIAL/PLATELET
Basophils Absolute: 0 10*3/uL (ref 0.0–0.1)
EOS%: 1.5 % (ref 0.0–7.0)
HCT: 35 % — ABNORMAL LOW (ref 38.4–49.9)
HGB: 12.4 g/dL — ABNORMAL LOW (ref 13.0–17.1)
MCH: 29.7 pg (ref 27.2–33.4)
MONO#: 0.7 10*3/uL (ref 0.1–0.9)
NEUT%: 77.3 % — ABNORMAL HIGH (ref 39.0–75.0)
lymph#: 0.3 10*3/uL — ABNORMAL LOW (ref 0.9–3.3)

## 2012-07-30 LAB — BASIC METABOLIC PANEL (CC13)
BUN: 22.3 mg/dL (ref 7.0–26.0)
CO2: 32 mEq/L — ABNORMAL HIGH (ref 22–29)
Chloride: 94 mEq/L — ABNORMAL LOW (ref 98–109)
Creatinine: 1.2 mg/dL (ref 0.7–1.3)
Glucose: 104 mg/dl (ref 70–140)

## 2012-07-30 MED ORDER — FENTANYL 50 MCG/HR TD PT72: 1.0000 | MEDICATED_PATCH | TRANSDERMAL | Status: DC

## 2012-07-30 NOTE — Telephone Encounter (Signed)
gv pt appt schedule for November.  °

## 2012-08-01 ENCOUNTER — Encounter: Payer: Self-pay | Admitting: Oncology

## 2012-08-01 NOTE — Treatment Plan (Signed)
[facility] END OF TREATMENT   Name: VERDON FERRANTE Date: 08/01/2012 MRN: [id number] DOB: [birth year]   TREATMENT DATES:  From 06/01/12 to 07/13/12   REFERRING PHYSICIAN:  Serena Colonel, M.D.  DIAGNOSIS:  recurrent tongue squamous cell carcinoma.    STAGE AT START OF TREATMENT:  Not applicable due to local recurrence.    INTENT:Adjuvant   DRUGS OR REGIMENS GIVEN:  q3 wk cisplatin concurrently with daily XRT.    MAJOR TOXICITIES: grade 2 fatigue, grade 3 mucositis, grade 2 weight loss, grade 1 anemia/leukopenia   REASON TREATMENT STOPPED:  Finish of planned treatment.    PERFORMANCE STATUS AT END:  ECOG 1.    ONGOING PROBLEMS:  None.    FOLLOW UP PLANS:  Restaging PET scan and follow up with ENT and Rad Onc.

## 2012-08-01 NOTE — Progress Notes (Signed)
[facility]  Telephone:([phone] Fax:([phone]) [phone]   OFFICE PROGRESS NOTE   Cc:  [name], MD  DIAGNOSIS: recurrent tongue squamous cell carcinoma.   PAST THERAPY:  partial right hemiglossectomy on 08/22/2011 with pathology case number [id number] consistent with invasive squamous cell carcinoma measured 1.3 cm. Margin was not involved. He underwent right neck dissection on 09/06/2011 with pathology case number [id number] showing 1 the 24 lymph nodes in levels one 2 and 3 containing squamous cell carcinoma. He also has reresection of the right tongue which showed ulcerated squamous mucosa with underlying acute and chronic inflammation, granulation tissue formation and giant cell reaction without tumor seen. He was on observation until earlier  2014 he noticed and also in the right side of the tongue. On 03/23/2012, he underwent resection of the right tongue with pathology case number [id number] showed invasive well-differentiated squamous cell carcinoma, spanning 1.5 cm in greatest dimension. Margins were negative. There was no lymphovascular invasion. The tumor did invade into the underlying muscle. He underwent left neck dissection on 04/23/2012 with 2/34 positive nodes.   PAST THERAPY:  adjuvant chemoradiation with q3 weeks cisplatin and daily radiation on 05/2012 and 06/2012.  INTERVAL HISTORY: [name] 32 y.o. male returns for regular follow up by himself.  He reported feeling tired.  However, he is slowly recovering his strength since he has been off of therapy.  He takes much less nap than before.  He still has mucositis; however, the mouth pain is less than before.  He wanted to decrease the fentanyl dose.  He is not using much breakthrough pain med.  His skin has healed without any wet desquamation.  He denied any palpable node.  He still has no taste and has been taking feeding via PEG about 6-7 cans of Ensure a day.  He continues to have weight loss.   However, he thinks that the weight loss is stabilizing.  He plans to resume taking orally soon.  He denied fever, tinnitus, hearing loss, cough, SOB, chest pain, abd pain, bleeding symptoms, PEG tube pain.    Past Medical History  Diagnosis Date  . High triglycerides   . Gout   . Staph skin infection     Elbow- resolved.    Marland Kitchen PONV (postoperative nausea and vomiting)   . Depression     "patient quit smoking was put on wellbutrin to assist"  . Head and neck cancer     tongue cancer  . Stones in the urinary tract   . GERD (gastroesophageal reflux disease)     occ  . History of radiation therapy 06/02/12- 07/14/12    oral tongue and bilateral neck, 60 gray in 30 fx    Past Surgical History  Procedure Laterality Date  . Mouth surgery      biopsy- (08/06/2011 Dr. Barbette Merino), also had tissue graft (oral) 2010  . Glossectomy    . Glossectomy  08/22/2011    Procedure: GLOSSECTOMY;  Surgeon: Serena Colonel, MD;  Location: [facility] OR;  Service: ENT;  Laterality: Right;  WITH FROZEN SECTION  . Glossectomy  09/06/2011    Procedure: GLOSSECTOMY;  Surgeon: Serena Colonel, MD;  Location: [facility] OR;  Service: ENT;  Laterality: Right;  Right Tongue Re-Excision  . Radical neck dissection  09/06/2011    Procedure: RADICAL NECK DISSECTION;  Surgeon: Serena Colonel, MD;  Location: [facility] OR;  Service: ENT;  Laterality: Right;  Right Modified Neck Dissection   . Hemiglossectomy N/A 03/23/2012  Procedure: BIOPSY OF TONGUE WITH FROZEN SECTION AND RIGHT HEMIGLOSSECTOMY;  Surgeon: Serena Colonel, MD;  Location: [facility] OR;  Service: ENT;  Laterality: N/A;  . Tracheostomy tube placement N/A 03/23/2012    Procedure: TRACHEOSTOMY;  Surgeon: Serena Colonel, MD;  Location: [facility] OR;  Service: ENT;  Laterality: N/A;  . Tongue biopsy    . Tracheostomy closure      14  . Gastrostomy w/ feeding tube  2/14  . Radical neck dissection Left 04/23/2012    Procedure: LEFT MODIFIED  NECK DISSECTION;  Surgeon: Serena Colonel, MD;  Location: [facility] OR;  Service: ENT;   Laterality: Left;  Left Supra Omo Hyoid Neck Dissection     Current Outpatient Prescriptions  Medication Sig Dispense Refill  . Alum & Mag Hydroxide-Simeth (MAGIC MOUTHWASH) SOLN Take 15 mLs by mouth 4 (four) times daily.  473 mL  2  . Alum & Mag Hydroxide-Simeth (MAGIC MOUTHWASH) SOLN Take by mouth.      Marland Kitchen buPROPion (WELLBUTRIN SR) 150 MG 12 hr tablet Take 150 mg by mouth 2 (two) times daily.       . chlorhexidine (PERIDEX) 0.12 % solution Rinse with 15 mls twice daily for 30 seconds. Use after breakfast and at bedtime. Spit out excess. Do not swallow.  480 mL  prn  . chlorproMAZINE (THORAZINE) 25 MG tablet Take 1 tablet (25 mg total) by mouth 3 (three) times daily.  25 tablet  1  . fluconazole (DIFLUCAN) 100 MG tablet Take 1 tablet (100 mg total) by mouth daily.  8 tablet  0  . hyaluronate sodium (RADIAPLEXRX) GEL Apply topically 2 (two) times daily.      Marland Kitchen HYDROcodone-acetaminophen (HYCET) 7.5-325 mg/15 ml solution Take 15 mLs by mouth every 4 (four) hours as needed for pain.  473 mL  0  . HYDROcodone-acetaminophen (HYCET) 7.5-325 mg/15 ml solution Take 10-15 mLs by mouth every 4 (four) hours as needed.  480 mL  1  . ibuprofen (ADVIL,MOTRIN) 200 MG tablet Take 400 mg by mouth every 6 (six) hours as needed for pain.      Marland Kitchen lidocaine (XYLOCAINE) 2 % solution Take 15 mLs by mouth as needed for pain. 1:1 mixture with Robitussin to thin out thick mucus.  500 mL  2  . LORazepam (ATIVAN) 0.5 MG tablet Take 1 tablet (0.5 mg total) by mouth every 6 (six) hours as needed (anticipatory nausea or vomiting).  30 tablet  1  . ondansetron (ZOFRAN) 8 MG tablet Take 1 tablet (8 mg total) by mouth every 12 (twelve) hours as needed. Take two times a day as needed for nausea or vomiting starting on the third day after chemotherapy.  30 tablet  3  . prochlorperazine (COMPAZINE) 10 MG tablet Take 1 tablet (10 mg total) by mouth every 6 (six) hours as needed (Nausea or vomiting).  30 tablet  1  . prochlorperazine  (COMPAZINE) 25 MG suppository Place 1 suppository (25 mg total) rectally every 12 (twelve) hours as needed for nausea.  12 suppository  3  . simvastatin (ZOCOR) 10 MG tablet Take 10 mg by mouth every evening.       . sodium fluoride (FLUORISHIELD) 1.1 % GEL dental gel Instill one drop of gel per tooth space of fluoride tray. Place over teeth for 5 minutes. Remove. Spit out excess. Repeat nightly.  120 mL  prn  . fentaNYL (DURAGESIC - DOSED MCG/HR) 50 MCG/HR Place 1 patch (50 mcg total) onto the skin every 3 (three) days.  10  patch  0   No current facility-administered medications for this visit.    ALLERGIES:  has No Known Allergies.  REVIEW OF SYSTEMS:  The rest of the 14-point review of system was negative.   Filed Vitals:   07/30/12 0941  BP: 135/82  Pulse: 101  Temp: 98 F (36.7 C)  Resp: 18   Wt Readings from Last 3 Encounters:  07/30/12 242 lb 4.8 oz (109.907 kg)  07/20/12 251 lb 12.8 oz (114.216 kg)  07/14/12 262 lb 9.6 oz (119.115 kg)   ECOG Performance status: 1  PHYSICAL EXAMINATION:   General: well-nourished man, in no acute distress. Eyes: no scleral icterus. ENT: erythema with thick oral secretion.  There was no residual oral thrush. Neck was without thyromegaly. There was dry desquamation Lymphatics: Negative for supraclavicular or axillary adenopathy. His bilateral neck dissection scars have healed up nicely. I did not feel any cervical adenopathy.  Respiratory: lungs were clear bilaterally without wheezing or crackles. Cardiovascular: Regular rate and rhythm, S1/S2, without murmur, rub or gallop. There was no pedal edema. GI: abdomen was soft, flat, nontender, nondistended, without organomegaly. PEG tube in place, dry, clean, intact without pain. Muscoloskeletal: no spinal tenderness of palpation of vertebral spine. Skin exam was without echymosis, petichae. Neuro exam was nonfocal. Patient was able to get on and off exam table without assistance. Gait was normal. Patient  was alert and oriented. Attention was good. Language was appropriate. Mood was normal without depression. Speech was not pressured. Thought content was not tangential.      LABORATORY/RADIOLOGY DATA:  Lab Results  Component Value Date   WBC 5.0 07/30/2012   HGB 12.4* 07/30/2012   HCT 35.0* 07/30/2012   PLT 297 07/30/2012   GLUCOSE 104 07/30/2012   ALKPHOS 83 07/10/2012   ALT 16 07/10/2012   AST 11 07/10/2012   NA 137 07/30/2012   K 4.3 07/30/2012   CL 100 07/10/2012   CREATININE 1.2 07/30/2012   BUN 22.3 07/30/2012   CO2 32* 07/30/2012   INR 0.97 04/15/2012     ASSESSMENT AND PLAN:   1.  Recurrent head/neck cancer. - He has finished adjuvant chemoradiation.   - I advised him to follow up with ENT and Rad Onc since I am leaving the practice.  He should get a PET scan about 3-4 months from the finish of radiation which can be ordered by Rad Onc or ENT.   2.  Mucositis pain:  He wanted to decreased Fentanyl from 75 to 50 mcg patch.  He still has Hycet for breakthrough pain prn.  We plan to taper and d/c Fentanyl patch and Hycet over the next few months.   3.  Moderate calorie/protein malnutrition:  I advised him to continue PEG tube feeding.  However, over the next few weeks, he should also resume oral intake.   4.  Follow up:  With ENT and Rad Onc.    I informed Mr. Chhim that I am leaving the practice. If he needs Medical Oncology follow up, then Rad Onc and ENT can refer him back to [place].    The length of time of the face-to-face encounter was 15 minutes. More than 50% of time was spent counseling and coordination of care.

## 2012-08-03 ENCOUNTER — Ambulatory Visit: Payer: 59 | Attending: Radiation Oncology

## 2012-08-03 ENCOUNTER — Ambulatory Visit
Admission: RE | Admit: 2012-08-03 | Discharge: 2012-08-03 | Disposition: A | Discharge status: Home / Self Care | Payer: 59 | Source: Ambulatory Visit | Attending: Radiation Oncology | Admitting: Radiation Oncology

## 2012-08-03 ENCOUNTER — Ambulatory Visit: Payer: 59 | Admitting: Nutrition

## 2012-08-03 ENCOUNTER — Ambulatory Visit: Payer: 59

## 2012-08-03 ENCOUNTER — Encounter: Payer: Self-pay | Admitting: Radiation Oncology

## 2012-08-03 VITALS — BP 131/76 | HR 103 | Resp 16 | Wt 243.6 lb

## 2012-08-03 DIAGNOSIS — C023 Malignant neoplasm of anterior two-thirds of tongue, part unspecified: Secondary | ICD-10-CM

## 2012-08-03 DIAGNOSIS — R1310 Dysphagia, unspecified: Secondary | ICD-10-CM | POA: Insufficient documentation

## 2012-08-03 DIAGNOSIS — IMO0001 Reserved for inherently not codable concepts without codable children: Secondary | ICD-10-CM | POA: Insufficient documentation

## 2012-08-03 NOTE — Progress Notes (Signed)
Reports painful and difficulty swallowing continues. Reports this pain 2 on a scale of 0-10. Patient only using Fentanyl 50 mcg patch to control pain. Reports nausea has resolved. Reports thick ropey saliva continues. Reports thrush has resolved. Reports taking sips of water by mouth. Gain two pounds since last visit. Taking in six cans of ensure per day. Reports energy level is gradually improving. Skin of anterior and posterior neck with only slight hyperpigmentation now.

## 2012-08-03 NOTE — Progress Notes (Signed)
Nutrition Note  Pt and wife met with me in the office for follow-up. Pt is very concerned that he is still losing weight very quickly. He reports that his weight a week ago was 241 lbs (down from 261 lbs on 6/13). He was using 6 cans of osmolite 1.5 per day, but after her ran out of what what provided to him at [facility], he switched to 6 cans of ensure plus per day. He is able to drink small amounts of water by mouth, but still has sores in his mouth which keep him from being able to tolerate solid food. He is flushing his tube with 90 ml of water before and after each feeding and is consuming 4-16 oz bottles of water per day total via tube. Pt reported that he does not plan to use home health.   Nutrition Diagnosis: Inadequate oral intake related to tongue cancer as evidenced by continued weight loss, not met  Revised estimated needs: Kcal: 2700-3000 Protein: 140-150 g Fluid: 2.7-3 L  Intervention: - Pt asked for more osmolite 1.5, but there was none in stock at [facility]. Pt wanted to continue using ensure plus instead of switching to osmolite 1.2 at a higher number of cans.  - Pt to increase tube feeding regimen slowly as tolerated to 1.25 cans 6 times per day (every 3 hours)  for a goal of 8 cans per day which will provide 2800 kcal and 104 grams of protein to slow weight loss. Pt to continue flushing tube before and after feedings with 90 ml of water.  - Pt to not decrease tube feeding regimen until po intake is improved.  - If weight loss does not slow down, recommend pt resume home health care for continuous feeds over night.  Monitor: Pt will continue to tolerate bolus tube feedings to promote weight maintenance and healing. Goal is to meet greater than 90% of pt's estimated needs.  Ebbie Latus RD, LDN

## 2012-08-03 NOTE — Progress Notes (Signed)
Radiation Oncology         [phone]) [phone] ________________________________  Name: DRAXTON LUU MRN: [id number]  Date: 08/03/2012  DOB: 12-20-1980  Follow-Up Visit Note  CC: [name], MD  Karleen Hampshire, MD  Diagnosis:   Recurrent oral tongue cancer  Interval Since Last Radiation:  3  weeks  Narrative:  The patient returns today for continued close followup. He is starting to feel better. He can take in some sips of water but no liquid food or solid food by mouth. He is tapering his Duragesic patch  per medical oncology.  He is not taking any other pain medication at this time.   He denies any chills or fever.  He is taking in 6 cans of supplements per day through his feeding tube.                       ALLERGIES:  has No Known Allergies.  Meds: Current Outpatient Prescriptions  Medication Sig Dispense Refill  . chlorhexidine (PERIDEX) 0.12 % solution Rinse with 15 mls twice daily for 30 seconds. Use after breakfast and at bedtime. Spit out excess. Do not swallow.  480 mL  prn  . fentaNYL (DURAGESIC - DOSED MCG/HR) 50 MCG/HR Place 1 patch (50 mcg total) onto the skin every 3 (three) days.  10 patch  0  . sodium fluoride (FLUORISHIELD) 1.1 % GEL dental gel Instill one drop of gel per tooth space of fluoride tray. Place over teeth for 5 minutes. Remove. Spit out excess. Repeat nightly.  120 mL  prn  . Alum & Mag Hydroxide-Simeth (MAGIC MOUTHWASH) SOLN Take 15 mLs by mouth 4 (four) times daily.  473 mL  2  . Alum & Mag Hydroxide-Simeth (MAGIC MOUTHWASH) SOLN Take by mouth.      Marland Kitchen buPROPion (WELLBUTRIN SR) 150 MG 12 hr tablet Take 150 mg by mouth 2 (two) times daily.       . chlorproMAZINE (THORAZINE) 25 MG tablet Take 1 tablet (25 mg total) by mouth 3 (three) times daily.  25 tablet  1  . fluconazole (DIFLUCAN) 100 MG tablet Take 1 tablet (100 mg total) by mouth daily.  8 tablet  0  . hyaluronate sodium (RADIAPLEXRX) GEL Apply topically 2 (two) times daily.      Marland Kitchen  HYDROcodone-acetaminophen (HYCET) 7.5-325 mg/15 ml solution Take 15 mLs by mouth every 4 (four) hours as needed for pain.  473 mL  0  . HYDROcodone-acetaminophen (HYCET) 7.5-325 mg/15 ml solution Take 10-15 mLs by mouth every 4 (four) hours as needed.  480 mL  1  . ibuprofen (ADVIL,MOTRIN) 200 MG tablet Take 400 mg by mouth every 6 (six) hours as needed for pain.      Marland Kitchen lidocaine (XYLOCAINE) 2 % solution Take 15 mLs by mouth as needed for pain. 1:1 mixture with Robitussin to thin out thick mucus.  500 mL  2  . LORazepam (ATIVAN) 0.5 MG tablet Take 1 tablet (0.5 mg total) by mouth every 6 (six) hours as needed (anticipatory nausea or vomiting).  30 tablet  1  . ondansetron (ZOFRAN) 8 MG tablet Take 1 tablet (8 mg total) by mouth every 12 (twelve) hours as needed. Take two times a day as needed for nausea or vomiting starting on the third day after chemotherapy.  30 tablet  3  . prochlorperazine (COMPAZINE) 10 MG tablet Take 1 tablet (10 mg total) by mouth every 6 (six) hours as needed (Nausea or vomiting).  30  tablet  1  . prochlorperazine (COMPAZINE) 25 MG suppository Place 1 suppository (25 mg total) rectally every 12 (twelve) hours as needed for nausea.  12 suppository  3  . simvastatin (ZOCOR) 10 MG tablet Take 10 mg by mouth every evening.        No current facility-administered medications for this encounter.    Physical Findings: The patient is in no acute distress. Patient is alert and oriented.  weight is 243 lb 9.6 oz (110.496 kg). His blood pressure is 131/76 and his pulse is 103. His respiration is 16 and oxygen saturation is 100%. .  The lungs are clear. The heart has a regular rhythm and rate. Examination of the neck area reveals dry desquamation. There is no moist desquamation noted at this time. There is no palpable adenopathy in the neck. The oral cavity reveals improvement in his mucositis. The saliva continues to be thick and tenacious. There is no secondary infection.  Lab  Findings: Lab Results  Component Value Date   WBC 5.0 07/30/2012   HGB 12.4* 07/30/2012   HCT 35.0* 07/30/2012   MCV 83.7 07/30/2012   PLT 297 07/30/2012    @LASTCHEM @  Radiographic Findings: No results found.  Impression:  The patient is recovering from the effects of radiation.    Plan:   followup in one month  _____________________________________  -----------------------------------  Billie Lade, PhD, MD

## 2012-08-04 ENCOUNTER — Telehealth: Payer: Self-pay | Admitting: Oncology

## 2012-08-04 NOTE — Telephone Encounter (Signed)
Added nutrition appt per staff message from covering dietician, Rolly Salter. lmonvm for pt re 7/21 appt and mailed schedule for July and November.

## 2012-08-17 ENCOUNTER — Ambulatory Visit: Payer: 59 | Admitting: Nutrition

## 2012-08-17 NOTE — Progress Notes (Signed)
Patient presents for nutrition followup with his wife.  Patient states he currently is consuming 6 cans of Ensure Plus daily, via feeding tube.  He has been unable to increase volume of Ensure Plus secondary to intolerance.  He is drinking juices without difficulty and has tolerated creamed potatoes.  He does continue to have sores in his mouth.  He continues to drink water by mouth and via tube.  Weight has continued to decline and was documented as 236.2 pounds July 21 which is decreased from 243.6 pounds on July 7.  Nutrition diagnosis: Inadequate oral intake related to tongue cancer as evidenced by a continued weight loss, not met.  Intervention: Patient was educated to continue Ensure Plus 6 cans daily, via feeding tube providing 78% minimum caloric needs.  I educated him to increase liquids by mouth including juice-based supplements such as resource breeze.  I provided samples for him to try and educated him on where he can purchase this product.  I've also educated patient on strategies for increasing liquids and soft solids for more calories to minimize further weight loss.  Teach back method used.  Monitoring, evaluation, goals: Patient has been unable to increase tube feedings to 8 cans daily, and has had continued weight loss.  Patient will work to increase oral intake and add juice-based supplements twice a day to meet greater than 90% of his estimated needs.  Next visit: Monday, August 18 after M.D. appointment.

## 2012-08-25 ENCOUNTER — Encounter (HOSPITAL_COMMUNITY): Payer: Self-pay | Admitting: Dentistry

## 2012-08-25 ENCOUNTER — Ambulatory Visit (HOSPITAL_COMMUNITY): Payer: Medicaid - Dental | Admitting: Dentistry

## 2012-08-25 VITALS — BP 138/84 | HR 86 | Temp 97.9°F

## 2012-08-25 DIAGNOSIS — R1310 Dysphagia, unspecified: Secondary | ICD-10-CM

## 2012-08-25 DIAGNOSIS — R634 Abnormal weight loss: Secondary | ICD-10-CM

## 2012-08-25 DIAGNOSIS — C023 Malignant neoplasm of anterior two-thirds of tongue, part unspecified: Secondary | ICD-10-CM

## 2012-08-25 DIAGNOSIS — K1233 Oral mucositis (ulcerative) due to radiation: Secondary | ICD-10-CM

## 2012-08-25 DIAGNOSIS — K117 Disturbances of salivary secretion: Secondary | ICD-10-CM

## 2012-08-25 DIAGNOSIS — Z85810 Personal history of malignant neoplasm of tongue: Secondary | ICD-10-CM

## 2012-08-25 DIAGNOSIS — R432 Parageusia: Secondary | ICD-10-CM

## 2012-08-25 DIAGNOSIS — Z0189 Encounter for other specified special examinations: Secondary | ICD-10-CM

## 2012-08-25 DIAGNOSIS — Z09 Encounter for follow-up examination after completed treatment for conditions other than malignant neoplasm: Secondary | ICD-10-CM

## 2012-08-25 NOTE — Patient Instructions (Addendum)
RECOMMENDATIONS: 1. Brush after meals and at bedtime.  Use fluoride at bedtime. 2. Use trismus exercises as directed. 3. Use Biotene Rinse or salt water/baking soda rinses. 4. Multiple sips of water as needed. 5. The patient is to contact Dr. Fonnie Jarvis and schedule an exam and cleaning appointment in mid September of 2014. Patient to have Dr. Mylinda Latina evaluate for restoration of the dental caries previously noted during my initial examination.  Call if problems before then.  Dr. Kristin Bruins   RADIATION THERAPY AND DECISIONS REGARDING YOUR TEETH  Xerostomia (dry mouth) Your salivary glands may be in the filed of radiation.  Radiation may include all or part of your saliva glands.  This will cause your saliva to dry up and you will have a dry mouth.  The dry mouth will be for the rest of your life unless your radiation oncologist tells you otherwise.  Your saliva has many functions:  Saliva wets your tongue for speaking.  It coats your teeth and the inside of your mouth for easier movement.  It helps with chewing and swallowing food.  It helps clean away harmful acid and toxic products made by the germs in your mouth, therefore it helps prevent cavities.  It kills some germs in your mouth and helps to prevent gum disease.  It helps to carry flavor to your taste buds.  Once you have lost your saliva you will be at higher risk for tooth decay and gum disease.  What can be done to help improve your mouth when there's not enough saliva:  1.  Your dentist may give a prescription for Salagen.  It will not bring back all of your saliva but may bring back some of it.  Also your saliva may be thick and ropy or white and foamy. It will not feel like it use to feel.  2.  You will need to swish with water every time your mouth feels dry.  YOU CANNOT suck on any cough drops, mints, lemon drops, candy, vitamin C or any other products.  You cannot use anything other than water to make your mouth feel  less dry.  If you want to drink anything else you have to drink it all at once and brush afterwards.  Be sure to discuss the details of your diet habits with your dentist or hygienist.  Radiation caries: This is decay that happens very quickly once your mouth is very dry due to radiation therapy.  Normally cavities take six months to two years to become a problem.  When you have dry mouth cavities may take as little as eight weeks to cause you a problem.  This is why dental check ups every two months are necessary as long as you have a dry mouth. Radiation caries typically, but not always, start at your gum line where it is hard to see the cavity.  It is therefore also hard to fill these cavities adequately.  This high rate of cavities happens because your mouth no longer has saliva and therefore the acid made by the germs starts the decay process.  Whenever you eat anything the germs in your mouth change the food into acid.  The acid then burns a small hole in your tooth.  This small hole is the beginning of a cavity.  If this is not treated then it will grow bigger and become a cavity.  The way to avoid this hole getting bigger is to use fluoride every evening as prescribed by your  dentist.  You have to make sure that your teeth are very clean before you use the fluoride.  This fluoride in turn will strengthen your teeth and prepare them for another day of fighting acid.  If you develop radiation caries many times the damage is so large that you will have to have all your teeth removed.  This could be a big problem if some of these teeth are in the field of radiation.  Further details of why this could be a big problem will follow.  (See Osteoradionecrosis).  Loss of taste (dysgeusia) This happens to varying degrees once you've had radiation therapy to your jaw region.  Many times taste is not completely lost but becomes limited.  The loss of taste is mostly due to radiation affecting your taste buds.   However if you have no saliva in your mouth to carry the flavor to your taste buds it would be difficult for your taste buds to taste anything.  That is why using water or a prescription for Salagen prior to meals and during meals may help with some of the taste.  Keep in mind that taste generally returns very slowly over the course of several months or several years after radiation therapy.  Don't give up hope.  Trismus According to your Radiation Oncologist your TMJ or jaw joints are going to be partially or fully in the field of radiation.  This means that over time the muscles that help you open and close your mouth may get stiff.  This will potentially result in your not being able to open your mouth wide enough or as wide as you can open it now.  Le me give you an example of how slowly this happens and how unaware people are of it.  A gentlemen that had radiation therapy two years ago came back to me complaining that bananas are just too large for him to be able to fit them in between his teeth.  He was not able to open wide enough to bite into a banana.  This happens slowly and over a period of time.  What do we do to try and prevent this?  Your dentist will probably give you a stack of sticks called a trismus exercise device .  This stack will help your remind your muscles and your jaw joint to open up to the same distance every day.  Use these sticks every morning when you wake up according to the instructions given by the dentist.   You must use these sticks for at least one to two years after radiation therapy.  The reason for that is because it happens so slowly and keeps going on for about two years after radiation therapy.  Your hospital dentist will help you monitor your mouth opening and make sure that it's not getting smaller.  Osteoradionecrosis (ORN) This is a condition where your jaw bone after having had radiation therapy becomes very dry.  It has very little blood supply to keep it  alive.  If you develop a cavity that turns into an abscess or an infection then the jaw bone does not have enough blood supply to help fight the infection.  At this point it is very likely that the infection could cause the death of your jaw bone.  When you have dead bone it has to be removed.  Therefore you might end up having to have surgery to remove part of your jaw bone, the part of the jaw bone  that has been affected.   Healing is also a problem if you are to have surgery in the areas where the bone has had radiation therapy.  The same reasons apply.  If you have surgery you need more blood supply which is not available.  When blood supply and oxygen are not available again, there is a chance for the bone to die.  Occasionally ORN happens on its own with no obvious reason.  This is quite rare.  We believe that patients who continue to smoke and/or drink alcohol have a higher chance of having this bone problem.  Therefore once your jaw bone has had radiation therapy if there are any teeth in that area, you should never have them pulled.  You should also never have any surgery on your teeth or gums in that area unless the oral surgeon or Periodontist is aware of your history of radiation. There is some expensive management techniques that might be used to limit your risks.  The risks for ORN either from infection or spontaneous ( or on it's own) are life long.    TRISMUS  Trismus is a condition where the jaw does not allow the mouth to open as wide as it usually does.  This can happen almost suddenly, or in other cases the process is so slow, it is hard to notice it-until it is too far along.  When the jaw joints and/or muscles have been exposed to radiation treatments, the onset of Trismus is very slow.  This is because the muscles are losing their stretching ability over a long period of time, as long as 2 YEARS after the end of radiation.  It is therefore important to exercise these muscles and  joints.  TRISMUS EXERCISES   Stack of tongue depressors measuring the same or a little less than the last documented MIO (Maximum Interincisal Opening).  Secure them with a rubber band on both ends.  Place the stack in the patient's mouth, supporting the other end.  Allow 30 seconds for muscle stretching.  Rest for a few seconds.  Repeat 3-5 times  For all radiation patients, this exercise is recommended in the mornings and evenings unless otherwise instructed.  The exercise should be done for a period of 2 YEARS after the end of radiation.  MIO should be checked routinely on recall dental visits by the general dentist or the hospital dentist.  The patient is advised to report any changes, soreness, or difficulties encountered when doing the exercises.  FLUORIDE TRAYS PATIENT INSTRUCTIONS    Obtain prescription from the pharmacy.  Don't be surprised if it needs to be ordered.   Be sure to let the pharmacy know when you are close to needing a new refill for them to have it ready for you without interruption of Fluoride use.   The best time to use your Fluoride is before bed time.   You must brush your teeth very well and floss before using the Fluoride in order to get the best use out of the Fluoride treatments.   Place 1 drop of Fluoride gel per tooth in the tray.   Place the tray on your lower teeth and/or your upper teeth.  Make sure the trays are seated all the way.  Remember, they only fit one way on your teeth.   Insert for 5 full minutes.   At the end of the 5 minutes, take the trays out.  SPIT OUT excess. .    Do NOT rinse  your mouth!    Do NOT eat or drink after treatments for at least 30 minutes.  This is why the best time for your treatments is before bedtime.    Clean the inside of your Fluoride trays using COLD WATER and a toothbrush.    In order to keep your Trays from discoloring and free from odors, soak them overnight in denture  cleaners such as Efferdent.  Do not use bleach or non denture products.    Store the trays in a safe dry place AWAY from any heat until your next treatment.    Bring the trays with you for your next dental check-up.  The dentist will confirm their fit.    If anything happens to your Fluoride trays, or they don't fit as well after any dental work, please let us know as soon as possible.

## 2012-08-25 NOTE — Progress Notes (Signed)
08/25/2012  Patient:            [name] Date of Birth:  October 03, 1980 MRN:                [id number]  BP 138/84  Pulse 86  Temp(Src) 97.9 F (36.6 C) (Oral)   [name] presents for periodic oral examination after radiation therapy. 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 Patient underwent radiation therapy from 06/02/2012 through 07/14/2012.  The patient now presents for periodic oral examination after radiation therapy.   REVIEW OF CHIEF COMPLAINTS:  DRY MOUTH: Yes HARD TO SWALLOW: Yes, at times.  HURT TO SWALLOW: Yes, at times. TASTE CHANGES: Taste is improving SORES IN MOUTH: Yes, on the left lateral tongue. TRISMUS: The patient has a decreased maximum interincisal opening of 25 mm. Patient is performing trismus exercises provided by dental medicine as well as physical therapy. WEIGHT: 236 pounds  HOME OH REGIMEN:  BRUSHING: Up to 6 times a day. FLOSSING: At bedtime RINSING: Rinsing with Biotene and salt water and baking soda rinses. FLUORIDE: Patient is using fluoride at bedtime. TRISMUS EXERCISES:  Maximum interincisal opening: 25 mm   DENTAL EXAM:  Oral Hygiene:(PLAQUE): Good oral hygiene. Oral hygiene instructions were provided. LOCATION OF MUCOSITIS: Left lateral tongue and bilateral buccal mucosa. Patient indicates that mucositis is resolving. DESCRIPTION OF SALIVA: Decreased and foamy saliva. ANY EXPOSED BONE: None noted OTHER WATCHED AREAS: Tooth numbers 24 and 25 with a history of mobility and previous free gingival graft. Previous dental caries to be restored by primary dentist.  DX:  1. 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 Status post chemoradiation therapy 3. Xerostomia 4. Mucositis 5. Dysgeusia 6. Dysphagia 7. Odynophagia 8. Decreased maximum interincisal opening and history of trismus symptoms 9. Dental caries 10. Need for evaluation of tooth numbers 24 and 25 for periodic risk  assessment  RECOMMENDATIONS: 1. Brush after meals and at bedtime.  Use fluoride at bedtime. 2. Use trismus exercises as directed. 3. Use Biotene Rinse or salt water/baking soda rinses. 4. Multiple sips of water as needed. 5. The patient is to contact Dr. Fonnie Jarvis and schedule an exam and cleaning appointment in mid September of 2014. Patient to have Dr. Mylinda Latina evaluate for restoration of the dental caries previously noted during my initial examination.  Call if problems before then.   Charlynne Pander, DDS

## 2012-08-26 ENCOUNTER — Encounter: Payer: Self-pay | Admitting: Radiation Oncology

## 2012-08-26 NOTE — Progress Notes (Signed)
rec'd paperwork from [facility] (disability) - forward to nursing for completion

## 2012-09-07 ENCOUNTER — Telehealth: Payer: Self-pay | Admitting: *Deleted

## 2012-09-07 NOTE — Telephone Encounter (Signed)
Left vm for pt informing him Dr Roselind Messier will refill Duragesic script; pt must pick up script w/photo ID. 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 Asked for call back, informed pt this RN works 7am - 4 pm and will be in office tomorrow. 09/08/12 Spoke w/pt and informed him prescription for Duragesic Patch 25 mcg ready for pick up in radiation oncology nursing area. Pt states he will pick up tomorrow. Pt states he has insurance papers w/questions for Dr Roselind Messier to answer. Pt states he will bring papers tomorrow. Informed pt Dr Roselind Messier out of office tomorrow but will be in office all day Thursday. Pt verbalized understanding.

## 2012-09-08 ENCOUNTER — Other Ambulatory Visit: Payer: Self-pay | Admitting: Radiation Oncology

## 2012-09-08 MED ORDER — FENTANYL 25 MCG/HR TD PT72: 1.0000 | MEDICATED_PATCH | TRANSDERMAL | Status: DC

## 2012-09-09 ENCOUNTER — Telehealth: Payer: Self-pay | Admitting: *Deleted

## 2012-09-09 ENCOUNTER — Encounter: Payer: Self-pay | Admitting: Radiation Oncology

## 2012-09-09 DIAGNOSIS — C029 Malignant neoplasm of tongue, unspecified: Secondary | ICD-10-CM | POA: Insufficient documentation

## 2012-09-10 NOTE — Telephone Encounter (Signed)
Called pt and informed him FMLA papers ready. He requested papers be faxed to number on form. FMLA papers taken to [facility] and requested her to fax. B Puhala verbalized understanding.

## 2012-09-14 ENCOUNTER — Encounter: Payer: Self-pay | Admitting: Radiation Oncology

## 2012-09-14 ENCOUNTER — Ambulatory Visit
Admission: RE | Admit: 2012-09-14 | Discharge: 2012-09-14 | Disposition: A | Discharge status: Home / Self Care | Payer: 59 | Source: Ambulatory Visit | Attending: Radiation Oncology | Admitting: Radiation Oncology

## 2012-09-14 ENCOUNTER — Encounter: Payer: Self-pay | Admitting: Nutrition

## 2012-09-14 ENCOUNTER — Telehealth: Payer: Self-pay | Admitting: *Deleted

## 2012-09-14 VITALS — BP 116/62 | HR 81 | Temp 97.8°F | Resp 20 | Wt 230.5 lb

## 2012-09-14 DIAGNOSIS — Z923 Personal history of irradiation: Secondary | ICD-10-CM

## 2012-09-14 DIAGNOSIS — C029 Malignant neoplasm of tongue, unspecified: Secondary | ICD-10-CM

## 2012-09-14 NOTE — Progress Notes (Addendum)
Pt denies pain, states he took Duragesic patch off yesterday and has not replaced. Pt states he has not used peg tube x 3-4 weeks but flushes daily. Pt eating all foods but has no taste buds and does still have loss of appetite. Pt denies thick saliva, states he did not have dry mouth until he began Doxycycline for "ingrown facial hairs". Pt states energy level improving. Pt has appointment w/nutritionist today 2:30 pm. He is aware.

## 2012-09-14 NOTE — Telephone Encounter (Signed)
Called Inetta Fermo in Interventional Radiology to arrange removal of feeding tube on this patient, lvm for a return call

## 2012-09-14 NOTE — Progress Notes (Signed)
Radiation Oncology         [id number]) [phone] ________________________________  Name: [name] MRN: [id number]  Date: 09/14/2012  DOB: 01-12-1981  Follow-Up Visit Note  CC: [name], MD  Serena Colonel, MD  Diagnosis:   Recurrent oral tongue cancer  Interval Since Last Radiation:  2  months  Narrative:  The patient returns today for routine follow-up.  He continues to make good progress. His Duragesic patches been off at least 24 hours and is not requesting any additional pain medication. He continues to have a poor taste but is eating to maintain his weight. He was started on doxycycline for "ingrown hairs". According to the patient and his wife these became infected. He is doing much better with his antibiotic but does feel this caused the more problems with a dry mouth.  He is able to eat some meat at this time. He denies any cough or breathing problems. His energy level continues to improve. he will tentatively get back to work in approximately 3-4 weeks.  The patient is requesting that his feeding tube to be removed. He has noticed decreased hearing out of his left ear. He also feels he has fluid in his left ear                             ALLERGIES:  has No Known Allergies.  Meds: Current Outpatient Prescriptions  Medication Sig Dispense Refill  . buPROPion (WELLBUTRIN SR) 150 MG 12 hr tablet Take 150 mg by mouth 2 (two) times daily.       . chlorhexidine (PERIDEX) 0.12 % solution Rinse with 15 mls twice daily for 30 seconds. Use after breakfast and at bedtime. Spit out excess. Do not swallow.  480 mL  prn  . clindamycin (CLEOCIN T) 1 % external solution       . doxycycline (VIBRA-TABS) 100 MG tablet       . ibuprofen (ADVIL,MOTRIN) 200 MG tablet Take 400 mg by mouth every 6 (six) hours as needed for pain.      . simvastatin (ZOCOR) 10 MG tablet Take 10 mg by mouth every evening.       . sodium fluoride (FLUORISHIELD) 1.1 % GEL dental gel Instill one drop of gel per tooth  space of fluoride tray. Place over teeth for 5 minutes. Remove. Spit out excess. Repeat nightly.  120 mL  prn  . Alum & Mag Hydroxide-Simeth (MAGIC MOUTHWASH) SOLN Take 15 mLs by mouth 4 (four) times daily.  473 mL  2  . Alum & Mag Hydroxide-Simeth (MAGIC MOUTHWASH) SOLN Take by mouth.      . chlorproMAZINE (THORAZINE) 25 MG tablet Take 1 tablet (25 mg total) by mouth 3 (three) times daily.  25 tablet  1  . fentaNYL (DURAGESIC - DOSED MCG/HR) 25 MCG/HR patch Place 1 patch (25 mcg total) onto the skin every 3 (three) days.  5 patch  0  . HYDROcodone-acetaminophen (HYCET) 7.5-325 mg/15 ml solution Take 15 mLs by mouth every 4 (four) hours as needed for pain.  473 mL  0  . lidocaine (XYLOCAINE) 2 % solution Take 15 mLs by mouth as needed for pain. 1:1 mixture with Robitussin to thin out thick mucus.  500 mL  2  . LORazepam (ATIVAN) 0.5 MG tablet Take 1 tablet (0.5 mg total) by mouth every 6 (six) hours as needed (anticipatory nausea or vomiting).  30 tablet  1  . ondansetron (ZOFRAN) 8  MG tablet Take 1 tablet (8 mg total) by mouth every 12 (twelve) hours as needed. Take two times a day as needed for nausea or vomiting starting on the third day after chemotherapy.  30 tablet  3  . prochlorperazine (COMPAZINE) 10 MG tablet Take 1 tablet (10 mg total) by mouth every 6 (six) hours as needed (Nausea or vomiting).  30 tablet  1  . prochlorperazine (COMPAZINE) 25 MG suppository Place 1 suppository (25 mg total) rectally every 12 (twelve) hours as needed for nausea.  12 suppository  3   No current facility-administered medications for this encounter.    Physical Findings: The patient is in no acute distress. Patient is alert and oriented.  weight is 230 lb 8 oz (104.554 kg). His temperature is 97.8 F (36.6 C). His blood pressure is 116/62 and his pulse is 81. His respiration is 20. Marland Kitchen  No palpable cervical supraclavicular or axillary adenopathy. The patient's skin is well healed. He continues to have some  trismus but is doing his trismus exercises.   the oral cavity shows thickened saliva without infection. Palpation along the oral tongue reveals no suspicious areas. The right tympanic membrane is benign. The left tympanic membrane he has decreased light reflex and has a yellowish tint.  Lab Findings: Lab Results  Component Value Date   WBC 5.0 07/30/2012   HGB 12.4* 07/30/2012   HCT 35.0* 07/30/2012   MCV 83.7 07/30/2012   PLT 297 07/30/2012     Radiographic Findings: No results found.  Impression:  The patient is recovering from the effects of radiation.  No evidence of recurrence on clinical exam today.  Plan:  I have placed order for feeding tube removal. The patient will schedule followup in the near future with Dr. Pollyann Kennedy concerning his left ear.  Routine followup in radiation oncology at October. He will also followup with medical oncology in October.  _____________________________________  -----------------------------------  Billie Lade, PhD, MD

## 2012-09-18 ENCOUNTER — Ambulatory Visit (HOSPITAL_COMMUNITY)
Admission: RE | Admit: 2012-09-18 | Discharge: 2012-09-18 | Disposition: A | Discharge status: Home / Self Care | Payer: 59 | Source: Ambulatory Visit | Attending: Radiation Oncology | Admitting: Radiation Oncology

## 2012-09-18 ENCOUNTER — Other Ambulatory Visit (HOSPITAL_COMMUNITY): Payer: Self-pay

## 2012-09-18 DIAGNOSIS — Z5111 Encounter for antineoplastic chemotherapy: Secondary | ICD-10-CM | POA: Insufficient documentation

## 2012-09-18 DIAGNOSIS — C029 Malignant neoplasm of tongue, unspecified: Secondary | ICD-10-CM

## 2012-09-18 DIAGNOSIS — Z923 Personal history of irradiation: Secondary | ICD-10-CM

## 2012-09-18 MED ORDER — LIDOCAINE VISCOUS 2 % MT SOLN
15.0000 mL | Freq: Once | OROMUCOSAL | Status: AC
  Administered 2012-09-18: 15 mL via OROMUCOSAL
  Filled 2012-09-18: qty 15

## 2012-09-18 NOTE — Procedures (Signed)
Successful removal of bumper retention gastrostomy tube. No complications  Brayton El PA-C Interventional Radiology 09/18/2012 12:22 PM

## 2012-09-23 ENCOUNTER — Telehealth: Payer: Self-pay | Admitting: *Deleted

## 2012-09-23 NOTE — Telephone Encounter (Signed)
Received call from pt stating he wants to return to work Tues, Sept 2, 2014. 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 He states "I feel good." Pt denies pain. Pt states he needs "a generic letter releasing me to go back to work".  Informed pt Dr Roselind Messier is in breast clinic this afternoon, will be in this office all day tomorrow. Pt states he can come by and pick up the letter tomorrow. Informed pt will route his request to Dr Roselind Messier and follow up w/him tomorrow.

## 2012-09-24 ENCOUNTER — Encounter: Payer: Self-pay | Admitting: Radiation Oncology

## 2012-09-24 NOTE — Progress Notes (Signed)
   Department of Radiation Oncology  Phone:  [phone] Fax:        [phone]  To Whom It May Concern  Mr. Ryser will be ready to return to work full-time no restrictions on 09/29/2012. Please call if they have any questions.  -----------------------------------  Billie Lade, PhD, MD

## 2012-11-11 ENCOUNTER — Telehealth: Payer: Self-pay | Admitting: Hematology and Oncology

## 2012-11-11 NOTE — Telephone Encounter (Signed)
Pt's mother in law called for appt today. Pt already on schedule for lb/CP2. Moved f/u to NG and gv mother in law appt d/t for lb/NG 11/3 @ 11:30am.

## 2012-11-16 ENCOUNTER — Encounter: Payer: Self-pay | Admitting: Radiation Oncology

## 2012-11-16 ENCOUNTER — Ambulatory Visit
Admission: RE | Admit: 2012-11-16 | Discharge: 2012-11-16 | Disposition: A | Discharge status: Home / Self Care | Payer: 59 | Source: Ambulatory Visit | Attending: Radiation Oncology | Admitting: Radiation Oncology

## 2012-11-16 VITALS — BP 121/72 | HR 86 | Temp 97.9°F | Ht 74.0 in | Wt 225.8 lb

## 2012-11-16 DIAGNOSIS — C023 Malignant neoplasm of anterior two-thirds of tongue, part unspecified: Secondary | ICD-10-CM

## 2012-11-16 NOTE — Progress Notes (Signed)
Radiation Oncology         [phone]) [phone] ________________________________  Name: [name] MRN: [id number]  Date: 11/16/2012  DOB: 22-Apr-1980  Follow-Up Visit Note  CC: [name], MD  Serena Colonel, MD  Diagnosis:   Recurrent oral tongue cancer  Interval Since Last Radiation:  4  months  Narrative:  The patient returns today for routine follow-up.  He continues to make good progress. He is back to working full-time and is working possibly 50-60 hours per week. He continues to have a poor taste but this is also improving. He is eating 3 meals a day primarily soft foods. He has difficult with dry foods.   he denies any pain in the oral cavity neck or blood-tinged sputum. He is on no pain medications at this time.                              ALLERGIES:  has No Known Allergies.  Meds: Current Outpatient Prescriptions  Medication Sig Dispense Refill  . simvastatin (ZOCOR) 10 MG tablet Take 10 mg by mouth every evening.       . sodium fluoride (FLUORISHIELD) 1.1 % GEL dental gel Instill one drop of gel per tooth space of fluoride tray. Place over teeth for 5 minutes. Remove. Spit out excess. Repeat nightly.  120 mL  prn  . Alum & Mag Hydroxide-Simeth (MAGIC MOUTHWASH) SOLN Take 15 mLs by mouth 4 (four) times daily.  473 mL  2  . Alum & Mag Hydroxide-Simeth (MAGIC MOUTHWASH) SOLN Take by mouth.      Marland Kitchen buPROPion (WELLBUTRIN SR) 150 MG 12 hr tablet Take 150 mg by mouth 2 (two) times daily.       . chlorhexidine (PERIDEX) 0.12 % solution Rinse with 15 mls twice daily for 30 seconds. Use after breakfast and at bedtime. Spit out excess. Do not swallow.  480 mL  prn  . chlorproMAZINE (THORAZINE) 25 MG tablet Take 1 tablet (25 mg total) by mouth 3 (three) times daily.  25 tablet  1  . clindamycin (CLEOCIN T) 1 % external solution       . doxycycline (VIBRA-TABS) 100 MG tablet       . fentaNYL (DURAGESIC - DOSED MCG/HR) 25 MCG/HR patch Place 1 patch (25 mcg total) onto the skin every 3  (three) days.  5 patch  0  . HYDROcodone-acetaminophen (HYCET) 7.5-325 mg/15 ml solution Take 15 mLs by mouth every 4 (four) hours as needed for pain.  473 mL  0  . ibuprofen (ADVIL,MOTRIN) 200 MG tablet Take 400 mg by mouth every 6 (six) hours as needed for pain.      Marland Kitchen lidocaine (XYLOCAINE) 2 % solution Take 15 mLs by mouth as needed for pain. 1:1 mixture with Robitussin to thin out thick mucus.  500 mL  2  . LORazepam (ATIVAN) 0.5 MG tablet Take 1 tablet (0.5 mg total) by mouth every 6 (six) hours as needed (anticipatory nausea or vomiting).  30 tablet  1  . ondansetron (ZOFRAN) 8 MG tablet Take 1 tablet (8 mg total) by mouth every 12 (twelve) hours as needed. Take two times a day as needed for nausea or vomiting starting on the third day after chemotherapy.  30 tablet  3  . prochlorperazine (COMPAZINE) 10 MG tablet Take 1 tablet (10 mg total) by mouth every 6 (six) hours as needed (Nausea or vomiting).  30 tablet  1  .  prochlorperazine (COMPAZINE) 25 MG suppository Place 1 suppository (25 mg total) rectally every 12 (twelve) hours as needed for nausea.  12 suppository  3   No current facility-administered medications for this encounter.    Physical Findings: The patient is in no acute distress. Patient is alert and oriented.  height is 6\' 2"  (1.88 m) and weight is 225 lb 12.8 oz (102.422 kg). His temperature is 97.9 F (36.6 C). His blood pressure is 121/72 and his pulse is 86. His oxygen saturation is 100%. .  The lungs are clear to auscultation. The heart has a regular rhythm and rate. The cervical supraclavicular and axillary areas are free of adenopathy. Examination of the oral cavity reveals the teeth to be in excellent repair. There are surgical changes along the right oral tongue without palpable or visible signs recurrence.  Lab Findings: Lab Results  Component Value Date   WBC 5.0 07/30/2012   HGB 12.4* 07/30/2012   HCT 35.0* 07/30/2012   MCV 83.7 07/30/2012   PLT 297 07/30/2012      Radiographic Findings: No results found.  Impression:  The patient is recovering from the effects of radiation.  No evidence of recurrence on clinical exam today  Plan:  Patient will followup in 3 months. Early next month patient will followup with medical oncology and will undergo a PET scan at that time. Patient will likely have blood work including thyroid function studies at that time.  The patient will be following up with ear nose and throat later this fall.  _____________________________________  -----------------------------------  Billie Lade, PhD, MD

## 2012-11-16 NOTE — Progress Notes (Signed)
[name] here for follow up after treatment to his tongue.  He denies pain.  He reports a dry mouth and that his taste is about half way back to normal.  He did have his peg tube removed in August.  He is able to eat everything but dry foods.  He has lost 5 lbs since his last visit.  He has gone back to work and is more active.  He does eat 3 times per day.  He denies dizziness with standing.  He denies fatigue.  He says his strenght has not come back yet.  He denies hearing changes.  His oral mucosa is intact.  His skin is intact in the treatment area.

## 2012-11-30 ENCOUNTER — Ambulatory Visit (HOSPITAL_BASED_OUTPATIENT_CLINIC_OR_DEPARTMENT_OTHER): Payer: 59 | Admitting: Lab

## 2012-11-30 ENCOUNTER — Telehealth: Payer: Self-pay | Admitting: Hematology and Oncology

## 2012-11-30 ENCOUNTER — Encounter: Payer: Self-pay | Admitting: Hematology and Oncology

## 2012-11-30 ENCOUNTER — Ambulatory Visit (HOSPITAL_BASED_OUTPATIENT_CLINIC_OR_DEPARTMENT_OTHER): Payer: 59 | Admitting: Hematology and Oncology

## 2012-11-30 ENCOUNTER — Other Ambulatory Visit: Payer: 59 | Admitting: Lab

## 2012-11-30 VITALS — BP 115/69 | HR 78 | Temp 97.4°F | Resp 19 | Ht 74.0 in | Wt 227.8 lb

## 2012-11-30 DIAGNOSIS — C770 Secondary and unspecified malignant neoplasm of lymph nodes of head, face and neck: Secondary | ICD-10-CM

## 2012-11-30 DIAGNOSIS — C029 Malignant neoplasm of tongue, unspecified: Secondary | ICD-10-CM

## 2012-11-30 DIAGNOSIS — K117 Disturbances of salivary secretion: Secondary | ICD-10-CM | POA: Insufficient documentation

## 2012-11-30 HISTORY — DX: Disturbances of salivary secretion: K11.7

## 2012-11-30 LAB — COMPREHENSIVE METABOLIC PANEL (CC13)
ALT: 23 U/L (ref 0–55)
AST: 20 U/L (ref 5–34)
Albumin: 4 g/dL (ref 3.5–5.0)
Alkaline Phosphatase: 68 U/L (ref 40–150)
BUN: 13.7 mg/dL (ref 7.0–26.0)
Calcium: 9.7 mg/dL (ref 8.4–10.4)
Chloride: 105 mEq/L (ref 98–109)
Glucose: 91 mg/dl (ref 70–140)
Potassium: 4.3 mEq/L (ref 3.5–5.1)
Sodium: 139 mEq/L (ref 136–145)
Total Bilirubin: 0.44 mg/dL (ref 0.20–1.20)
Total Protein: 7.4 g/dL (ref 6.4–8.3)

## 2012-11-30 LAB — CBC WITH DIFFERENTIAL/PLATELET
BASO%: 1 % (ref 0.0–2.0)
EOS%: 2.9 % (ref 0.0–7.0)
HCT: 39.7 % (ref 38.4–49.9)
HGB: 13.4 g/dL (ref 13.0–17.1)
LYMPH%: 8.4 % — ABNORMAL LOW (ref 14.0–49.0)
MCH: 30.2 pg (ref 27.2–33.4)
MCHC: 33.7 g/dL (ref 32.0–36.0)
MCV: 89.7 fL (ref 79.3–98.0)
MONO#: 0.6 10*3/uL (ref 0.1–0.9)
MONO%: 10.6 % (ref 0.0–14.0)
Platelets: 214 10*3/uL (ref 140–400)
RBC: 4.43 10*6/uL (ref 4.20–5.82)
RDW: 12.8 % (ref 11.0–14.6)

## 2012-11-30 MED ORDER — PILOCARPINE HCL 5 MG PO TABS: 5.0000 mg | ORAL_TABLET | Freq: Three times a day (TID) | ORAL | Status: DC

## 2012-11-30 NOTE — Telephone Encounter (Signed)
Gave pt appt for md 11/18, sent pt to labs today

## 2012-11-30 NOTE — Patient Instructions (Signed)
Pilocarpine tablets What is this medicine? PILOCARPINE (PYE loe KAR peen) can increase saliva in the mouth. This medicine is used to treat dry mouth from radiation treatment or from Sjogren's syndrome. This medicine may be used for other purposes; ask your health care provider or pharmacist if you have questions. What should I tell my health care provider before I take this medicine? They need to know if you have any of these conditions: -eye infection or other eye problems -glaucoma -heart disease -liver disease -lung or breathing disease, like asthma -an unusual or allergic reaction to pilocarpine, other medicines, foods, dyes, or preservatives -pregnant or trying to get pregnant -breast-feeding How should I use this medicine? Take this medicine by mouth with a glass of water. Follow the directions on the prescription label. Take your doses at regular intervals. Do not take your medicine more often than directed. Talk to your pediatrician regarding the use of this medicine in children. Special care may be needed. Overdosage: If you think you have taken too much of this medicine contact a poison control center or emergency room at once. NOTE: This medicine is only for you. Do not share this medicine with others. What if I miss a dose? If you miss a dose, take it as soon as you can. If it is almost time for your next dose, take only that dose. Do not take double or extra doses. What may interact with this medicine? -antihistamines for allergy, cough and cold -atropine -certain medicines for Alzheimer's disease like donepezil, galantamine, rivastigmine -certain medicines for bladder problems like bethanecol, oxybutynin, tolterodine -certain medicines for Parkinson's disease like benztropine, trihexyphenidyl -certain medicines for quitting smoking like nicotine -certain medicines for stomach problems like dicyclomine, hyoscyamine -certain medicines for travel sickness like  scopolamine -ipratropium -medicines for blood pressure or heart problems like metoprolol This list may not describe all possible interactions. Give your health care provider a list of all the medicines, herbs, non-prescription drugs, or dietary supplements you use. Also tell them if you smoke, drink alcohol, or use illegal drugs. Some items may interact with your medicine. What should I watch for while using this medicine? Visit your doctor for regular check ups. Tell your doctor if your symptoms do not get better or if they get worse. You may get blurry vision or have trouble telling how far something is from you. This may be a problem at night or when the lights are low. Do not drive, use machinery, or do anything that needs clear vision until you know how this medicine affects you. If you sweat a lot, drink enough to replace fluids. Do not get dehydrated. What side effects may I notice from receiving this medicine? Side effects that you should report to your doctor or health care professional as soon as possible: -allergic reactions like skin rash, itching or hives -breathing problems -confusion -irregular heartbeat -stomach pains -tremor -unusual blood pressure -unusually weak or tired -vomiting Side effects that usually do not require medical attention (report to your doctor or health care professional if they continue or are bothersome): -changes in vision -chills, flushing -diarrhea -headache -increased sweating -nausea -runny eyes, nose -urgent need to pass urine This list may not describe all possible side effects. Call your doctor for medical advice about side effects. You may report side effects to FDA at 1-800-FDA-1088. Where should I keep my medicine? Keep out of the reach of children. Store at room temperature between 15 and 30 degrees C (59 and 86 degrees F). Throw  away any unused medicine after the expiration date. NOTE: This sheet is a summary. It may not cover all  possible information. If you have questions about this medicine, talk to your doctor, pharmacist, or health care provider.  2013, [employer]. (08/13/2007 3:03:54 PM)

## 2012-11-30 NOTE — Progress Notes (Signed)
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 Margin was not involved. He underwent right neck dissection on 09/06/2011 with pathology case number [id number] showing 1 the 24 lymph nodes in levels one 2 and 3 containing squamous cell carcinoma. He also has reresection of the right tongue which showed ulcerated squamous mucosa with underlying acute and chronic inflammation, granulation tissue formation and giant cell reaction without tumor seen. He was on observation until earlier  2014 he noticed and also in the right side of the tongue. On 03/23/2012, he underwent resection of the right tongue with pathology case number [id number] showed invasive well-differentiated squamous cell carcinoma, spanning 1.5 cm in greatest dimension. Margins were negative. There was no lymphovascular invasion. The tumor did invade into the underlying muscle. He underwent left neck dissection on 04/23/2012 with 2/34 positive nodes. He received adjuvant chemoradiation with q3 weeks cisplatin and daily radiation on 05/2012 and 06/2012.  INTERVAL HISTORY: [name] 32 y.o. male returns for further followup. He still have persistent dry mouth and altered taste sensation. A month ago, his feeding tube was removed as the patient was able to maintain his weight with 100% oral intake. Denies any pain in his mouth. No new lymphadenopathy. He denies any recent fever, chills, night sweats or abnormal weight loss  I have reviewed the past medical history, past surgical history, social  history and family history with the patient and they are unchanged from previous note.  ALLERGIES:  has No Known Allergies.  MEDICATIONS:  Current Outpatient Prescriptions  Medication Sig Dispense Refill  . sodium fluoride (FLUORISHIELD) 1.1 % GEL dental gel Instill one drop of gel per tooth space of fluoride tray. Place over teeth for 5 minutes. Remove. Spit out excess. Repeat nightly.  120 mL  prn  . pilocarpine (SALAGEN) 5 MG tablet Take 1 tablet (5 mg total) by mouth 3 (three) times daily.  90 tablet  1   No current facility-administered medications for this visit.    REVIEW OF SYSTEMS:   Constitutional: Denies fevers, chills or abnormal weight loss Eyes: Denies blurriness of vision Respiratory: Denies cough, dyspnea or wheezes Cardiovascular: Denies palpitation, chest discomfort or lower extremity swelling Gastrointestinal:  Denies nausea, heartburn or change in bowel habits Skin: Denies abnormal skin rashes Lymphatics: Denies new lymphadenopathy or easy bruising Neurological:Denies numbness, tingling or new weaknesses Behavioral/Psych: Mood is stable, no new changes  All other systems were reviewed with the patient and are negative.  PHYSICAL EXAMINATION: ECOG PERFORMANCE STATUS: 0 - Asymptomatic  Filed Vitals:   11/30/12 1141  BP: 115/69  Pulse: 78  Temp: 97.4 F (36.3 C)  Resp: 19   Filed Weights   11/30/12 1141  Weight: 227 lb 12.8 oz (103.329 kg)    GENERAL:alert, no distress and comfortable. Patient appear well-nourished, mildly obese SKIN: skin color, texture, turgor are normal, no rashes or significant lesions EYES: normal, Conjunctiva are pink and non-injected, sclera clear OROPHARYNX: No mucositis or thrush. Note a partial resection of the tongue. There is a very mild ulcer in the resection margin but appears like a canker sore  NECK: No well-healed surgical scar  with fullness at the scar area, likely represent benign tissue  LYMPH:  no palpable  lymphadenopathy in the cervical, axillary or inguinal LUNGS: clear to auscultation and percussion with normal breathing effort HEART: regular rate & rhythm and no murmurs and no lower extremity edema ABDOMEN:abdomen soft, non-tender and normal bowel sounds Musculoskeletal:no cyanosis of digits and no clubbing  NEURO: alert & oriented x 3 with fluent speech, no focal motor/sensory deficits  LABORATORY DATA:  I have reviewed the data as listed    Component Value Date/Time   NA 137 07/30/2012 0929   NA 142 04/22/2012 0830   K 4.3 07/30/2012 0929   K 4.0 04/22/2012 0830   CL 100 07/10/2012 1341   CL 104 04/22/2012 0830   CO2 32* 07/30/2012 0929   CO2 25 04/22/2012 0830   GLUCOSE 104 07/30/2012 0929   GLUCOSE 118* 07/10/2012 1341   GLUCOSE 90 04/22/2012 0830   BUN 22.3 07/30/2012 0929   BUN 11 04/22/2012 0830   CREATININE 1.2 07/30/2012 0929   CREATININE 0.80 04/22/2012 0830   CALCIUM 10.7* 07/30/2012 0929   CALCIUM 9.5 04/22/2012 0830   PROT 7.4 07/10/2012 1341   PROT 7.2 09/28/2008 1859   ALBUMIN 3.5 07/10/2012 1341   ALBUMIN 3.7 09/28/2008 1859   AST 11 07/10/2012 1341   AST 24 09/28/2008 1859   ALT 16 07/10/2012 1341   ALT 29 09/28/2008 1859   ALKPHOS 83 07/10/2012 1341   ALKPHOS 61 09/28/2008 1859   BILITOT 0.35 07/10/2012 1341   BILITOT 0.4 09/28/2008 1859   GFRNONAA >90 04/22/2012 0830   GFRAA >90 04/22/2012 0830    No results found for this basename: SPEP,  UPEP,   kappa and lambda light chains    Lab Results  Component Value Date   WBC 5.0 07/30/2012   NEUTROABS 3.9 07/30/2012   HGB 12.4* 07/30/2012   HCT 35.0* 07/30/2012   MCV 83.7 07/30/2012   PLT 297 07/30/2012      Chemistry      Component Value Date/Time   NA 137 07/30/2012 0929   NA 142 04/22/2012 0830   K 4.3 07/30/2012 0929   K 4.0 04/22/2012 0830   CL 100 07/10/2012 1341   CL 104 04/22/2012 0830   CO2 32* 07/30/2012 0929   CO2 25 04/22/2012 0830   BUN 22.3 07/30/2012 0929   BUN 11 04/22/2012 0830   CREATININE 1.2 07/30/2012 0929   CREATININE 0.80  04/22/2012 0830      Component Value Date/Time   CALCIUM 10.7* 07/30/2012 0929   CALCIUM 9.5 04/22/2012 0830   ALKPHOS 83 07/10/2012 1341   ALKPHOS 61 09/28/2008 1859   AST 11 07/10/2012 1341   AST 24 09/28/2008 1859   ALT 16 07/10/2012 1341   ALT 29 09/28/2008 1859   BILITOT 0.35 07/10/2012 1341   BILITOT 0.4 09/28/2008 1859     ASSESSMENT:  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 I will go ahead and order blood work and repeat PET/CT scan to ensure that he has complete response to treatment. If the PET scan is negative, I will touch base with his surgeon to have a repeat oral examination to look at that sore in his tongue.  #2 Malnutrition, resolved His feeding tube was removed. He is able to maintain his weight. #3 dry mouth The patient desire treatment for this. I discussed with  him some risk and benefit of [place]. I gave him patient education handout about this. #4 preventive care He has received influenza vaccination recently. Orders Placed This Encounter  Procedures  . NM PET Image Restag (PS) Skull Base To Thigh    Standing Status: Future     Number of Occurrences:      Standing Expiration Date: 11/30/2013    Order Specific Question:  Reason for exam:    Answer:  recurrent tongue cancer, assess reponse to Rx    Order Specific Question:  Preferred imaging location?    Answer:  [facility]  . Comprehensive metabolic panel    Standing Status: Future     Number of Occurrences:      Standing Expiration Date: 11/30/2013  . CBC with Differential    Standing Status: Future     Number of Occurrences:      Standing Expiration Date: 08/22/2013   All questions were answered. The patient knows to call the clinic with any problems, questions or concerns. No barriers to learning was detected.   Lawan Nanez, MD 11/30/2012 12:02 PM

## 2012-12-10 ENCOUNTER — Ambulatory Visit (HOSPITAL_COMMUNITY)
Admission: RE | Admit: 2012-12-10 | Discharge: 2012-12-10 | Disposition: A | Discharge status: Home / Self Care | Payer: 59 | Source: Ambulatory Visit | Attending: Hematology and Oncology | Admitting: Hematology and Oncology

## 2012-12-10 ENCOUNTER — Encounter (HOSPITAL_COMMUNITY): Payer: Self-pay

## 2012-12-10 DIAGNOSIS — N201 Calculus of ureter: Secondary | ICD-10-CM | POA: Insufficient documentation

## 2012-12-10 DIAGNOSIS — C029 Malignant neoplasm of tongue, unspecified: Secondary | ICD-10-CM | POA: Insufficient documentation

## 2012-12-10 DIAGNOSIS — N200 Calculus of kidney: Secondary | ICD-10-CM | POA: Insufficient documentation

## 2012-12-10 DIAGNOSIS — K117 Disturbances of salivary secretion: Secondary | ICD-10-CM

## 2012-12-10 LAB — GLUCOSE, CAPILLARY: Glucose-Capillary: 95 mg/dL (ref 70–99)

## 2012-12-10 MED ORDER — FLUDEOXYGLUCOSE F - 18 (FDG) INJECTION
18.3000 | Freq: Once | INTRAVENOUS | Status: AC | PRN
  Administered 2012-12-10: 18.3 via INTRAVENOUS

## 2012-12-15 ENCOUNTER — Encounter: Payer: Self-pay | Admitting: Hematology and Oncology

## 2012-12-15 ENCOUNTER — Ambulatory Visit (HOSPITAL_BASED_OUTPATIENT_CLINIC_OR_DEPARTMENT_OTHER): Payer: 59 | Admitting: Hematology and Oncology

## 2012-12-15 ENCOUNTER — Telehealth: Payer: Self-pay | Admitting: Hematology and Oncology

## 2012-12-15 VITALS — BP 128/78 | HR 67 | Temp 97.0°F | Resp 20 | Ht 74.0 in | Wt 228.7 lb

## 2012-12-15 DIAGNOSIS — K121 Other forms of stomatitis: Secondary | ICD-10-CM

## 2012-12-15 DIAGNOSIS — K117 Disturbances of salivary secretion: Secondary | ICD-10-CM

## 2012-12-15 DIAGNOSIS — C029 Malignant neoplasm of tongue, unspecified: Secondary | ICD-10-CM

## 2012-12-15 DIAGNOSIS — E039 Hypothyroidism, unspecified: Secondary | ICD-10-CM | POA: Insufficient documentation

## 2012-12-15 NOTE — Telephone Encounter (Signed)
gv and printed appt sched adn avs for pt for Feb 2015

## 2012-12-15 NOTE — Progress Notes (Signed)
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, T1 N2 M0  SUMMARY OF ONCOLOGIC HISTORY: Oncology History   Tongue cancer   Primary site: Lip and Oral Cavity (Right)   Staging method: AJCC 7th Edition   Clinical: Stage III (T1, N1, M0) signed by Artis Delay, MD on 12/15/2012 11:39 AM   Pathologic: Stage III (T1, N1, cM0) signed by Artis Delay, MD on 12/15/2012 11:39 AM   Summary: Stage III (T1, N1, cM0)       Tongue cancer   08/22/2011 Surgery He had partial glossectomy which showed invasive SCC measured 1.3 cm   09/06/2011 Surgery He underwent right neck dissection which showed 1/24 LN involved and repeat resection of tongue was negative   03/23/2012 Surgery Repeat tngue resection showed well differentiated SCC 1.5 cm which invaded into the muscle   04/17/2012 Imaging PET/CT scan showed New adenopathy in the left neck, compatible with recurrent malignancy   04/23/2012 Surgery he underwent left neck LN dissection and 2/34 LN were positve   06/01/2012 - 07/13/2012 Chemotherapy Patient had 3 cycles of cisplatin   06/12/2012 - 07/14/2012 Radiation Therapy Patient completed RT   12/10/2012 Imaging Interval resolution of previous hypermetabolic cervical adenopathy. There is nonspecific asymmetric increased uptake within the right side of tongue    INTERVAL HISTORY: [name] 32 y.o. male returns for review of test. Since the last time I saw him, we started on Salagen to help with his dry mouth. He did not find it helpful. He saw Dr. Lucky Rathke with repeat oral evaluation and was recommended observation  I have reviewed the past medical history, past surgical history, social history  and family history with the patient and they are unchanged from previous note.  ALLERGIES:  has No Known Allergies.  MEDICATIONS:  Current Outpatient Prescriptions  Medication Sig Dispense Refill  . pilocarpine (SALAGEN) 5 MG tablet Take 1 tablet (5 mg total) by mouth 3 (three) times daily.  90 tablet  1  . simvastatin (ZOCOR) 20 MG tablet Take 20 mg by mouth daily.      . sodium fluoride (FLUORISHIELD) 1.1 % GEL dental gel Instill one drop of gel per tooth space of fluoride tray. Place over teeth for 5 minutes. Remove. Spit out excess. Repeat nightly.  120 mL  prn   No current facility-administered medications for this visit.    REVIEW OF SYSTEMS:   Constitutional: Denies fevers, chills or abnormal weight loss All other systems were reviewed with the patient and are negative.  PHYSICAL EXAMINATION: ECOG PERFORMANCE STATUS: 0 - Asymptomatic  Filed Vitals:   12/15/12 0953  BP: 128/78  Pulse: 67  Temp: 97 F (36.1 C)  Resp: 20   Filed Weights   12/15/12 0953  Weight: 228 lb 11.2 oz (103.738 kg)    GENERAL:alert, no distress and comfortable NEURO: alert & oriented x 3 with fluent speech, no focal motor/sensory deficits  LABORATORY DATA:  I have reviewed the data as listed    Component Value Date/Time   NA 139 11/30/2012 1222   NA 142 04/22/2012 0830   K 4.3 11/30/2012 1222   K 4.0 04/22/2012 0830   CL 100 07/10/2012 1341   CL 104 04/22/2012 0830   CO2  26 11/30/2012 1222   CO2 25 04/22/2012 0830   GLUCOSE 91 11/30/2012 1222   GLUCOSE 118* 07/10/2012 1341   GLUCOSE 90 04/22/2012 0830   BUN 13.7 11/30/2012 1222   BUN 11 04/22/2012 0830   CREATININE 0.8 11/30/2012 1222   CREATININE 0.80 04/22/2012 0830   CALCIUM 9.7 11/30/2012 1222   CALCIUM 9.5 04/22/2012 0830   PROT 7.4 11/30/2012 1222   PROT 7.2 09/28/2008 1859   ALBUMIN 4.0 11/30/2012 1222   ALBUMIN 3.7 09/28/2008 1859   AST 20 11/30/2012 1222   AST 24 09/28/2008 1859   ALT 23 11/30/2012 1222   ALT 29 09/28/2008 1859   ALKPHOS 68  11/30/2012 1222   ALKPHOS 61 09/28/2008 1859   BILITOT 0.44 11/30/2012 1222   BILITOT 0.4 09/28/2008 1859   GFRNONAA >90 04/22/2012 0830   GFRAA >90 04/22/2012 0830    No results found for this basename: SPEP, UPEP,  kappa and lambda light chains    Lab Results  Component Value Date   WBC 6.1 11/30/2012   NEUTROABS 4.7 11/30/2012   HGB 13.4 11/30/2012   HCT 39.7 11/30/2012   MCV 89.7 11/30/2012   PLT 214 11/30/2012      Chemistry      Component Value Date/Time   NA 139 11/30/2012 1222   NA 142 04/22/2012 0830   K 4.3 11/30/2012 1222   K 4.0 04/22/2012 0830   CL 100 07/10/2012 1341   CL 104 04/22/2012 0830   CO2 26 11/30/2012 1222   CO2 25 04/22/2012 0830   BUN 13.7 11/30/2012 1222   BUN 11 04/22/2012 0830   CREATININE 0.8 11/30/2012 1222   CREATININE 0.80 04/22/2012 0830      Component Value Date/Time   CALCIUM 9.7 11/30/2012 1222   CALCIUM 9.5 04/22/2012 0830   ALKPHOS 68 11/30/2012 1222   ALKPHOS 61 09/28/2008 1859   AST 20 11/30/2012 1222   AST 24 09/28/2008 1859   ALT 23 11/30/2012 1222   ALT 29 09/28/2008 1859   BILITOT 0.44 11/30/2012 1222   BILITOT 0.4 09/28/2008 1859       RADIOGRAPHIC STUDIES: I have personally reviewed the radiological images as listed and agreed with the findings in the report. There is high uptake on the base of the tongue on PET/CT scan. I reviewed the imaging study with him and his wife.  ASSESSMENT & PLAN:  #1 recurrent squamous cell carcinoma of the tongue I am concerned about the sores in his mouth and the abnormal PET scan. The patient just completed his treatments 5 months ago. The patient is at high risk of cancer recurrence. I recommend repeating another PET scan in 3 months with repeat history, physical examination and blood work and he agreed. The patient asked about alcohol intake. I recommend he stay off alcohol due to recent cancer recurrence. I will talk to his ENT surgeon tomorrow about possible repeat biopsy of the tongue #2 dry mouth The patient  does not want to go on further treatment for this. We would continue conservative management.  Orders Placed This Encounter  Procedures  . NM PET Image Restag (PS) Skull Base To Thigh    Standing Status: Future     Number of Occurrences:      Standing Expiration Date: 02/14/2014    Order Specific Question:  Reason for Exam (SYMPTOM  OR DIAGNOSIS REQUIRED)    Answer:  Hx recurrent tongue cancer, restage to r/o recurrence    Order Specific Question:  Preferred  imaging location?    Answer:  [facility], [place]  . TSH    Standing Status: Future     Number of Occurrences:      Standing Expiration Date: 12/15/2013  . T4, free    Standing Status: Future     Number of Occurrences:      Standing Expiration Date: 12/15/2013  . CBC with Differential    Standing Status: Future     Number of Occurrences:      Standing Expiration Date: 09/06/2013  . Comprehensive metabolic panel    Standing Status: Future     Number of Occurrences:      Standing Expiration Date: 12/15/2013   All questions were answered. The patient knows to call the clinic with any problems, questions or concerns. No barriers to learning was detected.   Rula Keniston, MD 12/15/2012 11:55 AM

## 2013-01-06 ENCOUNTER — Telehealth: Payer: Self-pay | Admitting: Dietician

## 2013-01-06 NOTE — Telephone Encounter (Signed)
Brief Outpatient Oncology Nutrition Note  Patient has been identified to be at risk on malnutrition screen.  Wt Readings from Last 10 Encounters:  12/15/12 228 lb 11.2 oz (103.738 kg)  11/30/12 227 lb 12.8 oz (103.329 kg)  11/16/12 225 lb 12.8 oz (102.422 kg)  09/14/12 230 lb 8 oz (104.554 kg)  08/17/12 236 lb 3.2 oz (107.14 kg)  08/03/12 243 lb 9.6 oz (110.496 kg)  07/30/12 242 lb 4.8 oz (109.907 kg)  07/20/12 251 lb 12.8 oz (114.216 kg)  07/14/12 262 lb 9.6 oz (119.115 kg)  07/10/12 261 lb 1.6 oz (118.434 kg)    Called patient due to weight loss and recurrent tongue cancer.  [facility] 08[facility]17/12 and was on tube feeding at that time and small amounts of oral intake.    Patient was unavailable.  Left name and number for [facility] with recommendations to call for appointment or questions as needed.  Oran Rein, RD, LDN

## 2013-02-01 ENCOUNTER — Ambulatory Visit
Admission: RE | Admit: 2013-02-01 | Discharge: 2013-02-01 | Disposition: A | Discharge status: Home / Self Care | Payer: 59 | Source: Ambulatory Visit | Attending: Radiation Oncology | Admitting: Radiation Oncology

## 2013-02-01 ENCOUNTER — Encounter: Payer: Self-pay | Admitting: Radiation Oncology

## 2013-02-01 VITALS — BP 111/76 | HR 69 | Temp 97.6°F | Ht 74.0 in | Wt 226.4 lb

## 2013-02-01 DIAGNOSIS — C029 Malignant neoplasm of tongue, unspecified: Secondary | ICD-10-CM

## 2013-02-01 NOTE — Progress Notes (Signed)
  Radiation Oncology         [phone]) [phone] ________________________________  Name: [name] MRN: [id number]  Date: 02/01/2013  DOB: September 08, 1980  Follow-Up Visit Note  CC: [name], MD  [name], MD  Diagnosis:  Recurrent oral tongue cancer  Interval Since Last Radiation:  6 1/2  months  Narrative:  The patient returns today for routine follow-up.  He continues to show improvement. Patient is working full-time and tolerating this well. He continues to have xerostomia and uses a water bottle. Salagen was not helpful for him. Over the past few days he has noticed some soreness along the oral tongue and left buccal mucosa area. Patient did undergo PET scan recently which showed resolution of activity in the neck. It was question of activity along the right oral tongue. The patient has seen Dr. Constance Holster since this PET scan with no signs of recurrence on clinical exam.                              ALLERGIES:  has No Known Allergies.  Meds: Current Outpatient Prescriptions  Medication Sig Dispense Refill  . pilocarpine (SALAGEN) 5 MG tablet Take 1 tablet (5 mg total) by mouth 3 (three) times daily.  90 tablet  1  . simvastatin (ZOCOR) 20 MG tablet Take 20 mg by mouth daily.      . sodium fluoride (FLUORISHIELD) 1.1 % GEL dental gel Instill one drop of gel per tooth space of fluoride tray. Place over teeth for 5 minutes. Remove. Spit out excess. Repeat nightly.  120 mL  prn   No current facility-administered medications for this encounter.    Physical Findings: The patient is in no acute distress. Patient is alert and oriented.  height is 6\' 2"  (1.88 m) and weight is 226 lb 6.4 oz (102.694 kg). His temperature is 97.6 F (36.4 C). His blood pressure is 111/76 and his pulse is 69. Marland Kitchen  No palpable supraclavicular or axillary or cervical adenopathy. Examination of the oral cavity reveals some trismus. He  may have a very early candidiasis infection along the left buccal mucosa. I do  not detect any ulcer or infection along the oral tongue. Palpation along the floor of mouth and oral tongue region reveals surgical changes without obvious recurrence.  Lab Findings: Lab Results  Component Value Date   WBC 6.1 11/30/2012   HGB 13.4 11/30/2012   HCT 39.7 11/30/2012   MCV 89.7 11/30/2012   PLT 214 11/30/2012      Radiographic Findings: No results found.  Impression:  The patient is recovering from the effects of radiation.  No evidence of recurrence on clinical exam today. He was given Magic mouthwash with nystatin today.  Plan:  Routine followup in 3 months.  ____________________________________ Blair Promise, MD

## 2013-02-01 NOTE — Progress Notes (Signed)
Mr. Dauphine is here today as a FU s/p radiation therapy for cancer of the oral tongue.  Today he c/o soreness of his tongue and note irritation on the left ventral tongue and in the left, posterior, buccal mucosal region.  Also note whitish areas in the bilateral buccal mucosal regions.  He reports burning of his tingue when eating, but denies any pain upon swallowing.

## 2013-02-05 ENCOUNTER — Telehealth: Payer: Self-pay | Admitting: Dietician

## 2013-02-05 NOTE — Telephone Encounter (Signed)
Brief Outpatient Oncology Nutrition Note  Patient has been identified to be at risk on malnutrition screen.  Wt Readings from Last 10 Encounters:  02/01/13 226 lb 6.4 oz (102.694 kg)  12/15/12 228 lb 11.2 oz (103.738 kg)  11/30/12 227 lb 12.8 oz (103.329 kg)  11/16/12 225 lb 12.8 oz (102.422 kg)  09/14/12 230 lb 8 oz (104.554 kg)  08/17/12 236 lb 3.2 oz (107.14 kg)  08/03/12 243 lb 9.6 oz (110.496 kg)  07/30/12 242 lb 4.8 oz (109.907 kg)  07/20/12 251 lb 12.8 oz (114.216 kg)  07/14/12 262 lb 9.6 oz (119.115 kg)    Called patient due to weight loss and recurrent tongue cancer. Patient was last seen by the [facility] 08/17/12 and was on tube feeding at that time and small amounts of oral intake.  Patient was unavailable. Left name and number for [facility] with recommendations to call for appointment or questions as needed.  Antonieta Iba, RD, LDN

## 2013-03-13 ENCOUNTER — Encounter (HOSPITAL_COMMUNITY): Payer: Self-pay

## 2013-03-13 ENCOUNTER — Encounter (HOSPITAL_COMMUNITY)
Admission: RE | Admit: 2013-03-13 | Discharge: 2013-03-13 | Disposition: A | Discharge status: Home / Self Care | Payer: 59 | Source: Ambulatory Visit | Attending: Hematology and Oncology | Admitting: Hematology and Oncology

## 2013-03-13 DIAGNOSIS — N1330 Unspecified hydronephrosis: Secondary | ICD-10-CM | POA: Insufficient documentation

## 2013-03-13 DIAGNOSIS — K117 Disturbances of salivary secretion: Secondary | ICD-10-CM

## 2013-03-13 DIAGNOSIS — R682 Dry mouth, unspecified: Secondary | ICD-10-CM

## 2013-03-13 DIAGNOSIS — N200 Calculus of kidney: Secondary | ICD-10-CM | POA: Insufficient documentation

## 2013-03-13 DIAGNOSIS — E039 Hypothyroidism, unspecified: Secondary | ICD-10-CM

## 2013-03-13 DIAGNOSIS — C029 Malignant neoplasm of tongue, unspecified: Secondary | ICD-10-CM | POA: Insufficient documentation

## 2013-03-13 DIAGNOSIS — N201 Calculus of ureter: Secondary | ICD-10-CM | POA: Insufficient documentation

## 2013-03-13 LAB — GLUCOSE, CAPILLARY: GLUCOSE-CAPILLARY: 83 mg/dL (ref 70–99)

## 2013-03-13 MED ORDER — FLUDEOXYGLUCOSE F - 18 (FDG) INJECTION
11.6000 | Freq: Once | INTRAVENOUS | Status: AC | PRN
Start: 2013-03-13 — End: 2013-03-13
  Administered 2013-03-13: 11.6 via INTRAVENOUS

## 2013-03-15 ENCOUNTER — Telehealth: Payer: Self-pay | Admitting: Hematology and Oncology

## 2013-03-15 ENCOUNTER — Telehealth: Payer: Self-pay | Admitting: *Deleted

## 2013-03-15 ENCOUNTER — Encounter: Payer: Self-pay | Admitting: Hematology and Oncology

## 2013-03-15 ENCOUNTER — Other Ambulatory Visit: Payer: Self-pay | Admitting: Hematology and Oncology

## 2013-03-15 DIAGNOSIS — N200 Calculus of kidney: Secondary | ICD-10-CM | POA: Insufficient documentation

## 2013-03-15 NOTE — Telephone Encounter (Signed)
THE RESULTS WERE CALLED AND FAXED. THE REPORT WAS GIVEN TO DR.GORSUCH. 

## 2013-03-17 ENCOUNTER — Encounter (HOSPITAL_COMMUNITY): Payer: 59

## 2013-03-19 ENCOUNTER — Telehealth: Payer: Self-pay | Admitting: Hematology and Oncology

## 2013-03-19 ENCOUNTER — Encounter: Payer: Self-pay | Admitting: Hematology and Oncology

## 2013-03-19 ENCOUNTER — Ambulatory Visit (HOSPITAL_BASED_OUTPATIENT_CLINIC_OR_DEPARTMENT_OTHER): Payer: 59 | Admitting: Hematology and Oncology

## 2013-03-19 ENCOUNTER — Other Ambulatory Visit: Payer: Self-pay | Admitting: Hematology and Oncology

## 2013-03-19 ENCOUNTER — Telehealth: Payer: Self-pay | Admitting: *Deleted

## 2013-03-19 ENCOUNTER — Other Ambulatory Visit (HOSPITAL_BASED_OUTPATIENT_CLINIC_OR_DEPARTMENT_OTHER): Payer: 59

## 2013-03-19 VITALS — BP 124/81 | HR 80 | Temp 97.3°F | Resp 19 | Ht 74.0 in | Wt 231.2 lb

## 2013-03-19 DIAGNOSIS — C770 Secondary and unspecified malignant neoplasm of lymph nodes of head, face and neck: Secondary | ICD-10-CM

## 2013-03-19 DIAGNOSIS — K117 Disturbances of salivary secretion: Secondary | ICD-10-CM

## 2013-03-19 DIAGNOSIS — R682 Dry mouth, unspecified: Secondary | ICD-10-CM

## 2013-03-19 DIAGNOSIS — C029 Malignant neoplasm of tongue, unspecified: Secondary | ICD-10-CM

## 2013-03-19 DIAGNOSIS — E039 Hypothyroidism, unspecified: Secondary | ICD-10-CM

## 2013-03-19 LAB — T4, FREE: Free T4: 0.63 ng/dL — ABNORMAL LOW (ref 0.80–1.80)

## 2013-03-19 LAB — COMPREHENSIVE METABOLIC PANEL (CC13)
ALT: 37 U/L (ref 0–55)
ANION GAP: 10 meq/L (ref 3–11)
AST: 30 U/L (ref 5–34)
Albumin: 4.7 g/dL (ref 3.5–5.0)
Alkaline Phosphatase: 73 U/L (ref 40–150)
BUN: 13.8 mg/dL (ref 7.0–26.0)
CALCIUM: 10.1 mg/dL (ref 8.4–10.4)
CHLORIDE: 104 meq/L (ref 98–109)
CO2: 26 mEq/L (ref 22–29)
Creatinine: 1 mg/dL (ref 0.7–1.3)
Glucose: 96 mg/dl (ref 70–140)
Potassium: 4.5 mEq/L (ref 3.5–5.1)
Sodium: 140 mEq/L (ref 136–145)
Total Bilirubin: 0.64 mg/dL (ref 0.20–1.20)
Total Protein: 7.8 g/dL (ref 6.4–8.3)

## 2013-03-19 LAB — CBC WITH DIFFERENTIAL/PLATELET
BASO%: 1.1 % (ref 0.0–2.0)
BASOS ABS: 0.1 10*3/uL (ref 0.0–0.1)
EOS%: 4 % (ref 0.0–7.0)
Eosinophils Absolute: 0.2 10*3/uL (ref 0.0–0.5)
HEMATOCRIT: 43.3 % (ref 38.4–49.9)
HEMOGLOBIN: 14.5 g/dL (ref 13.0–17.1)
LYMPH#: 0.5 10*3/uL — AB (ref 0.9–3.3)
LYMPH%: 8.7 % — ABNORMAL LOW (ref 14.0–49.0)
MCH: 30.1 pg (ref 27.2–33.4)
MCHC: 33.6 g/dL (ref 32.0–36.0)
MCV: 89.8 fL (ref 79.3–98.0)
MONO#: 0.5 10*3/uL (ref 0.1–0.9)
MONO%: 8.3 % (ref 0.0–14.0)
NEUT#: 4.4 10*3/uL (ref 1.5–6.5)
NEUT%: 77.9 % — AB (ref 39.0–75.0)
PLATELETS: 218 10*3/uL (ref 140–400)
RBC: 4.82 10*6/uL (ref 4.20–5.82)
RDW: 13.2 % (ref 11.0–14.6)
WBC: 5.6 10*3/uL (ref 4.0–10.3)

## 2013-03-19 LAB — TSH CHCC: TSH: 2.023 m(IU)/L (ref 0.320–4.118)

## 2013-03-19 MED ORDER — LEVOTHYROXINE SODIUM 25 MCG PO TABS: 25.0000 ug | ORAL_TABLET | Freq: Every day | ORAL | Status: DC

## 2013-03-19 NOTE — Progress Notes (Signed)
[facility] OFFICE PROGRESS NOTE  Patient Care Team: Elsie Lincoln, MD as PCP - General (Family Medicine) [name], MD as Attending Physician (Otolaryngology) Blair Promise, MD as Attending Physician (Radiation Oncology) Heath Lark, MD as Consulting Physician (Hematology and Oncology)  DIAGNOSIS: Recurrent squamous cell carcinoma of the tongue, no evidence of disease  SUMMARY OF ONCOLOGIC HISTORY: Oncology History   Tongue cancer   Primary site: Lip and Oral Cavity (Right)   Staging method: AJCC 7th Edition   Clinical: Stage III (T1, N1, M0) signed by Heath Lark, MD on 12/15/2012 11:39 AM   Pathologic: Stage III (T1, N1, cM0) signed by Heath Lark, MD on 12/15/2012 11:39 AM   Summary: Stage III (T1, N1, cM0)  Tongue cancer, recurrence   Primary site: Lip and Oral Cavity (Right)   Staging method: AJCC 7th Edition   Clinical: Stage IVA (T1, N2c, M0) signed by Heath Lark, MD on 12/15/2012  1:13 PM   Pathologic: Stage IVA (T1, N2c, cM0) signed by Heath Lark, MD on 12/15/2012  1:14 PM   Summary: Stage IVA (T1, N2c, cM0)       Tongue cancer   08/22/2011 Surgery He had partial glossectomy which showed invasive SCC measured 1.3 cm   09/06/2011 Surgery He underwent right neck dissection which showed 1/24 LN involved and repeat resection of tongue was negative   03/23/2012 Surgery Repeat tngue resection showed well differentiated SCC 1.5 cm which invaded into the muscle   04/17/2012 Imaging PET/CT scan showed New adenopathy in the left neck, compatible with recurrent malignancy   04/23/2012 Surgery he underwent left neck LN dissection and 2/34 LN were positve   06/01/2012 - 07/13/2012 Chemotherapy Patient had 3 cycles of cisplatin   06/12/2012 - 07/14/2012 Radiation Therapy Patient completed RT   12/10/2012 Imaging Interval resolution of previous hypermetabolic cervical adenopathy. There is nonspecific asymmetric increased uptake within the right side of tongue   03/13/2013  Imaging PET/CT scan showed no evidence of disease recurrence. He was noted to have incidental kidney stones    INTERVAL HISTORY: [name] 33 y.o. male returns for further followup. He has been feeling well. Denies any new palpable lumps in his neck. He did not notice anything abnormal in his tongue. He continued to drink a lot of fluids for dry mouth. He denies any pain in his abdomen. Denies any recent hematuria  I have reviewed the past medical history, past surgical history, social history and family history with the patient and they are unchanged from previous note.  ALLERGIES:  has No Known Allergies.  MEDICATIONS:  Current Outpatient Prescriptions  Medication Sig Dispense Refill  . Alum & Mag Hydroxide-Simeth (MAGIC MOUTHWASH) SOLN Take 5 mLs by mouth 4 (four) times daily. 80 ml benadryl, 80 ml nystatin      . simvastatin (ZOCOR) 20 MG tablet Take 20 mg by mouth daily.      . sodium fluoride (FLUORISHIELD) 1.1 % GEL dental gel Instill one drop of gel per tooth space of fluoride tray. Place over teeth for 5 minutes. Remove. Spit out excess. Repeat nightly.  120 mL  prn   No current facility-administered medications for this visit.    REVIEW OF SYSTEMS:   Constitutional: Denies fevers, chills or abnormal weight loss Eyes: Denies blurriness of vision Respiratory: Denies cough, dyspnea or wheezes Cardiovascular: Denies palpitation, chest discomfort or lower extremity swelling Gastrointestinal:  Denies nausea, heartburn or change in bowel habits Skin: Denies abnormal skin rashes Lymphatics: Denies new  lymphadenopathy or easy bruising Neurological:Denies numbness, tingling or new weaknesses Behavioral/Psych: Mood is stable, no new changes  All other systems were reviewed with the patient and are negative.  PHYSICAL EXAMINATION: ECOG PERFORMANCE STATUS: 0 - Asymptomatic  Filed Vitals:   03/19/13 0904  BP: 124/81  Pulse: 80  Temp: 97.3 F (36.3 C)  Resp: 19   Filed  Weights   03/19/13 0904  Weight: 231 lb 3.2 oz (104.872 kg)    GENERAL:alert, no distress and comfortable SKIN: skin color, texture, turgor are normal, no rashes or significant lesions EYES: normal, Conjunctiva are pink and non-injected, sclera clear OROPHARYNX:no exudate, no erythema and lips, buccal mucosa, and evidence of surgery on his tongue but no abnormalities NECK: It feels hard secondary to fibrosis and surgery,  thyroid normal size, non-tender, without nodularity LYMPH:  no palpable lymphadenopathy in the cervical, axillary or inguinal LUNGS: clear to auscultation and percussion with normal breathing effort HEART: regular rate & rhythm and no murmurs and no lower extremity edema ABDOMEN:abdomen soft, non-tender and normal bowel sounds Musculoskeletal:no cyanosis of digits and no clubbing  NEURO: alert & oriented x 3 with fluent speech, no focal motor/sensory deficits  LABORATORY DATA:  I have reviewed the data as listed    Component Value Date/Time   NA 140 03/19/2013 0840   NA 142 04/22/2012 0830   K 4.5 03/19/2013 0840   K 4.0 04/22/2012 0830   CL 100 07/10/2012 1341   CL 104 04/22/2012 0830   CO2 26 03/19/2013 0840   CO2 25 04/22/2012 0830   GLUCOSE 96 03/19/2013 0840   GLUCOSE 118* 07/10/2012 1341   GLUCOSE 90 04/22/2012 0830   BUN 13.8 03/19/2013 0840   BUN 11 04/22/2012 0830   CREATININE 1.0 03/19/2013 0840   CREATININE 0.80 04/22/2012 0830   CALCIUM 10.1 03/19/2013 0840   CALCIUM 9.5 04/22/2012 0830   PROT 7.8 03/19/2013 0840   PROT 7.2 09/28/2008 1859   ALBUMIN 4.7 03/19/2013 0840   ALBUMIN 3.7 09/28/2008 1859   AST 30 03/19/2013 0840   AST 24 09/28/2008 1859   ALT 37 03/19/2013 0840   ALT 29 09/28/2008 1859   ALKPHOS 73 03/19/2013 0840   ALKPHOS 61 09/28/2008 1859   BILITOT 0.64 03/19/2013 0840   BILITOT 0.4 09/28/2008 1859   GFRNONAA >90 04/22/2012 0830   GFRAA >90 04/22/2012 0830    No results found for this basename: SPEP, UPEP,  kappa and lambda light chains    Lab Results   Component Value Date   WBC 5.6 03/19/2013   NEUTROABS 4.4 03/19/2013   HGB 14.5 03/19/2013   HCT 43.3 03/19/2013   MCV 89.8 03/19/2013   PLT 218 03/19/2013      Chemistry      Component Value Date/Time   NA 140 03/19/2013 0840   NA 142 04/22/2012 0830   K 4.5 03/19/2013 0840   K 4.0 04/22/2012 0830   CL 100 07/10/2012 1341   CL 104 04/22/2012 0830   CO2 26 03/19/2013 0840   CO2 25 04/22/2012 0830   BUN 13.8 03/19/2013 0840   BUN 11 04/22/2012 0830   CREATININE 1.0 03/19/2013 0840   CREATININE 0.80 04/22/2012 0830      Component Value Date/Time   CALCIUM 10.1 03/19/2013 0840   CALCIUM 9.5 04/22/2012 0830   ALKPHOS 73 03/19/2013 0840   ALKPHOS 61 09/28/2008 1859   AST 30 03/19/2013 0840   AST 24 09/28/2008 1859   ALT 37 03/19/2013 0840  ALT 29 09/28/2008 1859   BILITOT 0.64 03/19/2013 0840   BILITOT 0.4 09/28/2008 1859       RADIOGRAPHIC STUDIES: PET CT scan show evidence of hydronephrosis from kidney stones I have personally reviewed the radiological images as listed and agreed with the findings in the report.  ASSESSMENT & PLAN:  #1 recurrent tongue cancer Clinically has no evidence of recurrence. I will see him back in 6 months with history, physical examination, blood work and imaging study. The patient has stayed abstinent from alcohol and nicotine products #2 persistent dry mouth He will continue conservative management with plenty of liquids intake #3 obstructive kidney stones I have referred him to urologist for management.  Orders Placed This Encounter  Procedures  . CT Soft Tissue Neck W Contrast    Standing Status: Future     Number of Occurrences:      Standing Expiration Date: 06/19/2014    Order Specific Question:  Reason for Exam (SYMPTOM  OR DIAGNOSIS REQUIRED)    Answer:  Hx tongue ca, r.o recurrence    Order Specific Question:  Preferred imaging location?    Answer:  [facility]  . CBC with Differential    Standing Status: Future     Number of Occurrences:       Standing Expiration Date: 03/19/2014  . Comprehensive metabolic panel    Standing Status: Future     Number of Occurrences:      Standing Expiration Date: 03/19/2014  . T4, free    Standing Status: Future     Number of Occurrences:      Standing Expiration Date: 03/19/2014  . TSH    Standing Status: Future     Number of Occurrences:      Standing Expiration Date: 03/19/2014   All questions were answered. The patient knows to call the clinic with any problems, questions or concerns. No barriers to learning was detected. I spent 25 minutes counseling the patient face to face. The total time spent in the appointment was 40 minutes and more than 50% was on counseling and review of test results     [facility], [place], MD 03/19/2013 9:55 AM

## 2013-03-19 NOTE — Telephone Encounter (Signed)
Message copied by Cathlean Cower on Fri Mar 19, 2013  2:41 PM ------      Message from: [facility], [place]      Created: Fri Mar 19, 2013  2:23 PM      Regarding: low thyroid function       FYI i e-scribe levothyroxine to his pharmacy      Please let the patient know he has hypothyroidism, needs to take it indefinitely, best in the morning 30 minutes before breakfast      ----- Message -----         From: [facility]         Sent: 03/19/2013   8:51 AM           To: Heath Lark, MD                   ------

## 2013-03-19 NOTE — Telephone Encounter (Signed)
Informed pt of new Rx for levothyroxine to take daily 30 min before breakfast due to hypothyroidism.  He verbalized understanding.

## 2013-03-19 NOTE — Telephone Encounter (Signed)
gv adn printed appt sched anda vs for pt for Aug °

## 2013-03-24 ENCOUNTER — Telehealth: Payer: Self-pay | Admitting: *Deleted

## 2013-03-24 NOTE — Telephone Encounter (Signed)
Informed wife of good hand washing and for pt to contact PCP if he does start to have any symptoms of flu.  She verbalized understanding.

## 2013-03-24 NOTE — Telephone Encounter (Signed)
Wife called to report their son has the flu and asks if there is anything "Preventative"  Pt should take?

## 2013-03-24 NOTE — Telephone Encounter (Signed)
I do not believe so Just practice hand hygeine

## 2013-03-29 ENCOUNTER — Other Ambulatory Visit: Payer: Self-pay | Admitting: Urology

## 2013-03-30 ENCOUNTER — Telehealth: Payer: Self-pay | Admitting: Oncology

## 2013-03-30 NOTE — Telephone Encounter (Signed)
Called in per Dr. Sondra Come to [place] - fluconazole (DIFLUCAN) 100 MG tablet 03/30/2013 Sig - Route: Take 100 mg by mouth daily. Take 2 tablets on the first day then take 1 tablet daily for 6 days. Disp 8 tablets. 0 refills.  Left a message for Colonel to not take his simvastatin (zocor) while he is taking the Diflucan.

## 2013-04-08 ENCOUNTER — Encounter (HOSPITAL_COMMUNITY): Payer: Self-pay | Admitting: Pharmacy Technician

## 2013-04-12 ENCOUNTER — Encounter (HOSPITAL_COMMUNITY): Payer: Self-pay

## 2013-04-12 ENCOUNTER — Encounter (HOSPITAL_COMMUNITY)
Admission: RE | Admit: 2013-04-12 | Discharge: 2013-04-12 | Disposition: A | Discharge status: Home / Self Care | Payer: 59 | Source: Ambulatory Visit | Attending: Urology | Admitting: Urology

## 2013-04-12 DIAGNOSIS — Z01812 Encounter for preprocedural laboratory examination: Secondary | ICD-10-CM | POA: Insufficient documentation

## 2013-04-12 HISTORY — DX: Personal history of urinary calculi: Z87.442

## 2013-04-12 HISTORY — DX: Tinnitus, unspecified ear: H93.19

## 2013-04-12 HISTORY — DX: Hypothyroidism, unspecified: E03.9

## 2013-04-12 HISTORY — DX: Dry mouth, unspecified: R68.2

## 2013-04-12 LAB — CBC
HEMATOCRIT: 42.3 % (ref 39.0–52.0)
HEMOGLOBIN: 14.7 g/dL (ref 13.0–17.0)
MCH: 30.3 pg (ref 26.0–34.0)
MCHC: 34.8 g/dL (ref 30.0–36.0)
MCV: 87.2 fL (ref 78.0–100.0)
Platelets: 238 10*3/uL (ref 150–400)
RBC: 4.85 MIL/uL (ref 4.22–5.81)
RDW: 12.1 % (ref 11.5–15.5)
WBC: 5.6 10*3/uL (ref 4.0–10.5)

## 2013-04-12 LAB — BASIC METABOLIC PANEL
BUN: 15 mg/dL (ref 6–23)
CHLORIDE: 99 meq/L (ref 96–112)
CO2: 28 meq/L (ref 19–32)
CREATININE: 0.95 mg/dL (ref 0.50–1.35)
Calcium: 10 mg/dL (ref 8.4–10.5)
GFR calc Af Amer: >90 mL/min (ref >90)
GFR calc non Af Amer: >90 mL/min (ref >90)
GLUCOSE: 102 mg/dL — AB (ref 70–99)
POTASSIUM: 4.2 meq/L (ref 3.7–5.3)
Sodium: 139 mEq/L (ref 137–147)

## 2013-04-12 NOTE — Pre-Procedure Instructions (Signed)
Dr. Delma Post in for PAT visit as anesthesia consult

## 2013-04-12 NOTE — Patient Instructions (Addendum)
[name]  04/12/2013   Your procedure is scheduled on: 04/16/13   Report to [facility] at 10:30 AM.  Call this number if you have problems the morning of surgery [phone]: [phone]   Remember:   Do not eat food or drink liquids After Midnight.      Do not wear jewelry, make-up or nail polish.  Do not wear lotions, powders, or perfumes. You may wear deodorant.  Do not shave 48 hours prior to surgery. Men may shave face and neck.  Do not bring valuables to the hospital.     Patients discharged the day of surgery will not be allowed to drive home.  Name and phone number of your driver:[phone] Ria Comment     Please read over the following fact sheets that you were given: preparing for surgery sheet Paulette Blanch, RN  pre op nurse call if needed [phone]    FAILURE TO [place]   Patient Signature: ___________________________________________

## 2013-04-15 NOTE — H&P (Signed)
Urology History and Physical Exam  CC: Left ureter stone. Left nephrolithiasis.  HPI:  33 year old male presents today for left ureter stone.  This was discovered incidentally on a PET CT scan to/14/15.  He has a past medical history of tongue cancer.  He was having CT for workup of this.  This revealed a stone in the left ureter.  Is in the proximal ureter.  It measured 1.1 x 0.8 cm.  Density: 690 Hounsfield units.  There was also a left lower pole 2 mm stone and a punctate stone.  This is not associated with flank pain.  Nothing makes it better or worse.  Urine culture 04/08/13 was negative for growth.  He has been cleared by his ENT, Dr. [name], for surgery from the airway standpoint.  We have discussed management options along with risks, benefits, alternatives, and likelihood of achieving goals.  He presents today for cystoscopy, left ureteroscopy, laser lithotripsy, possible left retrograde pyelogram, possible left ureter stent placement.  PMH: Past Medical History  Diagnosis Date  . High triglycerides   . Gout   . Staph skin infection     Elbow- resolved.    . Head and neck cancer     tongue cancer  . Stones in the urinary tract   . GERD (gastroesophageal reflux disease)     occ  . History of radiation therapy 06/02/12- 07/14/12    oral tongue and bilateral neck, 60 gray in 30 fx  . Tongue cancer   . Xerostomia 11/30/2012  . PONV (postoperative nausea and vomiting)     tongue swelled-emergency trach  . Hypothyroidism     radiation- not working  . History of kidney stones   . Dry mouth     radiation  . Ringing in ears     PSH: Past Surgical History  Procedure Laterality Date  . Mouth surgery      biopsy- (08/06/2011 Dr. Hoyt Koch), also had tissue graft (oral) 2010  . Glossectomy    . Glossectomy  08/22/2011    Procedure: GLOSSECTOMY;  Surgeon: [name], MD;  Location: [place];  Service: ENT;  Laterality: Right;  WITH FROZEN SECTION  . Glossectomy  09/06/2011   Procedure: GLOSSECTOMY;  Surgeon: [name], MD;  Location[facility] OR;  Service: ENT;  Laterality: Right;  Right Tongue Re-Excision  . Radical neck dissection  09/06/2011    Procedure: RADICAL NECK DISSECTION;  Surgeon: [name], MD;  Location: [place];  Service: ENT;  Laterality: Right;  Right Modified Neck Dissection   . Hemiglossectomy N/A 03/23/2012    Procedure: BIOPSY OF TONGUE WITH FROZEN SECTION AND RIGHT HEMIGLOSSECTOMY;  Surgeon: [name], MD;  Location: [place];  Service: ENT;  Laterality: N/A;  . Tracheostomy tube placement N/A 03/23/2012    Procedure: TRACHEOSTOMY;  Surgeon: [name], MD;  Location: [place];  Service: ENT;  Laterality: N/A;  . Tongue biopsy    . Tracheostomy closure      14  . Gastrostomy w/ feeding tube  2/14  . Radical neck dissection Left 04/23/2012    Procedure: LEFT MODIFIED  NECK DISSECTION;  Surgeon: [name], MD;  Location: [facility] OR;  Service: ENT;  Laterality: Left;  Left Supra Omo Hyoid Neck Dissection     Allergies: No Known Allergies  Medications: No prescriptions prior to admission     Social History: History   Social History  . Marital Status: Married    Spouse Name: N/A    Number of Children: 1  .  Years of Education: N/A   Occupational History  .  [employer]    AC unit.    Social History Main Topics  . Smoking status: Former Smoker -- 15 years    Types: Cigars    Quit date: 08/14/2011  . Smokeless tobacco: Never Used     Comment: some day smoker before quitting  . Alcohol Use: No     Comment: quit drinking 7/13 from 12 pack of beeer on weekends  . Drug Use: No  . Sexual Activity: Yes   Other Topics Concern  . Not on file   Social History Narrative   Married.   History of smoking 5 small cigars daily but quit in 2013.   History of drinking approximately a 12 pack of beer on weekends but quit in July 2013.          Family History: Family History  Problem Relation Age of Onset  . Rheum arthritis Mother   .  Asthma Father   . Cancer Maternal Grandmother   . Diabetes Sister   . Diabetes Brother     Review of Systems: Positive: None. Negative: Chest pain, SOB, or fever.  A further 10 point review of systems was negative except what is listed in the HPI.  Physical Exam: Filed Vitals:   04/16/13 1042  BP: 142/78  Pulse: 74  Temp: 97.8 F (36.6 C)  Resp: 18    General: No acute distress.  Awake. Head:  Normocephalic.  Atraumatic. ENT:  EOMI.  Mucous membranes moist Neck:  Supple.  No lymphadenopathy. CV:  S1 present. S2 present. Regular rate. Pulmonary: Equal effort bilaterally.  Clear to auscultation bilaterally. Abdomen: Soft.  Non- tender to palpation. Skin:  Normal turgor.  No visible rash. Extremity: No gross deformity of bilateral upper extremities.  No gross deformity of    bilateral lower extremities. Neurologic: Alert. Appropriate mood.    Studies:  Recent Labs     04/12/13  1500  HGB  14.7  WBC  5.6  PLT  238    Recent Labs     04/12/13  1500  NA  139  K  4.2  CL  99  CO2  28  BUN  15  CREATININE  0.95  CALCIUM  10.0  GFRNONAA  >90  GFRAA  >90     No results found for this basename: PT, INR, APTT,  in the last 72 hours   No components found with this basename: ABG,     Assessment:  Left ureter stone. Left nephrolithiasis.  Plan: To OR for cystoscopy, left ureteroscopy, laser lithotripsy, possible left retrograde pyelogram, possible left ureter stent placement.

## 2013-04-16 ENCOUNTER — Encounter (HOSPITAL_COMMUNITY): Payer: 59 | Admitting: *Deleted

## 2013-04-16 ENCOUNTER — Ambulatory Visit (HOSPITAL_COMMUNITY)
Admission: RE | Admit: 2013-04-16 | Discharge: 2013-04-16 | Disposition: A | Discharge status: Home / Self Care | Payer: 59 | Source: Ambulatory Visit | Attending: Urology | Admitting: Urology

## 2013-04-16 ENCOUNTER — Encounter (HOSPITAL_COMMUNITY): Payer: Self-pay

## 2013-04-16 ENCOUNTER — Encounter (HOSPITAL_COMMUNITY)
Admission: RE | Disposition: A | Discharge status: Home / Self Care | Payer: Self-pay | Source: Ambulatory Visit | Attending: Urology

## 2013-04-16 ENCOUNTER — Ambulatory Visit (HOSPITAL_COMMUNITY): Payer: 59 | Admitting: *Deleted

## 2013-04-16 DIAGNOSIS — E039 Hypothyroidism, unspecified: Secondary | ICD-10-CM | POA: Insufficient documentation

## 2013-04-16 DIAGNOSIS — K219 Gastro-esophageal reflux disease without esophagitis: Secondary | ICD-10-CM | POA: Insufficient documentation

## 2013-04-16 DIAGNOSIS — Z87891 Personal history of nicotine dependence: Secondary | ICD-10-CM | POA: Insufficient documentation

## 2013-04-16 DIAGNOSIS — M109 Gout, unspecified: Secondary | ICD-10-CM | POA: Insufficient documentation

## 2013-04-16 DIAGNOSIS — N201 Calculus of ureter: Secondary | ICD-10-CM

## 2013-04-16 DIAGNOSIS — N200 Calculus of kidney: Secondary | ICD-10-CM

## 2013-04-16 DIAGNOSIS — Z85810 Personal history of malignant neoplasm of tongue: Secondary | ICD-10-CM | POA: Insufficient documentation

## 2013-04-16 HISTORY — PX: HOLMIUM LASER APPLICATION: SHX5852

## 2013-04-16 HISTORY — PX: CYSTOSCOPY WITH RETROGRADE PYELOGRAM, URETEROSCOPY AND STENT PLACEMENT: SHX5789

## 2013-04-16 SURGERY — CYSTOURETEROSCOPY, WITH RETROGRADE PYELOGRAM AND STENT INSERTION
Anesthesia: General | Laterality: Left

## 2013-04-16 MED ORDER — SODIUM CHLORIDE 0.9 % IR SOLN
Status: DC | PRN
  Administered 2013-04-16: 5000 mL

## 2013-04-16 MED ORDER — EPHEDRINE SULFATE 50 MG/ML IJ SOLN
INTRAMUSCULAR | Status: DC | PRN
  Administered 2013-04-16: 5 mg via INTRAVENOUS

## 2013-04-16 MED ORDER — DEXAMETHASONE SODIUM PHOSPHATE 10 MG/ML IJ SOLN
INTRAMUSCULAR | Status: AC
  Filled 2013-04-16: qty 1

## 2013-04-16 MED ORDER — LIDOCAINE HCL 2 % EX GEL
CUTANEOUS | Status: DC | PRN
  Administered 2013-04-16: 1

## 2013-04-16 MED ORDER — CEFAZOLIN SODIUM-DEXTROSE 2-3 GM-% IV SOLR
2.0000 g | INTRAVENOUS | Status: AC
  Administered 2013-04-16: 2 g via INTRAVENOUS

## 2013-04-16 MED ORDER — SODIUM CHLORIDE 0.9 % IR SOLN
Status: DC | PRN
  Administered 2013-04-16: 1000 mL

## 2013-04-16 MED ORDER — FENTANYL CITRATE 0.05 MG/ML IJ SOLN: 25.0000 ug | INTRAMUSCULAR | Status: DC | PRN

## 2013-04-16 MED ORDER — MEPERIDINE HCL 50 MG/ML IJ SOLN: 6.2500 mg | INTRAMUSCULAR | Status: DC | PRN

## 2013-04-16 MED ORDER — FENTANYL CITRATE 0.05 MG/ML IJ SOLN
INTRAMUSCULAR | Status: AC
  Filled 2013-04-16: qty 5

## 2013-04-16 MED ORDER — PROPOFOL 10 MG/ML IV BOLUS
INTRAVENOUS | Status: AC
  Filled 2013-04-16: qty 20

## 2013-04-16 MED ORDER — ONDANSETRON HCL 4 MG/2ML IJ SOLN
INTRAMUSCULAR | Status: AC
  Filled 2013-04-16: qty 2

## 2013-04-16 MED ORDER — TAMSULOSIN HCL 0.4 MG PO CAPS: 0.4000 mg | ORAL_CAPSULE | Freq: Every day | ORAL | Status: DC

## 2013-04-16 MED ORDER — KETAMINE HCL 10 MG/ML IJ SOLN
INTRAMUSCULAR | Status: AC
  Filled 2013-04-16: qty 1

## 2013-04-16 MED ORDER — OXYCODONE-ACETAMINOPHEN 5-325 MG PO TABS: 1.0000 | ORAL_TABLET | ORAL | Status: DC | PRN

## 2013-04-16 MED ORDER — IOHEXOL 350 MG/ML SOLN
INTRAVENOUS | Status: DC | PRN
  Administered 2013-04-16: 15 mL

## 2013-04-16 MED ORDER — PROMETHAZINE HCL 25 MG/ML IJ SOLN
6.2500 mg | INTRAMUSCULAR | Status: DC | PRN
  Administered 2013-04-16: 6.25 mg via INTRAVENOUS
  Filled 2013-04-16: qty 1

## 2013-04-16 MED ORDER — SENNOSIDES-DOCUSATE SODIUM 8.6-50 MG PO TABS: 1.0000 | ORAL_TABLET | Freq: Two times a day (BID) | ORAL | Status: DC

## 2013-04-16 MED ORDER — BELLADONNA ALKALOIDS-OPIUM 16.2-60 MG RE SUPP
RECTAL | Status: DC | PRN
  Administered 2013-04-16: 1 via RECTAL

## 2013-04-16 MED ORDER — OXYBUTYNIN CHLORIDE 5 MG PO TABS: 5.0000 mg | ORAL_TABLET | Freq: Four times a day (QID) | ORAL | Status: DC | PRN

## 2013-04-16 MED ORDER — PHENAZOPYRIDINE HCL 200 MG PO TABS: 200.0000 mg | ORAL_TABLET | Freq: Three times a day (TID) | ORAL | Status: DC | PRN

## 2013-04-16 MED ORDER — KETAMINE HCL 10 MG/ML IJ SOLN
INTRAMUSCULAR | Status: DC | PRN
  Administered 2013-04-16 (×2): 30 mg via INTRAVENOUS

## 2013-04-16 MED ORDER — PROPOFOL 10 MG/ML IV BOLUS
INTRAVENOUS | Status: DC | PRN
  Administered 2013-04-16: 300 mg via INTRAVENOUS

## 2013-04-16 MED ORDER — CEFAZOLIN SODIUM-DEXTROSE 2-3 GM-% IV SOLR
INTRAVENOUS | Status: AC
  Filled 2013-04-16: qty 50

## 2013-04-16 MED ORDER — LACTATED RINGERS IV SOLN: INTRAVENOUS | Status: DC

## 2013-04-16 MED ORDER — MIDAZOLAM HCL 5 MG/5ML IJ SOLN
INTRAMUSCULAR | Status: DC | PRN
  Administered 2013-04-16: 2 mg via INTRAVENOUS

## 2013-04-16 MED ORDER — BELLADONNA ALKALOIDS-OPIUM 16.2-60 MG RE SUPP
RECTAL | Status: AC
  Filled 2013-04-16: qty 1

## 2013-04-16 MED ORDER — EPHEDRINE SULFATE 50 MG/ML IJ SOLN
INTRAMUSCULAR | Status: AC
  Filled 2013-04-16: qty 1

## 2013-04-16 MED ORDER — SUCCINYLCHOLINE CHLORIDE 20 MG/ML IJ SOLN
INTRAMUSCULAR | Status: DC | PRN
  Administered 2013-04-16: 100 mg via INTRAVENOUS

## 2013-04-16 MED ORDER — LACTATED RINGERS IV SOLN
INTRAVENOUS | Status: DC
  Administered 2013-04-16: 1000 mL via INTRAVENOUS

## 2013-04-16 MED ORDER — MIDAZOLAM HCL 2 MG/2ML IJ SOLN
INTRAMUSCULAR | Status: AC
  Filled 2013-04-16: qty 2

## 2013-04-16 MED ORDER — FENTANYL CITRATE 0.05 MG/ML IJ SOLN
INTRAMUSCULAR | Status: DC | PRN
  Administered 2013-04-16: 100 ug via INTRAVENOUS

## 2013-04-16 MED ORDER — CEPHALEXIN 500 MG PO CAPS: 500.0000 mg | ORAL_CAPSULE | Freq: Three times a day (TID) | ORAL | Status: DC

## 2013-04-16 MED ORDER — LIDOCAINE HCL 2 % EX GEL
CUTANEOUS | Status: AC
  Filled 2013-04-16: qty 10

## 2013-04-16 SURGICAL SUPPLY — 29 items
BAG URO CATCHER STRL LF (DRAPE) ×2 IMPLANT
BASKET LASER NITINOL 1.9FR (BASKET) IMPLANT
BASKET STNLS GEMINI 4WIRE 3FR (BASKET) IMPLANT
BASKET ZERO TIP NITINOL 2.4FR (BASKET) IMPLANT
CATH CLEAR GEL 3F BACKSTOP (CATHETERS) ×2 IMPLANT
CATH URET 5FR 28IN CONE TIP (BALLOONS)
CATH URET 5FR 28IN OPEN ENDED (CATHETERS) IMPLANT
CATH URET 5FR 70CM CONE TIP (BALLOONS) IMPLANT
CATH URET DUAL LUMEN 6-10FR 50 (CATHETERS) IMPLANT
CLOTH BEACON ORANGE TIMEOUT ST (SAFETY) ×2 IMPLANT
DRAPE CAMERA CLOSED 9X96 (DRAPES) ×2 IMPLANT
EXTRACTOR STONE NITINOL NGAGE (UROLOGICAL SUPPLIES) ×2 IMPLANT
FIBER LASER FLEXIVA 200 (UROLOGICAL SUPPLIES) ×2 IMPLANT
FIBER LASER FLEXIVA 365 (UROLOGICAL SUPPLIES) IMPLANT
GLOVE BIOGEL M 7.0 STRL (GLOVE) ×2 IMPLANT
GOWN STRL REUS W/TWL LRG LVL3 (GOWN DISPOSABLE) ×2 IMPLANT
GOWN STRL REUS W/TWL XL LVL3 (GOWN DISPOSABLE) ×2 IMPLANT
GUIDEWIRE ANG ZIPWIRE 038X150 (WIRE) ×2 IMPLANT
GUIDEWIRE STR DUAL SENSOR (WIRE) ×2 IMPLANT
IV NS IRRIG 3000ML ARTHROMATIC (IV SOLUTION) ×2 IMPLANT
MANIFOLD NEPTUNE II (INSTRUMENTS) ×2 IMPLANT
PACK CYSTO (CUSTOM PROCEDURE TRAY) ×2 IMPLANT
SCRUB PCMX 4 OZ (MISCELLANEOUS) IMPLANT
SHEATH ACCESS URETERAL 38CM (SHEATH) ×2 IMPLANT
SHEATH URET ACCESS 12FR/35CM (UROLOGICAL SUPPLIES) IMPLANT
SHEATH URET ACCESS 12FR/55CM (UROLOGICAL SUPPLIES) IMPLANT
STENT POLARIS 5FRX28 (STENTS) ×2 IMPLANT
SYRINGE IRR TOOMEY STRL 70CC (SYRINGE) IMPLANT
TUBING CONNECTING 10 (TUBING) ×2 IMPLANT

## 2013-04-16 NOTE — Preoperative (Signed)
Beta Blockers   Reason not to administer Beta Blockers:Not Applicable, not on home BB 

## 2013-04-16 NOTE — Transfer of Care (Signed)
Immediate Anesthesia Transfer of Care Note  Patient: [name]  Procedure(s) Performed: Procedure(s): CYSTOSCOPY WITH RETROGRADE PYELOGRAM, URETEROSCOPY AND STENT PLACEMENT (Left) HOLMIUM LASER APPLICATION (Left)  Patient Location: PACU  Anesthesia Type:General  Level of Consciousness: sedated  Airway & Oxygen Therapy: Patient Spontanous Breathing and Patient connected to face mask oxygen  Post-op Assessment: Report given to PACU RN and Post -op Vital signs reviewed and stable  Post vital signs: Reviewed and stable  Complications: No apparent anesthesia complications

## 2013-04-16 NOTE — Discharge Instructions (Signed)
DISCHARGE INSTRUCTIONS FOR KIDNEY STONES OR URETERAL STENT  MEDICATIONS:   1.  Resume all your other meds from home.  ACTIVITY 1. No strenuous activity x 1week 2. No driving while on narcotic pain medications 3. Drink plenty of water 4. Continue to walk at home - you can still get blood clots when you are at home, so keep active, but don't over do it. 5. May return to work in 3 days.  BATHING 1. You can shower or take a bath.    SIGNS/SYMPTOMS TO CALL: 1. Please call us if you have a fever greater than 101.5, uncontrolled  nausea/vomiting, uncontrolled pain, dizziness, unable to urinate, chest pain, shortness of breath, leg swelling, leg pain, redness around wound, drainage from wound, or any other concerns or questions.  You can reach [place] at [phone].

## 2013-04-16 NOTE — Op Note (Signed)
Urology Operative Report  Date of Procedure: 04/16/13  Surgeon: Rolan Bucco, MD Assistant:  None  Preoperative Diagnosis: Left ureter stones. Left nephrolithiasis. Postoperative Diagnosis:  Same  Procedure(s): Left ureteroscopy with laser lithotripsy and stone removal (from ureter) with left ureter stent placement (5 x 28 polaris, no tether). Left ureteroscopy with stone removal from kidney. Left retrograde pyelogram with interpretation. Cystoscopy.  Estimated blood loss: Minimal  Specimen: Stones sent to [facility] lab for chemical analysis.  Drains: None  Complications: None  Findings: Impacted left ureter stone. Small left renal stones.  History of present illness: 33 year old male with incidental finding of large left ureter stones causing hydronephrosis which are asymptomatic. Also noted to have small left renal stones.   Procedure in detail: After informed consent was obtained, the patient was taken to the operating room. They were placed in the supine position. SCDs were turned on and in place. IV antibiotics were infused, and general anesthesia was induced. A timeout was performed in which the correct patient, surgical site, and procedure were identified and agreed upon by the team.  The patient was placed in a dorsolithotomy position, making sure to pad all pertinent neurovascular pressure points. The genitals were prepped and draped in the usual sterile fashion.  A rigid cystoscope was advanced through the urethra and into the bladder. The bladder was drained and then fully distended and evaluated in a systematic fashion to visualize the entire surface of the bladder. This was negative for tumors.  Attention was turned the left ureter orifice. Fluoroscopy was performed in the left proximal ureter stones were visualized. There appeared be 2 stones on film. Obtain a left retrograde pyelogram by inserting a 5 [place] ureter catheter into the left ureter orifice and injecting  10 cc of Omnipaque. There was a filling defect in the proximal ureter consistent with the 2 left ureter stones. These were able to pass beyond the stones and into the left renal pelvis which was dilated and the calyces were dilated as well. The calyces did not drain well due to the obstructing stones.  I then attempted to place a sensor wire up through the ureter beyond the stone, but this was not successful. I then placed a 5 French ureter catheter into the left ureter orifice and used an angle-tipped Glidewire. This required a large amount of manipulation but eventually I was able to pass this beyond the stone and into the left renal pelvis. I then placed a 5 French ureter catheter over the Glidewire up into the left renal pelvis and the Glidewire was removed. Sensor wire was placed through the 5 [place] ureter catheter and the catheter was removed. This was secured as a safety wire. A second sensor wires placed alongside this to the cystoscope and this was able to pass up into the left renal pelvis.  I then placed the internal obturator of the 12/14 ureter access sheath over the working wire under fluoroscopy. This was able to pass into the distal left ureter, but it would not go beyond this area. I then removed this in place the access sheath and obturator over the wire into the distal ureter. The obturator and working wire were removed.  I then navigated a flexible digital ureter scope through the access sheath and into the distal ureter. I was then able to navigate this up into the proximal ureter. There is noted to be areas of narrowing. Once in the proximal ureter as able to visualize the stone. This was noted to be  embedded in the ureter wall almost circumferentially.  I performed laser lithotripsy about passing a 200  holmium laser filament through the ureter scope to perform lithotripsy at a setting of 0.5 J and 20 Hz. I was able to break the stones up into small fragments most of them into small  dust that was able to pass on its own. The larger fragments were removed with a basket. I broke up the first stone and then as a (second stone is able to see beyond the second stone. I elected to place backstop gel to prevent migration of stones into the left kidney. This was deployed under direct visualization under fluoroscopy. I then carried out further lithotripsy until the stone was removed.  I then elected to terminate attention to his left renal stones which were known to exist on CT scan. I evaluated the kidney in a systematic fashion and noted 3 small renal stones in the lower pole the kidney. These were retrieved with the basket and removed from the patient's body.  I then withdrew the ureter scope and ureter access sheath visualizing the entire length of the ureter as it was withdrawn. There were several areas of abrasion of the mucosa where the stone had been located, but no disruption of the mucosa was noted. There was one area of concentric narrowing which was present just distal to the location of the stone.  Elected to place a ureter stent. The cystoscope was loaded over the safety wire and a 5 x 28 Polaris stent without tether was placed over the safety wire and deployed with a good curl in the left renal pelvis and the loops in the bladder.  The bladder was drained and the cystoscope was removed. I placed 10 cc of lidocaine jelly into the urethra and a belladonna and opium suppository to the rectum.  This completed the procedure. Anesthesia was reversed after he was placed in a supine position. He was taken to the PACU in stable condition.  All counts were correct at the end of the case.  He'll be discharged home with Keflex. I would recommend he keep the stent for 6 weeks. I will see him in clinic next week to see how he is doing with a stent and will determine the length of time from there. Stones were removed and sent to [facility] lab for chemical analysis.

## 2013-04-16 NOTE — Anesthesia Preprocedure Evaluation (Addendum)
Anesthesia Evaluation  Patient identified by MRN, date of birth, ID band Patient awake  General Assessment Comment:H/o R sided tongue cancer s/p resection and XRT  Reviewed: Allergy & Precautions, H&P , NPO status , Patient's Chart, lab work & pertinent test results  History of Anesthesia Complications (+) PONV  Airway Mallampati: IV TM Distance: >3 FB Neck ROM: Limited  Mouth opening: Limited Mouth Opening  Dental no notable dental hx. (+) Teeth Intact   Pulmonary neg pulmonary ROS, former smoker,  breath sounds clear to auscultation  Pulmonary exam normal       Cardiovascular negative cardio ROS  Rhythm:Regular Rate:Normal     Neuro/Psych negative neurological ROS  negative psych ROS   GI/Hepatic negative GI ROS, Neg liver ROS,   Endo/Other  negative endocrine ROS  Renal/GU negative Renal ROS  negative genitourinary   Musculoskeletal negative musculoskeletal ROS (+)   Abdominal   Peds negative pediatric ROS (+)  Hematology negative hematology ROS (+)   Anesthesia Other Findings   Reproductive/Obstetrics negative OB ROS                         Anesthesia Physical Anesthesia Plan  ASA: II  Anesthesia Plan: General   Post-op Pain Management:    Induction: Intravenous  Airway Management Planned: Oral ETT and Video Laryngoscope Planned  Additional Equipment:   Intra-op Plan:   Post-operative Plan: Extubation in OR  Informed Consent: I have reviewed the patients History and Physical, chart, labs and discussed the procedure including the risks, benefits and alternatives for the proposed anesthesia with the patient or authorized representative who has indicated his/her understanding and acceptance.   Dental advisory given  Plan Discussed with: CRNA  Anesthesia Plan Comments:        Anesthesia Quick Evaluation

## 2013-04-18 NOTE — Anesthesia Postprocedure Evaluation (Signed)
  Anesthesia Post-op Note  Patient: [name]  Procedure(s) Performed: Procedure(s) (LRB): CYSTOSCOPY WITH RETROGRADE PYELOGRAM, URETEROSCOPY AND STENT PLACEMENT (Left) HOLMIUM LASER APPLICATION (Left)  Patient Location: PACU  Anesthesia Type: General  Level of Consciousness: awake and alert   Airway and Oxygen Therapy: Patient Spontanous Breathing  Post-op Pain: mild  Post-op Assessment: Post-op Vital signs reviewed, Patient's Cardiovascular Status Stable, Respiratory Function Stable, Patent Airway and No signs of Nausea or vomiting  Last Vitals:  Filed Vitals:   04/16/13 1752  BP: 122/76  Pulse: 72  Temp: 36.4 C  Resp: 16    Post-op Vital Signs: stable   Complications: No apparent anesthesia complications

## 2013-04-19 ENCOUNTER — Encounter (HOSPITAL_COMMUNITY): Payer: Self-pay | Admitting: Urology

## 2013-05-06 ENCOUNTER — Telehealth: Payer: Self-pay | Admitting: Oncology

## 2013-05-06 ENCOUNTER — Encounter: Payer: Self-pay | Admitting: Radiation Oncology

## 2013-05-06 ENCOUNTER — Ambulatory Visit
Admission: RE | Admit: 2013-05-06 | Discharge: 2013-05-06 | Disposition: A | Discharge status: Home / Self Care | Payer: 59 | Source: Ambulatory Visit | Attending: Radiation Oncology | Admitting: Radiation Oncology

## 2013-05-06 VITALS — BP 116/67 | HR 64 | Temp 97.5°F | Ht 74.0 in | Wt 229.1 lb

## 2013-05-06 DIAGNOSIS — C023 Malignant neoplasm of anterior two-thirds of tongue, part unspecified: Secondary | ICD-10-CM

## 2013-05-06 MED ORDER — FLUCONAZOLE 100 MG PO TABS: 100.0000 mg | ORAL_TABLET | Freq: Every day | ORAL | Status: DC

## 2013-05-06 NOTE — Telephone Encounter (Signed)
Called [name] and advised him not to take simvastatin while taking the diflucan per Dr. Sondra Come.  Young verbalized agreement.

## 2013-05-06 NOTE — Progress Notes (Signed)
[name] here for follow up after treatment for tongue cancer.  He is reporting having pain that runs down both sides of his head to his shoulder.  He notices it when he is lifting something.  He said it lasts for a few minutes and just started happening a few days ago.  He also reports burning in his mouth when he eats certain foods and is concerned he has thrush.  He was on keflex recently for having kidney stones removed.  He reports a dry mouth and drinks water often.  He reports a poor appetite.  His weight is down 1 lb from when he finished in June.  He reports his energy level is good and is working the night shift at work.

## 2013-05-06 NOTE — Telephone Encounter (Signed)
Called in per Dr. Sondra Come to [facility] for fluconazole (DIFLUCAN) 100 MG tablets. Take 2 tablets by mouth the first day then 1 tablet by mouth daily for 6 days. Disp 8 tablets. O refills.

## 2013-05-06 NOTE — Progress Notes (Signed)
  Radiation Oncology         [phone]) [phone] ________________________________  Name: [name] MRN: [id number]  Date: 05/06/2013  DOB: 05/02/1980  Follow-Up Visit Note  CC: [name], MD  Elsie Lincoln, MD  Diagnosis:   Recurrent Oral Tongue cancer  Interval Since Last Radiation:  10  months  Narrative:  The patient returns today for routine follow-up.  He is doing reasonably well at this time. Interval history is significant for a PET scan in February showing no evidence recurrence but left kidney stone. He underwent surgery for this issue recently. Patient was placed on Keflex after his urologic procedure. He has had some burning and stinging in the oral cavity since completion of his Keflex.  He continues to followup on a regular basis with ear nose and throat and medical oncology. He denies any blood-tinged sputum. He is able to eat most any food with the assistance of water. The only food he is unable to eat is bread.   He occasionally will notice the sharp sensation in the bilateral neck area on lifting his child who weighs approximately 30 pounds. Patient will let us know if this continues to be an issue.  He is back to working full-time.                        ALLERGIES:  has No Known Allergies.  Meds: Current Outpatient Prescriptions  Medication Sig Dispense Refill  . levothyroxine (SYNTHROID, LEVOTHROID) 25 MCG tablet Take 25 mcg by mouth daily before breakfast.      . simvastatin (ZOCOR) 20 MG tablet Take 20 mg by mouth every evening.       Marland Kitchen oxybutynin (DITROPAN) 5 MG tablet Take 1 tablet (5 mg total) by mouth every 6 (six) hours as needed for bladder spasms.  40 tablet  4   No current facility-administered medications for this encounter.    Physical Findings: The patient is in no acute distress. Patient is alert and oriented.  height is 6\' 2"  (1.88 m) and weight is 229 lb 1.6 oz (103.919 kg). His temperature is 97.5 F (36.4 C). His blood pressure is 116/67 and  his pulse is 64. His oxygen saturation is 100%. .  The lungs are clear. The heart has a regular rhythm and rate. Examination of the neck area reveals bilateral neck dissection scars. No palpable adenopathy in the neck or supraclavicular region. The axillary areas are free of adenopathy. Summation of the oral cavity reveals mucosa to be somewhat dry. Patient does have trismus. Architectural distortion is noted along the right oral tongue area. No visible lesion. Careful palpation along the floor of mouth tongue and base of tongue region reveals no suspicious induration.  No definite signs of candidiasis.  Lab Findings: Lab Results  Component Value Date   WBC 5.6 04/12/2013   HGB 14.7 04/12/2013   HCT 42.3 04/12/2013   MCV 87.2 04/12/2013   PLT 238 04/12/2013      Radiographic Findings: No results found.  Impression:  No signs of recurrence on clinical exam today. Recent PET scan also showing no evidence of recurrence.  Concern is high for a candida infection in light of his symptoms and recent antibiotics  Plan:  Patient will be placed on Diflucan. Routine followup in November which will be 3 months after his ENT followup.  ____________________________________ Blair Promise, MD

## 2013-05-06 NOTE — Addendum Note (Signed)
Encounter addended by: Blair Promise, MD on: 05/06/2013 11:28 AM<BR>     Documentation filed: Medications, Orders

## 2013-07-12 ENCOUNTER — Telehealth: Payer: Self-pay | Admitting: Oncology

## 2013-07-12 NOTE — Telephone Encounter (Signed)
[name] called and said that he has been having shooting pains that start at his ears and run down to his shoulder blade.  He said Dr. Sondra Come wanted him to let us know if he was having them more often.  [name] said he had the pain three times on Saturday and they lasted for 30 seconds to a minute each time.

## 2013-07-26 ENCOUNTER — Ambulatory Visit
Admission: RE | Admit: 2013-07-26 | Discharge: 2013-07-26 | Disposition: A | Discharge status: Home / Self Care | Payer: 59 | Source: Ambulatory Visit | Attending: Radiation Oncology | Admitting: Radiation Oncology

## 2013-07-26 ENCOUNTER — Encounter: Payer: Self-pay | Admitting: Radiation Oncology

## 2013-07-26 VITALS — BP 109/69 | HR 71 | Temp 97.8°F | Resp 16 | Ht 74.0 in | Wt 219.3 lb

## 2013-07-26 DIAGNOSIS — C029 Malignant neoplasm of tongue, unspecified: Secondary | ICD-10-CM

## 2013-07-26 NOTE — Progress Notes (Signed)
Radiation Oncology         [phone]) [phone] ________________________________  Name: [name] MRN: [id number]  Date: 07/26/2013  DOB: 1981/01/26  Follow-Up Visit Note  CC: [name], MD  [name], MD  Diagnosis:  Recurrent oral tongue cancer  Interval Since Last Radiation:  12  months  Narrative:  The patient asked to be seen earlier than his scheduled followup appointment. Over the past 3-4 weeks patient's noticed sharp shooting pains on both sides this neck. These issues last for approximately 20-30 seconds. He had approximately 2-3 of these last week. Patient denies any consistent pain,  swallowing difficulties or pain with swallowing. He denies any unusual feeling within his tongue region. He continues to work full-time and has taken up [employer] for exercise. He denies any symptoms of lermitte's syndrome.   he continues have a dry mouth. He denies any cough or breathing problems.                              ALLERGIES:  has No Known Allergies.  Meds: Current Outpatient Prescriptions  Medication Sig Dispense Refill  . levothyroxine (SYNTHROID, LEVOTHROID) 25 MCG tablet Take 25 mcg by mouth daily before breakfast.      . simvastatin (ZOCOR) 20 MG tablet Take 20 mg by mouth every evening.       . fluconazole (DIFLUCAN) 100 MG tablet Take 1 tablet (100 mg total) by mouth daily. Take 2 tablets by mouth the first day then 1 tablet by mouth daily for 6 days.  Disp 8 tablets.  O refills.  8 tablet  0  . oxybutynin (DITROPAN) 5 MG tablet Take 1 tablet (5 mg total) by mouth every 6 (six) hours as needed for bladder spasms.  40 tablet  4   No current facility-administered medications for this encounter.    Physical Findings: The patient is in no acute distress. Patient is alert and oriented.  height is 6\' 2"  (1.88 m) and weight is 219 lb 4.8 oz (99.474 kg). His oral temperature is 97.8 F (36.6 C). His blood pressure is 109/69 and his pulse is 71. His respiration is  16 and oxygen saturation is 99%. . No palpable cervical supraclavicular or axillary adenopathy. The patient's bilateral neck scars are well healed. The lungs are clear. The heart has a regular rhythm and rate. Examination of oral cavity reveals signs of some trismus. Patient seems to be doing better as far as this issue is concerned. The right side of the oral tongue shows surgical changes without palpable or visible signs recurrence. No mucosal lesions are noted in the oral cavity. Patient has some mild radiation changes noted along the soft palate region and pharyngeal area.  Lab Findings: Lab Results  Component Value Date   WBC 5.6 04/12/2013   HGB 14.7 04/12/2013   HCT 42.3 04/12/2013   MCV 87.2 04/12/2013   PLT 238 04/12/2013      Radiographic Findings: No results found.  Impression:  No evidence of recurrence on clinical exam today. I am unsure of the etiology of the patient's presenting symptoms today. This may be possibly nerve related from his radiation and neck dissection.   Plan:  Patient will return for routine followup in 3 months. He will undergo repeat scanning in August and will be seen by medical oncology at that time. Patient continues regular followup ENT. If  his symptoms become more significant over the next few weeks,  I would move up imaging studies.    ____________________________________ Blair Promise, MD

## 2013-07-26 NOTE — Progress Notes (Signed)
[name] here for follow up after treatment to his tongue.  He denies pain today.  He does report sharp pains that run down either side of his neck that last for 30-40 seconds.  The last one happened on Saturday morning when he was at work.  He reports that his mouth is dry and that sweet foods taste "funny" to him.  He has lost 10 lbs since April.  He said that he is taking a cardio kickboxing class 4 times a week and is eating health with a lot of eggs and salads.  He denies trouble swallowing.  He denies fatigue.  His oral mucosa is intact.

## 2013-08-31 ENCOUNTER — Telehealth: Payer: Self-pay | Admitting: Dietician

## 2013-08-31 NOTE — Telephone Encounter (Signed)
Brief Outpatient Oncology Nutrition Note  Patient has been identified to be at risk on malnutrition screen.  Wt Readings from Last 10 Encounters:  07/26/13 219 lb 4.8 oz (99.474 kg)  05/06/13 229 lb 1.6 oz (103.919 kg)  04/12/13 226 lb (102.513 kg)  03/19/13 231 lb 3.2 oz (104.872 kg)  02/01/13 226 lb 6.4 oz (102.694 kg)  12/15/12 228 lb 11.2 oz (103.738 kg)  11/30/12 227 lb 12.8 oz (103.329 kg)  11/16/12 225 lb 12.8 oz (102.422 kg)  09/14/12 230 lb 8 oz (104.554 kg)  08/17/12 236 lb 3.2 oz (107.14 kg)      Dx: Tongue cancer.   Patient of Dr. Alvy Bimler.  Called patient due to weight loss.  16 lbs (7%) in the past year.  Per chart, patient has been taking a kickboxing class 4 times per week and eating a lot of salads and eggs.  Patient was not available.  Contact information for the [facility] provided.  Antonieta Iba, RD, LDN

## 2013-09-06 ENCOUNTER — Encounter (HOSPITAL_COMMUNITY): Payer: Self-pay

## 2013-09-06 ENCOUNTER — Ambulatory Visit (HOSPITAL_COMMUNITY)
Admission: RE | Admit: 2013-09-06 | Discharge: 2013-09-06 | Disposition: A | Discharge status: Home / Self Care | Payer: 59 | Source: Ambulatory Visit | Attending: Hematology and Oncology | Admitting: Hematology and Oncology

## 2013-09-06 ENCOUNTER — Other Ambulatory Visit (HOSPITAL_BASED_OUTPATIENT_CLINIC_OR_DEPARTMENT_OTHER): Payer: 59

## 2013-09-06 DIAGNOSIS — J338 Other polyp of sinus: Secondary | ICD-10-CM | POA: Insufficient documentation

## 2013-09-06 DIAGNOSIS — C029 Malignant neoplasm of tongue, unspecified: Secondary | ICD-10-CM | POA: Insufficient documentation

## 2013-09-06 DIAGNOSIS — C779 Secondary and unspecified malignant neoplasm of lymph node, unspecified: Secondary | ICD-10-CM | POA: Diagnosis not present

## 2013-09-06 DIAGNOSIS — K118 Other diseases of salivary glands: Secondary | ICD-10-CM | POA: Diagnosis not present

## 2013-09-06 LAB — CBC WITH DIFFERENTIAL/PLATELET
BASO%: 2.1 % — ABNORMAL HIGH (ref 0.0–2.0)
Basophils Absolute: 0.1 10*3/uL (ref 0.0–0.1)
EOS ABS: 0.3 10*3/uL (ref 0.0–0.5)
EOS%: 4.8 % (ref 0.0–7.0)
HCT: 42.7 % (ref 38.4–49.9)
HGB: 14.3 g/dL (ref 13.0–17.1)
LYMPH%: 10.3 % — AB (ref 14.0–49.0)
MCH: 29.1 pg (ref 27.2–33.4)
MCHC: 33.5 g/dL (ref 32.0–36.0)
MCV: 86.9 fL (ref 79.3–98.0)
MONO#: 0.5 10*3/uL (ref 0.1–0.9)
MONO%: 8.1 % (ref 0.0–14.0)
NEUT#: 4.1 10*3/uL (ref 1.5–6.5)
NEUT%: 74.7 % (ref 39.0–75.0)
PLATELETS: 232 10*3/uL (ref 140–400)
RBC: 4.91 10*6/uL (ref 4.20–5.82)
RDW: 13.1 % (ref 11.0–14.6)
WBC: 5.5 10*3/uL (ref 4.0–10.3)
lymph#: 0.6 10*3/uL — ABNORMAL LOW (ref 0.9–3.3)

## 2013-09-06 LAB — TSH: TSH: 1.867 u[IU]/mL (ref 0.350–4.500)

## 2013-09-06 LAB — T4, FREE: FREE T4: 1.02 ng/dL (ref 0.80–1.80)

## 2013-09-06 MED ORDER — IOHEXOL 300 MG/ML  SOLN
100.0000 mL | Freq: Once | INTRAMUSCULAR | Status: AC | PRN
  Administered 2013-09-06: 100 mL via INTRAVENOUS

## 2013-09-13 ENCOUNTER — Telehealth: Payer: Self-pay | Admitting: Hematology and Oncology

## 2013-09-13 ENCOUNTER — Encounter: Payer: Self-pay | Admitting: Hematology and Oncology

## 2013-09-13 ENCOUNTER — Telehealth: Payer: Self-pay | Admitting: *Deleted

## 2013-09-13 ENCOUNTER — Ambulatory Visit (HOSPITAL_BASED_OUTPATIENT_CLINIC_OR_DEPARTMENT_OTHER): Payer: 59 | Admitting: Hematology and Oncology

## 2013-09-13 VITALS — BP 144/76 | HR 83 | Temp 97.8°F | Resp 18 | Ht 74.0 in | Wt 215.6 lb

## 2013-09-13 DIAGNOSIS — K117 Disturbances of salivary secretion: Secondary | ICD-10-CM

## 2013-09-13 DIAGNOSIS — E038 Other specified hypothyroidism: Secondary | ICD-10-CM

## 2013-09-13 DIAGNOSIS — M542 Cervicalgia: Secondary | ICD-10-CM

## 2013-09-13 DIAGNOSIS — C029 Malignant neoplasm of tongue, unspecified: Secondary | ICD-10-CM

## 2013-09-13 DIAGNOSIS — E039 Hypothyroidism, unspecified: Secondary | ICD-10-CM

## 2013-09-13 DIAGNOSIS — C770 Secondary and unspecified malignant neoplasm of lymph nodes of head, face and neck: Secondary | ICD-10-CM

## 2013-09-13 NOTE — Telephone Encounter (Signed)
Pt asks purpose of his appt today.  Informed him 6 month f/u w/ Dr. Alvy Bimler.  He verbalized understanding and plans to keep appt today as scheduled.

## 2013-09-13 NOTE — Assessment & Plan Note (Signed)
I suspect this is related to prior surgery. I recommend a trial of medication to treat this but the patient declined.

## 2013-09-13 NOTE — Progress Notes (Signed)
[name] OFFICE PROGRESS NOTE  Patient Care Team: Elsie Lincoln, MD as PCP - General (Family Medicine) [name], MD as Attending Physician (Otolaryngology) Blair Promise, MD as Attending Physician (Radiation Oncology) Heath Lark, MD as Consulting Physician (Hematology and Oncology)  SUMMARY OF ONCOLOGIC HISTORY: Oncology History   Tongue cancer   Primary site: Lip and Oral Cavity (Right)   Staging method: AJCC 7th Edition   Clinical: Stage III (T1, N1, M0) signed by Heath Lark, MD on 12/15/2012 11:39 AM   Pathologic: Stage III (T1, N1, cM0) signed by Heath Lark, MD on 12/15/2012 11:39 AM   Summary: Stage III (T1, N1, cM0)  Tongue cancer, recurrence   Primary site: Lip and Oral Cavity (Right)   Staging method: AJCC 7th Edition   Clinical: Stage IVA (T1, N2c, M0) signed by Heath Lark, MD on 12/15/2012  1:13 PM   Pathologic: Stage IVA (T1, N2c, cM0) signed by Heath Lark, MD on 12/15/2012  1:14 PM   Summary: Stage IVA (T1, N2c, cM0)       Tongue cancer   08/22/2011 Surgery He had partial glossectomy which showed invasive SCC measured 1.3 cm   09/06/2011 Surgery He underwent right neck dissection which showed 1/24 LN involved and repeat resection of tongue was negative   03/23/2012 Surgery Repeat tngue resection showed well differentiated SCC 1.5 cm which invaded into the muscle   04/17/2012 Imaging PET/CT scan showed New adenopathy in the left neck, compatible with recurrent malignancy   04/23/2012 Surgery he underwent left neck LN dissection and 2/34 LN were positve   06/01/2012 - 07/13/2012 Chemotherapy Patient had 3 cycles of cisplatin   06/12/2012 - 07/14/2012 Radiation Therapy Patient completed RT   12/10/2012 Imaging Interval resolution of previous hypermetabolic cervical adenopathy. There is nonspecific asymmetric increased uptake within the right side of tongue   03/13/2013 Imaging PET/CT scan showed no evidence of disease recurrence. He was noted to have  incidental kidney stones    INTERVAL HISTORY: Please see below for problem oriented charting. He complained of mild neck pain that comes and goes. It is not severe or limiting his activities. He have persistent dry mouth. He denies any new lump in his neck.  REVIEW OF SYSTEMS:   Constitutional: Denies fevers, chills or abnormal weight loss Eyes: Denies blurriness of vision Ears, nose, mouth, throat, and face: Denies mucositis or sore throat Respiratory: Denies cough, dyspnea or wheezes Cardiovascular: Denies palpitation, chest discomfort or lower extremity swelling Gastrointestinal:  Denies nausea, heartburn or change in bowel habits Skin: Denies abnormal skin rashes Lymphatics: Denies new lymphadenopathy or easy bruising Neurological:Denies numbness, tingling or new weaknesses Behavioral/Psych: Mood is stable, no new changes  All other systems were reviewed with the patient and are negative.  I have reviewed the past medical history, past surgical history, social history and family history with the patient and they are unchanged from previous note.  ALLERGIES:  has No Known Allergies.  MEDICATIONS:  Current Outpatient Prescriptions  Medication Sig Dispense Refill  . levothyroxine (SYNTHROID, LEVOTHROID) 25 MCG tablet Take 25 mcg by mouth daily before breakfast.      . simvastatin (ZOCOR) 20 MG tablet Take 20 mg by mouth every evening.        No current facility-administered medications for this visit.    PHYSICAL EXAMINATION: ECOG PERFORMANCE STATUS: 1 - Symptomatic but completely ambulatory  Filed Vitals:   09/13/13 1148  BP: 144/76  Pulse: 83  Temp: 97.8 F (36.6 C)  Resp:  18   Filed Weights   09/13/13 1148  Weight: 215 lb 9.6 oz (97.796 kg)    GENERAL:alert, no distress and comfortable SKIN: skin color, texture, turgor are normal, no rashes or significant lesions EYES: normal, Conjunctiva are pink and non-injected, sclera clear OROPHARYNX: Noted postsurgical  changes to his tongue. No abnormalities NECK: supple, thyroid normal size, non-tender, without nodularity. Well-healed surgical scar LYMPH:  no palpable lymphadenopathy in the cervical, axillary or inguinal LUNGS: clear to auscultation and percussion with normal breathing effort HEART: regular rate & rhythm and no murmurs and no lower extremity edema ABDOMEN:abdomen soft, non-tender and normal bowel sounds Musculoskeletal:no cyanosis of digits and no clubbing  NEURO: alert & oriented x 3 with fluent speech, no focal motor/sensory deficits  LABORATORY DATA:  I have reviewed the data as listed    Component Value Date/Time   NA 139 04/12/2013 1500   NA 140 03/19/2013 0840   K 4.2 04/12/2013 1500   K 4.5 03/19/2013 0840   CL 99 04/12/2013 1500   CL 100 07/10/2012 1341   CO2 28 04/12/2013 1500   CO2 26 03/19/2013 0840   GLUCOSE 102* 04/12/2013 1500   GLUCOSE 96 03/19/2013 0840   GLUCOSE 118* 07/10/2012 1341   BUN 15 04/12/2013 1500   BUN 13.8 03/19/2013 0840   CREATININE 0.95 04/12/2013 1500   CREATININE 1.0 03/19/2013 0840   CALCIUM 10.0 04/12/2013 1500   CALCIUM 10.1 03/19/2013 0840   PROT 7.8 03/19/2013 0840   PROT 7.2 09/28/2008 1859   ALBUMIN 4.7 03/19/2013 0840   ALBUMIN 3.7 09/28/2008 1859   AST 30 03/19/2013 0840   AST 24 09/28/2008 1859   ALT 37 03/19/2013 0840   ALT 29 09/28/2008 1859   ALKPHOS 73 03/19/2013 0840   ALKPHOS 61 09/28/2008 1859   BILITOT 0.64 03/19/2013 0840   BILITOT 0.4 09/28/2008 1859   GFRNONAA >90 04/12/2013 1500   GFRAA >90 04/12/2013 1500    No results found for this basename: SPEP, UPEP,  kappa and lambda light chains    Lab Results  Component Value Date   WBC 5.5 09/06/2013   NEUTROABS 4.1 09/06/2013   HGB 14.3 09/06/2013   HCT 42.7 09/06/2013   MCV 86.9 09/06/2013   PLT 232 09/06/2013      Chemistry      Component Value Date/Time   NA 139 04/12/2013 1500   NA 140 03/19/2013 0840   K 4.2 04/12/2013 1500   K 4.5 03/19/2013 0840   CL 99 04/12/2013 1500   CL 100 07/10/2012  1341   CO2 28 04/12/2013 1500   CO2 26 03/19/2013 0840   BUN 15 04/12/2013 1500   BUN 13.8 03/19/2013 0840   CREATININE 0.95 04/12/2013 1500   CREATININE 1.0 03/19/2013 0840      Component Value Date/Time   CALCIUM 10.0 04/12/2013 1500   CALCIUM 10.1 03/19/2013 0840   ALKPHOS 73 03/19/2013 0840   ALKPHOS 61 09/28/2008 1859   AST 30 03/19/2013 0840   AST 24 09/28/2008 1859   ALT 37 03/19/2013 0840   ALT 29 09/28/2008 1859   BILITOT 0.64 03/19/2013 0840   BILITOT 0.4 09/28/2008 1859     ASSESSMENT & PLAN:  Tongue cancer He has no signs and symptoms of recurrence. I recommend history, physical examination blood protein 6 months in today for imaging studies until one year from now and he agreed. I reinforced the importance of surveillance program by the radiation oncologist at [facility].  Hypothyroid He  is not taking his thyroid supplements consistently. I reinforced the importance of consistent thyroid supplement.  Neck pain I suspect this is related to prior surgery. I recommend a trial of medication to treat this but the patient declined.   Orders Placed This Encounter  Procedures  . Comprehensive metabolic panel    Standing Status: Future     Number of Occurrences:      Standing Expiration Date: 10/18/2014  . CBC with Differential    Standing Status: Future     Number of Occurrences:      Standing Expiration Date: 10/18/2014  . TSH    Standing Status: Future     Number of Occurrences:      Standing Expiration Date: 10/18/2014  . T4, free    Standing Status: Future     Number of Occurrences:      Standing Expiration Date: 10/18/2014   All questions were answered. The patient knows to call the clinic with any problems, questions or concerns. No barriers to learning was detected. I spent 15 minutes counseling the patient face to face. The total time spent in the appointment was 20 minutes and more than 50% was on counseling and review of test results     [facility], [place], MD 09/13/2013  8:20 PM

## 2013-09-13 NOTE — Telephone Encounter (Signed)
Pt confirmed labs/ov per 08/17 POF, gave pt AVS.....KJ °

## 2013-09-13 NOTE — Assessment & Plan Note (Signed)
He has no signs and symptoms of recurrence. I recommend history, physical examination blood protein 6 months in today for imaging studies until one year from now and he agreed. I reinforced the importance of surveillance program by the radiation oncologist at [facility].

## 2013-09-13 NOTE — Assessment & Plan Note (Signed)
He is not taking his thyroid supplements consistently. I reinforced the importance of consistent thyroid supplement.

## 2013-10-14 ENCOUNTER — Ambulatory Visit: Payer: Self-pay | Admitting: Radiation Oncology

## 2013-10-28 ENCOUNTER — Ambulatory Visit
Admission: RE | Admit: 2013-10-28 | Discharge: 2013-10-28 | Disposition: A | Discharge status: Home / Self Care | Payer: 59 | Source: Ambulatory Visit | Attending: Radiation Oncology | Admitting: Radiation Oncology

## 2013-10-28 ENCOUNTER — Encounter: Payer: Self-pay | Admitting: Radiation Oncology

## 2013-10-28 VITALS — BP 109/76 | HR 78 | Temp 97.7°F | Resp 16 | Ht 74.0 in | Wt 220.3 lb

## 2013-10-28 DIAGNOSIS — C023 Malignant neoplasm of anterior two-thirds of tongue, part unspecified: Secondary | ICD-10-CM

## 2013-10-28 NOTE — Progress Notes (Signed)
Jeannie Fend here for follow up after treatment to his tongue.  He denies pain but continues to have sharp pains that shoot down the scars on both sides of his neck.  He says they happen 2-3 times a day especially when lifting or raising his arms. He had a CT of his neck on 09/06/13. He reports a dry mouth and chews gum which helps.  He avoids dry foods like bread.  His weight is stable.  He denies any mouth sores.  He denies fatigue and is working 2 jobs and working out 4 times a week.

## 2013-10-28 NOTE — Progress Notes (Signed)
  Radiation Oncology         [id number]) [phone] ________________________________  Name: [name] MRN: [id number]  Date: 10/28/2013  DOB: March 08, 1980  Follow-Up Visit Note  CC: [name], MD  Elsie Lincoln, MD  Diagnosis:   Recurrent oral tongue  Interval Since Last Radiation:  15  months  Narrative:  The patient returns today for routine follow-up.  He is doing well this time. He continues work to be quite busy as well as working out. He has chronic xerostomia but is managed this issue well. He denies any pain within the oral cavity or throat. He denies any odynophagia or dysphagia.   The patient underwent a recent CT scan of the neck showing no evidence of recurrence                         ALLERGIES:  has No Known Allergies.  Meds: Current Outpatient Prescriptions  Medication Sig Dispense Refill  . levothyroxine (SYNTHROID, LEVOTHROID) 25 MCG tablet Take 25 mcg by mouth daily before breakfast.      . simvastatin (ZOCOR) 20 MG tablet Take 20 mg by mouth every evening.        No current facility-administered medications for this encounter.    Physical Findings: The patient is in no acute distress. Patient is alert and oriented.  height is 6\' 2"  (1.88 m) and weight is 220 lb 4.8 oz (99.927 kg). His oral temperature is 97.7 F (36.5 C). His blood pressure is 109/76 and his pulse is 78. His respiration is 16 and oxygen saturation is 100%. .  No palpable supraclavicular or axillary adenopathy. The lungs are clear to auscultation. The heart has regular rhythm and rate. Examination of oral cavity reveals surgical changes along the oral tongue as noted before without any palpable or visible signs recurrence. The patient continues to have some trismus. Patient proceeded to undergo an indirect mirror examination. It good view of the base of tongue and epiglottis was noted. No lesions. The entire extent of the vocal cords could not be seen but moved well on exam  Lab Findings: Lab  Results  Component Value Date   WBC 5.5 09/06/2013   HGB 14.3 09/06/2013   HCT 42.7 09/06/2013   MCV 86.9 09/06/2013   PLT 232 09/06/2013      Radiographic Findings: No results found.  Impression: No evidence of recurrence on clinical exam and recent CT scan  Plan:  Routine followup in January of 2016. In the interim the patient will be seen by ENT.  ____________________________________ Blair Promise, MD

## 2013-11-04 ENCOUNTER — Ambulatory Visit: Payer: 59 | Admitting: Radiation Oncology

## 2014-01-22 ENCOUNTER — Emergency Department (HOSPITAL_COMMUNITY): Payer: 59

## 2014-01-22 ENCOUNTER — Encounter (HOSPITAL_COMMUNITY): Payer: Self-pay | Admitting: Emergency Medicine

## 2014-01-22 ENCOUNTER — Inpatient Hospital Stay (HOSPITAL_COMMUNITY)
Admission: EM | Admit: 2014-01-22 | Discharge: 2014-01-28 | DRG: 087 | Disposition: A | Discharge status: Home / Self Care | Payer: 59 | Attending: Neurosurgery | Admitting: Neurosurgery

## 2014-01-22 DIAGNOSIS — W1830XA Fall on same level, unspecified, initial encounter: Secondary | ICD-10-CM | POA: Diagnosis present

## 2014-01-22 DIAGNOSIS — Z9221 Personal history of antineoplastic chemotherapy: Secondary | ICD-10-CM

## 2014-01-22 DIAGNOSIS — K219 Gastro-esophageal reflux disease without esophagitis: Secondary | ICD-10-CM | POA: Diagnosis present

## 2014-01-22 DIAGNOSIS — Z79899 Other long term (current) drug therapy: Secondary | ICD-10-CM | POA: Diagnosis not present

## 2014-01-22 DIAGNOSIS — S0219XA Other fracture of base of skull, initial encounter for closed fracture: Secondary | ICD-10-CM | POA: Diagnosis present

## 2014-01-22 DIAGNOSIS — Z85810 Personal history of malignant neoplasm of tongue: Secondary | ICD-10-CM | POA: Diagnosis not present

## 2014-01-22 DIAGNOSIS — E039 Hypothyroidism, unspecified: Secondary | ICD-10-CM | POA: Diagnosis present

## 2014-01-22 DIAGNOSIS — S066X1A Traumatic subarachnoid hemorrhage with loss of consciousness of 30 minutes or less, initial encounter: Principal | ICD-10-CM | POA: Diagnosis present

## 2014-01-22 DIAGNOSIS — S06311A Contusion and laceration of right cerebrum with loss of consciousness of 30 minutes or less, initial encounter: Secondary | ICD-10-CM

## 2014-01-22 DIAGNOSIS — S06321A Contusion and laceration of left cerebrum with loss of consciousness of 30 minutes or less, initial encounter: Secondary | ICD-10-CM

## 2014-01-22 DIAGNOSIS — Z923 Personal history of irradiation: Secondary | ICD-10-CM

## 2014-01-22 DIAGNOSIS — Z87891 Personal history of nicotine dependence: Secondary | ICD-10-CM

## 2014-01-22 DIAGNOSIS — S065X9A Traumatic subdural hemorrhage with loss of consciousness of unspecified duration, initial encounter: Secondary | ICD-10-CM

## 2014-01-22 DIAGNOSIS — S0990XA Unspecified injury of head, initial encounter: Secondary | ICD-10-CM | POA: Diagnosis present

## 2014-01-22 DIAGNOSIS — Y990 Civilian activity done for income or pay: Secondary | ICD-10-CM | POA: Diagnosis not present

## 2014-01-22 DIAGNOSIS — W19XXXA Unspecified fall, initial encounter: Secondary | ICD-10-CM

## 2014-01-22 DIAGNOSIS — S069X9A Unspecified intracranial injury with loss of consciousness of unspecified duration, initial encounter: Secondary | ICD-10-CM

## 2014-01-22 DIAGNOSIS — R51 Headache: Secondary | ICD-10-CM | POA: Diagnosis present

## 2014-01-22 DIAGNOSIS — E780 Pure hypercholesterolemia: Secondary | ICD-10-CM | POA: Diagnosis present

## 2014-01-22 DIAGNOSIS — S0291XA Unspecified fracture of skull, initial encounter for closed fracture: Secondary | ICD-10-CM

## 2014-01-22 DIAGNOSIS — I609 Nontraumatic subarachnoid hemorrhage, unspecified: Secondary | ICD-10-CM

## 2014-01-22 DIAGNOSIS — S065XAA Traumatic subdural hemorrhage with loss of consciousness status unknown, initial encounter: Secondary | ICD-10-CM

## 2014-01-22 LAB — COMPREHENSIVE METABOLIC PANEL
ALT: 21 U/L (ref 0–53)
AST: 33 U/L (ref 0–37)
Albumin: 3.9 g/dL (ref 3.5–5.2)
Alkaline Phosphatase: 72 U/L (ref 39–117)
Anion gap: 8 (ref 5–15)
BILIRUBIN TOTAL: 0.9 mg/dL (ref 0.3–1.2)
BUN: 14 mg/dL (ref 6–23)
CALCIUM: 9 mg/dL (ref 8.4–10.5)
CHLORIDE: 104 meq/L (ref 96–112)
CO2: 24 mmol/L (ref 19–32)
Creatinine, Ser: 1.01 mg/dL (ref 0.50–1.35)
GLUCOSE: 136 mg/dL — AB (ref 70–99)
Potassium: 3.8 mmol/L (ref 3.5–5.1)
SODIUM: 136 mmol/L (ref 135–145)
Total Protein: 6.7 g/dL (ref 6.0–8.3)

## 2014-01-22 LAB — CBC WITH DIFFERENTIAL/PLATELET
BASOS ABS: 0 10*3/uL (ref 0.0–0.1)
BASOS PCT: 0 % (ref 0–1)
EOS ABS: 0.2 10*3/uL (ref 0.0–0.7)
Eosinophils Relative: 1 % (ref 0–5)
HCT: 39.3 % (ref 39.0–52.0)
Hemoglobin: 14.1 g/dL (ref 13.0–17.0)
LYMPHS ABS: 0.6 10*3/uL — AB (ref 0.7–4.0)
Lymphocytes Relative: 4 % — ABNORMAL LOW (ref 12–46)
MCH: 30.7 pg (ref 26.0–34.0)
MCHC: 35.9 g/dL (ref 30.0–36.0)
MCV: 85.6 fL (ref 78.0–100.0)
Monocytes Absolute: 1 10*3/uL (ref 0.1–1.0)
Monocytes Relative: 8 % (ref 3–12)
NEUTROS PCT: 87 % — AB (ref 43–77)
Neutro Abs: 11.6 10*3/uL — ABNORMAL HIGH (ref 1.7–7.7)
PLATELETS: 210 10*3/uL (ref 150–400)
RBC: 4.59 MIL/uL (ref 4.22–5.81)
RDW: 12.4 % (ref 11.5–15.5)
WBC: 13.4 10*3/uL — ABNORMAL HIGH (ref 4.0–10.5)

## 2014-01-22 LAB — URINALYSIS, ROUTINE W REFLEX MICROSCOPIC
Bilirubin Urine: NEGATIVE
Glucose, UA: NEGATIVE mg/dL
Hgb urine dipstick: NEGATIVE
Ketones, ur: NEGATIVE mg/dL
LEUKOCYTES UA: NEGATIVE
NITRITE: NEGATIVE
PROTEIN: NEGATIVE mg/dL
Specific Gravity, Urine: 1.022 (ref 1.005–1.030)
Urobilinogen, UA: 0.2 mg/dL (ref 0.0–1.0)
pH: 6.5 (ref 5.0–8.0)

## 2014-01-22 LAB — RAPID URINE DRUG SCREEN, HOSP PERFORMED
Amphetamines: NOT DETECTED
Barbiturates: NOT DETECTED
Benzodiazepines: NOT DETECTED
Cocaine: NOT DETECTED
Opiates: NOT DETECTED
Tetrahydrocannabinol: NOT DETECTED

## 2014-01-22 LAB — MRSA PCR SCREENING: MRSA by PCR: POSITIVE — AB

## 2014-01-22 LAB — TROPONIN I

## 2014-01-22 MED ORDER — HYDROCODONE-ACETAMINOPHEN 5-325 MG PO TABS
1.0000 | ORAL_TABLET | ORAL | Status: DC | PRN
  Administered 2014-01-22 – 2014-01-28 (×10): 2 via ORAL
  Filled 2014-01-22 (×10): qty 2

## 2014-01-22 MED ORDER — SODIUM CHLORIDE 0.9 % IV SOLN
INTRAVENOUS | Status: DC
  Administered 2014-01-22 – 2014-01-27 (×10): via INTRAVENOUS
  Filled 2014-01-22 (×20): qty 1000

## 2014-01-22 MED ORDER — CHLORHEXIDINE GLUCONATE CLOTH 2 % EX PADS
6.0000 | MEDICATED_PAD | Freq: Every day | CUTANEOUS | Status: AC
  Administered 2014-01-23 – 2014-01-27 (×5): 6 via TOPICAL

## 2014-01-22 MED ORDER — HYDROXYZINE HCL 25 MG PO TABS
50.0000 mg | ORAL_TABLET | ORAL | Status: DC | PRN
  Filled 2014-01-22: qty 1

## 2014-01-22 MED ORDER — ACETAMINOPHEN 325 MG PO TABS
650.0000 mg | ORAL_TABLET | ORAL | Status: DC | PRN
  Administered 2014-01-22 – 2014-01-28 (×7): 650 mg via ORAL
  Filled 2014-01-22 (×7): qty 2

## 2014-01-22 MED ORDER — MUPIROCIN 2 % EX OINT
1.0000 "application " | TOPICAL_OINTMENT | Freq: Two times a day (BID) | CUTANEOUS | Status: AC
  Administered 2014-01-22 – 2014-01-27 (×10): 1 via NASAL
  Filled 2014-01-22: qty 22

## 2014-01-22 MED ORDER — SIMVASTATIN 20 MG PO TABS
20.0000 mg | ORAL_TABLET | Freq: Every evening | ORAL | Status: DC
  Administered 2014-01-22 – 2014-01-27 (×6): 20 mg via ORAL
  Filled 2014-01-22 (×7): qty 1

## 2014-01-22 MED ORDER — SODIUM CHLORIDE 0.9 % IV BOLUS (SEPSIS)
1000.0000 mL | Freq: Once | INTRAVENOUS | Status: AC
  Administered 2014-01-22: 1000 mL via INTRAVENOUS

## 2014-01-22 MED ORDER — LEVOTHYROXINE SODIUM 25 MCG PO TABS
25.0000 ug | ORAL_TABLET | Freq: Every day | ORAL | Status: DC
  Administered 2014-01-23 – 2014-01-28 (×6): 25 ug via ORAL
  Filled 2014-01-22 (×8): qty 1

## 2014-01-22 MED ORDER — ONDANSETRON HCL 4 MG/2ML IJ SOLN
4.0000 mg | Freq: Four times a day (QID) | INTRAMUSCULAR | Status: DC | PRN
  Administered 2014-01-23 (×2): 8 mg via INTRAVENOUS
  Filled 2014-01-22 (×2): qty 4

## 2014-01-22 NOTE — ED Notes (Signed)
Received 4mg  Zofran en route for Nausea.

## 2014-01-22 NOTE — H&P (Signed)
Subjective: Patient is a 33 y.o. male who is admitted for treatment of a head injury suffered earlier today at work.  No family or coworkers are at bedside, and the history was provided by Dr. Rolland Porter (EDP), who explain that she obtain a history from the EMS report. Apparently the patient fell at work, it's unclear whether this was witnessed or unwitnessed. There is no mention of seizure activity, but there is described a 20 minute loss of consciousness. The exact circumstances of his injury are uncertain.  Dr. Tomi Bamberger evaluated the patient with CT of the head and CT of the cervical spine.  The CT of the head shows a fracture of the vertex of the calvarium, bifrontal hemorrhagic contusions, and a very thin subdural hematoma around the frontal pole. CT of the cervical spine is reported by the radiologist to have no acute findings, and mild spondylosis.   The patient denies any neck discomfort.  He does describe some headache, but denies any double vision, blurred vision, nausea, vomiting, weakness, or seizures. He does have a notable history of tongue cancer since July 2013, has undergone a number of resections by Dr. Elie Goody, as well as chemotherapy under the direction of Dr. Heath Lark, and radiation therapy under the direction of Dr. Gery Pray.  The patient himself is amnestic for the circumstances that occurred earlier today.   Patient Active Problem List   Diagnosis Date Noted  . Neck pain 09/13/2013  . Kidney stones 03/15/2013  . Hypothyroid 12/15/2012  . Xerostomia 11/30/2012  . Tongue cancer   . History of radiation therapy    Past Medical History  Diagnosis Date  . High triglycerides   . Gout   . Staph skin infection     Elbow- resolved.    . Head and neck cancer     tongue cancer  . Stones in the urinary tract   . GERD (gastroesophageal reflux disease)     occ  . History of radiation therapy 06/02/12- 07/14/12    oral tongue and bilateral neck, 60 gray in 30 fx  . Tongue  cancer   . Xerostomia 11/30/2012  . PONV (postoperative nausea and vomiting)     tongue swelled-emergency trach  . Hypothyroidism     radiation- not working  . History of kidney stones   . Dry mouth     radiation  . Ringing in ears     Past Surgical History  Procedure Laterality Date  . Mouth surgery      biopsy- (08/06/2011 Dr. Hoyt Koch), also had tissue graft (oral) 2010  . Glossectomy    . Glossectomy  08/22/2011    Procedure: GLOSSECTOMY;  Surgeon: [name], MD;  Location: [facility];  Service: ENT;  Laterality: Right;  WITH FROZEN SECTION  . Glossectomy  09/06/2011    Procedure: GLOSSECTOMY;  Surgeon: [name], MD;  Location: [facility];  Service: ENT;  Laterality: Right;  Right Tongue Re-Excision  . Radical neck dissection  09/06/2011    Procedure: RADICAL NECK DISSECTION;  Surgeon: [name], MD;  Location: [place];  Service: ENT;  Laterality: Right;  Right Modified Neck Dissection   . Hemiglossectomy N/A 03/23/2012    Procedure: BIOPSY OF TONGUE WITH FROZEN SECTION AND RIGHT HEMIGLOSSECTOMY;  Surgeon: [name], MD;  Location: [facility];  Service: ENT;  Laterality: N/A;  . Tracheostomy tube placement N/A 03/23/2012    Procedure: TRACHEOSTOMY;  Surgeon: [name], MD;  Location: [facility];  Service: ENT;  Laterality: N/A;  .  Tongue biopsy    . Tracheostomy closure      14  . Gastrostomy w/ feeding tube  2/14  . Radical neck dissection Left 04/23/2012    Procedure: LEFT MODIFIED  NECK DISSECTION;  Surgeon: [name], MD;  Location: [facility];  Service: ENT;  Laterality: Left;  Left Supra Omo Hyoid Neck Dissection   . Cystoscopy with retrograde pyelogram, ureteroscopy and stent placement Left 04/16/2013    Procedure: CYSTOSCOPY WITH RETROGRADE PYELOGRAM, URETEROSCOPY AND STENT PLACEMENT;  Surgeon: Molli Hazard, MD;  Location: [facility];  Service: Urology;  Laterality: Left;  . Holmium laser application Left 4/40/1027    Procedure: HOLMIUM LASER APPLICATION;  Surgeon: Molli Hazard, MD;   Location: [facility];  Service: Urology;  Laterality: Left;     (Not in a hospital admission) No Known Allergies  History  Substance Use Topics  . Smoking status: Former Smoker -- 15 years    Types: Cigars    Quit date: 08/14/2011  . Smokeless tobacco: Never Used     Comment: some day smoker before quitting  . Alcohol Use: No     Comment: quit drinking 7/13 from 12 pack of beeer on weekends    Family History  Problem Relation Age of Onset  . Rheum arthritis Mother   . Asthma Father   . Cancer Maternal Grandmother   . Diabetes Sister   . Diabetes Brother      Review of Systems A comprehensive review of systems was negative.  Objective: Vital signs in last 24 hours: Temp:  [100.3 F (37.9 C)] 100.3 F (37.9 C) (12/26 0513) Pulse Rate:  [84-104] 85 (12/26 0815) Resp:  [10-18] 18 (12/26 0815) BP: (115-123)/(75-80) 119/75 mmHg (12/26 0815) SpO2:  [95 %-100 %] 98 % (12/26 0815) Weight:  [95.255 kg (210 lb)] 95.255 kg (210 lb) (12/26 0513)  EXAM: Patient is a well-developed well-nourished white male in no acute distress. Lungs are clear to auscultation , the patient has symmetrical respiratory excursion. Heart has a regular rate and rhythm normal S1 and S2 no murmur.   Abdomen is soft nontender nondistended bowel sounds are present. Extremity examination shows no clubbing cyanosis or edema. Patient is awake and alert, oriented to his name, [facility], and December 2015.  Speech is fluent, he has good comprehension. Follows commands briskly. Cranial nerves show pupils are equal, round, reactive to light. Pupils are about 2.5 mm in diameter. EOMI. Facial sensation intact. Facial movements symmetrical. Hearing present bilateral. Palatal movement symmetrical. Shoulder shrug symmetrical. Tongue essentially midline. Motor examination shows 5 over 5 strength in the upper and lower extremities including the deltoid, biceps, triceps, intrinsics, grip, iliopsoas, quadriceps,  dorsiflexors, and plantar flexors bilaterally. Sensation is intact to pinprick throughout the digits of the upper extremities. Reflexes are symmetrical and without evidence of pathologic reflexes. Gait and stance were not tested due to the nature of his condition.   Data Review:CBC    Component Value Date/Time   WBC 13.4* 01/22/2014 0530   WBC 5.5 09/06/2013 0941   RBC 4.59 01/22/2014 0530   RBC 4.91 09/06/2013 0941   HGB 14.1 01/22/2014 0530   HGB 14.3 09/06/2013 0941   HCT 39.3 01/22/2014 0530   HCT 42.7 09/06/2013 0941   PLT 210 01/22/2014 0530   PLT 232 09/06/2013 0941   MCV 85.6 01/22/2014 0530   MCV 86.9 09/06/2013 0941   MCH 30.7 01/22/2014 0530   MCH 29.1 09/06/2013 0941   MCHC 35.9 01/22/2014 0530  MCHC 33.5 09/06/2013 0941   RDW 12.4 01/22/2014 0530   RDW 13.1 09/06/2013 0941   LYMPHSABS 0.6* 01/22/2014 0530   LYMPHSABS 0.6* 09/06/2013 0941   MONOABS 1.0 01/22/2014 0530   MONOABS 0.5 09/06/2013 0941   EOSABS 0.2 01/22/2014 0530   EOSABS 0.3 09/06/2013 0941   BASOSABS 0.0 01/22/2014 0530   BASOSABS 0.1 09/06/2013 0941                          BMET    Component Value Date/Time   NA 136 01/22/2014 0530   NA 140 03/19/2013 0840   K 3.8 01/22/2014 0530   K 4.5 03/19/2013 0840   CL 104 01/22/2014 0530   CL 100 07/10/2012 1341   CO2 24 01/22/2014 0530   CO2 26 03/19/2013 0840   GLUCOSE 136* 01/22/2014 0530   GLUCOSE 96 03/19/2013 0840   GLUCOSE 118* 07/10/2012 1341   BUN 14 01/22/2014 0530   BUN 13.8 03/19/2013 0840   CREATININE 1.01 01/22/2014 0530   CREATININE 1.0 03/19/2013 0840   CALCIUM 9.0 01/22/2014 0530   CALCIUM 10.1 03/19/2013 0840   GFRNONAA >90 01/22/2014 0530   GFRAA >90 01/22/2014 0530     Assessment/Plan: Patient who suffered a head injury including skull fracture, cerebral contusions, traumatic subarachnoid hemorrhage, and then subdural hematoma from a fall at work. The exact circumstances of the events at work are unknown. Currently he  is awake, alert, oriented, with good strength throughout. He is being admitted to the neurosurgical intensive care unit for observation including neuro checks. A repeat CT of the head will be done tomorrow.   Hosie Spangle, MD 01/22/2014 8:27 AM

## 2014-01-22 NOTE — ED Notes (Signed)
Patient here after losing consciousness post work Midwife. Patient works as Company secretary. EMS reports tonight patient was walking, lost consciousness, and fell forward striking head on ground. Reportedly was unconscious for 20 mins. EMS reported A+O x1

## 2014-01-22 NOTE — ED Provider Notes (Signed)
CSN: [id number]     Arrival date & time 01/22/14  0505 History   First MD Initiated Contact with Patient 01/22/14 0534     Chief Complaint  Patient presents with  . Loss of Consciousness     (Consider location/radiation/quality/duration/timing/severity/associated sxs/prior Treatment) HPI  Patient has a history of tongue cancer status post resection and radiation and chemotherapy. He states this was 2 years ago. Patient states he went to work at midnight he felt fine. 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 He denies feeling bad but evidently coworkers saw that he fell while standing and hit his head. He did not have a fall from a height. He was noted to be unconscious for at least 20 minutes. Patient states he woke up in the ambulance. He complains of a gradual frontal headache that started about 2 hours ago. The headache is not throbbing but he does have some photosensitivity without sound sensitivity. He denies nausea, vomiting, blurred vision, numbness or tingling of his extremities. He states he's had headaches like this before. He denies being overheated at work. He denies feeling like he was going to pass out.  Oncologist Dr Sondra Come at [facility]  Past Medical History  Diagnosis Date  . High triglycerides   . Gout   . Staph skin infection     Elbow- resolved.    . Head and neck cancer     tongue cancer  . Stones in the urinary tract   . GERD (gastroesophageal reflux disease)     occ  . History of radiation therapy 06/02/12- 07/14/12    oral tongue and bilateral neck, 60 gray in 30 fx  . Tongue cancer   . Xerostomia 11/30/2012  . PONV (postoperative nausea and vomiting)     tongue swelled-emergency trach  . Hypothyroidism     radiation- not working  . History of kidney stones   . Dry mouth     radiation  . Ringing in ears    Past Surgical History  Procedure Laterality Date  .  Mouth surgery      biopsy- (08/06/2011 Dr. Hoyt Koch), also had tissue graft (oral) 2010  . Glossectomy    . Glossectomy  08/22/2011    Procedure: GLOSSECTOMY;  Surgeon: [name], MD;  Location: [facility];  Service: ENT;  Laterality: Right;  WITH FROZEN SECTION  . Glossectomy  09/06/2011    Procedure: GLOSSECTOMY;  Surgeon: [name], MD;  Location: [facility] OR;  Service: ENT;  Laterality: Right;  Right Tongue Re-Excision  . Radical neck dissection  09/06/2011    Procedure: RADICAL NECK DISSECTION;  Surgeon: [name], MD;  Location: [place];  Service: ENT;  Laterality: Right;  Right Modified Neck Dissection   . Hemiglossectomy N/A 03/23/2012    Procedure: BIOPSY OF TONGUE WITH FROZEN SECTION AND RIGHT HEMIGLOSSECTOMY;  Surgeon: [name], MD;  Location: [place];  Service: ENT;  Laterality: N/A;  . Tracheostomy tube placement N/A 03/23/2012    Procedure: TRACHEOSTOMY;  Surgeon: [name], MD;  Location: [place];  Service: ENT;  Laterality: N/A;  . Tongue biopsy    . Tracheostomy closure      14  . Gastrostomy w/ feeding tube  2/14  . Radical neck dissection Left 04/23/2012    Procedure: LEFT MODIFIED  NECK DISSECTION;  Surgeon: [name], MD;  Location: [place];  Service: ENT;  Laterality: Left;  Left Supra Omo Hyoid Neck Dissection   . Cystoscopy with retrograde pyelogram, ureteroscopy and stent placement Left 04/16/2013    Procedure: CYSTOSCOPY WITH RETROGRADE PYELOGRAM, URETEROSCOPY AND STENT PLACEMENT;  Surgeon: Molli Hazard, MD;  Location: [facility];  Service: Urology;  Laterality: Left;  . Holmium laser application Left 2/70/3500    Procedure: HOLMIUM LASER APPLICATION;  Surgeon: Molli Hazard, MD;  Location: [facility];  Service: Urology;  Laterality: Left;   Family History  Problem Relation Age of Onset  . Rheum arthritis Mother   . Asthma Father   . Cancer Maternal Grandmother   . Diabetes Sister   . Diabetes Brother    History  Substance Use Topics  . Smoking status: Former Smoker  -- 15 years    Types: Cigars    Quit date: 08/14/2011  . Smokeless tobacco: Never Used     Comment: some day smoker before quitting  . Alcohol Use: No     Comment: quit drinking 7/13 from 12 pack of beeer on weekends  employed  Review of Systems  All other systems reviewed and are negative.     Allergies  Review of patient's allergies indicates no known allergies.  Home Medications   Prior to Admission medications   Medication Sig Start Date End Date Taking? Authorizing Provider  levothyroxine (SYNTHROID, LEVOTHROID) 25 MCG tablet Take 25 mcg by mouth daily before breakfast.   Yes Historical Provider, MD  simvastatin (ZOCOR) 20 MG tablet Take 20 mg by mouth every evening.    Yes Historical Provider, MD   BP 117/75 mmHg  Pulse 87  Temp(Src) 100.3 F (37.9 C) (Oral)  Resp 17  Ht 6\' 2"  (1.88 m)  Wt 210 lb (95.255 kg)  BMI 26.95 kg/m2  SpO2 98%  Vital signs normal except for low-grade fever  Physical Exam  Constitutional: He is oriented to person, place, and time. He appears well-developed and well-nourished.  Non-toxic appearance. He does not appear ill. No distress.  HENT:  Head: Normocephalic and atraumatic.  Right Ear: External ear normal.  Left Ear: External ear normal.  Nose: Nose normal. No mucosal edema or rhinorrhea.  Mouth/Throat: Oropharynx is clear and moist and mucous membranes are normal. No dental abscesses or uvula swelling.  Patient has some mild speech impediment, on exam of his tongue he has had surgery on his tongue.  Eyes: Conjunctivae and EOM are normal. Pupils are equal, round, and reactive to light.  Neck: Normal range of motion and full passive range of motion without pain. Neck supple.  Cardiovascular: Normal rate, regular rhythm and normal heart sounds.  Exam reveals no gallop and no friction rub.   No murmur heard. Pulmonary/Chest: Effort normal and breath sounds normal. No respiratory distress. He has no wheezes. He has no rhonchi. He has no  rales. He exhibits no tenderness and no crepitus.  Abdominal: Soft. Normal appearance and bowel sounds are normal. He exhibits no distension. There is no tenderness. There is no rebound and no guarding.  Musculoskeletal: Normal range of motion. He exhibits no edema or tenderness.  Moves all extremities well.   Neurological: He is alert and oriented to person, place, and time. He has normal strength. No cranial nerve deficit.  Skin: Skin is warm, dry and intact. No rash noted. No erythema. No pallor.  Psychiatric: He has a normal mood and affect. His speech is normal and behavior is normal. His mood appears not anxious.  Nursing note and vitals reviewed.   ED Course  Procedures (including critical care time)  Medications  sodium chloride 0.9 % bolus 1,000 mL (1,000 mLs Intravenous New Bag/Given 01/22/14 0655)      06:40 Radiology called results of CT scan  07:26 Dr Sherwood Gambler, has looked at his scan, feels he needs admission possibly by medicine.  Pt given results of his CT scan and need for admission.   08:20 Dr Sherwood Gambler has seen patient, states he is going to admit to neuro ICU and monitor.    Labs Review Results for orders placed or performed during the hospital encounter of 01/22/14  Comprehensive metabolic panel  Result Value Ref Range   Sodium 136 135 - 145 mmol/L   Potassium 3.8 3.5 - 5.1 mmol/L   Chloride 104 96 - 112 mEq/L   CO2 24 19 - 32 mmol/L   Glucose, Bld 136 (H) 70 - 99 mg/dL   BUN 14 6 - 23 mg/dL   Creatinine, Ser 1.01 0.50 - 1.35 mg/dL   Calcium 9.0 8.4 - 10.5 mg/dL   Total Protein 6.7 6.0 - 8.3 g/dL   Albumin 3.9 3.5 - 5.2 g/dL   AST 33 0 - 37 U/L   ALT 21 0 - 53 U/L   Alkaline Phosphatase 72 39 - 117 U/L   Total Bilirubin 0.9 0.3 - 1.2 mg/dL   GFR calc non Af Amer >90 >90 mL/min   GFR calc Af Amer >90 >90 mL/min   Anion gap 8 5 - 15  CBC with Differential  Result Value Ref Range   WBC 13.4 (H) 4.0 - 10.5 K/uL   RBC 4.59 4.22 - 5.81 MIL/uL    Hemoglobin 14.1 13.0 - 17.0 g/dL   HCT 39.3 39.0 - 52.0 %   MCV 85.6 78.0 - 100.0 fL   MCH 30.7 26.0 - 34.0 pg   MCHC 35.9 30.0 - 36.0 g/dL   RDW 12.4 11.5 - 15.5 %   Platelets 210 150 - 400 K/uL   Neutrophils Relative % 87 (H) 43 - 77 %   Neutro Abs 11.6 (H) 1.7 - 7.7 K/uL   Lymphocytes Relative 4 (L) 12 - 46 %   Lymphs Abs 0.6 (L) 0.7 - 4.0 K/uL   Monocytes Relative 8 3 - 12 %   Monocytes Absolute 1.0 0.1 - 1.0 K/uL   Eosinophils Relative 1 0 - 5 %   Eosinophils Absolute 0.2 0.0 - 0.7 K/uL   Basophils Relative 0 0 - 1 %   Basophils Absolute 0.0 0.0 - 0.1 K/uL  Troponin I  Result Value Ref Range   Troponin I <0.03 <0.031 ng/mL   Laboratory interpretation all normal except leukocytosis     Imaging Review Ct Head Wo Contrast  01/22/2014   CLINICAL DATA:  Acute onset of syncope. Loss of consciousness for 30 minutes. Initial encounter.  EXAM: CT HEAD WITHOUT CONTRAST  TECHNIQUE: Contiguous axial images were obtained from the base of the skull through the vertex without intravenous contrast.  COMPARISON:  CT of the neck performed 09/06/2013  FINDINGS: Multiple small contusions and intraparenchymal hemorrhage are noted along the inferior frontal lobes bilaterally, with associated subarachnoid blood. Contusions measure up to 8 mm in size. These are suspicious for a contrecoup injury, and likely correspond to the calvarial fracture described below.  Additional subarachnoid blood is seen tracking superiorly along the right frontal lobe. There is also a small amount of subdural blood tracking along the right side of the anterior falx cerebri, measuring 4 mm in thickness. A small amount  of subarachnoid blood is seen extending along the left Sylvian fissure, and about the medial and lateral aspects of the left frontoparietal region.  Mild cerebellar atrophy is noted. No significant midline shift is seen. There is mild vasogenic edema within the inferior frontal lobes bilaterally.  There appears to  be an incomplete fracture line extending across the upper midline frontal calvarium, extending from the sagittal suture. The visualized portions of the orbits are within normal limits. Fluid is noted filling the right side of the sphenoid sinus and the maxillary sinuses, only slightly increased in attenuation, without evidence of associated fracture. The remaining paranasal sinuses and mastoid air cells are well-aerated. Mild soft tissue swelling is suggested along the vertex.  IMPRESSION: 1. Unusual pattern of multiple contusions and intraparenchymal hemorrhage along the inferior frontal lobes bilaterally, with associated subarachnoid blood. This is suspicious for a contrecoup injury, and likely corresponds to the calvarial fracture described below. Mild associated vasogenic edema seen. 2. Additional subarachnoid blood seen tracking superiorly along the right frontal lobe, along the left Sylvian fissure, and about the medial and lateral aspects of the left frontoparietal region. 3. Small amount of subdural blood tracking along the right side of the anterior falx cerebri, measuring 4 mm in thickness. 4. Incomplete fracture line extending across the upper midline frontal calvarium, extending from the sagittal suture. 5. Mild soft tissue swelling suggested along the vertex. 6. Mild cerebellar atrophy noted. 7. Fluid noted filling the right side of the sphenoid sinus and the maxillary sinuses, of slightly increased attenuation, without evidence of blood or associated fracture.  Critical Value/emergent results were called by telephone at the time of interpretation on 01/22/2014 at 6:40 am to Dr. Rolland Porter, who verbally acknowledged these results.   Electronically Signed   By: Garald Balding M.D.   On: 01/22/2014 07:09   Ct Cervical Spine Wo Contrast  01/22/2014   CLINICAL DATA:  Fall.  Patient states no trauma.  EXAM: CT CERVICAL SPINE WITHOUT CONTRAST  TECHNIQUE: Multidetector CT imaging of the cervical spine was  performed without intravenous contrast. Multiplanar CT image reconstructions were also generated.  COMPARISON:  CT neck 09/06/2013  FINDINGS: Vertebral body alignment, heights and disc space heights are normal. There is minimal spondylosis present. Prevertebral soft tissues as well as a atlantoaxial articulation are normal. There is no fracture or subluxation. There are a few surgical clips over the soft tissues to the right at the C1-2 level. Remainder of the exam is unremarkable.  IMPRESSION: No acute findings.  Mild spondylosis of the cervical spine.   Electronically Signed   By: Marin Olp M.D.   On: 01/22/2014 08:00     EKG Interpretation   Date/Time:  Saturday January 22 2014 05:16:32 EST Ventricular Rate:  92 PR Interval:  170 QRS Duration: 94 QT Interval:  347 QTC Calculation: 429 R Axis:   74 Text Interpretation:  Sinus rhythm ST elev, probable normal early repol  pattern Otherwise within normal limits No old tracing to compare Confirmed  by Demetrias Goodbar  MD-I, Jamyiah Labella ([id number]) on 01/22/2014 6:46:17 AM      MDM   Final diagnoses:  Fall  Skull fracture, closed, initial encounter  Frontal lobe contusion, right, with loss of consciousness of 30 minutes or less, initial encounter  Frontal lobe contusion, left, with loss of consciousness of 30 minutes or less, initial encounter  SAH (subarachnoid hemorrhage)  SDH (subdural hematoma)    Plan admission   Rolland Porter, MD, Brooke Bonito  Harmon Dun, MD 01/22/14 [phone]

## 2014-01-22 NOTE — ED Notes (Signed)
Per Dr. Tomi Bamberger d/ced cervical collar

## 2014-01-22 NOTE — ED Notes (Signed)
Helped pt call wife Mendel Ryder at [phone]

## 2014-01-23 ENCOUNTER — Inpatient Hospital Stay (HOSPITAL_COMMUNITY): Payer: 59

## 2014-01-23 NOTE — Progress Notes (Signed)
0630 prepared patient to ambulate, sitting up on side of bed, checked BP. Patient complained of nausea. Had patient lay back down. Administered PRN zofran.

## 2014-01-23 NOTE — Progress Notes (Signed)
Pt seen and examined. No issues overnight. Pt reports nausea as his primary complaint. Has minimal HA. No visual changes, N/T/W.  EXAM: Temp:  [97.4 F (36.3 C)-98.7 F (37.1 C)] 97.5 F (36.4 C) (12/27 0800) Pulse Rate:  [60-91] 60 (12/27 0800) Resp:  [8-20] 11 (12/27 0800) BP: (94-122)/(44-71) 96/44 mmHg (12/27 0800) SpO2:  [95 %-100 %] 97 % (12/27 0800) Weight:  [96.7 kg (213 lb 3 oz)] 96.7 kg (213 lb 3 oz) (12/26 1038) Intake/Output      12/26 0701 - 12/27 0700 12/27 0701 - 12/28 0700   P.O. 240    I.V. (mL/kg) 1975 (20.4) 100 (1)   Total Intake(mL/kg) 2215 (22.9) 100 (1)   Urine (mL/kg/hr) 1800 (0.8)    Total Output 1800     Net +415 +100         Awake, alert, oriented Speech fluent Moving all extremities well  LABS: Lab Results  Component Value Date   CREATININE 1.01 01/22/2014   BUN 14 01/22/2014   NA 136 01/22/2014   K 3.8 01/22/2014   CL 104 01/22/2014   CO2 24 01/22/2014   Lab Results  Component Value Date   WBC 13.4* 01/22/2014   HGB 14.1 01/22/2014   HCT 39.3 01/22/2014   MCV 85.6 01/22/2014   PLT 210 01/22/2014    IMAGING: Repeat CTH demonstrates blossoming of bilateral orbito-frontal contusions. Otherwise stable.  IMPRESSION: - 33 y.o. male s/p ?fall with brain contusions, neurologically intact  PLAN: - Cont observation in ICU - Repeat CTH again tomorrow. When contusions are stable and pt remains neurologically stable, can likely transfer from ICU.

## 2014-01-24 MED ORDER — ENSURE COMPLETE PO LIQD
237.0000 mL | Freq: Three times a day (TID) | ORAL | Status: DC
  Administered 2014-01-24 – 2014-01-28 (×9): 237 mL via ORAL

## 2014-01-24 NOTE — Progress Notes (Signed)
Patient with frequent sinus pauses about 2-3 seconds long with the longest pause being 3.25 seconds.  Pauses are occuring approx every 3-4 minutes.  Patient is asymptomatic and sleeping. HR is 35 to 50bpm, which is abnormal compared to previous nights. Notified Dr Annette Stable who ordered an EKG and to check thyroid panel. Will continue to monitor.  Surabhi Gadea, [place] Marie, RN 01/24/2014 11:33 PM

## 2014-01-24 NOTE — Progress Notes (Signed)
Subjective: Patient sitting up in chair, complains of some mild vertex headache, being treated with Tylenol when necessary. Patient's wife and patient's grandparents at bedside. Patient's wife notes that he does have some mild nausea, and his grandparents note that his by mouth intake has been limited.  Follow-up CT scan of the brain without contrast yesterday showed expected evolution of bifrontal hemorrhagic cerebral contusions, with clearing of minimal traumatic subarachnoid hemorrhage and subdural hematoma.  Objective: Vital signs in last 24 hours: Filed Vitals:   01/24/14 0746 01/24/14 0800 01/24/14 1000 01/24/14 1157  BP:  109/65 108/60   Pulse:  59 63   Temp: 98.4 F (36.9 C)   98.6 F (37 C)  TempSrc: Oral   Oral  Resp:  14 14   Height:      Weight:      SpO2:  98% 97%     Intake/Output from previous day: 12/27 0701 - 12/28 0700 In: 3070 [P.O.:670; I.V.:2400] Out: -  Intake/Output this shift: Total I/O In: 540 [P.O.:240; I.V.:300] Out: -   Physical Exam:  Lethargic, easily aroused. Oriented to name, hospital, and December 2015. Following commands. Speech fluent. Good comprehension. Pupils equal, round, reactive to light, and about 3 mm bilaterally. EOMI. Facial movements symmetrical. Moving all 4 extremities well. No drift of upper extremities.  CBC  Recent Labs  01/22/14 0530  WBC 13.4*  HGB 14.1  HCT 39.3  PLT 210   BMET  Recent Labs  01/22/14 0530  NA 136  K 3.8  CL 104  CO2 24  GLUCOSE 136*  BUN 14  CREATININE 1.01  CALCIUM 9.0    Studies/Results: Ct Head Wo Contrast  01/23/2014   CLINICAL DATA:  33 year old male with acute intracranial hemorrhage, skull fracture, undetermined trauma mechanism. Initial encounter.  EXAM: CT HEAD WITHOUT CONTRAST  TECHNIQUE: Contiguous axial images were obtained from the base of the skull through the vertex without intravenous contrast.  COMPARISON:  Head CT 01/22/2014  FINDINGS: Nondisplaced anterior frontal bone  skull fracture tracking from the confluence of sutures at the vertex caudally just to the left of midline, and crossing midline inferiorly into the right frontal bone. No definite involvement of the frontal sinuses which are both hypoplastic. Broad-based scalp hematoma at the vertex has significantly progressed, and is up to 11 mm in thickness circumferentially.  No other skull fracture identified. Mastoids and tympanic cavities are clear. Fluid in mucosal thickening in the paranasal sinuses has not significantly changed and is favored to be inflammatory rather than posttraumatic.  Visualized orbit soft tissues are within normal limits.  Parafalcine subdural hematoma is small in volume and stable. Inferior bifrontal and left anterior temporal lobe hemorrhagic contusions with expected evolution; increased hyperdense blood products and increased conspicuity of surrounding edema. No significant associated mass effect.  In addition a small right anterior convexity subdural hematoma has regressed (series 2, image 17 versus series 2 L1 image 16 previously).  Interval regression of trace subarachnoid hemorrhage. No intraventricular hemorrhage or ventriculomegaly. Basilar cisterns remain patent and appear normal. No new intracranial hemorrhage or mass effect. No superimposed acute cortically based infarct identified. No suspicious intracranial vascular hyperdensity.  IMPRESSION: 1. Expected evolution of inferior bifrontal and anterior left temporal lobe hemorrhagic contusions. No significant mass effect. 2. Trace subarachnoid hemorrhage has regressed. Small right anterior frontal convexity subdural hemorrhage has regressed. Small volume parafalcine subdural hemorrhage is stable. 3. No new intracranial abnormality identified. 4. Nondisplaced anterior skull fracture tracking from the vertex inferiorly. Progressed broad-based scalp  hematoma. 5. Paranasal sinus opacification and fluid favored to be inflammatory rather than  posttraumatic, with no facial fracture identified.   Electronically Signed   By: Lars Pinks M.D.   On: 01/23/2014 09:16    Assessment/Plan: Neurologically stable following head injury 2 days ago with vertex skull fracture and bifrontal hemorrhagic cerebral contusions. Mild to moderate postconcussive symptoms including headache, nausea, relative anorexia, etc. present. Continuing IV fluids. We'll check CT of the brain without contrast in a.m.  Spoke with patient's wife and grandparents about the nature of his injury, plans for continued care and evaluation, and that I will be out of town until Tuesday, January 5. In the interim his neurosurgical care will be assigned to Dr. Kary Kos.  I will need to see him back in follow-up in the office in 2-3 weeks following discharge.   Randy Spangle, MD 01/24/2014, 12:22 PM

## 2014-01-25 ENCOUNTER — Encounter (HOSPITAL_COMMUNITY): Payer: Self-pay

## 2014-01-25 ENCOUNTER — Inpatient Hospital Stay (HOSPITAL_COMMUNITY): Payer: 59

## 2014-01-25 LAB — T4, FREE: Free T4: 0.82 ng/dL (ref 0.80–1.80)

## 2014-01-25 LAB — TSH: TSH: 1.01 u[IU]/mL (ref 0.350–4.500)

## 2014-01-25 NOTE — Consult Note (Signed)
Reason for Consult: Marked sinus bradycardia with pauses up to 3.2 seconds asymptomatic status post head trauma/syncope Referring Physician: Dr. Bernita Buffy is an 33 y.o. male.  HPI: Patient is 33 year old male with past medical history significant for tongue cancer status post partial glossectomy in the past status post chemotherapy and radiation, hypercholesteremia, hypothyroidism, was admitted on 01/22/2014 following fall at work sustaining fracture of the vertex of the calvarium, bifrontal hemorrhagic contusion, and very small subdural hematoma around the frontal lobe. Patient was treated conservatively with improvement in his symptoms. Patient does not recall any events prior to fall/syncopal episode. Patient denies any palpitations prior to fall. Patient presently complains of mild frontal headache. Cardiologic consultation is called as patient was noted to have marked sinus bradycardia with heart rate in 30s to 40s and pauses up to maximum 3.2 ms which are asymptomatic. Patient denies any history of syncope in the past. Patient denies any chest pain nausea vomiting diaphoresis. Denies PND orthopnea leg swelling.  Past Medical History  Diagnosis Date  . High triglycerides   . Gout   . Staph skin infection     Elbow- resolved.    . Head and neck cancer     tongue cancer  . Stones in the urinary tract   . GERD (gastroesophageal reflux disease)     occ  . History of radiation therapy 06/02/12- 07/14/12    oral tongue and bilateral neck, 60 gray in 30 fx  . Tongue cancer   . Xerostomia 11/30/2012  . PONV (postoperative nausea and vomiting)     tongue swelled-emergency trach  . Hypothyroidism     radiation- not working  . History of kidney stones   . Dry mouth     radiation  . Ringing in ears     Past Surgical History  Procedure Laterality Date  . Mouth surgery      biopsy- (08/06/2011 Dr. Hoyt Koch), also had tissue graft (oral) 2010  . Glossectomy    . Glossectomy  08/22/2011   Procedure: GLOSSECTOMY;  Surgeon: [name], MD;  Location: [facility];  Service: ENT;  Laterality: Right;  WITH FROZEN SECTION  . Glossectomy  09/06/2011    Procedure: GLOSSECTOMY;  Surgeon: [name], MD;  Location[facility];  Service: ENT;  Laterality: Right;  Right Tongue Re-Excision  . Radical neck dissection  09/06/2011    Procedure: RADICAL NECK DISSECTION;  Surgeon: [name], MD;  Location: [facility];  Service: ENT;  Laterality: Right;  Right Modified Neck Dissection   . Hemiglossectomy N/A 03/23/2012    Procedure: BIOPSY OF TONGUE WITH FROZEN SECTION AND RIGHT HEMIGLOSSECTOMY;  Surgeon: [name], MD;  Location: [place];  Service: ENT;  Laterality: N/A;  . Tracheostomy tube placement N/A 03/23/2012    Procedure: TRACHEOSTOMY;  Surgeon: [name], MD;  Location: [place];  Service: ENT;  Laterality: N/A;  . Tongue biopsy    . Tracheostomy closure      14  . Gastrostomy w/ feeding tube  2/14  . Radical neck dissection Left 04/23/2012    Procedure: LEFT MODIFIED  NECK DISSECTION;  Surgeon: [name], MD;  Location: [place];  Service: ENT;  Laterality: Left;  Left Supra Omo Hyoid Neck Dissection   . Cystoscopy with retrograde pyelogram, ureteroscopy and stent placement Left 04/16/2013    Procedure: CYSTOSCOPY WITH RETROGRADE PYELOGRAM, URETEROSCOPY AND STENT PLACEMENT;  Surgeon: Molli Hazard, MD;  Location: [facility];  Service: Urology;  Laterality: Left;  . Holmium laser  application Left 8/52/7782    Procedure: HOLMIUM LASER APPLICATION;  Surgeon: Molli Hazard, MD;  Location: [facility];  Service: Urology;  Laterality: Left;    Family History  Problem Relation Age of Onset  . Rheum arthritis Mother   . Asthma Father   . Cancer Maternal Grandmother   . Diabetes Sister   . Diabetes Brother     Social History:  reports that he quit smoking about 2 years ago. His smoking use included Cigars. He has never used smokeless tobacco. He reports that he does not drink alcohol or use illicit  drugs.  Allergies: No Known Allergies  Medications: I have reviewed the patient's current medications.  Results for orders placed or performed during the hospital encounter of 01/22/14 (from the past 48 hour(s))  TSH     Status: None   Collection Time: 01/25/14 12:40 AM  Result Value Ref Range   TSH 1.010 0.350 - 4.500 uIU/mL  T4, free     Status: None   Collection Time: 01/25/14 12:40 AM  Result Value Ref Range   Free T4 0.82 0.80 - 1.80 ng/dL    Comment: Performed at [facility] Contrast  01/25/2014   CLINICAL DATA:  Followup intracranial hemorrhage.  EXAM: CT HEAD WITHOUT CONTRAST  TECHNIQUE: Contiguous axial images were obtained from the base of the skull through the vertex without intravenous contrast.  COMPARISON:  Prior CT from 01/23/2014  FINDINGS: Acute nondisplaced calvarial fracture again noted, stable.  Overlying scalp hematoma again noted, similar to prior.  Small right parafalcine subdural hematoma not significantly changed in size measuring up to 3 mm and diameter. No associated mass effect.  There has been continued interval evolution of bifrontal hemorrhagic contusions. Overall amount of hyperdense blood products is similar to prior with increased associated vasogenic edema. No significant associated mass effect.  Tiny subdural hematoma overlying the right frontal lobe again seen measuring up to 2 mm. No significant mass effect.  Previously seen subarachnoid hemorrhage no longer definitely seen. No intraventricular hemorrhage. No hydrocephalus. Basilar cisterns remain patent. No acute large vessel territory infarct.  No acute abnormality seen about the orbits.  Fluid and mucosal thickening within the paranasal sinuses has not significantly changed relative to prior study.  Mastoid air cells remain clear.  Middle ear cavities clear.  IMPRESSION: 1. Continued expected interval evolution of inferior bifrontal and anterior left temporal hemorrhagic contusions.  No significant mass effect. 2. Persistent small anterior right frontal convexity subdural hematoma with small volume right parafalcine subdural hemorrhage. Subarachnoid hemorrhage has resolved. 3. No new intracranial abnormality. 4. Stable nondisplaced nondepressed frontal calvarial fracture. 5. Persistent mucosal thickening with fluid within the paranasal sinuses, likely inflammatory in nature.   Electronically Signed   By: Jeannine Boga M.D.   On: 01/25/2014 06:26    Review of Systems  Constitutional: Negative for fever and chills.  Eyes: Negative for blurred vision, double vision and photophobia.  Respiratory: Negative for cough and hemoptysis.   Cardiovascular: Negative for chest pain, palpitations, orthopnea and claudication.  Gastrointestinal: Negative for nausea, vomiting and abdominal pain.  Genitourinary: Negative for dysuria.  Neurological: Positive for headaches. Negative for dizziness.   Blood pressure 109/76, pulse 49, temperature 98.4 F (36.9 C), temperature source Oral, resp. rate 15, height 6\' 2"  (1.88 m), weight 96.7 kg (213 lb 3 oz), SpO2 99 %. Physical Exam  Constitutional: He is oriented to person, place, and time.  HENT:  Head: Normocephalic and atraumatic.  Eyes: Conjunctivae are normal. Pupils are equal, round, and reactive to light. Left eye exhibits no discharge. No scleral icterus.  Neck: Neck supple. No JVD present. No tracheal deviation present. No thyromegaly present.  Cardiovascular: Normal rate and regular rhythm.  Exam reveals no friction rub.   No murmur heard. Respiratory: Effort normal and breath sounds normal. No respiratory distress. He has no wheezes. He has no rales.  GI: Soft. Bowel sounds are normal. He exhibits no distension. There is no tenderness. There is no rebound.  Musculoskeletal: He exhibits no edema or tenderness.  Neurological: He is alert and oriented to person, place, and time.    Assessment/Plan: Marked sinus  bradycardia/pauses probably secondary to cerebral contusion skull fracture and traumatic subarachnoid hemorrhage and subdural hematoma Status post syncope rule out cardiac arrhythmias History of CA of tongue status post resection, chemotherapy and radiation in the past Hypothyroidism stable History of tobacco abuse Plan Continue present conservative management. Continue telemetry monitoring  Check 2-D echo No indication for pacemaker at this point.   Coretta Leisey N 01/25/2014, 12:46 PM

## 2014-01-25 NOTE — Progress Notes (Signed)
  Echocardiogram 2D Echocardiogram has been performed.  Olisa Quesnel FRANCES 01/25/2014, 3:28 PM

## 2014-01-25 NOTE — Progress Notes (Signed)
Patient ID: [name], male   DOB: Apr 15, 1980, 33 y.o.   MRN: [id number] Patient is doing well complaint of minimal headache minimal nausea still having some sinus bradycardic with positive area admission was due to a syncopal episode while at work.  Strength out of 5 pupils equal no prior drift  Repeat CT scan stable minimal mass effect  Cardiology consult.. continuing the ICU

## 2014-01-26 NOTE — Progress Notes (Signed)
UR completed.  Laurita Peron, RN BSN [facility] CCM Trauma/Neuro ICU Case Manager [phone]  

## 2014-01-26 NOTE — Progress Notes (Signed)
Patient ID: [name], male   DOB: Sep 15, 1980, 33 y.o.   MRN: [id number] Patient seems to be doing well this morning awake alert oriented strength 5 out of 5 in animal headache minimal nausea  Okay with me to transfer from the unit if it's okay with cardiology  Continue therapy

## 2014-01-26 NOTE — Progress Notes (Signed)
Subjective:  Patient denies any chest pain shortness of breath and dizziness. Headache improved. Occasional episodes of bradycardia and pauses up to 2 ms asymptomatic. 2-D echo showed normal LV systolic function with no significant valvular disease  Objective:  Vital Signs in the last 24 hours: Temp:  [97.8 F (36.6 C)-98.6 F (37 C)] 98.1 F (36.7 C) (12/30 1528) Pulse Rate:  [43-75] 50 (12/30 1600) Resp:  [10-24] 13 (12/30 1600) BP: (96-128)/(54-78) 103/58 mmHg (12/30 1600) SpO2:  [95 %-100 %] 100 % (12/30 1600)  Intake/Output from previous day: 12/29 0701 - 12/30 0700 In: 2500 [P.O.:200; I.V.:2300] Out: 1525 [Urine:1525] Intake/Output from this shift: Total I/O In: 900 [I.V.:900] Out: -   Physical Exam: Neck: no adenopathy, no carotid bruit, no JVD and supple, symmetrical, trachea midline Lungs: clear to auscultation bilaterally Heart: regular rate and rhythm, S1, S2 normal, no murmur, click, rub or gallop Abdomen: soft, non-tender; bowel sounds normal; no masses,  no organomegaly Extremities: extremities normal, atraumatic, no cyanosis or edema  Lab Results: No results for input(s): WBC, HGB, PLT in the last 72 hours. No results for input(s): NA, K, CL, CO2, GLUCOSE, BUN, CREATININE in the last 72 hours. No results for input(s): TROPONINI in the last 72 hours.  Invalid input(s): CK, MB Hepatic Function Panel No results for input(s): PROT, ALBUMIN, AST, ALT, ALKPHOS, BILITOT, BILIDIR, IBILI in the last 72 hours. No results for input(s): CHOL in the last 72 hours. No results for input(s): PROTIME in the last 72 hours.  Imaging: Imaging results have been reviewed and Ct Head Wo Contrast  01/25/2014   CLINICAL DATA:  Followup intracranial hemorrhage.  EXAM: CT HEAD WITHOUT CONTRAST  TECHNIQUE: Contiguous axial images were obtained from the base of the skull through the vertex without intravenous contrast.  COMPARISON:  Prior CT from 01/23/2014  FINDINGS: Acute nondisplaced  calvarial fracture again noted, stable.  Overlying scalp hematoma again noted, similar to prior.  Small right parafalcine subdural hematoma not significantly changed in size measuring up to 3 mm and diameter. No associated mass effect.  There has been continued interval evolution of bifrontal hemorrhagic contusions. Overall amount of hyperdense blood products is similar to prior with increased associated vasogenic edema. No significant associated mass effect.  Tiny subdural hematoma overlying the right frontal lobe again seen measuring up to 2 mm. No significant mass effect.  Previously seen subarachnoid hemorrhage no longer definitely seen. No intraventricular hemorrhage. No hydrocephalus. Basilar cisterns remain patent. No acute large vessel territory infarct.  No acute abnormality seen about the orbits.  Fluid and mucosal thickening within the paranasal sinuses has not significantly changed relative to prior study.  Mastoid air cells remain clear.  Middle ear cavities clear.  IMPRESSION: 1. Continued expected interval evolution of inferior bifrontal and anterior left temporal hemorrhagic contusions. No significant mass effect. 2. Persistent small anterior right frontal convexity subdural hematoma with small volume right parafalcine subdural hemorrhage. Subarachnoid hemorrhage has resolved. 3. No new intracranial abnormality. 4. Stable nondisplaced nondepressed frontal calvarial fracture. 5. Persistent mucosal thickening with fluid within the paranasal sinuses, likely inflammatory in nature.   Electronically Signed   By: Jeannine Boga M.D.   On: 01/25/2014 06:26    Cardiac Studies:  Assessment/Plan:  Status post Marked sinus bradycardia/pauses probably secondary to cerebral contusion skull fracture and traumatic subarachnoid hemorrhage and subdural hematoma improved Status post syncope rule out cardiac arrhythmias History of CA of tongue status post resection, chemotherapy and radiation in the  past Hypothyroidism stable History  of tobacco abuse Plan Okay to transfer to telemetry from cardiac point of view Increase ambulation as tolerated if okay with neurosurgery  LOS: 4 days    [name] N 01/26/2014, 6:28 PM

## 2014-01-27 NOTE — Progress Notes (Signed)
Report recd from [place], [place] in [place].  Will monitor for pt's arrival to unit   Angeline Slim I 01/27/2014 1:54 PM

## 2014-01-27 NOTE — Progress Notes (Signed)
Subjective:  Patient denies any chest pain shortness of breath palpitations and dizziness.  Complains of mild frontal headache.  Patient had occasional episodes of pauses asymptomatic at rest.  No arrhythmias noted.  Objective:  Vital Signs in the last 24 hours: Temp:  [97.3 F (36.3 C)-98.3 F (36.8 C)] 97.3 F (36.3 C) (12/31 1233) Pulse Rate:  [37-61] 51 (12/31 1200) Resp:  [13-19] 14 (12/31 1200) BP: (96-116)/(57-92) 102/60 mmHg (12/31 1200) SpO2:  [95 %-100 %] 98 % (12/31 1200)  Intake/Output from previous day: 12/30 0701 - 12/31 0700 In: 2300 [I.V.:2300] Out: -  Intake/Output from this shift: Total I/O In: 600 [I.V.:600] Out: -   Physical Exam: Neck: no adenopathy, no carotid bruit, no JVD and supple, symmetrical, trachea midline Lungs: clear to auscultation bilaterally Heart: regular rate and rhythm, S1, S2 normal, no murmur, click, rub or gallop Abdomen: soft, non-tender; bowel sounds normal; no masses,  no organomegaly Extremities: extremities normal, atraumatic, no cyanosis or edema  Lab Results: No results for input(s): WBC, HGB, PLT in the last 72 hours. No results for input(s): NA, K, CL, CO2, GLUCOSE, BUN, CREATININE in the last 72 hours. No results for input(s): TROPONINI in the last 72 hours.  Invalid input(s): CK, MB Hepatic Function Panel No results for input(s): PROT, ALBUMIN, AST, ALT, ALKPHOS, BILITOT, BILIDIR, IBILI in the last 72 hours. No results for input(s): CHOL in the last 72 hours. No results for input(s): PROTIME in the last 72 hours.  Imaging: Imaging results have been reviewed and No results found.  Cardiac Studies:  Assessment/Plan:  Status post Marked sinus bradycardia/pauses probably secondary to cerebral contusion skull fracture and traumatic subarachnoid hemorrhage and subdural hematoma improved Status post syncope rule out cardiac arrhythmias History of CA of tongue status post resection, chemotherapy and radiation in the  past Hypothyroidism stable History of tobacco abuse Plan Okay to transfer to telemetry I will sign off please call if needed Follow-up with me in 2-4 weeks  LOS: 5 days    [name] N 01/27/2014, 12:45 PM

## 2014-01-27 NOTE — Progress Notes (Signed)
Pt arrived to unit per wheelchair with Nira Conn, RN in [place]. No acute distress noted. Assessment performed as charted. Vss.  Telemetry placed. Will monitor pt closely   Angeline Slim I 01/27/2014 2:39 PM

## 2014-01-27 NOTE — Progress Notes (Signed)
Patient ID: [name], male   DOB: January 14, 1981, 33 y.o.   MRN: [id number] Doing well, neuro stable   Transfer to floor

## 2014-01-27 NOTE — Progress Notes (Signed)
Pt. Had a 2.46 pause and has been brady three to four times in the last hour. Called MD to make aware and awaiting call back. Joaquin Bend E, [place] 01/27/2014 2342

## 2014-01-28 MED ORDER — HYDROCODONE-ACETAMINOPHEN 5-325 MG PO TABS
1.0000 | ORAL_TABLET | ORAL | Status: DC | PRN
Start: 2014-01-28 — End: 2014-03-16

## 2014-01-28 NOTE — Progress Notes (Signed)
Discharge instructions and prescription given to pt. Pt verbally acknowledged understanding. Pt voiced no questions when prompted. Pt to be transported home by private car by family member. Pt to be transported to lobby by nurse tech. Will monitor    Angeline Slim I 01/28/2014 12:52 PM

## 2014-01-28 NOTE — Progress Notes (Signed)
MD called and said that as long as patient is able to be woken up and neuro checks are stable that there is nothing that we can do. Joaquin Bend E, RN 01/28/2014 12:52 AM

## 2014-01-28 NOTE — Progress Notes (Signed)
MD still has not returned call. Calling again to alert about the pauses and brady that patient is experiencing. [name], [place] 01/28/2014 0045

## 2014-01-28 NOTE — Progress Notes (Signed)
Pt transported out of unit per wheelchair by nurse tech. No acute distress noted. Family member at aide with pt's personal belongings.  Angeline Slim I 01/28/2014 12:56 PM

## 2014-01-28 NOTE — Discharge Summary (Signed)
Physician Discharge Summary  Patient ID: [name] MRN: [id number] DOB/AGE: 10-26-1980 34 y.o.  Admit date: 01/22/2014 Discharge date: 01/28/2014  Admission Diagnoses: Traumatic brain injury, skull fracture, cerebral contusions, bradycardia  Discharge Diagnoses: Same Active Problems:   Head injury with skull fracture   Discharged Condition: good  Hospital Course: Dr. Sherwood Gambler admitted the patient with a traumatic brain injury, skull fractures and cerebral contusions on 01/22/2014. The patient was initially observed in the ICU. A follow-up PET CT has demonstrated expected evolution of his contusions. The patient had some bradycardia and was seen by cardiology who cleared the patient.  On 01/28/2014 the patient requested discharge to home. He lives with his wife and children. He was given oral and written discharge instructions. All his questions were answered.  Consults: Cardiology, neurosurgery Significant Diagnostic Studies: Head CT Treatments: Observation Discharge Exam: Blood pressure 114/68, pulse 62, temperature 97.8 F (36.6 C), temperature source Oral, resp. rate 16, height 6\' 2"  (1.88 m), weight 96.7 kg (213 lb 3 oz), SpO2 100 %. The patient is alert and oriented 3. His speech is normal. His strength is normal. His pupils are equal.  Disposition: Home  Discharge Instructions    Call MD for:  difficulty breathing, headache or visual disturbances    Complete by:  As directed      Call MD for:  extreme fatigue    Complete by:  As directed      Call MD for:  hives    Complete by:  As directed      Call MD for:  persistant dizziness or light-headedness    Complete by:  As directed      Call MD for:  persistant nausea and vomiting    Complete by:  As directed      Call MD for:  redness, tenderness, or signs of infection (pain, swelling, redness, odor or green/yellow discharge around incision site)    Complete by:  As directed      Call MD for:  severe uncontrolled  pain    Complete by:  As directed      Call MD for:  temperature >100.4    Complete by:  As directed      Diet - low sodium heart healthy    Complete by:  As directed      Discharge instructions    Complete by:  As directed   Call [phone] for a followup appointment. Take a stool softener while you are using pain medications.     Driving Restrictions    Complete by:  As directed   Do not drive for 2 weeks.     Increase activity slowly    Complete by:  As directed      Lifting restrictions    Complete by:  As directed   Do not lift more than 5 pounds. No excessive bending or twisting.     May shower / Bathe    Complete by:  As directed   He may shower after the pain she is removed 3 days after surgery. Leave the incision alone.     No wound care    Complete by:  As directed             Medication List    TAKE these medications        HYDROcodone-acetaminophen 5-325 MG per tablet  Commonly known as:  NORCO/VICODIN  Take 1-2 tablets by mouth every 4 (four) hours as needed for moderate pain.  levothyroxine 25 MCG tablet  Commonly known as:  SYNTHROID, LEVOTHROID  Take 25 mcg by mouth daily before breakfast.     simvastatin 20 MG tablet  Commonly known as:  ZOCOR  Take 20 mg by mouth every evening.         SignedOphelia Charter 01/28/2014, 10:43 AM

## 2014-02-03 ENCOUNTER — Ambulatory Visit
Admission: RE | Admit: 2014-02-03 | Discharge: 2014-02-03 | Disposition: A | Discharge status: Home / Self Care | Payer: 59 | Source: Ambulatory Visit | Attending: Radiation Oncology | Admitting: Radiation Oncology

## 2014-02-03 ENCOUNTER — Encounter: Payer: Self-pay | Admitting: Radiation Oncology

## 2014-02-03 VITALS — BP 119/73 | HR 81 | Temp 98.5°F | Resp 12 | Wt 204.6 lb

## 2014-02-03 DIAGNOSIS — C029 Malignant neoplasm of tongue, unspecified: Secondary | ICD-10-CM

## 2014-02-03 NOTE — Progress Notes (Signed)
He is currently in no pain. Pt complains of, Poor Appetite.  Pt has had dysphagia for solids. Pt reports a regular unmodified diet orally formula-pedisure/ensure-one-cans per day. Oral exam-Pt reports continued a dry mouth which he manages with gum and water.  He also reports he noticed a white coating on his tongue. Encouraged to take probiotics. Reports sputum is red tinted on occasion. Skin exam warm dry and intact.  Pt reported fell at work on 01/22/14 and was admitted to the hospital [facility]) for a head injury.  Pt received a fractured skull, traumatic brain injury and contusions.  Preceding the fall he was headed to the restroom and he doesn't remember anything else.  Workers said he just went limp and fell to the ground.   He was discharged on 01/28/14.  He has a follow up appointment with [facility] (neurologist) in the next few weeks.  Pt reports he has occasional headaches, denies nausea, vomiting, blurred vision, or vertigo.    Wt Readings from Last 3 Encounters:  02/03/14 204 lb 9.6 oz (92.806 kg)  01/22/14 213 lb 3 oz (96.7 kg)  10/28/13 220 lb 4.8 oz (99.927 kg)   BP Readings from Last 3 Encounters:  02/03/14 119/73  01/28/14 114/68  10/28/13 109/76   Pulse Readings from Last 3 Encounters:  02/03/14 81  01/28/14 62  10/28/13 78

## 2014-02-03 NOTE — Progress Notes (Signed)
Radiation Oncology         [phone]) [phone] ________________________________  Name: [name] MRN: [id number]  Date: 02/03/2014  DOB: 06-08-1980  Follow-Up Visit Note  CC: [name], MD  [name], MD    ICD-9-CM ICD-10-CM   1. Tongue cancer 141.9 C02.9     Diagnosis:   Recurrent oral tongue cancer  Interval Since Last Radiation:  18   months  Narrative:  The patient returns today for routine follow-up.  Interval history is significant for the patient passing out at work and  having  A head injury. According to the patient and his wife he fell on hard concrete. He remained in the hospital for proximally 5 days. He continues to have some soreness along the occipital skull area but is stopped taking his Vicodin. According to the patient's wife he was busy with vacation and then proceeded into work. He also had stopped taking his thyroid medication.  The patient will follow-up with Dr. Rita Ohara later this month. Patient is not allowed to drive at this time.  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 He can eat solid foods.  His appetite is rather poor. He lost several pounds while in the hospital recently.                               ALLERGIES:  has No Known Allergies.  Meds: Current Outpatient Prescriptions  Medication Sig Dispense Refill  . HYDROcodone-acetaminophen (NORCO/VICODIN) 5-325 MG per tablet Take 1-2 tablets by mouth every 4 (four) hours as needed for moderate pain. (Patient not taking: Reported on 02/03/2014) 100 tablet 0  . levothyroxine (SYNTHROID, LEVOTHROID) 25 MCG tablet Take 25 mcg by mouth daily before breakfast.    . simvastatin (ZOCOR) 20 MG tablet Take 20 mg by mouth every evening.      No current facility-administered medications for this encounter.    Physical Findings: The patient is in no acute distress. Patient is alert and oriented.  weight is 204 lb 9.6 oz (92.806 kg). His oral temperature is 98.5 F (36.9 C).  His blood pressure is 119/73 and his pulse is 81. His respiration is 12 and oxygen saturation is 100%. .  No palpable axillary supraclavicular or cervical adenopathy. The patient's scars are well healed. Examination of oral cavity reveals the teeth to be in good repair. The patient continues to have some trismus. Examination of the oral tongue and floor of mouth with visual inspection and palpation reveals no suspicious areas. Palpation along the base of tongue area reveals no suspicious induration.  Indirect mirror examination: The patient proceeded to undergo an indirect mirror examination.  The good view of the base of tongue was noted without any mucosal lesions. No lesions noted along the epiglottis. I was unable to visualize all of the vocal cords on exam today. Patient has a slight whitish coating to the dorsum of the tongue but no convincing evidence of candida  infection.  Lab Findings: Lab Results  Component Value Date   WBC 13.4* 01/22/2014   HGB 14.1 01/22/2014   HCT 39.3 01/22/2014   MCV 85.6 01/22/2014   PLT 210 01/22/2014    Radiographic Findings: Ct Head Wo Contrast  01/25/2014   CLINICAL DATA:  Followup intracranial hemorrhage.  EXAM: CT HEAD WITHOUT CONTRAST  TECHNIQUE: Contiguous axial images were obtained from the base of the skull through the vertex without intravenous contrast.  COMPARISON:  Prior CT from 01/23/2014  FINDINGS: Acute nondisplaced calvarial fracture again noted, stable.  Overlying scalp hematoma again noted, similar to prior.  Small right parafalcine subdural hematoma not significantly changed in size measuring up to 3 mm and diameter. No associated mass effect.  There has been continued interval evolution of bifrontal hemorrhagic contusions. Overall amount of hyperdense blood products is similar to prior with increased associated vasogenic edema. No significant associated mass effect.  Tiny subdural hematoma overlying the right frontal lobe again seen measuring  up to 2 mm. No significant mass effect.  Previously seen subarachnoid hemorrhage no longer definitely seen. No intraventricular hemorrhage. No hydrocephalus. Basilar cisterns remain patent. No acute large vessel territory infarct.  No acute abnormality seen about the orbits.  Fluid and mucosal thickening within the paranasal sinuses has not significantly changed relative to prior study.  Mastoid air cells remain clear.  Middle ear cavities clear.  IMPRESSION: 1. Continued expected interval evolution of inferior bifrontal and anterior left temporal hemorrhagic contusions. No significant mass effect. 2. Persistent small anterior right frontal convexity subdural hematoma with small volume right parafalcine subdural hemorrhage. Subarachnoid hemorrhage has resolved. 3. No new intracranial abnormality. 4. Stable nondisplaced nondepressed frontal calvarial fracture. 5. Persistent mucosal thickening with fluid within the paranasal sinuses, likely inflammatory in nature.   Electronically Signed   By: Jeannine Boga M.D.   On: 01/25/2014 06:26   Ct Head Wo Contrast  01/23/2014   CLINICAL DATA:  34 year old male with acute intracranial hemorrhage, skull fracture, undetermined trauma mechanism. Initial encounter.  EXAM: CT HEAD WITHOUT CONTRAST  TECHNIQUE: Contiguous axial images were obtained from the base of the skull through the vertex without intravenous contrast.  COMPARISON:  Head CT 01/22/2014  FINDINGS: Nondisplaced anterior frontal bone skull fracture tracking from the confluence of sutures at the vertex caudally just to the left of midline, and crossing midline inferiorly into the right frontal bone. No definite involvement of the frontal sinuses which are both hypoplastic. Broad-based scalp hematoma at the vertex has significantly progressed, and is up to 11 mm in thickness circumferentially.  No other skull fracture identified. Mastoids and tympanic cavities are clear. Fluid in mucosal thickening in the  paranasal sinuses has not significantly changed and is favored to be inflammatory rather than posttraumatic.  Visualized orbit soft tissues are within normal limits.  Parafalcine subdural hematoma is small in volume and stable. Inferior bifrontal and left anterior temporal lobe hemorrhagic contusions with expected evolution; increased hyperdense blood products and increased conspicuity of surrounding edema. No significant associated mass effect.  In addition a small right anterior convexity subdural hematoma has regressed (series 2, image 17 versus series 2 L1 image 16 previously).  Interval regression of trace subarachnoid hemorrhage. No intraventricular hemorrhage or ventriculomegaly. Basilar cisterns remain patent and appear normal. No new intracranial hemorrhage or mass effect. No superimposed acute cortically based infarct identified. No suspicious intracranial vascular hyperdensity.  IMPRESSION: 1. Expected evolution of inferior bifrontal and anterior left temporal lobe hemorrhagic contusions. No significant mass effect. 2. Trace subarachnoid hemorrhage has regressed. Small right anterior frontal convexity subdural hemorrhage has regressed. Small volume parafalcine subdural hemorrhage is stable. 3. No new intracranial abnormality identified. 4. Nondisplaced anterior skull fracture tracking from the vertex inferiorly. Progressed broad-based scalp hematoma. 5. Paranasal sinus opacification and fluid favored to be inflammatory rather than posttraumatic, with no facial fracture identified.   Electronically Signed   By: Lars Pinks M.D.   On: 01/23/2014 09:16   Ct Head Wo  Contrast  01/22/2014   CLINICAL DATA:  Acute onset of syncope. Loss of consciousness for 30 minutes. Initial encounter.  EXAM: CT HEAD WITHOUT CONTRAST  TECHNIQUE: Contiguous axial images were obtained from the base of the skull through the vertex without intravenous contrast.  COMPARISON:  CT of the neck performed 09/06/2013  FINDINGS: Multiple  small contusions and intraparenchymal hemorrhage are noted along the inferior frontal lobes bilaterally, with associated subarachnoid blood. Contusions measure up to 8 mm in size. These are suspicious for a contrecoup injury, and likely correspond to the calvarial fracture described below.  Additional subarachnoid blood is seen tracking superiorly along the right frontal lobe. There is also a small amount of subdural blood tracking along the right side of the anterior falx cerebri, measuring 4 mm in thickness. A small amount of subarachnoid blood is seen extending along the left Sylvian fissure, and about the medial and lateral aspects of the left frontoparietal region.  Mild cerebellar atrophy is noted. No significant midline shift is seen. There is mild vasogenic edema within the inferior frontal lobes bilaterally.  There appears to be an incomplete fracture line extending across the upper midline frontal calvarium, extending from the sagittal suture. The visualized portions of the orbits are within normal limits. Fluid is noted filling the right side of the sphenoid sinus and the maxillary sinuses, only slightly increased in attenuation, without evidence of associated fracture. The remaining paranasal sinuses and mastoid air cells are well-aerated. Mild soft tissue swelling is suggested along the vertex.  IMPRESSION: 1. Unusual pattern of multiple contusions and intraparenchymal hemorrhage along the inferior frontal lobes bilaterally, with associated subarachnoid blood. This is suspicious for a contrecoup injury, and likely corresponds to the calvarial fracture described below. Mild associated vasogenic edema seen. 2. Additional subarachnoid blood seen tracking superiorly along the right frontal lobe, along the left Sylvian fissure, and about the medial and lateral aspects of the left frontoparietal region. 3. Small amount of subdural blood tracking along the right side of the anterior falx cerebri, measuring 4  mm in thickness. 4. Incomplete fracture line extending across the upper midline frontal calvarium, extending from the sagittal suture. 5. Mild soft tissue swelling suggested along the vertex. 6. Mild cerebellar atrophy noted. 7. Fluid noted filling the right side of the sphenoid sinus and the maxillary sinuses, of slightly increased attenuation, without evidence of blood or associated fracture.  Critical Value/emergent results were called by telephone at the time of interpretation on 01/22/2014 at 6:40 am to Dr. Rolland Porter, who verbally acknowledged these results.   Electronically Signed   By: Garald Balding M.D.   On: 01/22/2014 07:09   Ct Cervical Spine Wo Contrast  01/22/2014   CLINICAL DATA:  Fall.  Patient states no trauma.  EXAM: CT CERVICAL SPINE WITHOUT CONTRAST  TECHNIQUE: Multidetector CT imaging of the cervical spine was performed without intravenous contrast. Multiplanar CT image reconstructions were also generated.  COMPARISON:  CT neck 09/06/2013  FINDINGS: Vertebral body alignment, heights and disc space heights are normal. There is minimal spondylosis present. Prevertebral soft tissues as well as a atlantoaxial articulation are normal. There is no fracture or subluxation. There are a few surgical clips over the soft tissues to the right at the C1-2 level. Remainder of the exam is unremarkable.  IMPRESSION: No acute findings.  Mild spondylosis of the cervical spine.   Electronically Signed   By: Marin Olp M.D.   On: 01/22/2014 08:00    Impression:  No evidence of recurrence  on clinical exam today  Plan:  Routine follow-up in June. The patient will be seen by ENT in March and medical oncology in the near future with blood work and scans scheduled.  ____________________________________ Blair Promise, MD

## 2014-02-17 ENCOUNTER — Other Ambulatory Visit: Payer: Self-pay | Admitting: Neurosurgery

## 2014-02-17 DIAGNOSIS — S0291XA Unspecified fracture of skull, initial encounter for closed fracture: Secondary | ICD-10-CM

## 2014-02-22 ENCOUNTER — Ambulatory Visit
Admission: RE | Admit: 2014-02-22 | Discharge: 2014-02-22 | Disposition: A | Discharge status: Home / Self Care | Payer: 59 | Source: Ambulatory Visit | Attending: Neurosurgery | Admitting: Neurosurgery

## 2014-02-22 DIAGNOSIS — S0291XA Unspecified fracture of skull, initial encounter for closed fracture: Secondary | ICD-10-CM

## 2014-03-16 ENCOUNTER — Telehealth: Payer: Self-pay | Admitting: Hematology and Oncology

## 2014-03-16 ENCOUNTER — Encounter: Payer: Self-pay | Admitting: Hematology and Oncology

## 2014-03-16 ENCOUNTER — Ambulatory Visit (HOSPITAL_BASED_OUTPATIENT_CLINIC_OR_DEPARTMENT_OTHER): Payer: 59 | Admitting: Hematology and Oncology

## 2014-03-16 ENCOUNTER — Other Ambulatory Visit (HOSPITAL_BASED_OUTPATIENT_CLINIC_OR_DEPARTMENT_OTHER): Payer: 59

## 2014-03-16 VITALS — BP 121/80 | HR 66 | Temp 97.6°F | Resp 18 | Ht 74.0 in | Wt 222.8 lb

## 2014-03-16 DIAGNOSIS — E038 Other specified hypothyroidism: Secondary | ICD-10-CM

## 2014-03-16 DIAGNOSIS — C029 Malignant neoplasm of tongue, unspecified: Secondary | ICD-10-CM

## 2014-03-16 DIAGNOSIS — Z85810 Personal history of malignant neoplasm of tongue: Secondary | ICD-10-CM

## 2014-03-16 DIAGNOSIS — R001 Bradycardia, unspecified: Secondary | ICD-10-CM

## 2014-03-16 DIAGNOSIS — E039 Hypothyroidism, unspecified: Secondary | ICD-10-CM

## 2014-03-16 HISTORY — DX: Bradycardia, unspecified: R00.1

## 2014-03-16 LAB — COMPREHENSIVE METABOLIC PANEL (CC13)
ALK PHOS: 74 U/L (ref 40–150)
ALT: 21 U/L (ref 0–55)
ANION GAP: 7 meq/L (ref 3–11)
AST: 19 U/L (ref 5–34)
Albumin: 4.2 g/dL (ref 3.5–5.0)
BILIRUBIN TOTAL: 0.83 mg/dL (ref 0.20–1.20)
BUN: 11 mg/dL (ref 7.0–26.0)
CO2: 26 mEq/L (ref 22–29)
Calcium: 9.4 mg/dL (ref 8.4–10.4)
Chloride: 105 mEq/L (ref 98–109)
Creatinine: 0.9 mg/dL (ref 0.7–1.3)
EGFR: >90 mL/min/{1.73_m2} (ref >90)
GLUCOSE: 93 mg/dL (ref 70–140)
Potassium: 3.9 mEq/L (ref 3.5–5.1)
Sodium: 139 mEq/L (ref 136–145)
Total Protein: 7 g/dL (ref 6.4–8.3)

## 2014-03-16 LAB — CBC WITH DIFFERENTIAL/PLATELET
BASO%: 0.7 % (ref 0.0–2.0)
BASOS ABS: 0 10*3/uL (ref 0.0–0.1)
EOS%: 2.5 % (ref 0.0–7.0)
Eosinophils Absolute: 0.1 10*3/uL (ref 0.0–0.5)
HEMATOCRIT: 41.5 % (ref 38.4–49.9)
HEMOGLOBIN: 14.5 g/dL (ref 13.0–17.1)
LYMPH%: 9.4 % — AB (ref 14.0–49.0)
MCH: 30.3 pg (ref 27.2–33.4)
MCHC: 34.9 g/dL (ref 32.0–36.0)
MCV: 86.8 fL (ref 79.3–98.0)
MONO#: 0.5 10*3/uL (ref 0.1–0.9)
MONO%: 8.5 % (ref 0.0–14.0)
NEUT%: 78.9 % — AB (ref 39.0–75.0)
NEUTROS ABS: 4.5 10*3/uL (ref 1.5–6.5)
PLATELETS: 218 10*3/uL (ref 140–400)
RBC: 4.78 10*6/uL (ref 4.20–5.82)
RDW: 13 % (ref 11.0–14.6)
WBC: 5.7 10*3/uL (ref 4.0–10.3)
lymph#: 0.5 10*3/uL — ABNORMAL LOW (ref 0.9–3.3)

## 2014-03-16 LAB — TSH CHCC: TSH: 2.16 m(IU)/L (ref 0.320–4.118)

## 2014-03-16 LAB — T4, FREE: Free T4: 0.91 ng/dL (ref 0.80–1.80)

## 2014-03-16 NOTE — Assessment & Plan Note (Signed)
He has no signs and symptoms of recurrence. I recommend history, physical examination blood work in 6 months  Along with CT scan of the neck. I reinforced the importance of surveillance program by the radiation oncologist at [facility].

## 2014-03-16 NOTE — Progress Notes (Signed)
[facility] OFFICE PROGRESS NOTE  Patient Care Team: Elsie Lincoln, MD as PCP - General (Family Medicine) [name], MD as Attending Physician (Otolaryngology) Blair Promise, MD as Attending Physician (Radiation Oncology) Heath Lark, MD as Consulting Physician (Hematology and Oncology)  SUMMARY OF ONCOLOGIC HISTORY: Oncology History   Tongue cancer   Primary site: Lip and Oral Cavity (Right)   Staging method: AJCC 7th Edition   Clinical: Stage III (T1, N1, M0) signed by Heath Lark, MD on 12/15/2012 11:39 AM   Pathologic: Stage III (T1, N1, cM0) signed by Heath Lark, MD on 12/15/2012 11:39 AM   Summary: Stage III (T1, N1, cM0)  Tongue cancer, recurrence   Primary site: Lip and Oral Cavity (Right)   Staging method: AJCC 7th Edition   Clinical: Stage IVA (T1, N2c, M0) signed by Heath Lark, MD on 12/15/2012  1:13 PM   Pathologic: Stage IVA (T1, N2c, cM0) signed by Heath Lark, MD on 12/15/2012  1:14 PM   Summary: Stage IVA (T1, N2c, cM0)       Tongue cancer   08/22/2011 Surgery He had partial glossectomy which showed invasive SCC measured 1.3 cm   09/06/2011 Surgery He underwent right neck dissection which showed 1/24 LN involved and repeat resection of tongue was negative   03/23/2012 Surgery Repeat tngue resection showed well differentiated SCC 1.5 cm which invaded into the muscle   04/17/2012 Imaging PET/CT scan showed New adenopathy in the left neck, compatible with recurrent malignancy   04/23/2012 Surgery he underwent left neck LN dissection and 2/34 LN were positve   06/01/2012 - 07/13/2012 Chemotherapy Patient had 3 cycles of cisplatin   06/12/2012 - 07/14/2012 Radiation Therapy Patient completed RT   12/10/2012 Imaging Interval resolution of previous hypermetabolic cervical adenopathy. There is nonspecific asymmetric increased uptake within the right side of tongue   03/13/2013 Imaging PET/CT scan showed no evidence of disease recurrence. He was noted to have  incidental kidney stones    INTERVAL HISTORY: Please see below for problem oriented charting.  he is seen as his routine follow-up. He denies new lesions in his mouth. No new lymphadenopathy.  2 months ago, he had acute onset of loss of consciousness and fell backwards and  injured his head. He was in ICU for many days.  he denies any dysphagia. Complained of persistent dry mouth.  REVIEW OF SYSTEMS:   Constitutional: Denies fevers, chills or abnormal weight loss Eyes: Denies blurriness of vision Ears, nose, mouth, throat, and face: Denies mucositis or sore throat Respiratory: Denies cough, dyspnea or wheezes Cardiovascular: Denies palpitation, chest discomfort or lower extremity swelling Gastrointestinal:  Denies nausea, heartburn or change in bowel habits Skin: Denies abnormal skin rashes Lymphatics: Denies new lymphadenopathy or easy bruising Neurological:Denies numbness, tingling or new weaknesses Behavioral/Psych: Mood is stable, no new changes  All other systems were reviewed with the patient and are negative.  I have reviewed the past medical history, past surgical history, social history and family history with the patient and they are unchanged from previous note.  ALLERGIES:  has No Known Allergies.  MEDICATIONS:  Current Outpatient Prescriptions  Medication Sig Dispense Refill  . levothyroxine (SYNTHROID, LEVOTHROID) 25 MCG tablet Take 25 mcg by mouth daily before breakfast.    . simvastatin (ZOCOR) 20 MG tablet Take 20 mg by mouth every evening.      No current facility-administered medications for this visit.    PHYSICAL EXAMINATION: ECOG PERFORMANCE STATUS: 0 - Asymptomatic  Filed Vitals:  03/16/14 0854  BP: 121/80  Pulse: 66  Temp: 97.6 F (36.4 C)  Resp: 18   Filed Weights   03/16/14 0854  Weight: 222 lb 12.8 oz (101.061 kg)    GENERAL:alert, no distress and comfortable SKIN: skin color, texture, turgor are normal, no rashes or significant  lesions EYES: normal, Conjunctiva are pink and non-injected, sclera clear OROPHARYNX:no exudate, no erythema and lips, buccal mucosa,  Without new tongue lesions. There is postsurgical scar on his tongue. 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 LYMPH:  no palpable lymphadenopathy in the cervical, axillary or inguinal LUNGS: clear to auscultation and percussion with normal breathing effort HEART: regular rate & rhythm and no murmurs and no lower extremity edema ABDOMEN:abdomen soft, non-tender and normal bowel sounds Musculoskeletal:no cyanosis of digits and no clubbing  NEURO: alert & oriented x 3 with fluent speech, no focal motor/sensory deficits  LABORATORY DATA:  I have reviewed the data as listed    Component Value Date/Time   NA 139 03/16/2014 0838   NA 136 01/22/2014 0530   K 3.9 03/16/2014 0838   K 3.8 01/22/2014 0530   CL 104 01/22/2014 0530   CL 100 07/10/2012 1341   CO2 26 03/16/2014 0838   CO2 24 01/22/2014 0530   GLUCOSE 93 03/16/2014 0838   GLUCOSE 136* 01/22/2014 0530   GLUCOSE 118* 07/10/2012 1341   BUN 11.0 03/16/2014 0838   BUN 14 01/22/2014 0530   CREATININE 0.9 03/16/2014 0838   CREATININE 1.01 01/22/2014 0530   CALCIUM 9.4 03/16/2014 0838   CALCIUM 9.0 01/22/2014 0530   PROT 7.0 03/16/2014 0838   PROT 6.7 01/22/2014 0530   ALBUMIN 4.2 03/16/2014 0838   ALBUMIN 3.9 01/22/2014 0530   AST 19 03/16/2014 0838   AST 33 01/22/2014 0530   ALT 21 03/16/2014 0838   ALT 21 01/22/2014 0530   ALKPHOS 74 03/16/2014 0838   ALKPHOS 72 01/22/2014 0530   BILITOT 0.83 03/16/2014 0838   BILITOT 0.9 01/22/2014 0530   GFRNONAA >90 01/22/2014 0530   GFRAA >90 01/22/2014 0530    No results found for: SPEP, UPEP  Lab Results  Component Value Date   WBC 5.7 03/16/2014   NEUTROABS 4.5 03/16/2014   HGB 14.5 03/16/2014   HCT 41.5 03/16/2014   MCV 86.8 03/16/2014   PLT 218 03/16/2014      Chemistry      Component Value Date/Time   NA 139  03/16/2014 0838   NA 136 01/22/2014 0530   K 3.9 03/16/2014 0838   K 3.8 01/22/2014 0530   CL 104 01/22/2014 0530   CL 100 07/10/2012 1341   CO2 26 03/16/2014 0838   CO2 24 01/22/2014 0530   BUN 11.0 03/16/2014 0838   BUN 14 01/22/2014 0530   CREATININE 0.9 03/16/2014 0838   CREATININE 1.01 01/22/2014 0530      Component Value Date/Time   CALCIUM 9.4 03/16/2014 0838   CALCIUM 9.0 01/22/2014 0530   ALKPHOS 74 03/16/2014 0838   ALKPHOS 72 01/22/2014 0530   AST 19 03/16/2014 0838   AST 33 01/22/2014 0530   ALT 21 03/16/2014 0838   ALT 21 01/22/2014 0530   BILITOT 0.83 03/16/2014 0838   BILITOT 0.9 01/22/2014 0530      ASSESSMENT & PLAN:  Tongue cancer He has no signs and symptoms of recurrence. I recommend history, physical examination blood work in 6 months  Along with CT scan of the neck. I reinforced the importance  of surveillance program by the radiation oncologist at [facility].     Bradycardia  The patient has symptomatic bradycardia to the point that he has skull base fracture recently. He had extensive workup recently but no further follow-up regarding his bradycardia. I recommend cardiology consultation and he agrees.    Orders Placed This Encounter  Procedures  . CT Soft Tissue Neck W Contrast    Standing Status: Future     Number of Occurrences:      Standing Expiration Date: 06/16/2015    Order Specific Question:  Reason for Exam (SYMPTOM  OR DIAGNOSIS REQUIRED)    Answer:  tongue ca, exclude recurrence    Order Specific Question:  Preferred imaging location?    Answer:  [facility]  . CBC with Differential/Platelet    Standing Status: Future     Number of Occurrences:      Standing Expiration Date: 06/16/2015  . Comprehensive metabolic panel    Standing Status: Future     Number of Occurrences:      Standing Expiration Date: 06/16/2015  . T4, free    Standing Status: Future     Number of Occurrences:      Standing Expiration Date:  06/16/2015  . TSH    Standing Status: Future     Number of Occurrences:      Standing Expiration Date: 06/16/2015  . Ambulatory referral to Cardiology    Referral Priority:  Routine    Referral Type:  Consultation    Referral Reason:  Specialty Services Required    Requested Specialty:  Cardiology    Number of Visits Requested:  1   All questions were answered. The patient knows to call the clinic with any problems, questions or concerns. No barriers to learning was detected. I spent 15 minutes counseling the patient face to face. The total time spent in the appointment was 20 minutes and more than 50% was on counseling and review of test results     [facility], Aleeyah Bensen, MD 03/16/2014 10:04 AM

## 2014-03-16 NOTE — Assessment & Plan Note (Addendum)
The patient has symptomatic bradycardia to the point that he has skull base fracture recently. He had extensive workup recently but no further follow-up regarding his bradycardia. I recommend cardiology consultation and he agrees.

## 2014-03-16 NOTE — Telephone Encounter (Signed)
gv adn printed appt sched for March/June and August..Marland KitchenMarland KitchenPt sched for 3.15 @ 10:30am with Dr. Marlou Porch

## 2014-04-04 ENCOUNTER — Telehealth: Payer: Self-pay | Admitting: *Deleted

## 2014-04-04 NOTE — Telephone Encounter (Signed)
On 04-04-14 fax medical records to [facility] [place], [place], [place], it was all of Dr. Sondra Come notes.

## 2014-04-12 ENCOUNTER — Ambulatory Visit (INDEPENDENT_AMBULATORY_CARE_PROVIDER_SITE_OTHER): Payer: 59 | Admitting: Cardiology

## 2014-04-12 DIAGNOSIS — R001 Bradycardia, unspecified: Secondary | ICD-10-CM

## 2014-04-12 NOTE — Progress Notes (Signed)
     When reviewing records, , [name] has seen cardiologist, Dr. Terrence Dupont  The hospital setting during his fall. I spoke to [name] regarding this. He does not remember the encounter likely result of his traumatic brain injury. We have however given him contact information for Dr. Terrence Dupont so that he can follow-up with him.   No charge for today's visit.  Candee Furbish, MD

## 2014-05-25 ENCOUNTER — Other Ambulatory Visit: Payer: Self-pay | Admitting: Hematology and Oncology

## 2014-07-07 ENCOUNTER — Ambulatory Visit: Payer: 59 | Admitting: Radiation Oncology

## 2014-07-11 ENCOUNTER — Telehealth: Payer: Self-pay | Admitting: *Deleted

## 2014-07-11 NOTE — Telephone Encounter (Signed)
PT.'S INSURANCE WILL CONTINUE UNTIL 08/21/14. HE HAS LAB AND CT SCAN ON 09/13/14 WITH A MD VISIT ON 09/15/14. COULD THESE APPOINTMENTS BE MOVED UP BEFORE 08/21/14 SO PT. WILL STILL HAVE INSURANCE.

## 2014-07-12 ENCOUNTER — Telehealth: Payer: Self-pay | Admitting: Hematology and Oncology

## 2014-07-12 ENCOUNTER — Telehealth: Payer: Self-pay | Admitting: *Deleted

## 2014-07-12 NOTE — Telephone Encounter (Signed)
s.w pt and advised on Aug appts moved to [place]...Marland KitchenMarland Kitchenpt ok and aware of d.t

## 2014-07-12 NOTE — Telephone Encounter (Signed)
I am not sure who can call to move CT and labs to 7/18 and see me 7/19 at 830 am

## 2014-07-12 NOTE — Telephone Encounter (Signed)
Informed pt of order sent to Scheduler to move lab/CT to 7/18 and MD to 7/19.  He should be getting a call from Scheduling w/ dates and times.   He verbalized understanding.

## 2014-07-13 ENCOUNTER — Ambulatory Visit
Admission: RE | Admit: 2014-07-13 | Discharge: 2014-07-13 | Disposition: A | Discharge status: Home / Self Care | Payer: 59 | Source: Ambulatory Visit | Attending: Radiation Oncology | Admitting: Radiation Oncology

## 2014-07-13 VITALS — BP 109/74 | HR 78 | Temp 97.6°F | Resp 12 | Wt 213.8 lb

## 2014-07-13 DIAGNOSIS — C029 Malignant neoplasm of tongue, unspecified: Secondary | ICD-10-CM

## 2014-07-13 NOTE — Addendum Note (Signed)
Encounter addended by: Gery Pray, MD on: 07/13/2014  9:40 AM<BR>     Documentation filed: Clinical Notes

## 2014-07-13 NOTE — Progress Notes (Signed)
Radiation Oncology         [phone]) [phone] ________________________________  Name: SHAIDEN ALDOUS MRN: [id number]  Date: 07/13/2014  DOB: 1980/06/28  Follow-Up Visit Note  CC: [name], MD  [name], MD   Diagnosis:  Cary Lothrop is a 34 year old male presenting to clinic in regards to his recurrent oral tongue cancer.  Interval Since Last Radiation:  23   months  Narrative:  The patient returns today for routine follow-up. Denies blood in the mouth, unusual pain of the mouth. The patient reports eating "wet" foods. "I don't do bread. Meat's hard to eat sometimes," when sharing what he is currently capable of eating. "Since I hit my head, I can't smell or taste anything." The patient occurred the head injury about six months previous to today's appointment. Patient reports no decline in energy level, however currently expiercing lack of appetite. 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 Fulco reports that he continues to perform trismus  mouth exercise, as previously instructed. The patient reports no difficulty with verbal communicaiton.   He has switched job positions [place] working with [employer] [place]               ALLERGIES:  has No Known Allergies.  Meds: Current Outpatient Prescriptions  Medication Sig Dispense Refill  . levothyroxine (SYNTHROID, LEVOTHROID) 25 MCG tablet TAKE ONE (1) TABLET EACH DAY BEFORE BREAKFAST 30 tablet 6  . simvastatin (ZOCOR) 20 MG tablet Take 20 mg by mouth every evening.      No current facility-administered medications for this encounter.    Physical Findings: The patient is in no acute distress. Patient is alert and oriented X3.  weight is 213 lb 12.8 oz (96.979 kg). His oral temperature is 97.6 F (36.4 C). His blood pressure is 109/74 and his pulse is 78. His respiration is 12 and oxygen saturation is 100%. .  No palpable axillary supraclavicular or cervical adenopathy. The patient's scars are well  healed. Examination of oral cavity reveals the teeth to be in good repair. The patient continues to have some trismus. Examination of the oral tongue and floor of mouth with visual inspection and palpation reveals no suspicious areas. Palpation along the base of tongue area reveals no suspicious induration.   Indirect mirror examination: The patient proceeded to undergo an indirect mirror examination.  The good view of the base of tongue was noted without any mucosal lesions. No lesions noted along the epiglottis. I was unable to visualize all of the vocal cords on exam today.  Lab Findings: Lab Results  Component Value Date   WBC 5.7 03/16/2014   HGB 14.5 03/16/2014   HCT 41.5 03/16/2014   MCV 86.8 03/16/2014   PLT 218 03/16/2014    Radiographic Findings:  Ct Head Wo Contrast  01/25/2014   CLINICAL DATA:  Followup intracranial hemorrhage.  EXAM: CT HEAD WITHOUT CONTRAST  TECHNIQUE: Contiguous axial images were obtained from the base of the skull through the vertex without intravenous contrast.  COMPARISON:  Prior CT from 01/23/2014  FINDINGS: Acute nondisplaced calvarial fracture again noted, stable.  Overlying scalp hematoma again noted, similar to prior.  Small right parafalcine subdural hematoma not significantly changed in size measuring up to 3 mm and diameter. No associated mass effect.  There has been continued interval evolution of bifrontal hemorrhagic contusions. Overall amount of hyperdense blood products is similar to prior with increased associated vasogenic edema. No significant associated mass  effect.  Tiny subdural hematoma overlying the right frontal lobe again seen measuring up to 2 mm. No significant mass effect.  Previously seen subarachnoid hemorrhage no longer definitely seen. No intraventricular hemorrhage. No hydrocephalus. Basilar cisterns remain patent. No acute large vessel territory infarct.  No acute abnormality seen about the orbits.  Fluid and mucosal thickening  within the paranasal sinuses has not significantly changed relative to prior study.  Mastoid air cells remain clear.  Middle ear cavities clear.  IMPRESSION: 1. Continued expected interval evolution of inferior bifrontal and anterior left temporal hemorrhagic contusions. No significant mass effect. 2. Persistent small anterior right frontal convexity subdural hematoma with small volume right parafalcine subdural hemorrhage. Subarachnoid hemorrhage has resolved. 3. No new intracranial abnormality. 4. Stable nondisplaced nondepressed frontal calvarial fracture. 5. Persistent mucosal thickening with fluid within the paranasal sinuses, likely inflammatory in nature.   Electronically Signed   By: Jeannine Boga M.D.   On: 01/25/2014 06:26   Ct Head Wo Contrast  01/23/2014   CLINICAL DATA:  34 year old male with acute intracranial hemorrhage, skull fracture, undetermined trauma mechanism. Initial encounter.  EXAM: CT HEAD WITHOUT CONTRAST  TECHNIQUE: Contiguous axial images were obtained from the base of the skull through the vertex without intravenous contrast.  COMPARISON:  Head CT 01/22/2014  FINDINGS: Nondisplaced anterior frontal bone skull fracture tracking from the confluence of sutures at the vertex caudally just to the left of midline, and crossing midline inferiorly into the right frontal bone. No definite involvement of the frontal sinuses which are both hypoplastic. Broad-based scalp hematoma at the vertex has significantly progressed, and is up to 11 mm in thickness circumferentially.  No other skull fracture identified. Mastoids and tympanic cavities are clear. Fluid in mucosal thickening in the paranasal sinuses has not significantly changed and is favored to be inflammatory rather than posttraumatic.  Visualized orbit soft tissues are within normal limits.  Parafalcine subdural hematoma is small in volume and stable. Inferior bifrontal and left anterior temporal lobe hemorrhagic contusions with  expected evolution; increased hyperdense blood products and increased conspicuity of surrounding edema. No significant associated mass effect.  In addition a small right anterior convexity subdural hematoma has regressed (series 2, image 17 versus series 2 L1 image 16 previously).  Interval regression of trace subarachnoid hemorrhage. No intraventricular hemorrhage or ventriculomegaly. Basilar cisterns remain patent and appear normal. No new intracranial hemorrhage or mass effect. No superimposed acute cortically based infarct identified. No suspicious intracranial vascular hyperdensity.  IMPRESSION: 1. Expected evolution of inferior bifrontal and anterior left temporal lobe hemorrhagic contusions. No significant mass effect. 2. Trace subarachnoid hemorrhage has regressed. Small right anterior frontal convexity subdural hemorrhage has regressed. Small volume parafalcine subdural hemorrhage is stable. 3. No new intracranial abnormality identified. 4. Nondisplaced anterior skull fracture tracking from the vertex inferiorly. Progressed broad-based scalp hematoma. 5. Paranasal sinus opacification and fluid favored to be inflammatory rather than posttraumatic, with no facial fracture identified.   Electronically Signed   By: Lars Pinks M.D.   On: 01/23/2014 09:16   Ct Head Wo Contrast  01/22/2014   CLINICAL DATA:  Acute onset of syncope. Loss of consciousness for 30 minutes. Initial encounter.  EXAM: CT HEAD WITHOUT CONTRAST  TECHNIQUE: Contiguous axial images were obtained from the base of the skull through the vertex without intravenous contrast.  COMPARISON:  CT of the neck performed 09/06/2013  FINDINGS: Multiple small contusions and intraparenchymal hemorrhage are noted along the inferior frontal lobes bilaterally, with associated subarachnoid blood. Contusions measure  up to 8 mm in size. These are suspicious for a contrecoup injury, and likely correspond to the calvarial fracture described below.  Additional  subarachnoid blood is seen tracking superiorly along the right frontal lobe. There is also a small amount of subdural blood tracking along the right side of the anterior falx cerebri, measuring 4 mm in thickness. A small amount of subarachnoid blood is seen extending along the left Sylvian fissure, and about the medial and lateral aspects of the left frontoparietal region.  Mild cerebellar atrophy is noted. No significant midline shift is seen. There is mild vasogenic edema within the inferior frontal lobes bilaterally.  There appears to be an incomplete fracture line extending across the upper midline frontal calvarium, extending from the sagittal suture. The visualized portions of the orbits are within normal limits. Fluid is noted filling the right side of the sphenoid sinus and the maxillary sinuses, only slightly increased in attenuation, without evidence of associated fracture. The remaining paranasal sinuses and mastoid air cells are well-aerated. Mild soft tissue swelling is suggested along the vertex.  IMPRESSION: 1. Unusual pattern of multiple contusions and intraparenchymal hemorrhage along the inferior frontal lobes bilaterally, with associated subarachnoid blood. This is suspicious for a contrecoup injury, and likely corresponds to the calvarial fracture described below. Mild associated vasogenic edema seen. 2. Additional subarachnoid blood seen tracking superiorly along the right frontal lobe, along the left Sylvian fissure, and about the medial and lateral aspects of the left frontoparietal region. 3. Small amount of subdural blood tracking along the right side of the anterior falx cerebri, measuring 4 mm in thickness. 4. Incomplete fracture line extending across the upper midline frontal calvarium, extending from the sagittal suture. 5. Mild soft tissue swelling suggested along the vertex. 6. Mild cerebellar atrophy noted. 7. Fluid noted filling the right side of the sphenoid sinus and the maxillary  sinuses, of slightly increased attenuation, without evidence of blood or associated fracture.  Critical Value/emergent results were called by telephone at the time of interpretation on 01/22/2014 at 6:40 am to Dr. Rolland Porter, who verbally acknowledged these results.   Electronically Signed   By: Garald Balding M.D.   On: 01/22/2014 07:09   Ct Cervical Spine Wo Contrast  01/22/2014   CLINICAL DATA:  Fall.  Patient states no trauma.  EXAM: CT CERVICAL SPINE WITHOUT CONTRAST  TECHNIQUE: Multidetector CT imaging of the cervical spine was performed without intravenous contrast. Multiplanar CT image reconstructions were also generated.  COMPARISON:  CT neck 09/06/2013  FINDINGS: Vertebral body alignment, heights and disc space heights are normal. There is minimal spondylosis present. Prevertebral soft tissues as well as a atlantoaxial articulation are normal. There is no fracture or subluxation. There are a few surgical clips over the soft tissues to the right at the C1-2 level. Remainder of the exam is unremarkable.  IMPRESSION: No acute findings.  Mild spondylosis of the cervical spine.   Electronically Signed   By: Marin Olp M.D.   On: 01/22/2014 08:00   Impression: Bee Hammerschmidt is a 34 year old male presenting to clinic in regards to his recurrent oral tongue cancer. No evidence of recurrence on clinical exam today.  Plan:  Renteria has been advised of his routine follow-up appointment with radiation oncology in three months . In July or August of 2016 the patient is scheduled to see Dr. Constance Holster. The patient is also aware of future appointment with Dr. Alvy Bimler in regards to blood work and CT scan. Mccaughey was advised  to continue proper trismus tongue exercises.   This document serves as a record of services personally performed by Gery Pray, MD. It was created on his behalf by Lenn Cal, a trained medical scribe. The creation of this record is based on the scribe's personal observations and the  provider's statements to them. This document has been checked and approved by the attending provider.    ____________________________________   Blair Promise, PhD, MD

## 2014-07-13 NOTE — Progress Notes (Signed)
Pain Status: He is currently in no pain. BP 122/76 mmHg  Pulse 69  Temp(Src) 97.6 F (36.4 C) (Oral)  Resp 12  Wt 213 lb 12.8 oz (96.979 kg)  SpO2 100% Orthostatic Standing Vital Signs: BP:109/74 P:78 Pox:100  Nutritional Status a) intake: PO Diet: Regular  b) using a feeding tube?: no c) weight changes, if any:  Wt Readings from Last 3 Encounters:  07/13/14 213 lb 12.8 oz (96.979 kg)  03/16/14 222 lb 12.8 oz (101.061 kg)  02/03/14 204 lb 9.6 oz (92.806 kg)    Swallowing Status: Pt denies dysphagia.  Smoking or chewing tobacco? no  Dental (if applicable): When was last visit with dentistry Nov 2015, appt for July 2016  Using fluoride trays daily? no   When was last ENT visit?  When is next ENT visit? Unsure  Summary of last medical oncology visit (if applicable) is Feb 3295.   Next med/onc visit is on July 2016.  Imaging done in the last month (if applicable) revealed: Due for CT August 15 2014  Other notable issues, if any: [place] in December of 2015, fractured his skull.  Was hospitalized 5 days, unsure of diagnosis or what caused event.  He reports he "blacked out." Continued dry mouth, chews gum constantly.

## 2014-08-15 ENCOUNTER — Ambulatory Visit (HOSPITAL_COMMUNITY)
Admission: RE | Admit: 2014-08-15 | Discharge: 2014-08-15 | Disposition: A | Discharge status: Home / Self Care | Payer: Commercial Managed Care - HMO | Source: Ambulatory Visit | Attending: Hematology and Oncology | Admitting: Hematology and Oncology

## 2014-08-15 ENCOUNTER — Other Ambulatory Visit (HOSPITAL_BASED_OUTPATIENT_CLINIC_OR_DEPARTMENT_OTHER): Payer: Commercial Managed Care - HMO

## 2014-08-15 ENCOUNTER — Encounter (HOSPITAL_COMMUNITY): Payer: Self-pay

## 2014-08-15 ENCOUNTER — Other Ambulatory Visit: Payer: Self-pay

## 2014-08-15 DIAGNOSIS — C029 Malignant neoplasm of tongue, unspecified: Secondary | ICD-10-CM

## 2014-08-15 DIAGNOSIS — Z85810 Personal history of malignant neoplasm of tongue: Secondary | ICD-10-CM | POA: Insufficient documentation

## 2014-08-15 DIAGNOSIS — Z9221 Personal history of antineoplastic chemotherapy: Secondary | ICD-10-CM | POA: Diagnosis not present

## 2014-08-15 DIAGNOSIS — E039 Hypothyroidism, unspecified: Secondary | ICD-10-CM | POA: Diagnosis not present

## 2014-08-15 DIAGNOSIS — E038 Other specified hypothyroidism: Secondary | ICD-10-CM

## 2014-08-15 LAB — COMPREHENSIVE METABOLIC PANEL (CC13)
ALT: 24 U/L (ref 0–55)
AST: 19 U/L (ref 5–34)
Albumin: 3.9 g/dL (ref 3.5–5.0)
Alkaline Phosphatase: 77 U/L (ref 40–150)
Anion Gap: 8 mEq/L (ref 3–11)
BUN: 12.3 mg/dL (ref 7.0–26.0)
CALCIUM: 9.4 mg/dL (ref 8.4–10.4)
CO2: 25 mEq/L (ref 22–29)
Chloride: 107 mEq/L (ref 98–109)
Creatinine: 0.9 mg/dL (ref 0.7–1.3)
EGFR: >90 mL/min/{1.73_m2} (ref >90)
Glucose: 80 mg/dl (ref 70–140)
Potassium: 4 mEq/L (ref 3.5–5.1)
Sodium: 140 mEq/L (ref 136–145)
Total Bilirubin: 0.62 mg/dL (ref 0.20–1.20)
Total Protein: 6.7 g/dL (ref 6.4–8.3)

## 2014-08-15 LAB — TSH CHCC: TSH: 1.78 m(IU)/L (ref 0.320–4.118)

## 2014-08-15 LAB — CBC WITH DIFFERENTIAL/PLATELET
BASO%: 1.7 % (ref 0.0–2.0)
BASOS ABS: 0.1 10*3/uL (ref 0.0–0.1)
EOS%: 6.6 % (ref 0.0–7.0)
Eosinophils Absolute: 0.3 10*3/uL (ref 0.0–0.5)
HCT: 42.5 % (ref 38.4–49.9)
HEMOGLOBIN: 14.5 g/dL (ref 13.0–17.1)
LYMPH#: 0.4 10*3/uL — AB (ref 0.9–3.3)
LYMPH%: 9.7 % — AB (ref 14.0–49.0)
MCH: 29.6 pg (ref 27.2–33.4)
MCHC: 34.1 g/dL (ref 32.0–36.0)
MCV: 86.7 fL (ref 79.3–98.0)
MONO#: 0.6 10*3/uL (ref 0.1–0.9)
MONO%: 13 % (ref 0.0–14.0)
NEUT#: 3.1 10*3/uL (ref 1.5–6.5)
NEUT%: 69 % (ref 39.0–75.0)
Platelets: 219 10*3/uL (ref 140–400)
RBC: 4.9 10*6/uL (ref 4.20–5.82)
RDW: 13 % (ref 11.0–14.6)
WBC: 4.5 10*3/uL (ref 4.0–10.3)

## 2014-08-15 LAB — T4, FREE: FREE T4: 0.81 ng/dL (ref 0.80–1.80)

## 2014-08-15 MED ORDER — IOHEXOL 300 MG/ML  SOLN
80.0000 mL | Freq: Once | INTRAMUSCULAR | Status: AC | PRN
  Administered 2014-08-15: 80 mL via INTRAVENOUS

## 2014-08-16 ENCOUNTER — Telehealth: Payer: Self-pay | Admitting: Hematology and Oncology

## 2014-08-16 ENCOUNTER — Ambulatory Visit (HOSPITAL_BASED_OUTPATIENT_CLINIC_OR_DEPARTMENT_OTHER): Payer: Commercial Managed Care - HMO | Admitting: Hematology and Oncology

## 2014-08-16 ENCOUNTER — Encounter: Payer: Self-pay | Admitting: Hematology and Oncology

## 2014-08-16 VITALS — BP 120/71 | HR 74 | Temp 98.3°F | Resp 20 | Ht 74.0 in | Wt 216.6 lb

## 2014-08-16 DIAGNOSIS — Z85810 Personal history of malignant neoplasm of tongue: Secondary | ICD-10-CM

## 2014-08-16 DIAGNOSIS — E038 Other specified hypothyroidism: Secondary | ICD-10-CM | POA: Diagnosis not present

## 2014-08-16 DIAGNOSIS — C029 Malignant neoplasm of tongue, unspecified: Secondary | ICD-10-CM

## 2014-08-16 NOTE — Progress Notes (Signed)
[place] OFFICE PROGRESS NOTE  Patient Care Team: Redmond School, MD as PCP - General (Internal Medicine) [name], MD as Attending Physician (Otolaryngology) Gery Pray, MD as Attending Physician (Radiation Oncology) Heath Lark, MD as Consulting Physician (Hematology and Oncology)  SUMMARY OF ONCOLOGIC HISTORY: Oncology History   Tongue cancer   Primary site: Lip and Oral Cavity (Right)   Staging method: AJCC 7th Edition   Clinical: Stage III (T1, N1, M0) signed by Heath Lark, MD on 12/15/2012 11:39 AM   Pathologic: Stage III (T1, N1, cM0) signed by Heath Lark, MD on 12/15/2012 11:39 AM   Summary: Stage III (T1, N1, cM0)  Tongue cancer, recurrence   Primary site: Lip and Oral Cavity (Right)   Staging method: AJCC 7th Edition   Clinical: Stage IVA (T1, N2c, M0) signed by Heath Lark, MD on 12/15/2012  1:13 PM   Pathologic: Stage IVA (T1, N2c, cM0) signed by Heath Lark, MD on 12/15/2012  1:14 PM   Summary: Stage IVA (T1, N2c, cM0)       Tongue cancer   08/22/2011 Surgery He had partial glossectomy which showed invasive SCC measured 1.3 cm   09/06/2011 Surgery He underwent right neck dissection which showed 1/24 LN involved and repeat resection of tongue was negative   03/23/2012 Surgery Repeat tngue resection showed well differentiated SCC 1.5 cm which invaded into the muscle   04/17/2012 Imaging PET/CT scan showed New adenopathy in the left neck, compatible with recurrent malignancy   04/23/2012 Surgery he underwent left neck LN dissection and 2/34 LN were positve   06/01/2012 - 07/13/2012 Chemotherapy Patient had 3 cycles of cisplatin   06/12/2012 - 07/14/2012 Radiation Therapy Patient completed RT   12/10/2012 Imaging Interval resolution of previous hypermetabolic cervical adenopathy. There is nonspecific asymmetric increased uptake within the right side of tongue   03/13/2013 Imaging PET/CT scan showed no evidence of disease recurrence. He was noted to have incidental  kidney stones   08/15/2014 Imaging CT scan of the dissection no evidence of disease    INTERVAL HISTORY: Please see below for problem oriented charting. He feels well. Denies new lymphadenopathy. No new lesion in his tongue.  REVIEW OF SYSTEMS:   Constitutional: Denies fevers, chills or abnormal weight loss Eyes: Denies blurriness of vision Ears, nose, mouth, throat, and face: Denies mucositis or sore throat Respiratory: Denies cough, dyspnea or wheezes Cardiovascular: Denies palpitation, chest discomfort or lower extremity swelling Gastrointestinal:  Denies nausea, heartburn or change in bowel habits Skin: Denies abnormal skin rashes Lymphatics: Denies new lymphadenopathy or easy bruising Neurological:Denies numbness, tingling or new weaknesses Behavioral/Psych: Mood is stable, no new changes  All other systems were reviewed with the patient and are negative.  I have reviewed the past medical history, past surgical history, social history and family history with the patient and they are unchanged from previous note.  ALLERGIES:  has No Known Allergies.  MEDICATIONS:  Current Outpatient Prescriptions  Medication Sig Dispense Refill  . levothyroxine (SYNTHROID, LEVOTHROID) 25 MCG tablet TAKE ONE (1) TABLET EACH DAY BEFORE BREAKFAST 30 tablet 6  . simvastatin (ZOCOR) 20 MG tablet Take 20 mg by mouth every evening.      No current facility-administered medications for this visit.    PHYSICAL EXAMINATION: ECOG PERFORMANCE STATUS: 0 - Asymptomatic  Filed Vitals:   08/16/14 0809  BP: 120/71  Pulse: 74  Temp: 98.3 F (36.8 C)  Resp: 20   Filed Weights   08/16/14 0809  Weight: 216 lb  9.6 oz (98.249 kg)    GENERAL:alert, no distress and comfortable SKIN: skin color, texture, turgor are normal, no rashes or significant lesions EYES: normal, Conjunctiva are pink and non-injected, sclera clear OROPHARYNX: No partial tongue resection. No abnormalities NECK: Neck is Woody from  prior radiation and surgery.  LYMPH:  no palpable lymphadenopathy in the cervical, axillary or inguinal LUNGS: clear to auscultation and percussion with normal breathing effort HEART: regular rate & rhythm and no murmurs and no lower extremity edema ABDOMEN:abdomen soft, non-tender and normal bowel sounds Musculoskeletal:no cyanosis of digits and no clubbing  NEURO: alert & oriented x 3 with fluent speech, no focal motor/sensory deficits  LABORATORY DATA:  I have reviewed the data as listed    Component Value Date/Time   NA 140 08/15/2014 1111   NA 136 01/22/2014 0530   K 4.0 08/15/2014 1111   K 3.8 01/22/2014 0530   CL 104 01/22/2014 0530   CL 100 07/10/2012 1341   CO2 25 08/15/2014 1111   CO2 24 01/22/2014 0530   GLUCOSE 80 08/15/2014 1111   GLUCOSE 136* 01/22/2014 0530   GLUCOSE 118* 07/10/2012 1341   BUN 12.3 08/15/2014 1111   BUN 14 01/22/2014 0530   CREATININE 0.9 08/15/2014 1111   CREATININE 1.01 01/22/2014 0530   CALCIUM 9.4 08/15/2014 1111   CALCIUM 9.0 01/22/2014 0530   PROT 6.7 08/15/2014 1111   PROT 6.7 01/22/2014 0530   ALBUMIN 3.9 08/15/2014 1111   ALBUMIN 3.9 01/22/2014 0530   AST 19 08/15/2014 1111   AST 33 01/22/2014 0530   ALT 24 08/15/2014 1111   ALT 21 01/22/2014 0530   ALKPHOS 77 08/15/2014 1111   ALKPHOS 72 01/22/2014 0530   BILITOT 0.62 08/15/2014 1111   BILITOT 0.9 01/22/2014 0530   GFRNONAA >90 01/22/2014 0530   GFRAA >90 01/22/2014 0530    No results found for: SPEP, UPEP  Lab Results  Component Value Date   WBC 4.5 08/15/2014   NEUTROABS 3.1 08/15/2014   HGB 14.5 08/15/2014   HCT 42.5 08/15/2014   MCV 86.7 08/15/2014   PLT 219 08/15/2014      Chemistry      Component Value Date/Time   NA 140 08/15/2014 1111   NA 136 01/22/2014 0530   K 4.0 08/15/2014 1111   K 3.8 01/22/2014 0530   CL 104 01/22/2014 0530   CL 100 07/10/2012 1341   CO2 25 08/15/2014 1111   CO2 24 01/22/2014 0530   BUN 12.3 08/15/2014 1111   BUN 14  01/22/2014 0530   CREATININE 0.9 08/15/2014 1111   CREATININE 1.01 01/22/2014 0530      Component Value Date/Time   CALCIUM 9.4 08/15/2014 1111   CALCIUM 9.0 01/22/2014 0530   ALKPHOS 77 08/15/2014 1111   ALKPHOS 72 01/22/2014 0530   AST 19 08/15/2014 1111   AST 33 01/22/2014 0530   ALT 24 08/15/2014 1111   ALT 21 01/22/2014 0530   BILITOT 0.62 08/15/2014 1111   BILITOT 0.9 01/22/2014 0530       RADIOGRAPHIC STUDIES: I have personally reviewed the radiological images as listed and agreed with the findings in the report. Ct Soft Tissue Neck W Contrast  08/15/2014   CLINICAL DATA:  Tongue cancer status post resection and chemotherapy.  EXAM: CT NECK WITH CONTRAST  TECHNIQUE: Multidetector CT imaging of the neck was performed using the standard protocol following the bolus administration of intravenous contrast.  CONTRAST:  34mL OMNIPAQUE IOHEXOL 300 MG/ML  SOLN  COMPARISON:  09/06/2013 neck CT.  02/22/2014 head CT.  FINDINGS: Pharynx and larynx: No residual/ recurrent tongue mass is identified. Larynx is unremarkable.  Salivary glands: The submandibular glands are small or absent, unchanged. Parotid glands are unchanged in appearance without mass identified.  Thyroid: Unremarkable.  Lymph nodes: Scarring in the neck is likely related to prior radiation therapy and right-sided neck dissection. No enlarged lymph nodes are identified.  Vascular: The major vascular structures of the neck appear patent.  Limited intracranial: Inferior temporal and frontal lobe encephalomalacia described on the prior head CT is incompletely visualized.  Visualized orbits: Unremarkable.  Mastoids and visualized paranasal sinuses: Polypoid mucosal thickening/ mucous retention cysts in the left greater than right maxillary sinuses and right sphenoid sinus. Mild bilateral ethmoid air cell opacification. Mastoid air cells are clear.  Skeleton: No lytic or blastic osseous lesions are identified.  Upper chest: Unchanged  biapical lung scarring, likely related to prior radiation therapy.  IMPRESSION: No evidence of residual/recurrent malignancy in the neck.   Electronically Signed   By: Logan Bores   On: 08/15/2014 13:37     ASSESSMENT & PLAN:  Tongue cancer He has no signs and symptoms of recurrence. I recommend history, physical examination blood work in 12 months   There is no data to support surveillance imaging in the future. I reinforced the importance of surveillance program by the radiation oncologist at [facility].      Hypothyroid He is taking his thyroid supplements consistently. I reinforced the importance of consistent thyroid supplement. Recent thyroid function tests were within normal limits. We will recheck it again in one year's time.     Orders Placed This Encounter  Procedures  . Comprehensive metabolic panel    Standing Status: Future     Number of Occurrences:      Standing Expiration Date: 09/20/2015  . CBC with Differential/Platelet    Standing Status: Future     Number of Occurrences:      Standing Expiration Date: 09/20/2015  . TSH    Standing Status: Future     Number of Occurrences:      Standing Expiration Date: 09/20/2015   All questions were answered. The patient knows to call the clinic with any problems, questions or concerns. No barriers to learning was detected. I spent 15 minutes counseling the patient face to face. The total time spent in the appointment was 20 minutes and more than 50% was on counseling and review of test results     [facility], Temesgen Weightman, MD 08/16/2014 9:22 AM

## 2014-08-16 NOTE — Assessment & Plan Note (Signed)
He has no signs and symptoms of recurrence. I recommend history, physical examination blood work in 12 months   There is no data to support surveillance imaging in the future. I reinforced the importance of surveillance program by the radiation oncologist at [facility].

## 2014-08-16 NOTE — Telephone Encounter (Signed)
Gave and printed appt sched and avs for pt for July 2017 °

## 2014-08-16 NOTE — Assessment & Plan Note (Signed)
He is taking his thyroid supplements consistently. I reinforced the importance of consistent thyroid supplement. Recent thyroid function tests were within normal limits. We will recheck it again in one year's time.

## 2014-09-13 ENCOUNTER — Ambulatory Visit (HOSPITAL_COMMUNITY): Payer: 59

## 2014-09-13 ENCOUNTER — Other Ambulatory Visit: Payer: Self-pay

## 2014-09-15 ENCOUNTER — Ambulatory Visit: Payer: Self-pay | Admitting: Hematology and Oncology

## 2014-09-29 ENCOUNTER — Telehealth: Payer: Self-pay | Admitting: Oncology

## 2014-09-29 NOTE — Telephone Encounter (Signed)
Cancelled appointment

## 2014-09-29 NOTE — Telephone Encounter (Signed)
[name] left a message saying that he needs to cancel his appointment with Dr. Sondra Come on 10/05/14.  He said he has recently lost his job and insurance.  He will call to reschedule.

## 2014-10-05 ENCOUNTER — Ambulatory Visit: Payer: Commercial Managed Care - HMO | Admitting: Radiation Oncology

## 2014-10-05 ENCOUNTER — Ambulatory Visit
Admission: RE | Admit: 2014-10-05 | Payer: Commercial Managed Care - HMO | Source: Ambulatory Visit | Admitting: Radiation Oncology

## 2015-05-10 ENCOUNTER — Telehealth: Payer: Self-pay | Admitting: Hematology and Oncology

## 2015-05-10 NOTE — Telephone Encounter (Signed)
lvm for pt regarding to 7.18 moved to 7.17

## 2015-05-19 ENCOUNTER — Telehealth: Payer: Self-pay | Admitting: *Deleted

## 2015-05-19 NOTE — Telephone Encounter (Signed)
On 05-19-15 fax medical records to [facility] law follow up notes

## 2015-08-14 ENCOUNTER — Ambulatory Visit: Payer: Self-pay | Admitting: Hematology and Oncology

## 2015-08-14 ENCOUNTER — Encounter: Payer: Self-pay | Admitting: Hematology and Oncology

## 2015-08-14 ENCOUNTER — Other Ambulatory Visit: Payer: Self-pay

## 2015-08-15 ENCOUNTER — Other Ambulatory Visit: Payer: Self-pay

## 2015-08-15 ENCOUNTER — Ambulatory Visit: Payer: Self-pay | Admitting: Hematology and Oncology

## 2016-01-24 ENCOUNTER — Other Ambulatory Visit: Payer: Self-pay | Admitting: Nurse Practitioner

## 2016-04-01 ENCOUNTER — Telehealth: Payer: Self-pay | Admitting: Hematology and Oncology

## 2016-04-01 ENCOUNTER — Telehealth: Payer: Self-pay | Admitting: Oncology

## 2016-04-01 ENCOUNTER — Telehealth: Payer: Self-pay | Admitting: *Deleted

## 2016-04-01 NOTE — Telephone Encounter (Signed)
Pt called tosch appt with NG. Pt last saw MD in 2016. Gave next available appt and transferred call to radonc for appt with Dr. Sondra Come

## 2016-04-01 NOTE — Telephone Encounter (Signed)
On 04-01-16 lvm at 2:58 pm

## 2016-04-01 NOTE — Telephone Encounter (Signed)
Patient left a message asking if he needs to make an appointment with Dr. Sondra Come for follow up.  Inbasket sent to Verdie Drown, Medical Secretary to call and schedule appointment.

## 2016-04-22 ENCOUNTER — Ambulatory Visit
Admission: RE | Admit: 2016-04-22 | Discharge: 2016-04-22 | Disposition: A | Discharge status: Home / Self Care | Payer: PRIVATE HEALTH INSURANCE | Source: Ambulatory Visit | Attending: Radiation Oncology | Admitting: Radiation Oncology

## 2016-04-22 ENCOUNTER — Encounter: Payer: Self-pay | Admitting: Radiation Oncology

## 2016-04-22 VITALS — BP 147/90 | HR 74 | Temp 98.2°F | Resp 18 | Wt 242.8 lb

## 2016-04-22 DIAGNOSIS — S0990XS Unspecified injury of head, sequela: Secondary | ICD-10-CM | POA: Insufficient documentation

## 2016-04-22 DIAGNOSIS — R439 Unspecified disturbances of smell and taste: Secondary | ICD-10-CM | POA: Diagnosis not present

## 2016-04-22 DIAGNOSIS — C029 Malignant neoplasm of tongue, unspecified: Secondary | ICD-10-CM

## 2016-04-22 NOTE — Progress Notes (Signed)
Radiation Oncology         [phone]) [phone] ________________________________  Name: [name] MRN: [id number]  Date: 04/22/2016  DOB: 1980-10-13  Follow-Up Visit Note  CC: Randy Herring., MD  [name], MD   Diagnosis:  [name] is a 36 year old male presenting to clinic in regards to his recurrent oral tongue cancer.  Interval Since Last Radiation:  3 years, 9 months  07/14/12: Tongue  Narrative:  The patient returns today for routine follow-up. He states he hasn't been able to attend his previous  follow up appointments because he lost his job for several years and he could not afford insurance. He denies pain or fatigue at this time. He reports a fall in 12/2013 with associated head trauma at work with amnesia with unknown etiology. He state he has since lost his sense of smell. He complains of lost of taste and forces himself to eat. He denies pain with swallowing.  He reports difficulty swallowing bread, but he is able to swallow most other foods. He denies ulcers on his tongue. He reports dry mouth on a daily basis. He denies hemoptysis. 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  Meds: No current outpatient prescriptions on file.   No current facility-administered medications for this encounter.     Physical Findings: The patient is in no acute distress. Patient is alert and oriented X3.  weight is 242 lb 12.8 oz (110.1 kg). His oral temperature is 98.2 F (36.8 C). His blood pressure is 147/90 (abnormal) and his pulse is 74. His respiration is 18 and oxygen saturation is 99%. .  No palpable axillary supraclavicular or cervical adenopathy. The patient's scars are well healed. Examination of oral cavity reveals the teeth to be in good repair. The patient continues to have some trismus. Examination of the oral tongue and floor of mouth with visual inspection and palpation reveals no suspicious  areas. Palpation along the base of tongue area reveals no suspicious induration.   Indirect mirror examination: The patient proceeded to undergo an indirect mirror examination.  The good view of the base of tongue was noted without any mucosal lesions. No lesions noted along the epiglottis. I was unable to visualize all of the vocal cords on exam today.  Lab Findings: Lab Results  Component Value Date   WBC 4.5 08/15/2014   HGB 14.5 08/15/2014   HCT 42.5 08/15/2014   MCV 86.7 08/15/2014   PLT 219 08/15/2014      Impression: [name] is a 36 year old male presenting to clinic in regards to his recurrent oral tongue cancer. No evidence of recurrence on clinical exam today.  Plan:  [place] has been advised of his routine follow-up appointment with radiation oncology in six months. He will see Dr. Alvy Bimler later this spring. The patient has not been on thyroid medication and will need his thyroid function studies assessed. He will also set up an appointment with Dr. Constance Holster. Patient will be followed for approximately another year as he is almost 4 years out from his treatment.  ____________________________________   Blair Promise, PhD, MD  This document serves as a record of services personally performed by Gery Pray, MD. It was created on his behalf by Bethann Humble, a trained medical scribe. The creation of this record is based on the scribe's personal observations and the provider's statements to them. This document has been checked  and approved by the attending provider.

## 2016-04-22 NOTE — Progress Notes (Signed)
[name] here today for follow of oral tongue cancer from 2014.  He states that he hasn't been able to attend his follow up because he lost his job for several years but recently has gained two jobs.  Works part-time at two locations.  Patient denies any pain. Patient denies any fatigue.  Patient reports that in 12/2013 he fell and had head trauma at work, has amnesia from it and has unknown etiology.  Patient states that since that head trauma he has lost his smell.  He  Complains of lost of taste.  He forces himself to eat.  Denies any issues with swallowing.  He states he does not think he has any ulcers on his tongue. He reports still having dry mouth on a daily basis.  Vitals:   04/22/16 1600  BP: (!) 147/90  Pulse: 74  Resp: 18  Temp: 98.2 F (36.8 C)  TempSrc: Oral  SpO2: 99%  Weight: 242 lb 12.8 oz (110.1 kg)     Wt Readings from Last 3 Encounters:  04/22/16 242 lb 12.8 oz (110.1 kg)  08/16/14 216 lb 9.6 oz (98.2 kg)  07/13/14 213 lb 12.8 oz (97 kg)

## 2016-04-29 ENCOUNTER — Encounter: Payer: Self-pay | Admitting: Hematology and Oncology

## 2016-04-29 ENCOUNTER — Telehealth: Payer: Self-pay | Admitting: Hematology and Oncology

## 2016-04-29 ENCOUNTER — Ambulatory Visit (HOSPITAL_BASED_OUTPATIENT_CLINIC_OR_DEPARTMENT_OTHER): Payer: PRIVATE HEALTH INSURANCE | Admitting: Hematology and Oncology

## 2016-04-29 ENCOUNTER — Ambulatory Visit (HOSPITAL_BASED_OUTPATIENT_CLINIC_OR_DEPARTMENT_OTHER): Payer: PRIVATE HEALTH INSURANCE

## 2016-04-29 VITALS — BP 137/76 | HR 82 | Temp 98.1°F | Resp 18 | Ht 74.0 in | Wt 242.8 lb

## 2016-04-29 DIAGNOSIS — C029 Malignant neoplasm of tongue, unspecified: Secondary | ICD-10-CM

## 2016-04-29 DIAGNOSIS — E038 Other specified hypothyroidism: Secondary | ICD-10-CM

## 2016-04-29 DIAGNOSIS — E039 Hypothyroidism, unspecified: Secondary | ICD-10-CM

## 2016-04-29 DIAGNOSIS — Z85810 Personal history of malignant neoplasm of tongue: Secondary | ICD-10-CM

## 2016-04-29 LAB — COMPREHENSIVE METABOLIC PANEL
ALT: 26 U/L (ref 0–55)
AST: 28 U/L (ref 5–34)
Albumin: 4.1 g/dL (ref 3.5–5.0)
Alkaline Phosphatase: 71 U/L (ref 40–150)
Anion Gap: 9 mEq/L (ref 3–11)
BUN: 9.7 mg/dL (ref 7.0–26.0)
CALCIUM: 9.3 mg/dL (ref 8.4–10.4)
CO2: 25 meq/L (ref 22–29)
Chloride: 108 mEq/L (ref 98–109)
Creatinine: 1 mg/dL (ref 0.7–1.3)
EGFR: >90 mL/min/{1.73_m2} (ref >90)
Glucose: 91 mg/dl (ref 70–140)
Potassium: 4.1 mEq/L (ref 3.5–5.1)
Sodium: 141 mEq/L (ref 136–145)
Total Bilirubin: 0.43 mg/dL (ref 0.20–1.20)
Total Protein: 7 g/dL (ref 6.4–8.3)

## 2016-04-29 LAB — CBC WITH DIFFERENTIAL/PLATELET
BASO%: 1 % (ref 0.0–2.0)
BASOS ABS: 0.1 10*3/uL (ref 0.0–0.1)
EOS%: 3.8 % (ref 0.0–7.0)
Eosinophils Absolute: 0.4 10*3/uL (ref 0.0–0.5)
HCT: 42.3 % (ref 38.4–49.9)
HGB: 14.4 g/dL (ref 13.0–17.1)
LYMPH%: 3.3 % — AB (ref 14.0–49.0)
MCH: 29.3 pg (ref 27.2–33.4)
MCHC: 34.1 g/dL (ref 32.0–36.0)
MCV: 85.8 fL (ref 79.3–98.0)
MONO#: 0.6 10*3/uL (ref 0.1–0.9)
MONO%: 6 % (ref 0.0–14.0)
NEUT#: 8.2 10*3/uL — ABNORMAL HIGH (ref 1.5–6.5)
NEUT%: 85.9 % — ABNORMAL HIGH (ref 39.0–75.0)
Platelets: 233 10*3/uL (ref 140–400)
RBC: 4.93 10*6/uL (ref 4.20–5.82)
RDW: 13.8 % (ref 11.0–14.6)
WBC: 9.5 10*3/uL (ref 4.0–10.3)
lymph#: 0.3 10*3/uL — ABNORMAL LOW (ref 0.9–3.3)

## 2016-04-29 LAB — TSH: TSH: 1.924 m[IU]/L (ref 0.320–4.118)

## 2016-04-29 NOTE — Assessment & Plan Note (Signed)
He has no signs and symptoms of recurrence. I recommend history, physical examination blood work in 12 months   There is no data to support surveillance imaging in the future. I reinforced the importance of surveillance program by the ENT surgeon.

## 2016-04-29 NOTE — Progress Notes (Signed)
[place] OFFICE PROGRESS NOTE  Patient Care Team: Redmond School, MD as PCP - General (Internal Medicine) [name], MD as Attending Physician (Otolaryngology) Gery Pray, MD as Attending Physician (Radiation Oncology) Heath Lark, MD as Consulting Physician (Hematology and Oncology)  SUMMARY OF ONCOLOGIC HISTORY: Oncology History   Tongue cancer   Primary site: Lip and Oral Cavity (Right)   Staging method: AJCC 7th Edition   Clinical: Stage III (T1, N1, M0) signed by Heath Lark, MD on 12/15/2012 11:39 AM   Pathologic: Stage III (T1, N1, cM0) signed by Heath Lark, MD on 12/15/2012 11:39 AM   Summary: Stage III (T1, N1, cM0)  Tongue cancer, recurrence   Primary site: Lip and Oral Cavity (Right)   Staging method: AJCC 7th Edition   Clinical: Stage IVA (T1, N2c, M0) signed by Heath Lark, MD on 12/15/2012  1:13 PM   Pathologic: Stage IVA (T1, N2c, cM0) signed by Heath Lark, MD on 12/15/2012  1:14 PM   Summary: Stage IVA (T1, N2c, cM0)       Tongue cancer ([place])   08/22/2011 Surgery    He had partial glossectomy which showed invasive SCC measured 1.3 cm      09/06/2011 Surgery    He underwent right neck dissection which showed 1/24 LN involved and repeat resection of tongue was negative      03/23/2012 Surgery    Repeat tngue resection showed well differentiated SCC 1.5 cm which invaded into the muscle      04/17/2012 Imaging    PET/CT scan showed New adenopathy in the left neck, compatible with recurrent malignancy      04/23/2012 Surgery    he underwent left neck LN dissection and 2/34 LN were positve      06/01/2012 - 07/13/2012 Chemotherapy    Patient had 3 cycles of cisplatin      06/12/2012 - 07/14/2012 Radiation Therapy    Patient completed RT      12/10/2012 Imaging    Interval resolution of previous hypermetabolic cervical adenopathy. There is nonspecific asymmetric increased uptake within the right side of tongue      03/13/2013 Imaging   PET/CT scan showed no evidence of disease recurrence. He was noted to have incidental kidney stones      08/15/2014 Imaging    CT scan of the dissection no evidence of disease       INTERVAL HISTORY: Please see below for problem oriented charting. He returns for further follow-up He has lost his insurance and has not been seen for a while He has stop all his prescription medications when he lost his insurance He have chronic dry mouth Denies recent dental issue No dysphagia No lymphadenopathy He denies smoking or drinking  REVIEW OF SYSTEMS:   Constitutional: Denies fevers, chills or abnormal weight loss Eyes: Denies blurriness of vision Ears, nose, mouth, throat, and face: Denies mucositis or sore throat Respiratory: Denies cough, dyspnea or wheezes Cardiovascular: Denies palpitation, chest discomfort or lower extremity swelling Gastrointestinal:  Denies nausea, heartburn or change in bowel habits Skin: Denies abnormal skin rashes Lymphatics: Denies new lymphadenopathy or easy bruising Neurological:Denies numbness, tingling or new weaknesses Behavioral/Psych: Mood is stable, no new changes  All other systems were reviewed with the patient and are negative.  I have reviewed the past medical history, past surgical history, social history and family history with the patient and they are unchanged from previous note.  ALLERGIES:  has No Known Allergies.  MEDICATIONS:  No current outpatient  prescriptions on file.   No current facility-administered medications for this visit.     PHYSICAL EXAMINATION: ECOG PERFORMANCE STATUS: 0 - Asymptomatic  Vitals:   04/29/16 1315  BP: 137/76  Pulse: 82  Resp: 18  Temp: 98.1 F (36.7 C)   Filed Weights   04/29/16 1315  Weight: 242 lb 12.8 oz (110.1 kg)    GENERAL:alert, no distress and comfortable SKIN: skin color, texture, turgor are normal, no rashes or significant lesions EYES: normal, Conjunctiva are pink and  non-injected, sclera clear OROPHARYNX:no exudate, no erythema and lips, buccal mucosa, and tongue normal  NECK: Well-healed surgical scar.  The skin around his neck is thick and fibrosed from prior radiation LYMPH:  no palpable lymphadenopathy in the cervical, axillary or inguinal LUNGS: clear to auscultation and percussion with normal breathing effort HEART: regular rate & rhythm and no murmurs and no lower extremity edema ABDOMEN:abdomen soft, non-tender and normal bowel sounds Musculoskeletal:no cyanosis of digits and no clubbing  NEURO: alert & oriented x 3 with fluent speech, no focal motor/sensory deficits  LABORATORY DATA:  I have reviewed the data as listed    Component Value Date/Time   NA 140 08/15/2014 1111   K 4.0 08/15/2014 1111   CL 104 01/22/2014 0530   CL 100 07/10/2012 1341   CO2 25 08/15/2014 1111   GLUCOSE 80 08/15/2014 1111   GLUCOSE 118 (H) 07/10/2012 1341   BUN 12.3 08/15/2014 1111   CREATININE 0.9 08/15/2014 1111   CALCIUM 9.4 08/15/2014 1111   PROT 6.7 08/15/2014 1111   ALBUMIN 3.9 08/15/2014 1111   AST 19 08/15/2014 1111   ALT 24 08/15/2014 1111   ALKPHOS 77 08/15/2014 1111   BILITOT 0.62 08/15/2014 1111   GFRNONAA >90 01/22/2014 0530   GFRAA >90 01/22/2014 0530    No results found for: SPEP, UPEP  Lab Results  Component Value Date   WBC 9.5 04/29/2016   NEUTROABS 8.2 (H) 04/29/2016   HGB 14.4 04/29/2016   HCT 42.3 04/29/2016   MCV 85.8 04/29/2016   PLT 233 04/29/2016      Chemistry      Component Value Date/Time   NA 140 08/15/2014 1111   K 4.0 08/15/2014 1111   CL 104 01/22/2014 0530   CL 100 07/10/2012 1341   CO2 25 08/15/2014 1111   BUN 12.3 08/15/2014 1111   CREATININE 0.9 08/15/2014 1111      Component Value Date/Time   CALCIUM 9.4 08/15/2014 1111   ALKPHOS 77 08/15/2014 1111   AST 19 08/15/2014 1111   ALT 24 08/15/2014 1111   BILITOT 0.62 08/15/2014 1111       ASSESSMENT & PLAN:  Tongue cancer He has no signs and  symptoms of recurrence. I recommend history, physical examination blood work in 12 months   There is no data to support surveillance imaging in the future. I reinforced the importance of surveillance program by the ENT surgeon.    Hypothyroid The patient carries a diagnosis of hypothyroidism in the past He has lost his insurance and follow-up for the last 2 years I will recheck his thyroid function tests today and will return if his thyroid function is abnormal   Orders Placed This Encounter  Procedures  . Comprehensive metabolic panel    Standing Status:   Future    Number of Occurrences:   1    Standing Expiration Date:   06/03/2017  . CBC with Differential/Platelet    Standing Status:   Future  Number of Occurrences:   1    Standing Expiration Date:   06/03/2017  . TSH    Standing Status:   Future    Number of Occurrences:   1    Standing Expiration Date:   06/03/2017   All questions were answered. The patient knows to call the clinic with any problems, questions or concerns. No barriers to learning was detected. I spent 15 minutes counseling the patient face to face. The total time spent in the appointment was 20 minutes and more than 50% was on counseling and review of test results     Heath Lark, MD 04/29/2016 2:42 PM

## 2016-04-29 NOTE — Telephone Encounter (Signed)
Gave patient AVS and calender per 04/29/2016 los. Sent patient to lab after.

## 2016-04-29 NOTE — Assessment & Plan Note (Signed)
The patient carries a diagnosis of hypothyroidism in the past He has lost his insurance and follow-up for the last 2 years I will recheck his thyroid function tests today and will return if his thyroid function is abnormal

## 2016-04-30 ENCOUNTER — Telehealth: Payer: Self-pay | Admitting: *Deleted

## 2016-04-30 NOTE — Telephone Encounter (Signed)
-----   Message from Heath Lark, MD sent at 04/30/2016  8:13 AM EDT ----- Regarding: labs are ok pls let him know labs are ok ----- Message ----- From: Interface, Lab In [place] Sent: 04/29/2016   2:15 PM To: Heath Lark, MD

## 2016-04-30 NOTE — Telephone Encounter (Signed)
LM with note below 

## 2016-09-27 DIAGNOSIS — Z85810 Personal history of malignant neoplasm of tongue: Secondary | ICD-10-CM | POA: Insufficient documentation

## 2016-10-17 ENCOUNTER — Ambulatory Visit
Admission: RE | Admit: 2016-10-17 | Discharge: 2016-10-17 | Disposition: A | Discharge status: Home / Self Care | Payer: 59 | Source: Ambulatory Visit | Attending: Radiation Oncology | Admitting: Radiation Oncology

## 2017-04-29 ENCOUNTER — Other Ambulatory Visit: Payer: Self-pay

## 2017-04-29 ENCOUNTER — Ambulatory Visit: Payer: Self-pay | Admitting: Hematology and Oncology

## 2017-05-01 ENCOUNTER — Other Ambulatory Visit: Payer: Self-pay

## 2017-05-01 DIAGNOSIS — C029 Malignant neoplasm of tongue, unspecified: Secondary | ICD-10-CM

## 2017-05-02 ENCOUNTER — Other Ambulatory Visit: Payer: Self-pay

## 2017-05-02 ENCOUNTER — Inpatient Hospital Stay: Payer: Managed Care, Other (non HMO)

## 2017-05-02 ENCOUNTER — Telehealth: Payer: Self-pay | Admitting: Hematology and Oncology

## 2017-05-02 ENCOUNTER — Encounter: Payer: Self-pay | Admitting: Hematology and Oncology

## 2017-05-02 ENCOUNTER — Inpatient Hospital Stay: Payer: Managed Care, Other (non HMO) | Attending: Hematology and Oncology | Admitting: Hematology and Oncology

## 2017-05-02 ENCOUNTER — Telehealth: Payer: Self-pay | Admitting: *Deleted

## 2017-05-02 ENCOUNTER — Other Ambulatory Visit: Payer: Self-pay | Admitting: Hematology and Oncology

## 2017-05-02 DIAGNOSIS — Z923 Personal history of irradiation: Secondary | ICD-10-CM | POA: Diagnosis not present

## 2017-05-02 DIAGNOSIS — Z79899 Other long term (current) drug therapy: Secondary | ICD-10-CM | POA: Insufficient documentation

## 2017-05-02 DIAGNOSIS — R682 Dry mouth, unspecified: Secondary | ICD-10-CM | POA: Diagnosis not present

## 2017-05-02 DIAGNOSIS — E039 Hypothyroidism, unspecified: Secondary | ICD-10-CM | POA: Insufficient documentation

## 2017-05-02 DIAGNOSIS — C029 Malignant neoplasm of tongue, unspecified: Secondary | ICD-10-CM | POA: Insufficient documentation

## 2017-05-02 DIAGNOSIS — K117 Disturbances of salivary secretion: Secondary | ICD-10-CM | POA: Diagnosis not present

## 2017-05-02 DIAGNOSIS — E038 Other specified hypothyroidism: Secondary | ICD-10-CM

## 2017-05-02 DIAGNOSIS — Z9221 Personal history of antineoplastic chemotherapy: Secondary | ICD-10-CM | POA: Diagnosis not present

## 2017-05-02 LAB — TSH: TSH: 2.909 u[IU]/mL (ref 0.320–4.118)

## 2017-05-02 LAB — CMP (CANCER CENTER ONLY)
ALBUMIN: 4.1 g/dL (ref 3.5–5.0)
ALT: 28 U/L (ref 0–55)
AST: 25 U/L (ref 5–34)
Alkaline Phosphatase: 76 U/L (ref 40–150)
Anion gap: 7 (ref 3–11)
BILIRUBIN TOTAL: 0.5 mg/dL (ref 0.2–1.2)
BUN: 11 mg/dL (ref 7–26)
CALCIUM: 9.1 mg/dL (ref 8.4–10.4)
CO2: 24 mmol/L (ref 22–29)
Chloride: 109 mmol/L (ref 98–109)
Creatinine: 0.98 mg/dL (ref 0.70–1.30)
GFR, Est AFR Am: >60 mL/min (ref >60)
GFR, Estimated: >60 mL/min (ref >60)
GLUCOSE: 99 mg/dL (ref 70–140)
Potassium: 4.3 mmol/L (ref 3.5–5.1)
Sodium: 140 mmol/L (ref 136–145)
TOTAL PROTEIN: 7.1 g/dL (ref 6.4–8.3)

## 2017-05-02 LAB — CBC WITH DIFFERENTIAL (CANCER CENTER ONLY)
BASOS ABS: 0.1 10*3/uL (ref 0.0–0.1)
BASOS PCT: 2 %
EOS PCT: 3 %
Eosinophils Absolute: 0.2 10*3/uL (ref 0.0–0.5)
HEMATOCRIT: 44.2 % (ref 38.4–49.9)
Hemoglobin: 14.7 g/dL (ref 13.0–17.1)
Lymphocytes Relative: 7 %
Lymphs Abs: 0.3 10*3/uL — ABNORMAL LOW (ref 0.9–3.3)
MCH: 28 pg (ref 27.2–33.4)
MCHC: 33.4 g/dL (ref 32.0–36.0)
MCV: 84 fL (ref 79.3–98.0)
MONOS PCT: 9 %
Monocytes Absolute: 0.4 10*3/uL (ref 0.1–0.9)
Neutro Abs: 3.9 10*3/uL (ref 1.5–6.5)
Neutrophils Relative %: 79 %
PLATELETS: 259 10*3/uL (ref 140–400)
RBC: 5.26 MIL/uL (ref 4.20–5.82)
RDW: 14 % (ref 11.0–14.6)
WBC Count: 4.9 10*3/uL (ref 4.0–10.3)

## 2017-05-02 NOTE — Assessment & Plan Note (Signed)
He has postsurgical changes on his tongue but it looks a bit red I recommend close ENT follow-up In the meantime, he is advised  not to smoke or drink I plan to see him back again in 6 months

## 2017-05-02 NOTE — Telephone Encounter (Signed)
Notified of message below

## 2017-05-02 NOTE — Telephone Encounter (Signed)
Gave avs and calendar patient requested fridays

## 2017-05-02 NOTE — Telephone Encounter (Signed)
-----   Message from Heath Lark, MD sent at 05/02/2017 11:34 AM EDT ----- Regarding: TSH normal Thyroid test actually came back normal No need to resume thyroid medicine Keep appt for recheck as scheduled ----- Message ----- From: Interface, Lab In [place] Sent: 05/02/2017   9:14 AM To: Heath Lark, MD

## 2017-05-02 NOTE — Assessment & Plan Note (Signed)
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

## 2017-05-02 NOTE — Assessment & Plan Note (Signed)
He has history of hypothyroidism but has stopped taking thyroid supplement for over 6 months Recheck TSH is within normal limits I plan to recheck it again in 3 months and 6 months when I see him back

## 2017-05-02 NOTE — Progress Notes (Signed)
[name] OFFICE PROGRESS NOTE  Patient Care Team: Redmond School, MD as PCP - General (Internal Medicine) [name], MD as Attending Physician (Otolaryngology) Gery Pray, MD as Attending Physician (Radiation Oncology) Heath Lark, MD as Consulting Physician (Hematology and Oncology)  ASSESSMENT & PLAN:  Tongue cancer He has postsurgical changes on his tongue but it looks a bit red I recommend close ENT follow-up In the meantime, he is advised  not to smoke or drink I plan to see him back again in 6 months  Hypothyroid He has history of hypothyroidism but has stopped taking thyroid supplement for over 6 months Recheck TSH is within normal limits I plan to recheck it again in 3 months and 6 months when I see him back  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   INTERVAL HISTORY: Please see below for problem oriented charting. He returns for further follow-up He denies recent smoking or drinking Denies any tongue changes No new lymphadenopathy Denies recent infection  SUMMARY OF ONCOLOGIC HISTORY: Oncology History   Tongue cancer   Primary site: Lip and Oral Cavity (Right)   Staging method: AJCC 7th Edition   Clinical: Stage III (T1, N1, M0) signed by Heath Lark, MD on 12/15/2012 11:39 AM   Pathologic: Stage III (T1, N1, cM0) signed by Heath Lark, MD on 12/15/2012 11:39 AM   Summary: Stage III (T1, N1, cM0)  Tongue cancer, recurrence   Primary site: Lip and Oral Cavity (Right)   Staging method: AJCC 7th Edition   Clinical: Stage IVA (T1, N2c, M0) signed by Heath Lark, MD on 12/15/2012  1:13 PM   Pathologic: Stage IVA (T1, N2c, cM0) signed by Heath Lark, MD on 12/15/2012  1:14 PM   Summary: Stage IVA (T1, N2c, cM0)       Tongue cancer ([place])   08/22/2011 Surgery    He had partial glossectomy which showed  invasive SCC measured 1.3 cm      09/06/2011 Surgery    He underwent right neck dissection which showed 1/24 LN involved and repeat resection of tongue was negative      03/23/2012 Surgery    Repeat tngue resection showed well differentiated SCC 1.5 cm which invaded into the muscle      04/17/2012 Imaging    PET/CT scan showed New adenopathy in the left neck, compatible with recurrent malignancy      04/23/2012 Surgery    he underwent left neck LN dissection and 2/34 LN were positve      06/01/2012 - 07/13/2012 Chemotherapy    Patient had 3 cycles of cisplatin      06/12/2012 - 07/14/2012 Radiation Therapy    Patient completed RT      12/10/2012 Imaging    Interval resolution of previous hypermetabolic cervical adenopathy. There is nonspecific asymmetric increased uptake within the right side of tongue      03/13/2013 Imaging    PET/CT scan showed no evidence of disease recurrence. He was noted to have incidental kidney stones      08/15/2014 Imaging    CT scan of the dissection no evidence of disease       REVIEW OF SYSTEMS:   Constitutional: Denies fevers, chills or abnormal weight loss Eyes: Denies blurriness of vision Ears, nose, mouth, throat, and face: Denies mucositis or sore throat Respiratory: Denies cough, dyspnea or wheezes  Cardiovascular: Denies palpitation, chest discomfort or lower extremity swelling Gastrointestinal:  Denies nausea, heartburn or change in bowel habits Skin: Denies abnormal skin rashes Lymphatics: Denies new lymphadenopathy or easy bruising Neurological:Denies numbness, tingling or new weaknesses Behavioral/Psych: Mood is stable, no new changes  All other systems were reviewed with the patient and are negative.  I have reviewed the past medical history, past surgical history, social history and family history with the patient and they are unchanged from previous note.  ALLERGIES:  has No Known Allergies.  MEDICATIONS:  No current  outpatient medications on file.   No current facility-administered medications for this visit.     PHYSICAL EXAMINATION: ECOG PERFORMANCE STATUS: 0 - Asymptomatic  Vitals:   05/02/17 0944  BP: 123/86  Pulse: 61  Resp: 18  Temp: 97.6 F (36.4 C)  SpO2: 100%   Filed Weights   05/02/17 0944  Weight: 255 lb 14.4 oz (116.1 kg)    GENERAL:alert, no distress and comfortable SKIN: skin color, texture, turgor are normal, no rashes or significant lesions EYES: normal, Conjunctiva are pink and non-injected, sclera clear OROPHARYNX: He has difficulties opening his mouth wide.  Noted postoperative tongue changes.  There is mild redness at the edges of the right border of the tongue NECK: Postop changes with signs of radiation fibrosis LYMPH:  no palpable lymphadenopathy in the cervical, axillary or inguinal LUNGS: clear to auscultation and percussion with normal breathing effort HEART: regular rate & rhythm and no murmurs and no lower extremity edema ABDOMEN:abdomen soft, non-tender and normal bowel sounds Musculoskeletal:no cyanosis of digits and no clubbing  NEURO: alert & oriented x 3 with fluent speech, no focal motor/sensory deficits  LABORATORY DATA:  I have reviewed the data as listed    Component Value Date/Time   NA 140 05/02/2017 0901   NA 141 04/29/2016 1402   K 4.3 05/02/2017 0901   K 4.1 04/29/2016 1402   CL 109 05/02/2017 0901   CL 100 07/10/2012 1341   CO2 24 05/02/2017 0901   CO2 25 04/29/2016 1402   GLUCOSE 99 05/02/2017 0901   GLUCOSE 91 04/29/2016 1402   GLUCOSE 118 (H) 07/10/2012 1341   BUN 11 05/02/2017 0901   BUN 9.7 04/29/2016 1402   CREATININE 0.98 05/02/2017 0901   CREATININE 1.0 04/29/2016 1402   CALCIUM 9.1 05/02/2017 0901   CALCIUM 9.3 04/29/2016 1402   PROT 7.1 05/02/2017 0901   PROT 7.0 04/29/2016 1402   ALBUMIN 4.1 05/02/2017 0901   ALBUMIN 4.1 04/29/2016 1402   AST 25 05/02/2017 0901   AST 28 04/29/2016 1402   ALT 28 05/02/2017 0901    ALT 26 04/29/2016 1402   ALKPHOS 76 05/02/2017 0901   ALKPHOS 71 04/29/2016 1402   BILITOT 0.5 05/02/2017 0901   BILITOT 0.43 04/29/2016 1402   GFRNONAA >60 05/02/2017 0901   GFRAA >60 05/02/2017 0901    No results found for: SPEP, UPEP  Lab Results  Component Value Date   WBC 4.9 05/02/2017   NEUTROABS 3.9 05/02/2017   HGB 14.4 04/29/2016   HCT 44.2 05/02/2017   MCV 84.0 05/02/2017   PLT 259 05/02/2017      Chemistry      Component Value Date/Time   NA 140 05/02/2017 0901   NA 141 04/29/2016 1402   K 4.3 05/02/2017 0901   K 4.1 04/29/2016 1402   CL 109 05/02/2017 0901   CL 100 07/10/2012 1341   CO2 24 05/02/2017 0901   CO2 25 04/29/2016 1402  BUN 11 05/02/2017 0901   BUN 9.7 04/29/2016 1402   CREATININE 0.98 05/02/2017 0901   CREATININE 1.0 04/29/2016 1402      Component Value Date/Time   CALCIUM 9.1 05/02/2017 0901   CALCIUM 9.3 04/29/2016 1402   ALKPHOS 76 05/02/2017 0901   ALKPHOS 71 04/29/2016 1402   AST 25 05/02/2017 0901   AST 28 04/29/2016 1402   ALT 28 05/02/2017 0901   ALT 26 04/29/2016 1402   BILITOT 0.5 05/02/2017 0901   BILITOT 0.43 04/29/2016 1402      All questions were answered. The patient knows to call the clinic with any problems, questions or concerns. No barriers to learning was detected.  I spent 15 minutes counseling the patient face to face. The total time spent in the appointment was 20 minutes and more than 50% was on counseling and review of test results  Heath Lark, MD 05/02/2017 12:58 PM

## 2017-05-09 DIAGNOSIS — Z923 Personal history of irradiation: Secondary | ICD-10-CM | POA: Insufficient documentation

## 2017-08-01 ENCOUNTER — Telehealth: Payer: Self-pay | Admitting: *Deleted

## 2017-08-01 ENCOUNTER — Inpatient Hospital Stay: Payer: 59

## 2017-08-01 ENCOUNTER — Other Ambulatory Visit: Payer: Self-pay | Admitting: Hematology and Oncology

## 2017-08-01 DIAGNOSIS — C029 Malignant neoplasm of tongue, unspecified: Secondary | ICD-10-CM

## 2017-08-01 DIAGNOSIS — E038 Other specified hypothyroidism: Secondary | ICD-10-CM

## 2017-08-01 NOTE — Telephone Encounter (Signed)
-----   Message from Heath Lark, MD sent at 08/01/2017 11:08 AM EDT ----- Regarding: labs He is supposed to have labs repeat today but did not show Does he want to reschedule? If his repeat labs are ok he does not need to see me on 7/25

## 2017-08-01 NOTE — Telephone Encounter (Signed)
LM with note below. Requested return call 

## 2017-08-05 ENCOUNTER — Inpatient Hospital Stay: Payer: 59 | Attending: Hematology and Oncology

## 2017-08-05 DIAGNOSIS — Z79899 Other long term (current) drug therapy: Secondary | ICD-10-CM | POA: Insufficient documentation

## 2017-08-05 DIAGNOSIS — C029 Malignant neoplasm of tongue, unspecified: Secondary | ICD-10-CM | POA: Insufficient documentation

## 2017-08-05 DIAGNOSIS — E039 Hypothyroidism, unspecified: Secondary | ICD-10-CM | POA: Insufficient documentation

## 2017-08-05 DIAGNOSIS — Z923 Personal history of irradiation: Secondary | ICD-10-CM | POA: Insufficient documentation

## 2017-08-21 ENCOUNTER — Encounter: Payer: Self-pay | Admitting: Hematology and Oncology

## 2017-08-21 ENCOUNTER — Inpatient Hospital Stay: Payer: 59

## 2017-08-21 ENCOUNTER — Inpatient Hospital Stay (HOSPITAL_BASED_OUTPATIENT_CLINIC_OR_DEPARTMENT_OTHER): Payer: 59 | Admitting: Hematology and Oncology

## 2017-08-21 DIAGNOSIS — C029 Malignant neoplasm of tongue, unspecified: Secondary | ICD-10-CM

## 2017-08-21 DIAGNOSIS — E039 Hypothyroidism, unspecified: Secondary | ICD-10-CM | POA: Diagnosis not present

## 2017-08-21 DIAGNOSIS — Z79899 Other long term (current) drug therapy: Secondary | ICD-10-CM | POA: Diagnosis not present

## 2017-08-21 DIAGNOSIS — E038 Other specified hypothyroidism: Secondary | ICD-10-CM

## 2017-08-21 DIAGNOSIS — Z923 Personal history of irradiation: Secondary | ICD-10-CM | POA: Diagnosis not present

## 2017-08-21 LAB — CBC WITH DIFFERENTIAL/PLATELET
BASOS ABS: 0.1 10*3/uL (ref 0.0–0.1)
BASOS PCT: 1 %
Eosinophils Absolute: 0.2 10*3/uL (ref 0.0–0.5)
Eosinophils Relative: 2 %
HEMATOCRIT: 40.5 % (ref 38.4–49.9)
HEMOGLOBIN: 13.7 g/dL (ref 13.0–17.1)
Lymphocytes Relative: 5 %
Lymphs Abs: 0.4 10*3/uL — ABNORMAL LOW (ref 0.9–3.3)
MCH: 28.3 pg (ref 27.2–33.4)
MCHC: 33.8 g/dL (ref 32.0–36.0)
MCV: 83.8 fL (ref 79.3–98.0)
MONOS PCT: 9 %
Monocytes Absolute: 0.8 10*3/uL (ref 0.1–0.9)
NEUTROS ABS: 7.2 10*3/uL — AB (ref 1.5–6.5)
NEUTROS PCT: 83 %
Platelets: 295 10*3/uL (ref 140–400)
RBC: 4.83 MIL/uL (ref 4.20–5.82)
RDW: 13.6 % (ref 11.0–14.6)
WBC: 8.7 10*3/uL (ref 4.0–10.3)

## 2017-08-21 LAB — T4, FREE: FREE T4: 0.6 ng/dL — AB (ref 0.82–1.77)

## 2017-08-22 ENCOUNTER — Telehealth: Payer: Self-pay | Admitting: Hematology and Oncology

## 2017-08-22 ENCOUNTER — Telehealth: Payer: Self-pay | Admitting: *Deleted

## 2017-08-22 LAB — TSH: TSH: 1.46 u[IU]/mL (ref 0.320–4.118)

## 2017-08-22 NOTE — Telephone Encounter (Signed)
-----   Message from Heath Lark, MD sent at 08/22/2017  9:45 AM EDT ----- Regarding: free thyroxine level is low Even though his TSH is normal, free thyroxine level is low He is borderline case. I would suggest low dose thyroid medication If he does not want to start now, I suggest primary doctor to follow and recheck in 3 months

## 2017-08-22 NOTE — Telephone Encounter (Signed)
LM with note below. Requested a return call to Dr Calton Dach nurse to discuss what he would like to do.

## 2017-08-22 NOTE — Telephone Encounter (Signed)
No 7/25 los orders or referrals.

## 2017-08-22 NOTE — Assessment & Plan Note (Signed)
The patient has stopped taking thyroid supplement recently Repeat TSH was normal but free thyroxine level was low Given extensive surgery and prior exposure to radiation treatment, I felt that he will likely need thyroid replacement therapy long-term The patient feels well and appears somewhat reluctant to resume thyroid replacement therapy If he does not want to resume now, he will need close follow-up with thyroid function monitoring through his primary care doctor

## 2017-08-22 NOTE — Assessment & Plan Note (Signed)
I recommend close ENT follow-up In the meantime, he is advised  not to smoke or drink He is a long-term cancer survivor I will discharge him from the clinic and recommend close follow-up with ENT and primary doctor

## 2017-08-22 NOTE — Progress Notes (Signed)
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 In the meantime, he is advised  not to smoke or drink He is a long-term cancer survivor I will discharge him from the clinic and recommend close follow-up with ENT and primary doctor  Hypothyroid The patient has stopped taking thyroid supplement recently Repeat TSH was normal but free thyroxine level was low Given extensive surgery and prior exposure to radiation treatment, I felt that he will likely need thyroid replacement therapy long-term The patient feels well and appears somewhat reluctant to resume thyroid replacement therapy If he does not want to resume now, he will need close follow-up with thyroid function monitoring through his primary care doctor    No orders of the defined types were placed in this encounter.   INTERVAL HISTORY: Please see below for problem oriented charting. He returns for further follow-up He denies new tongue lesions No recent infection, fever or chills No new lymphadenopathy He denies recent smoking or alcohol intake He has excellent energy despite discontinuation of thyroid supplement  SUMMARY OF ONCOLOGIC HISTORY: Oncology History   Tongue cancer   Primary site: Lip and Oral Cavity (Right)   Staging method: AJCC 7th Edition   Clinical: Stage III (T1, N1, M0) signed by Heath Lark, MD on 12/15/2012 11:39 AM   Pathologic: Stage III (T1, N1, cM0) signed by Heath Lark, MD on 12/15/2012 11:39 AM   Summary: Stage III (T1, N1, cM0)  Tongue cancer, recurrence   Primary site: Lip and Oral Cavity (Right)   Staging method: AJCC 7th Edition   Clinical: Stage IVA (T1, N2c, M0)  signed by Heath Lark, MD on 12/15/2012  1:13 PM   Pathologic: Stage IVA (T1, N2c, cM0) signed by Heath Lark, MD on 12/15/2012  1:14 PM   Summary: Stage IVA (T1, N2c, cM0)       Tongue cancer ([place])   08/22/2011 Surgery    He had partial glossectomy which showed invasive SCC measured 1.3 cm      09/06/2011 Surgery    He underwent right neck dissection which showed 1/24 LN involved and repeat resection of tongue was negative      03/23/2012 Surgery    Repeat tngue resection showed well differentiated SCC 1.5 cm which invaded into the muscle      04/17/2012 Imaging    PET/CT scan showed New adenopathy in the left neck, compatible with recurrent malignancy      04/23/2012 Surgery    he underwent left neck LN dissection and 2/34 LN were positve      06/01/2012 - 07/13/2012 Chemotherapy    Patient had 3 cycles of cisplatin      06/12/2012 - 07/14/2012 Radiation Therapy    Patient completed RT      12/10/2012 Imaging    Interval resolution of previous hypermetabolic cervical adenopathy. There is nonspecific asymmetric increased uptake within the right side of tongue      03/13/2013 Imaging    PET/CT scan showed no evidence of disease recurrence. He was noted to have incidental kidney stones      08/15/2014 Imaging    CT scan of the dissection no evidence of disease       REVIEW OF SYSTEMS:  Constitutional: Denies fevers, chills or abnormal weight loss Eyes: Denies blurriness of vision Ears, nose, mouth, throat, and face: Denies mucositis or sore throat Respiratory: Denies cough, dyspnea or wheezes Cardiovascular: Denies palpitation, chest discomfort or lower extremity swelling Gastrointestinal:  Denies nausea, heartburn or change in bowel habits Skin: Denies abnormal skin rashes Lymphatics: Denies new lymphadenopathy or easy bruising Neurological:Denies numbness, tingling or new weaknesses Behavioral/Psych: Mood is stable, no new changes  All other systems were reviewed with  the patient and are negative.  I have reviewed the past medical history, past surgical history, social history and family history with the patient and they are unchanged from previous note.  ALLERGIES:  has No Known Allergies.  MEDICATIONS:  No current outpatient medications on file.   No current facility-administered medications for this visit.     PHYSICAL EXAMINATION: ECOG PERFORMANCE STATUS: 0 - Asymptomatic  Vitals:   08/21/17 1508  BP: 112/75  Pulse: 75  Resp: 18  Temp: 97.7 F (36.5 C)  SpO2: 99%   Filed Weights   08/21/17 1508  Weight: 252 lb 11.2 oz (114.6 kg)    GENERAL:alert, no distress and comfortable SKIN: skin color, texture, turgor are normal, no rashes or significant lesions EYES: normal, Conjunctiva are pink and non-injected, sclera clear OROPHARYNX:no exudate, no erythema and lips, buccal mucosa, and tongue normal  NECK: supple, thyroid normal size, non-tender, without nodularity LYMPH:  no palpable lymphadenopathy in the cervical, axillary or inguinal LUNGS: clear to auscultation and percussion with normal breathing effort HEART: regular rate & rhythm and no murmurs and no lower extremity edema ABDOMEN:abdomen soft, non-tender and normal bowel sounds Musculoskeletal:no cyanosis of digits and no clubbing  NEURO: alert & oriented x 3 with fluent speech, no focal motor/sensory deficits  LABORATORY DATA:  I have reviewed the data as listed    Component Value Date/Time   NA 140 05/02/2017 0901   NA 141 04/29/2016 1402   K 4.3 05/02/2017 0901   K 4.1 04/29/2016 1402   CL 109 05/02/2017 0901   CL 100 07/10/2012 1341   CO2 24 05/02/2017 0901   CO2 25 04/29/2016 1402   GLUCOSE 99 05/02/2017 0901   GLUCOSE 91 04/29/2016 1402   GLUCOSE 118 (H) 07/10/2012 1341   BUN 11 05/02/2017 0901   BUN 9.7 04/29/2016 1402   CREATININE 0.98 05/02/2017 0901   CREATININE 1.0 04/29/2016 1402   CALCIUM 9.1 05/02/2017 0901   CALCIUM 9.3 04/29/2016 1402   PROT 7.1  05/02/2017 0901   PROT 7.0 04/29/2016 1402   ALBUMIN 4.1 05/02/2017 0901   ALBUMIN 4.1 04/29/2016 1402   AST 25 05/02/2017 0901   AST 28 04/29/2016 1402   ALT 28 05/02/2017 0901   ALT 26 04/29/2016 1402   ALKPHOS 76 05/02/2017 0901   ALKPHOS 71 04/29/2016 1402   BILITOT 0.5 05/02/2017 0901   BILITOT 0.43 04/29/2016 1402   GFRNONAA >60 05/02/2017 0901   GFRAA >60 05/02/2017 0901    No results found for: SPEP, UPEP  Lab Results  Component Value Date   WBC 8.7 08/21/2017   NEUTROABS 7.2 (H) 08/21/2017   HGB 13.7 08/21/2017   HCT 40.5 08/21/2017   MCV 83.8 08/21/2017   PLT 295 08/21/2017      Chemistry      Component Value Date/Time   NA 140 05/02/2017 0901   NA 141 04/29/2016 1402   K 4.3 05/02/2017 0901   K 4.1 04/29/2016 1402   CL 109 05/02/2017 0901   CL  100 07/10/2012 1341   CO2 24 05/02/2017 0901   CO2 25 04/29/2016 1402   BUN 11 05/02/2017 0901   BUN 9.7 04/29/2016 1402   CREATININE 0.98 05/02/2017 0901   CREATININE 1.0 04/29/2016 1402      Component Value Date/Time   CALCIUM 9.1 05/02/2017 0901   CALCIUM 9.3 04/29/2016 1402   ALKPHOS 76 05/02/2017 0901   ALKPHOS 71 04/29/2016 1402   AST 25 05/02/2017 0901   AST 28 04/29/2016 1402   ALT 28 05/02/2017 0901   ALT 26 04/29/2016 1402   BILITOT 0.5 05/02/2017 0901   BILITOT 0.43 04/29/2016 1402      All questions were answered. The patient knows to call the clinic with any problems, questions or concerns. No barriers to learning was detected.  I spent 10 minutes counseling the patient face to face. The total time spent in the appointment was 15 minutes and more than 50% was on counseling and review of test results  Heath Lark, MD 08/22/2017 9:48 AM

## 2017-08-26 ENCOUNTER — Telehealth: Payer: Self-pay | Admitting: *Deleted

## 2017-08-26 NOTE — Telephone Encounter (Signed)
LM for Taha to call us regarding thyroxine levels and how he would like to proceed.

## 2017-08-26 NOTE — Telephone Encounter (Signed)
-----   Message from Heath Lark, MD sent at 08/22/2017  9:45 AM EDT ----- Regarding: free thyroxine level is low Even though his TSH is normal, free thyroxine level is low He is borderline case. I would suggest low dose thyroid medication If he does not want to start now, I suggest primary doctor to follow and recheck in 3 months

## 2018-11-24 ENCOUNTER — Other Ambulatory Visit: Payer: Self-pay | Admitting: *Deleted

## 2018-11-24 DIAGNOSIS — Z20822 Contact with and (suspected) exposure to covid-19: Secondary | ICD-10-CM

## 2018-11-26 LAB — NOVEL CORONAVIRUS, NAA: SARS-CoV-2, NAA: NOT DETECTED

## 2018-12-29 ENCOUNTER — Other Ambulatory Visit: Payer: Self-pay

## 2018-12-29 DIAGNOSIS — Z20822 Contact with and (suspected) exposure to covid-19: Secondary | ICD-10-CM

## 2018-12-31 LAB — NOVEL CORONAVIRUS, NAA: SARS-CoV-2, NAA: DETECTED — AB

## 2019-05-04 ENCOUNTER — Encounter: Payer: Self-pay | Admitting: Internal Medicine

## 2019-05-13 ENCOUNTER — Other Ambulatory Visit: Payer: Self-pay

## 2019-05-13 ENCOUNTER — Telehealth: Payer: Self-pay

## 2019-05-13 ENCOUNTER — Ambulatory Visit: Payer: BC Managed Care – PPO | Admitting: Nurse Practitioner

## 2019-05-13 ENCOUNTER — Encounter: Payer: Self-pay | Admitting: Nurse Practitioner

## 2019-05-13 DIAGNOSIS — K921 Melena: Secondary | ICD-10-CM | POA: Diagnosis not present

## 2019-05-13 NOTE — Progress Notes (Signed)
Primary Care Physician:  Redmond School, MD Primary Gastroenterologist:  Dr. Gala Romney  Chief Complaint  Patient presents with  . Rectal Bleeding    also in stool, reports a bump will develope near rectum then swells, pop and bleed. Has had this for a few years that developed after chemo for throat/mouth cancer    HPI:   [name] is a 39 y.o. male who presents on referral from primary care for rectal bleeding.  Reviewed information provided with referral including primary care office visit dated 04/28/2019 which is a virtual office visit for rectal bleeding.  He notes this is been ongoing for 2 to 3 weeks, bright red, minimal, intermittent.  No anal pain or itching.  No constipation or straining.  Recommend referral to GI for further evaluation including possible colonoscopy.  Noted history of tongue cancer with lymph node resection and partial tongue removal.  Also underwent chemotherapy for his cancer.  No history of colonoscopy in our system.  Today he states he is doing okay overall.  Rectal bleeding started 2 weeks ago, still ongoing. Last episode was yesterday.  He notes he has a bump near his rectum that will swell, pop, and bleed which has been ongoing for couple years ever since chemotherapy. Bleeding is with every other bowel movement. Denies constipation or straining. Denies known history of hemorrhoids. Blood is in the stool (not floating in the water); described as a moderate amount. The rectal bumps he described he feels is different and typically causes tissue hematochezia and in the water; feels this is a different problem. Denies abdominal pain, N/V, melena, fever, chills, unintentional weight loss. Denies URI or flu-like symptoms. Denies loss of sense of taste or smell. Has not had a COVID-19 vaccine. Not wanting information on the vaccine. Denies chest pain, dyspnea, dizziness, lightheadedness, syncope, near syncope. Denies any other upper or lower GI symptoms.  Past  Medical History:  Diagnosis Date  . Bradycardia 03/16/2014  . Dry mouth    radiation  . GERD (gastroesophageal reflux disease)    occ  . Gout   . Head and neck cancer ([place])    tongue cancer  . High triglycerides   . History of kidney stones   . History of radiation therapy 06/02/12- 07/14/12   oral tongue and bilateral neck, 60 gray in 30 fx  . Hypothyroidism    radiation- not working  . PONV (postoperative nausea and vomiting)    tongue swelled-emergency trach  . Ringing in ears   . Staph skin infection    Elbow- resolved.    . Stones in the urinary tract   . Tongue cancer ([place])   . Xerostomia 11/30/2012    Past Surgical History:  Procedure Laterality Date  . CYSTOSCOPY WITH RETROGRADE PYELOGRAM, URETEROSCOPY AND STENT PLACEMENT Left 04/16/2013   Procedure: CYSTOSCOPY WITH RETROGRADE PYELOGRAM, URETEROSCOPY AND STENT PLACEMENT;  Surgeon: Molli Hazard, MD;  Location: [facility];  Service: Urology;  Laterality: Left;  Marland Kitchen GASTROSTOMY W/ FEEDING TUBE  2/14  . GLOSSECTOMY    . GLOSSECTOMY  08/22/2011   Procedure: GLOSSECTOMY;  Surgeon: [name], MD;  Location: [facility];  Service: ENT;  Laterality: Right;  WITH FROZEN SECTION  . GLOSSECTOMY  09/06/2011   Procedure: GLOSSECTOMY;  Surgeon: [name], MD;  Location: [facility];  Service: ENT;  Laterality: Right;  Right Tongue Re-Excision  . HEMIGLOSSECTOMY N/A 03/23/2012   Procedure: BIOPSY OF TONGUE WITH FROZEN SECTION AND RIGHT HEMIGLOSSECTOMY;  Surgeon: [name],  MD;  Location: [facility];  Service: ENT;  Laterality: N/A;  . HOLMIUM LASER APPLICATION Left [id number]   Procedure: HOLMIUM LASER APPLICATION;  Surgeon: Molli Hazard, MD;  Location: [facility];  Service: Urology;  Laterality: Left;  . MOUTH SURGERY     biopsy- (08/06/2011 Dr. Hoyt Koch), also had tissue graft (oral) 2010  . RADICAL NECK DISSECTION  09/06/2011   Procedure: RADICAL NECK DISSECTION;  Surgeon: [name], MD;  Location: [facility];  Service: ENT;  Laterality: Right;  Right  Modified Neck Dissection   . RADICAL NECK DISSECTION Left 04/23/2012   Procedure: LEFT MODIFIED  NECK DISSECTION;  Surgeon: [name], MD;  Location: [facility];  Service: ENT;  Laterality: Left;  Left Supra Omo Hyoid Neck Dissection   . TONGUE BIOPSY    . TRACHEOSTOMY CLOSURE     14  . TRACHEOSTOMY TUBE PLACEMENT N/A 03/23/2012   Procedure: TRACHEOSTOMY;  Surgeon: [name], MD;  Location: [facility];  Service: ENT;  Laterality: N/A;    Current Outpatient Medications  Medication Sig Dispense Refill  . colchicine 0.6 MG tablet Take 0.6 mg by mouth daily.     No current facility-administered medications for this visit.    Allergies as of 05/13/2019  . (No Known Allergies)    Family History  Problem Relation Age of Onset  . Rheum arthritis Mother   . Asthma Father   . Cancer Maternal Grandmother   . Diabetes Sister   . Diabetes Brother   . Colon cancer Neg Hx     Social History   Socioeconomic History  . Marital status: Married    Spouse name: Not on file  . Number of children: 1  . Years of education: Not on file  . Highest education level: Not on file  Occupational History    Employer: [employer]    Comment: AC unit.   Tobacco Use  . Smoking status: Former Smoker    Years: 15.00    Types: Cigars    Quit date: 08/14/2011    Years since quitting: 7.7  . Smokeless tobacco: Never Used  . Tobacco comment: some day smoker before quitting  Substance and Sexual Activity  . Alcohol use: No    Comment: quit drinking 7/13 from 12 pack of beer on weekends  . Drug use: No  . Sexual activity: Yes  Other Topics Concern  . Not on file  Social History Narrative   Married.   History of smoking 5 small cigars daily but quit in 2013.   History of drinking approximately a 12 pack of beer on weekends but quit in July 2013.         Social Determinants of Health   Financial Resource Strain:   . Difficulty of Paying Living Expenses:   Food Insecurity:   . Worried About  Charity fundraiser in the Last Year:   . Arboriculturist in the Last Year:   Transportation Needs:   . Film/video editor (Medical):   Marland Kitchen Lack of Transportation (Non-Medical):   Physical Activity:   . Days of Exercise per Week:   . Minutes of Exercise per Session:   Stress:   . Feeling of Stress :   Social Connections:   . Frequency of Communication with Friends and Family:   . Frequency of Social Gatherings with Friends and Family:   . Attends Religious Services:   . Active Member of Clubs or Organizations:   . Attends Archivist  Meetings:   Marland Kitchen Marital Status:   Intimate Partner Violence:   . Fear of Current or Ex-Partner:   . Emotionally Abused:   Marland Kitchen Physically Abused:   . Sexually Abused:     Subjective: Review of Systems  Constitutional: Negative for chills, fever, malaise/fatigue and weight loss.  HENT: Negative for congestion and sore throat.   Respiratory: Negative for cough and shortness of breath.   Cardiovascular: Negative for chest pain and palpitations.  Gastrointestinal: Positive for blood in stool. Negative for abdominal pain, diarrhea, melena, nausea and vomiting.  Musculoskeletal: Negative for joint pain and myalgias.  Skin: Negative for rash.  Neurological: Negative for dizziness and weakness.  Endo/Heme/Allergies: Does not bruise/bleed easily.  Psychiatric/Behavioral: Negative for depression. The patient is not nervous/anxious.   All other systems reviewed and are negative.      Objective: BP 137/86   Pulse 88   Temp (!) 97 F (36.1 C) (Oral)   Ht 6\' 2"  (1.88 m)   Wt 262 lb 6.4 oz (119 kg)   BMI 33.69 kg/m  Physical Exam Vitals and nursing note reviewed.  Constitutional:      General: He is not in acute distress.    Appearance: Normal appearance. He is obese. He is not ill-appearing, toxic-appearing or diaphoretic.  HENT:     Head: Normocephalic and atraumatic.     Nose: No congestion or rhinorrhea.  Eyes:     General: No  scleral icterus. Cardiovascular:     Rate and Rhythm: Normal rate and regular rhythm.     Heart sounds: Normal heart sounds.  Pulmonary:     Effort: Pulmonary effort is normal.     Breath sounds: Normal breath sounds.  Abdominal:     General: Bowel sounds are normal. There is no distension.     Palpations: Abdomen is soft. There is no hepatomegaly, splenomegaly or mass.     Tenderness: There is no abdominal tenderness. There is no guarding or rebound.     Hernia: No hernia is present.  Musculoskeletal:     Cervical back: Neck supple.  Skin:    General: Skin is warm and dry.     Coloration: Skin is not jaundiced.     Findings: No bruising or rash.  Neurological:     General: No focal deficit present.     Mental Status: He is alert and oriented to person, place, and time. Mental status is at baseline.  Psychiatric:        Mood and Affect: Mood normal.        Behavior: Behavior normal.        Thought Content: Thought content normal.       05/13/2019 10:48 AM   Disclaimer: This note was dictated with voice recognition software. Similar sounding words can inadvertently be transcribed and may not be corrected upon review.

## 2019-05-13 NOTE — Progress Notes (Signed)
Cc'ed to pcp °

## 2019-05-13 NOTE — Telephone Encounter (Signed)
Pt didn't have insurance card with him today at [facility]. Advised him to bring insurance card by office so we can check to see if PA is needed for TCS. Verbalized understanding.

## 2019-05-13 NOTE — Patient Instructions (Signed)
Your health issues we discussed today were:   Rectal bleeding: 1. We will schedule colonoscopy for you 2. Further recommendations will follow your colonoscopy 3. Have your labs drawn when you are able to 4. Call us if you have any worsening bleeding or signs of significant blood loss such as weakness, fatigue, dizziness, lightheadedness, passing out, nearly passing out  Overall I recommend:  1. Continue your other current medications 2. Return for follow-up in 2 months 3. Call us if you have any questions or concerns   ---------------------------------------------------------------  COVID-19 Vaccine Information can be found at: [email or web address] For questions related to vaccine distribution or appointments, please email [email or web address] or call [phone].   ---------------------------------------------------------------]   At [facility] we value your feedback. You may receive a survey about your visit today. Please share your experience as we strive to create trusting relationships with our patients to provide genuine, compassionate, quality care.  We appreciate your understanding and patience as we review any laboratory studies, imaging, and other diagnostic tests that are ordered as we care for you. Our office policy is 5 business days for review of these results, and any emergent or urgent results are addressed in a timely manner for your best interest. If you do not hear from our office in 1 week, please contact us.   We also encourage the use of MyChart, which contains your medical information for your review as well. If you are not enrolled in this feature, an access code is on this after visit summary for your convenience. Thank you for allowing [place] to be involved in your care.  It was great to see you today!  I hope you have a great Summer!!

## 2019-05-13 NOTE — Assessment & Plan Note (Addendum)
The patient notes hematochezia for the past 2 weeks, still ongoing, occurs about every other bowel movement.  Denies constipation or straining.  No change in caliber appearance of stools.  History of tongue cancer status post chemoradiation and in remission for 6 years.  He does note bumps on his rectum that sometimes swell, burst, bleed but this typically causes intermittent, less common bleeding that is more in the water and on the paper.  His current situation is blood that is adherent on and in the stool.  He showed a picture and this appears to be true.  Denies any other overt GI complaints.  Given his history of cancer, and rectal bleeding we will plan to proceed for colonoscopy.  Given the 2+ weeks of rectal bleeding I will also check a CBC and iron studies.  Proceed with TCS with Dr. Gala Romney in near future: the risks, benefits, and alternatives have been discussed with the patient in detail. The patient states understanding and desires to proceed.  ASA II  The patient is not on any anticoagulants, anxiolytics, chronic pain medications, antidepressants, antidiabetics, or iron supplements.  Conscious sedation should be adequate for his procedure.

## 2019-05-14 LAB — CBC WITH DIFFERENTIAL/PLATELET
Absolute Monocytes: 617 cells/uL (ref 200–950)
Basophils Absolute: 113 cells/uL (ref 0–200)
Basophils Relative: 1.8 %
Eosinophils Absolute: 170 cells/uL (ref 15–500)
Eosinophils Relative: 2.7 %
HCT: 46 % (ref 38.5–50.0)
Hemoglobin: 15.3 g/dL (ref 13.2–17.1)
Lymphs Abs: 340 cells/uL — ABNORMAL LOW (ref 850–3900)
MCH: 27.7 pg (ref 27.0–33.0)
MCHC: 33.3 g/dL (ref 32.0–36.0)
MCV: 83.3 fL (ref 80.0–100.0)
MPV: 11.1 fL (ref 7.5–12.5)
Monocytes Relative: 9.8 %
Neutro Abs: 5059 cells/uL (ref 1500–7800)
Neutrophils Relative %: 80.3 %
Platelets: 298 10*3/uL (ref 140–400)
RBC: 5.52 10*6/uL (ref 4.20–5.80)
RDW: 14.4 % (ref 11.0–15.0)
Total Lymphocyte: 5.4 %
WBC: 6.3 10*3/uL (ref 3.8–10.8)

## 2019-05-14 LAB — IRON: Iron: 36 ug/dL — ABNORMAL LOW (ref 50–180)

## 2019-05-14 LAB — FERRITIN: Ferritin: 12 ng/mL — ABNORMAL LOW (ref 38–380)

## 2019-05-21 ENCOUNTER — Ambulatory Visit: Payer: BC Managed Care – PPO | Admitting: Gastroenterology

## 2019-06-01 NOTE — Telephone Encounter (Signed)
Tried to call pt, no answer, LMOVM to remind him we need new insurance card scanned into chart.

## 2019-06-10 NOTE — Telephone Encounter (Signed)
New insurance card has been scanned into chart. No PA needed for procedure.

## 2019-06-14 ENCOUNTER — Other Ambulatory Visit (HOSPITAL_COMMUNITY)
Admission: RE | Admit: 2019-06-14 | Discharge: 2019-06-14 | Disposition: A | Discharge status: Home / Self Care | Payer: BC Managed Care – PPO | Source: Ambulatory Visit | Attending: Internal Medicine | Admitting: Internal Medicine

## 2019-06-14 ENCOUNTER — Other Ambulatory Visit: Payer: Self-pay

## 2019-06-14 DIAGNOSIS — Z01812 Encounter for preprocedural laboratory examination: Secondary | ICD-10-CM | POA: Diagnosis present

## 2019-06-14 DIAGNOSIS — Z20822 Contact with and (suspected) exposure to covid-19: Secondary | ICD-10-CM | POA: Diagnosis not present

## 2019-06-15 LAB — SARS CORONAVIRUS 2 (TAT 6-24 HRS): SARS Coronavirus 2: NEGATIVE

## 2019-06-16 ENCOUNTER — Ambulatory Visit (HOSPITAL_COMMUNITY)
Admission: RE | Admit: 2019-06-16 | Discharge: 2019-06-16 | Disposition: A | Discharge status: Home / Self Care | Payer: BC Managed Care – PPO | Attending: Internal Medicine | Admitting: Internal Medicine

## 2019-06-16 ENCOUNTER — Other Ambulatory Visit: Payer: Self-pay

## 2019-06-16 ENCOUNTER — Telehealth: Payer: Self-pay | Admitting: *Deleted

## 2019-06-16 ENCOUNTER — Encounter (HOSPITAL_COMMUNITY): Payer: Self-pay | Admitting: Internal Medicine

## 2019-06-16 ENCOUNTER — Encounter (HOSPITAL_COMMUNITY)
Admission: RE | Disposition: A | Discharge status: Home / Self Care | Payer: Self-pay | Source: Home / Self Care | Attending: Internal Medicine

## 2019-06-16 DIAGNOSIS — Z79899 Other long term (current) drug therapy: Secondary | ICD-10-CM | POA: Insufficient documentation

## 2019-06-16 DIAGNOSIS — K921 Melena: Secondary | ICD-10-CM | POA: Diagnosis present

## 2019-06-16 DIAGNOSIS — K640 First degree hemorrhoids: Secondary | ICD-10-CM | POA: Diagnosis not present

## 2019-06-16 DIAGNOSIS — Z923 Personal history of irradiation: Secondary | ICD-10-CM | POA: Insufficient documentation

## 2019-06-16 DIAGNOSIS — Z85810 Personal history of malignant neoplasm of tongue: Secondary | ICD-10-CM | POA: Diagnosis not present

## 2019-06-16 DIAGNOSIS — K6289 Other specified diseases of anus and rectum: Secondary | ICD-10-CM | POA: Diagnosis not present

## 2019-06-16 DIAGNOSIS — Z9049 Acquired absence of other specified parts of digestive tract: Secondary | ICD-10-CM | POA: Insufficient documentation

## 2019-06-16 DIAGNOSIS — Z87891 Personal history of nicotine dependence: Secondary | ICD-10-CM | POA: Insufficient documentation

## 2019-06-16 DIAGNOSIS — M109 Gout, unspecified: Secondary | ICD-10-CM | POA: Diagnosis not present

## 2019-06-16 HISTORY — PX: COLONOSCOPY: SHX5424

## 2019-06-16 SURGERY — COLONOSCOPY
Anesthesia: Moderate Sedation

## 2019-06-16 MED ORDER — MIDAZOLAM HCL 5 MG/5ML IJ SOLN
INTRAMUSCULAR | Status: AC
  Filled 2019-06-16: qty 10

## 2019-06-16 MED ORDER — MIDAZOLAM HCL 5 MG/5ML IJ SOLN
INTRAMUSCULAR | Status: DC | PRN
  Administered 2019-06-16: 1 mg via INTRAVENOUS
  Administered 2019-06-16 (×3): 2 mg via INTRAVENOUS
  Administered 2019-06-16 (×2): 1 mg via INTRAVENOUS

## 2019-06-16 MED ORDER — ONDANSETRON HCL 4 MG/2ML IJ SOLN
INTRAMUSCULAR | Status: AC
  Filled 2019-06-16: qty 2

## 2019-06-16 MED ORDER — ONDANSETRON HCL 4 MG/2ML IJ SOLN
INTRAMUSCULAR | Status: DC | PRN
  Administered 2019-06-16: 4 mg via INTRAVENOUS

## 2019-06-16 MED ORDER — MEPERIDINE HCL 50 MG/ML IJ SOLN
INTRAMUSCULAR | Status: AC
  Filled 2019-06-16: qty 1

## 2019-06-16 MED ORDER — SODIUM CHLORIDE 0.9 % IV SOLN
INTRAVENOUS | Status: DC
  Administered 2019-06-16: 1000 mL via INTRAVENOUS

## 2019-06-16 MED ORDER — STERILE WATER FOR IRRIGATION IR SOLN
Status: DC | PRN
  Administered 2019-06-16: 2.5 mL

## 2019-06-16 MED ORDER — MEPERIDINE HCL 100 MG/ML IJ SOLN
INTRAMUSCULAR | Status: DC | PRN
  Administered 2019-06-16: 25 mg via INTRAVENOUS
  Administered 2019-06-16: 10 mg via INTRAVENOUS
  Administered 2019-06-16: 15 mg via INTRAVENOUS

## 2019-06-16 NOTE — Op Note (Signed)
[facility] Patient Name: [name] Procedure Date: 06/16/2019 11:29 AM MRN: [id number] Date of Birth: [birth year] Attending MD: Norvel Richards , MD CSN: [id number] Age: 39 Admit Type: Outpatient Procedure:                Colonoscopy Indications:              Hematochezia; also complains of a enlarging                            perianal cyst which spontaneously                            bleeds/decompresses. Current issue Providers:                Norvel Richards, MD, Gwynneth Albright RN,                            RN, Aram Candela Referring MD:              Medicines:                Midazolam 9 mg IV, Meperidine 50 mg IV, Ondansetron                            4 mg IV Complications:            No immediate complications. Estimated Blood Loss:     Estimated blood loss: none. Procedure:                Pre-Anesthesia Assessment:                           - Prior to the procedure, a History and Physical                            was performed, and patient medications and                            allergies were reviewed. The patient's tolerance of                            previous anesthesia was also reviewed. The risks                            and benefits of the procedure and the sedation                            options and risks were discussed with the patient.                            All questions were answered, and informed consent                            was obtained. Prior Anticoagulants: The patient has                            taken  no previous anticoagulant or antiplatelet                            agents. ASA Grade Assessment: II - A patient with                            mild systemic disease. After reviewing the risks                            and benefits, the patient was deemed in                            satisfactory condition to undergo the procedure.                           After obtaining informed consent, the colonoscope                             was passed under direct vision. Throughout the                            procedure, the patient's blood pressure, pulse, and                            oxygen saturations were monitored continuously. The                            [id number]) scope was introduced through                            the anus and advanced to the the cecum, identified                            by appendiceal orifice and ileocecal valve. The                            colonoscopy was performed without difficulty. The                            patient tolerated the procedure well. The quality                            of the bowel preparation was adequate. Scope In: 12:39:21 PM Scope Out: 12:53:26 PM Scope Withdrawal Time: 0 hours 7 minutes 28 seconds  Total Procedure Duration: 0 hours 14 minutes 5 seconds  Findings:      The perianal and digital rectal examinations were normal.      Non-bleeding internal hemorrhoids were found during retroflexion. The       hemorrhoids were mild, small and Grade I (internal hemorrhoids that do       not prolapse).      The exam was otherwise without abnormality on direct and retroflexion       views. Impression:               - Non-bleeding internal hemorrhoids.                           -  The examination was otherwise normal on direct                            and retroflexion views.                           - No specimens collected. I suspect benign                            anorectal bleeding secondary to Hemorrhoids.                            Patient also complains of a perianal cyst. He may                            have a pilonidal cyst. Moderate Sedation:      Moderate (conscious) sedation was administered by the endoscopy nurse       and supervised by the endoscopist. The following parameters were       monitored: oxygen saturation, heart rate, blood pressure, respiratory       rate, EKG, adequacy of pulmonary  ventilation, and response to care.       Total physician intraservice time was 25 minutes. Recommendation:           - Patient has a contact number available for                            emergencies. The signs and symptoms of potential                            delayed complications were discussed with the                            patient. Return to normal activities tomorrow.                            Written discharge instructions were provided to the                            patient.                           - Resume previous diet.                           - Continue present medications. Hemorrhoid                            information provided. Course of Anusol cream                            applied to the anorectum twice daily. Hemorrhoid                            banding pamphlet provided as well.                           -  Repeat colonoscopy in 10 years for screening                            purposes.                           - Return to GI office in 6 weeks. Patient is                            interested in definitive treatment for occult                            perianal cyst. Wife has specifically requested                            referral to Dr. Leighton Ruff in [place]. We                            will initiate that referral. Procedure Code(s):        --- Professional ---                           [phone], Colonoscopy, flexible; diagnostic, including                            collection of specimen(s) by brushing or washing,                            when performed (separate procedure)                           99153, Moderate sedation; each additional 15                            minutes intraservice time                           G0500, Moderate sedation services provided by the                            same physician or other qualified health care                            professional performing a gastrointestinal                             endoscopic service that sedation supports,                            requiring the presence of an independent trained                            observer to assist in the monitoring of the  patient's level of consciousness and physiological                            status; initial 15 minutes of intra-service time;                            patient age 95 years or older (additional time may                            be reported with [phone], as appropriate) Diagnosis Code(s):        --- Professional ---                           K64.0, First degree hemorrhoids                           K92.1, Melena (includes Hematochezia) CPT copyright 2019 American Medical Association. All rights reserved. The codes documented in this report are preliminary and upon coder review may  be revised to meet current compliance requirements. Cristopher Estimable. Rohini Jaroszewski, MD Norvel Richards, MD 06/16/2019 1:17:51 PM This report has been signed electronically. Number of Addenda: 0

## 2019-06-16 NOTE — Telephone Encounter (Signed)
Referral sent to [facility] Dr. Marcello Moores

## 2019-06-16 NOTE — Discharge Instructions (Signed)
Colonoscopy Discharge Instructions  Read the instructions outlined below and refer to this sheet in the next few weeks. These discharge instructions provide you with general information on caring for yourself after you leave the hospital. Your doctor may also give you specific instructions. While your treatment has been planned according to the most current medical practices available, unavoidable complications occasionally occur. If you have any problems or questions after discharge, call Dr. Gala Romney at [phone]. ACTIVITY  You may resume your regular activity, but move at a slower pace for the next 24 hours.   Take frequent rest periods for the next 24 hours.   Walking will help get rid of the air and reduce the bloated feeling in your belly (abdomen).   No driving for 24 hours (because of the medicine (anesthesia) used during the test).    Do not sign any important legal documents or operate any machinery for 24 hours (because of the anesthesia used during the test).  NUTRITION  Drink plenty of fluids.   You may resume your normal diet as instructed by your doctor.   Begin with a light meal and progress to your normal diet. Heavy or fried foods are harder to digest and may make you feel sick to your stomach (nauseated).   Avoid alcoholic beverages for 24 hours or as instructed.  MEDICATIONS  You may resume your normal medications unless your doctor tells you otherwise.  WHAT YOU CAN EXPECT TODAY  Some feelings of bloating in the abdomen.   Passage of more gas than usual.   Spotting of blood in your stool or on the toilet paper.  IF YOU HAD POLYPS REMOVED DURING THE COLONOSCOPY:  No aspirin products for 7 days or as instructed.   No alcohol for 7 days or as instructed.   Eat a soft diet for the next 24 hours.  FINDING OUT THE RESULTS OF YOUR TEST Not all test results are available during your visit. If your test results are not back during the visit, make an appointment  with your caregiver to find out the results. Do not assume everything is normal if you have not heard from your caregiver or the medical facility. It is important for you to follow up on all of your test results.  SEEK IMMEDIATE MEDICAL ATTENTION IF:  You have more than a spotting of blood in your stool.   Your belly is swollen (abdominal distention).   You are nauseated or vomiting.   You have a temperature over 101.   You have abdominal pain or discomfort that is severe or gets worse throughout the day.    Hemorrhoid information provided  Hemorrhoid banding pamphlet provided  May benefit from hemorrhoid banding in the office if bleeding persist.  Colon looked really good today otherwise.  I recommend you return at age 5 for average risk colorectal cancer screening with a repeat colonoscopy  Office visit with [place] in 4 weeks (AB)  At patient request I called wife, Ria Comment, [phone] and reviewed results.  At patient's request, will refer patient to Dr. Leighton Ruff in [place] for management of possible pilonidal cyst  Anusol HC cream applied to the anorectum twice daily.   Hemorrhoids Hemorrhoids are swollen veins in and around the rectum or anus. There are two types of hemorrhoids:  Internal hemorrhoids. These occur in the veins that are just inside the rectum. They may poke through to the outside and become irritated and painful.  External hemorrhoids. These occur in the veins that  are outside the anus and can be felt as a painful swelling or hard lump near the anus. Most hemorrhoids do not cause serious problems, and they can be managed with home treatments such as diet and lifestyle changes. If home treatments do not help the symptoms, procedures can be done to shrink or remove the hemorrhoids. What are the causes? This condition is caused by increased pressure in the anal area. This pressure may result from various things, including:  Constipation.  Straining to  have a bowel movement.  Diarrhea.  Pregnancy.  Obesity.  Sitting for long periods of time.  Heavy lifting or other activity that causes you to strain.  Anal sex.  Riding a bike for a long period of time. What are the signs or symptoms? Symptoms of this condition include:  Pain.  Anal itching or irritation.  Rectal bleeding.  Leakage of stool (feces).  Anal swelling.  One or more lumps around the anus. How is this diagnosed? This condition can often be diagnosed through a visual exam. Other exams or tests may also be done, such as:  An exam that involves feeling the rectal area with a gloved hand (digital rectal exam).  An exam of the anal canal that is done using a small tube (anoscope).  A blood test, if you have lost a significant amount of blood.  A test to look inside the colon using a flexible tube with a camera on the end (sigmoidoscopy or colonoscopy). How is this treated? This condition can usually be treated at home. However, various procedures may be done if dietary changes, lifestyle changes, and other home treatments do not help your symptoms. These procedures can help make the hemorrhoids smaller or remove them completely. Some of these procedures involve surgery, and others do not. Common procedures include:  Rubber band ligation. Rubber bands are placed at the base of the hemorrhoids to cut off their blood supply.  Sclerotherapy. Medicine is injected into the hemorrhoids to shrink them.  Infrared coagulation. A type of light energy is used to get rid of the hemorrhoids.  Hemorrhoidectomy surgery. The hemorrhoids are surgically removed, and the veins that supply them are tied off.  Stapled hemorrhoidopexy surgery. The surgeon staples the base of the hemorrhoid to the rectal wall. Follow these instructions at home: Eating and drinking   Eat foods that have a lot of fiber in them, such as whole grains, beans, nuts, fruits, and vegetables.  Ask your  health care provider about taking products that have added fiber (fiber supplements).  Reduce the amount of fat in your diet. You can do this by eating low-fat dairy products, eating less red meat, and avoiding processed foods.  Drink enough fluid to keep your urine pale yellow. Managing pain and swelling   Take warm sitz baths for 20 minutes, 3-4 times a day to ease pain and discomfort. You may do this in a bathtub or using a portable sitz bath that fits over the toilet.  If directed, apply ice to the affected area. Using ice packs between sitz baths may be helpful. ? Put ice in a plastic bag. ? Place a towel between your skin and the bag. ? Leave the ice on for 20 minutes, 2-3 times a day. General instructions  Take over-the-counter and prescription medicines only as told by your health care provider.  Use medicated creams or suppositories as told.  Get regular exercise. Ask your health care provider how much and what kind of exercise is best  for you. In general, you should do moderate exercise for at least 30 minutes on most days of the week (150 minutes each week). This can include activities such as walking, biking, or yoga.  Go to the bathroom when you have the urge to have a bowel movement. Do not wait.  Avoid straining to have bowel movements.  Keep the anal area dry and clean. Use wet toilet paper or moist towelettes after a bowel movement.  Do not sit on the toilet for long periods of time. This increases blood pooling and pain.  Keep all follow-up visits as told by your health care provider. This is important. Contact a health care provider if you have:  Increasing pain and swelling that are not controlled by treatment or medicine.  Difficulty having a bowel movement, or you are unable to have a bowel movement.  Pain or inflammation outside the area of the hemorrhoids. Get help right away if you have:  Uncontrolled bleeding from your rectum. Summary  Hemorrhoids  are swollen veins in and around the rectum or anus.  Most hemorrhoids can be managed with home treatments such as diet and lifestyle changes.  Taking warm sitz baths can help ease pain and discomfort.  In severe cases, procedures or surgery can be done to shrink or remove the hemorrhoids. This information is not intended to replace advice given to you by your health care provider. Make sure you discuss any questions you have with your health care provider. Document Revised: 06/12/2018 Document Reviewed: 06/05/2017 Elsevier Patient Education  [place].

## 2019-06-16 NOTE — H&P (Signed)
@LOGO @   Primary Care Physician:  Redmond School, MD Primary Gastroenterologist:  Dr. Gala Romney  Pre-Procedure History & Physical: HPI:  [name] is a 39 y.o. male here for further evaluation of hematochezia via colonoscopy.  Past Medical History:  Diagnosis Date  . Bradycardia 03/16/2014  . Dry mouth    radiation  . GERD (gastroesophageal reflux disease)    occ  . Gout   . 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 High triglycerides   . History of kidney stones   . History of radiation therapy 06/02/12- 07/14/12   oral tongue and bilateral neck, 60 gray in 30 fx  . Hypothyroidism    radiation- not working  . PONV (postoperative nausea and vomiting)    tongue swelled-emergency trach  . Ringing in ears   . Staph skin infection    Elbow- resolved.    . Stones in the urinary tract   . Tongue cancer ([place])   . Xerostomia 11/30/2012    Past Surgical History:  Procedure Laterality Date  . CYSTOSCOPY WITH RETROGRADE PYELOGRAM, URETEROSCOPY AND STENT PLACEMENT Left 04/16/2013   Procedure: CYSTOSCOPY WITH RETROGRADE PYELOGRAM, URETEROSCOPY AND STENT PLACEMENT;  Surgeon: Molli Hazard, MD;  Location: [facility];  Service: Urology;  Laterality: Left;  Marland Kitchen GASTROSTOMY W/ FEEDING TUBE  2/14  . GLOSSECTOMY    . GLOSSECTOMY  08/22/2011   Procedure: GLOSSECTOMY;  Surgeon: [name], MD;  Location: [place];  Service: ENT;  Laterality: Right;  WITH FROZEN SECTION  . GLOSSECTOMY  09/06/2011   Procedure: GLOSSECTOMY;  Surgeon: [name], MD;  Location: [facility] OR;  Service: ENT;  Laterality: Right;  Right Tongue Re-Excision  . HEMIGLOSSECTOMY N/A 03/23/2012   Procedure: BIOPSY OF TONGUE WITH FROZEN SECTION AND RIGHT HEMIGLOSSECTOMY;  Surgeon: [name], MD;  Location: [place];  Service: ENT;  Laterality: N/A;  . HOLMIUM LASER APPLICATION Left [id number]   Procedure: HOLMIUM LASER APPLICATION;  Surgeon: Molli Hazard, MD;  Location: [facility];  Service: Urology;  Laterality: Left;  . MOUTH  SURGERY     biopsy- (08/06/2011 Dr. Hoyt Koch), also had tissue graft (oral) 2010  . RADICAL NECK DISSECTION  09/06/2011   Procedure: RADICAL NECK DISSECTION;  Surgeon: [name], MD;  Location: [place];  Service: ENT;  Laterality: Right;  Right Modified Neck Dissection   . RADICAL NECK DISSECTION Left 04/23/2012   Procedure: LEFT MODIFIED  NECK DISSECTION;  Surgeon: [name], MD;  Location: [place];  Service: ENT;  Laterality: Left;  Left Supra Omo Hyoid Neck Dissection   . TONGUE BIOPSY    . TRACHEOSTOMY CLOSURE     14  . TRACHEOSTOMY TUBE PLACEMENT N/A 03/23/2012   Procedure: TRACHEOSTOMY;  Surgeon: [name], MD;  Location: [place];  Service: ENT;  Laterality: N/A;    Prior to Admission medications   Medication Sig Start Date End Date Taking? Authorizing Provider  colchicine 0.6 MG tablet Take 0.6 mg by mouth daily. 03/31/19  Yes [provider]    Allergies as of 05/13/2019  . (No Known Allergies)    Family History  Problem Relation Age of Onset  . Rheum arthritis Mother   . Asthma Father   . Cancer Maternal Grandmother   . Diabetes Sister   . Diabetes Brother   . Colon cancer Neg Hx     Social History   Socioeconomic History  . Marital status: Married    Spouse name: Not on file  .  Number of children: 1  . Years of education: Not on file  . Highest education level: Not on file  Occupational History    Employer: [employer]    Comment: AC unit.   Tobacco Use  . Smoking status: Former Smoker    Years: 15.00    Types: Cigars    Quit date: 08/14/2011    Years since quitting: 7.8  . Smokeless tobacco: Never Used  . Tobacco comment: some day smoker before quitting  Substance and Sexual Activity  . Alcohol use: No    Comment: quit drinking 7/13 from 12 pack of beer on weekends  . Drug use: No  . Sexual activity: Yes  Other Topics Concern  . Not on file  Social History Narrative   Married.   History of smoking 5 small cigars daily but quit in 2013.    History of drinking approximately a 12 pack of beer on weekends but quit in July 2013.         Social Determinants of Health   Financial Resource Strain:   . Difficulty of Paying Living Expenses:   Food Insecurity:   . Worried About Charity fundraiser in the Last Year:   . Arboriculturist in the Last Year:   Transportation Needs:   . Film/video editor (Medical):   Marland Kitchen Lack of Transportation (Non-Medical):   Physical Activity:   . Days of Exercise per Week:   . Minutes of Exercise per Session:   Stress:   . Feeling of Stress :   Social Connections:   . Frequency of Communication with Friends and Family:   . Frequency of Social Gatherings with Friends and Family:   . Attends Religious Services:   . Active Member of Clubs or Organizations:   . Attends Archivist Meetings:   Marland Kitchen Marital Status:   Intimate Partner Violence:   . Fear of Current or Ex-Partner:   . Emotionally Abused:   Marland Kitchen Physically Abused:   . Sexually Abused:     Review of Systems: See HPI, otherwise negative ROS  Physical Exam: BP 118/88   Pulse 79   Temp 97.7 F (36.5 C) (Oral)   Resp 19   Ht 6\' 2"  (1.88 m)   Wt 113.4 kg   SpO2 98%   BMI 32.10 kg/m  General:   Alert,  Well-developed, well-nourished, pleasant and cooperative in NAD Neck:  Supple; no masses or thyromegaly. No significant cervical adenopathy. Lungs:  Clear throughout to auscultation.   No wheezes, crackles, or rhonchi. No acute distress. Heart:  Regular rate and rhythm; no murmurs, clicks, rubs,  or gallops. Abdomen: Non-distended, normal bowel sounds.  Soft and nontender without appreciable mass or hepatosplenomegaly.  Pulses:  Normal pulses noted. Extremities:  Without clubbing or edema.  Impression/Plan: 39 year old gentleman here for diagnostic colonoscopy to further evaluate rectal bleeding. The risks, benefits, limitations, alternatives and imponderables have been reviewed with the patient. Questions have been  answered. All parties are agreeable.      Notice: This dictation was prepared with Dragon dictation along with smaller phrase technology. Any transcriptional errors that result from this process are unintentional and may not be corrected upon review.

## 2019-06-16 NOTE — Telephone Encounter (Signed)
-----   Message from Daneil Dolin, MD sent at 06/16/2019  1:18 PM EDT ----- May have a perianal cyst which drains from time to time.  Probably needs to see the general surgeon.  They have requested specific referral to Leighton Ruff.  Lets get that referral moving.  Thanks.

## 2019-06-29 ENCOUNTER — Telehealth: Payer: Self-pay | Admitting: Internal Medicine

## 2019-06-29 NOTE — Telephone Encounter (Signed)
Referral sent to [facility]. Can call them at [phone]  Called pt, line would ring once then fast busy signal

## 2019-06-29 NOTE — Telephone Encounter (Signed)
PATIENT CALLED AND SAID RMR TOLD HIM WE WOULD BE REFERRING HIM TO ANOTHER PHYSICIAN TO LOOK AT A BUMP HE HAS ON HIS BUTTOCK, HE HAS NOT HEARD FROM [place] AND WAS INQUIRING ABOUT STATUS

## 2019-06-29 NOTE — Telephone Encounter (Signed)
Spoke with pt and is aware referral sent to [facility] and provided phone #

## 2019-07-08 ENCOUNTER — Ambulatory Visit: Payer: Self-pay | Admitting: General Surgery

## 2019-07-08 NOTE — H&P (Signed)
  The patient is a 39 year old male who presents with a complaint of anal problems. 39 year old male who presents to the office with intermittent rectal bleeding on the toilet paper. He did undergo a colonoscopy, which showed no other sign for his bleeding. He reports a nodule on his perianal region, which has been present since undergoing chemotherapy for tongue cancer. He states that it intermittently opens up and bleeds and then closes off.    Past Surgical History Antonietta Jewel, [place]; 07/08/2019 2:40 PM) Oral Surgery  Diagnostic Studies History (Chanel Teressa Senter, [place]; 07/08/2019 2:40 PM) Colonoscopy within last year  Allergies (Chanel Teressa Senter, CMA; 07/08/2019 2:40 PM) No Known Drug Allergies [07/08/2019]: Allergies Reconciled  Medication History (Chanel Teressa Senter, CMA; 07/08/2019 2:41 PM) Colchicine (0.6MG  Tablet, Oral) Active. Medications Reconciled  Social History Antonietta Jewel, CMA; 07/08/2019 2:40 PM) Alcohol use Remotely quit alcohol use. Caffeine use Coffee, Tea. No drug use Tobacco use Former smoker.  Family History ([place], [place]; 07/08/2019 2:40 PM) Alcohol Abuse Father. Arthritis Mother. Diabetes Mellitus Brother.  Other Problems (Chanel Teressa Senter, CMA; 07/08/2019 2:40 PM) Cancer     Review of Systems (Chanel Nolan CMA; 07/08/2019 2:40 PM) General Not Present- Appetite Loss, Chills, Fatigue, Fever, Night Sweats, Weight Gain and Weight Loss. HEENT Present- Ringing in the Ears and Seasonal Allergies. Not Present- Earache, Hearing Loss, Hoarseness, Nose Bleed, Oral Ulcers, Sinus Pain, Sore Throat, Visual Disturbances, Wears glasses/contact lenses and Yellow Eyes. Respiratory Not Present- Bloody sputum, Chronic Cough, Difficulty Breathing, Snoring and Wheezing. Breast Not Present- Breast Mass, Breast Pain, Nipple Discharge and Skin Changes. Cardiovascular Not Present- Chest Pain, Difficulty Breathing Lying Down, Leg Cramps, Palpitations, Rapid Heart Rate, Shortness of  Breath and Swelling of Extremities. Gastrointestinal Present- Hemorrhoids. Not Present- Abdominal Pain, Bloating, Bloody Stool, Change in Bowel Habits, Chronic diarrhea, Constipation, Difficulty Swallowing, Excessive gas, Gets full quickly at meals, Indigestion, Nausea, Rectal Pain and Vomiting. Male Genitourinary Not Present- Blood in Urine, Change in Urinary Stream, Frequency, Impotence, Nocturia, Painful Urination, Urgency and Urine Leakage. Musculoskeletal Not Present- Back Pain, Joint Pain, Joint Stiffness, Muscle Pain, Muscle Weakness and Swelling of Extremities. Neurological Not Present- Decreased Memory, Fainting, Headaches, Numbness, Seizures, Tingling, Tremor, Trouble walking and Weakness. Psychiatric Not Present- Anxiety, Bipolar, Change in Sleep Pattern, Depression, Fearful and Frequent crying. Endocrine Not Present- Cold Intolerance, Excessive Hunger, Hair Changes, Heat Intolerance, Hot flashes and New Diabetes. Hematology Not Present- Blood Thinners, Easy Bruising, Excessive bleeding, Gland problems, HIV and Persistent Infections.  Vitals (Chanel Nolan CMA; 07/08/2019 2:41 PM) 07/08/2019 2:41 PM Weight: 262.25 lb Height: 74in Body Surface Area: 2.44 m Body Mass Index: 33.67 kg/m  Temp.: 98.50F  Pulse: 88 (Regular)  BP: 132/86(Sitting, Left Arm, Standard)        Physical Exam Leighton Ruff MD; 0/99/8338 2:57 PM)  General Mental Status-Alert. General Appearance-Cooperative.  Rectal Anorectal Exam External - Note: Left anterior anal nodule with palpable cord, most consistent with anal fistula, not currently draining.    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 There is a palpable cord on anal exam. External opening is in left anterior. We discussed fistulotomy versus seton placement in the indications for both of those procedures. We discussed  typical recurrence rates for both those procedures, and the need for additional surgery with seton placement. We discussed the risk of incontinence with fistulotomy. All questions were answered.

## 2019-07-08 NOTE — H&P (View-Only) (Signed)
°  The patient is a 39 year old male who presents with a complaint of anal problems. 38 year old male who presents to the office with intermittent rectal bleeding on the toilet paper. He did undergo a colonoscopy, which showed no other sign for his bleeding. He reports a nodule on his perianal region, which has been present since undergoing chemotherapy for tongue cancer. He states that it intermittently opens up and bleeds and then closes off.    Past Surgical History Antonietta Jewel, [place]; 07/08/2019 2:40 PM) Oral Surgery  Diagnostic Studies History (Chanel Teressa Senter, [place]; 07/08/2019 2:40 PM) Colonoscopy within last year  Allergies (Chanel Teressa Senter, CMA; 07/08/2019 2:40 PM) No Known Drug Allergies [07/08/2019]: Allergies Reconciled  Medication History (Chanel Teressa Senter, CMA; 07/08/2019 2:41 PM) Colchicine (0.6MG  Tablet, Oral) Active. Medications Reconciled  Social History Antonietta Jewel, CMA; 07/08/2019 2:40 PM) Alcohol use Remotely quit alcohol use. Caffeine use Coffee, Tea. No drug use Tobacco use Former smoker.  Family History ([place], [place]; 07/08/2019 2:40 PM) Alcohol Abuse Father. Arthritis Mother. Diabetes Mellitus Brother.  Other Problems (Chanel Teressa Senter, CMA; 07/08/2019 2:40 PM) Cancer     Review of Systems (Chanel Nolan CMA; 07/08/2019 2:40 PM) General Not Present- Appetite Loss, Chills, Fatigue, Fever, Night Sweats, Weight Gain and Weight Loss. HEENT Present- Ringing in the Ears and Seasonal Allergies. Not Present- Earache, Hearing Loss, Hoarseness, Nose Bleed, Oral Ulcers, Sinus Pain, Sore Throat, Visual Disturbances, Wears glasses/contact lenses and Yellow Eyes. Respiratory Not Present- Bloody sputum, Chronic Cough, Difficulty Breathing, Snoring and Wheezing. Breast Not Present- Breast Mass, Breast Pain, Nipple Discharge and Skin Changes. Cardiovascular Not Present- Chest Pain, Difficulty Breathing Lying Down, Leg Cramps, Palpitations, Rapid Heart Rate, Shortness of  Breath and Swelling of Extremities. Gastrointestinal Present- Hemorrhoids. Not Present- Abdominal Pain, Bloating, Bloody Stool, Change in Bowel Habits, Chronic diarrhea, Constipation, Difficulty Swallowing, Excessive gas, Gets full quickly at meals, Indigestion, Nausea, Rectal Pain and Vomiting. Male Genitourinary Not Present- Blood in Urine, Change in Urinary Stream, Frequency, Impotence, Nocturia, Painful Urination, Urgency and Urine Leakage. Musculoskeletal Not Present- Back Pain, Joint Pain, Joint Stiffness, Muscle Pain, Muscle Weakness and Swelling of Extremities. Neurological Not Present- Decreased Memory, Fainting, Headaches, Numbness, Seizures, Tingling, Tremor, Trouble walking and Weakness. Psychiatric Not Present- Anxiety, Bipolar, Change in Sleep Pattern, Depression, Fearful and Frequent crying. Endocrine Not Present- Cold Intolerance, Excessive Hunger, Hair Changes, Heat Intolerance, Hot flashes and New Diabetes. Hematology Not Present- Blood Thinners, Easy Bruising, Excessive bleeding, Gland problems, HIV and Persistent Infections.  Vitals (Chanel Nolan CMA; 07/08/2019 2:41 PM) 07/08/2019 2:41 PM Weight: 262.25 lb Height: 74in Body Surface Area: 2.44 m Body Mass Index: 33.67 kg/m  Temp.: 98.63F  Pulse: 88 (Regular)  BP: 132/86(Sitting, Left Arm, Standard)        Physical Exam Leighton Ruff MD; 3/53/2992 2:57 PM)  General Mental Status-Alert. General Appearance-Cooperative.  Rectal Anorectal Exam External - Note: Left anterior anal nodule with palpable cord, most consistent with anal fistula, not currently draining.    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 There is a palpable cord on anal exam. External opening is in left anterior. We discussed fistulotomy versus seton placement in the indications for both of those procedures. We discussed  typical recurrence rates for both those procedures, and the need for additional surgery with seton placement. We discussed the risk of incontinence with fistulotomy. All questions were answered.

## 2019-07-20 ENCOUNTER — Encounter: Payer: BC Managed Care – PPO | Admitting: Gastroenterology

## 2019-07-28 ENCOUNTER — Ambulatory Visit: Payer: BC Managed Care – PPO | Admitting: Nurse Practitioner

## 2019-07-28 NOTE — Patient Instructions (Addendum)
DUE TO COVID-19 ONLY ONE VISITOR IS ALLOWED TO COME WITH YOU AND STAY IN THE WAITING ROOM ONLY DURING PRE OP AND PROCEDURE DAY OF SURGERY. THE 1 VISITOR MAY VISIT WITH YOU AFTER SURGERY IN YOUR PRIVATE ROOM DURING VISITING HOURS ONLY!  YOU NEED TO HAVE A COVID 19 TEST ON 08-03-19 @11 :00 AM. THIS TEST MUST BE DONE BEFORE SURGERY, COME  [place], [place] , [address].  ([place]) ONCE YOUR COVID TEST IS COMPLETED, PLEASE BEGIN THE QUARANTINE INSTRUCTIONS AS OUTLINED IN YOUR HANDOUT.                AGASTYA MEISTER  07/28/2019   Your procedure is scheduled on: 08-05-19   Report to [facility] Main  Entrance    Report to admitting at 5:30 AM     Call this number if you have problems the morning of surgery [phone]    Remember: Do not eat food or drink liquids :After Midnight.     Take these medicines the morning of surgery with A SIP OF WATER: Colchicine   BRUSH YOUR TEETH MORNING OF SURGERY AND RINSE YOUR MOUTH OUT, NO CHEWING GUM CANDY OR MINTS.                              You may not have any metal on your body including hair pins and              piercings     Do not wear jewelry, cologone, lotions, powders or deodorant                       Men may shave face and neck.   Do not bring valuables to the hospital. [place].  Contacts, dentures or bridgework may not be worn into surgery.      Patients discharged the day of surgery will not be allowed to drive home. IF YOU ARE HAVING SURGERY AND GOING HOME THE SAME DAY, YOU MUST HAVE AN ADULT TO DRIVE YOU HOME AND BE WITH YOU FOR 24 HOURS. YOU MAY GO HOME BY TAXI OR UBER OR ORTHERWISE, BUT AN ADULT MUST ACCOMPANY YOU HOME AND STAY WITH YOU FOR 24 HOURS.  Name and phone number of your driver:Lindsay Manfred [phone]  Special Instructions: N/A              Please read over the following fact sheets you were  given: _____________________________________________________________________             [facility] - Preparing for Surgery Before surgery, you can play an important role.  Because skin is not sterile, your skin needs to be as free of germs as possible.  You can reduce the number of germs on your skin by washing with CHG (chlorahexidine gluconate) soap before surgery.  CHG is an antiseptic cleaner which kills germs and bonds with the skin to continue killing germs even after washing. Please DO NOT use if you have an allergy to CHG or antibacterial soaps.  If your skin becomes reddened/irritated stop using the CHG and inform your nurse when you arrive at Short Stay. Do not shave (including legs and underarms) for at least 48 hours prior to the first CHG shower.  You may shave your face/neck. Please follow these instructions carefully:  1.  Shower with CHG Soap the night before surgery and the  morning of Surgery.  2.  If you choose to wash your hair, wash your hair first as usual with your  normal  shampoo.  3.  After you shampoo, rinse your hair and body thoroughly to remove the  shampoo.                           4.  Use CHG as you would any other liquid soap.  You can apply chg directly  to the skin and wash                       Gently with a scrungie or clean washcloth.  5.  Apply the CHG Soap to your body ONLY FROM THE NECK DOWN.   Do not use on face/ open                           Wound or open sores. Avoid contact with eyes, ears mouth and genitals (private parts).                       Wash face,  Genitals (private parts) with your normal soap.             6.  Wash thoroughly, paying special attention to the area where your surgery  will be performed.  7.  Thoroughly rinse your body with warm water from the neck down.  8.  DO NOT shower/wash with your normal soap after using and rinsing off  the CHG Soap.                9.  Pat yourself dry with a clean towel.            10.  Wear  clean pajamas.            11.  Place clean sheets on your bed the night of your first shower and do not  sleep with pets. Day of Surgery : Do not apply any lotions/deodorants the morning of surgery.  Please wear clean clothes to the hospital/surgery center.  FAILURE TO FOLLOW THESE INSTRUCTIONS MAY RESULT IN THE CANCELLATION OF YOUR SURGERY PATIENT SIGNATURE_________________________________  NURSE SIGNATURE__________________________________  ________________________________________________________________________

## 2019-07-28 NOTE — Progress Notes (Addendum)
COVID Vaccine Completed: No Date COVID Vaccine completed: COVID vaccine manufacturer: Pfizer    Moderna   [employer]    PCP - Redmond School, MD Cardiologist -   Chest x-ray -  EKG -  Stress Test -  ECHO -  Cardiac Cath -   Sleep Study -  CPAP -   Fasting Blood Sugar -  Checks Blood Sugar _____ times a day  Blood Thinner Instructions: Aspirin Instructions: Last Dose:  Can perform ADL:'s without shortness of breath  Anesthesia review:   Patient denies shortness of breath, fever, cough and chest pain at PAT appointment   Patient verbalized understanding of instructions that were given to them at the PAT appointment. Patient was also instructed that they will need to review over the PAT instructions again at home before surgery.

## 2019-07-29 ENCOUNTER — Other Ambulatory Visit: Payer: Self-pay

## 2019-07-29 ENCOUNTER — Encounter (HOSPITAL_COMMUNITY)
Admission: RE | Admit: 2019-07-29 | Discharge: 2019-07-29 | Disposition: A | Discharge status: Home / Self Care | Payer: BC Managed Care – PPO | Source: Ambulatory Visit | Attending: General Surgery | Admitting: General Surgery

## 2019-07-29 ENCOUNTER — Encounter (HOSPITAL_COMMUNITY): Payer: Self-pay

## 2019-07-29 DIAGNOSIS — Z01812 Encounter for preprocedural laboratory examination: Secondary | ICD-10-CM | POA: Insufficient documentation

## 2019-07-29 LAB — CBC
HCT: 44.8 % (ref 39.0–52.0)
Hemoglobin: 15.1 g/dL (ref 13.0–17.0)
MCH: 28.5 pg (ref 26.0–34.0)
MCHC: 33.7 g/dL (ref 30.0–36.0)
MCV: 84.5 fL (ref 80.0–100.0)
Platelets: 290 10*3/uL (ref 150–400)
RBC: 5.3 MIL/uL (ref 4.22–5.81)
RDW: 13.4 % (ref 11.5–15.5)
WBC: 6.7 10*3/uL (ref 4.0–10.5)
nRBC: 0 % (ref 0.0–0.2)

## 2019-07-29 LAB — SURGICAL PCR SCREEN
MRSA, PCR: NEGATIVE
Staphylococcus aureus: NEGATIVE

## 2019-08-03 ENCOUNTER — Other Ambulatory Visit (HOSPITAL_COMMUNITY)
Admission: RE | Admit: 2019-08-03 | Discharge: 2019-08-03 | Disposition: A | Discharge status: Home / Self Care | Payer: BC Managed Care – PPO | Source: Ambulatory Visit | Attending: General Surgery | Admitting: General Surgery

## 2019-08-03 DIAGNOSIS — Z20822 Contact with and (suspected) exposure to covid-19: Secondary | ICD-10-CM | POA: Diagnosis not present

## 2019-08-03 DIAGNOSIS — Z01812 Encounter for preprocedural laboratory examination: Secondary | ICD-10-CM | POA: Diagnosis present

## 2019-08-03 LAB — SARS CORONAVIRUS 2 (TAT 6-24 HRS): SARS Coronavirus 2: NEGATIVE

## 2019-08-04 ENCOUNTER — Encounter (HOSPITAL_COMMUNITY): Payer: Self-pay | Admitting: General Surgery

## 2019-08-04 MED ORDER — FENTANYL CITRATE (PF) 100 MCG/2ML IJ SOLN
INTRAMUSCULAR | Status: AC
  Filled 2019-08-04: qty 2

## 2019-08-04 NOTE — Anesthesia Preprocedure Evaluation (Addendum)
Anesthesia Evaluation  Patient identified by MRN, date of birth, ID band Patient awake    Reviewed: Allergy & Precautions, NPO status , Patient's Chart, lab work & pertinent test results  History of Anesthesia Complications (+) PONV and history of anesthetic complications  Airway Mallampati: IV   Neck ROM: Limited  Mouth opening: Limited Mouth Opening  Dental  (+) Teeth Intact, Dental Advisory Given, Chipped,    Pulmonary former smoker,    breath sounds clear to auscultation       Cardiovascular negative cardio ROS   Rhythm:Regular Rate:Normal     Neuro/Psych negative neurological ROS  negative psych ROS   GI/Hepatic GERD  ,  Endo/Other  Hypothyroidism   Renal/GU      Musculoskeletal negative musculoskeletal ROS (+)   Abdominal Normal abdominal exam  (+)   Peds  Hematology negative hematology ROS (+)   Anesthesia Other Findings   Reproductive/Obstetrics                           Anesthesia Physical Anesthesia Plan  ASA: II  Anesthesia Plan: General   Post-op Pain Management:    Induction: Intravenous, Cricoid pressure planned and Rapid sequence  PONV Risk Score and Plan: 4 or greater and Propofol infusion, Ondansetron, Midazolam and Dexamethasone  Airway Management Planned: Oral ETT and Video Laryngoscope Planned  Additional Equipment: None  Intra-op Plan:   Post-operative Plan: Extubation in OR  Informed Consent: I have reviewed the patients History and Physical, chart, labs and discussed the procedure including the risks, benefits and alternatives for the proposed anesthesia with the patient or authorized representative who has indicated his/her understanding and acceptance.     Dental advisory given  Plan Discussed with: CRNA  Anesthesia Plan Comments:        Anesthesia Quick Evaluation

## 2019-08-05 ENCOUNTER — Encounter (HOSPITAL_COMMUNITY): Payer: Self-pay | Admitting: General Surgery

## 2019-08-05 ENCOUNTER — Ambulatory Visit (HOSPITAL_COMMUNITY)
Admission: RE | Admit: 2019-08-05 | Discharge: 2019-08-05 | Disposition: A | Discharge status: Home / Self Care | Payer: BC Managed Care – PPO | Attending: General Surgery | Admitting: General Surgery

## 2019-08-05 ENCOUNTER — Encounter (HOSPITAL_COMMUNITY)
Admission: RE | Disposition: A | Discharge status: Home / Self Care | Payer: Self-pay | Source: Home / Self Care | Attending: General Surgery

## 2019-08-05 ENCOUNTER — Ambulatory Visit (HOSPITAL_COMMUNITY): Payer: BC Managed Care – PPO | Admitting: Anesthesiology

## 2019-08-05 DIAGNOSIS — Z9221 Personal history of antineoplastic chemotherapy: Secondary | ICD-10-CM | POA: Insufficient documentation

## 2019-08-05 DIAGNOSIS — Z87891 Personal history of nicotine dependence: Secondary | ICD-10-CM | POA: Diagnosis not present

## 2019-08-05 DIAGNOSIS — K603 Anal fistula: Secondary | ICD-10-CM | POA: Diagnosis present

## 2019-08-05 DIAGNOSIS — Z85810 Personal history of malignant neoplasm of tongue: Secondary | ICD-10-CM | POA: Insufficient documentation

## 2019-08-05 HISTORY — PX: FISTULOTOMY: SHX6413

## 2019-08-05 SURGERY — FISTULOTOMY
Anesthesia: General | Site: Rectum

## 2019-08-05 MED ORDER — BUPIVACAINE-EPINEPHRINE 0.5% -1:200000 IJ SOLN
INTRAMUSCULAR | Status: DC | PRN
  Administered 2019-08-05: 50 mL

## 2019-08-05 MED ORDER — MEPERIDINE HCL 50 MG/ML IJ SOLN: 6.2500 mg | INTRAMUSCULAR | Status: DC | PRN

## 2019-08-05 MED ORDER — 0.9 % SODIUM CHLORIDE (POUR BTL) OPTIME
TOPICAL | Status: DC | PRN
  Administered 2019-08-05: 1000 mL

## 2019-08-05 MED ORDER — ROCURONIUM BROMIDE 10 MG/ML (PF) SYRINGE
PREFILLED_SYRINGE | INTRAVENOUS | Status: AC
  Filled 2019-08-05: qty 10

## 2019-08-05 MED ORDER — BUPIVACAINE-EPINEPHRINE 0.5% -1:200000 IJ SOLN
INTRAMUSCULAR | Status: AC
  Filled 2019-08-05: qty 1

## 2019-08-05 MED ORDER — DEXAMETHASONE SODIUM PHOSPHATE 10 MG/ML IJ SOLN
INTRAMUSCULAR | Status: DC | PRN
  Administered 2019-08-05: 10 mg via INTRAVENOUS

## 2019-08-05 MED ORDER — PROPOFOL 1000 MG/100ML IV EMUL
INTRAVENOUS | Status: AC
  Filled 2019-08-05: qty 100

## 2019-08-05 MED ORDER — PROMETHAZINE HCL 25 MG/ML IJ SOLN: 6.2500 mg | INTRAMUSCULAR | Status: DC | PRN

## 2019-08-05 MED ORDER — FENTANYL CITRATE (PF) 100 MCG/2ML IJ SOLN
INTRAMUSCULAR | Status: AC
  Filled 2019-08-05: qty 2

## 2019-08-05 MED ORDER — PROPOFOL 500 MG/50ML IV EMUL
INTRAVENOUS | Status: DC | PRN
  Administered 2019-08-05: 30 mg via INTRAVENOUS
  Administered 2019-08-05: 170 mg via INTRAVENOUS

## 2019-08-05 MED ORDER — DEXAMETHASONE SODIUM PHOSPHATE 10 MG/ML IJ SOLN
INTRAMUSCULAR | Status: AC
  Filled 2019-08-05: qty 1

## 2019-08-05 MED ORDER — ORAL CARE MOUTH RINSE: 15.0000 mL | Freq: Once | OROMUCOSAL | Status: AC

## 2019-08-05 MED ORDER — FENTANYL CITRATE (PF) 100 MCG/2ML IJ SOLN: 25.0000 ug | INTRAMUSCULAR | Status: DC | PRN

## 2019-08-05 MED ORDER — PROPOFOL 10 MG/ML IV BOLUS
INTRAVENOUS | Status: AC
  Filled 2019-08-05: qty 20

## 2019-08-05 MED ORDER — OXYCODONE HCL 5 MG PO TABS: 5.0000 mg | ORAL_TABLET | ORAL | 0 refills | Status: DC | PRN

## 2019-08-05 MED ORDER — LACTATED RINGERS IV SOLN: INTRAVENOUS | Status: DC

## 2019-08-05 MED ORDER — SUCCINYLCHOLINE CHLORIDE 200 MG/10ML IV SOSY
PREFILLED_SYRINGE | INTRAVENOUS | Status: AC
  Filled 2019-08-05: qty 10

## 2019-08-05 MED ORDER — ACETAMINOPHEN 500 MG PO TABS
1000.0000 mg | ORAL_TABLET | ORAL | Status: AC
  Administered 2019-08-05: 1000 mg via ORAL
  Filled 2019-08-05: qty 2

## 2019-08-05 MED ORDER — MIDAZOLAM HCL 5 MG/5ML IJ SOLN
INTRAMUSCULAR | Status: DC | PRN
  Administered 2019-08-05: 2 mg via INTRAVENOUS

## 2019-08-05 MED ORDER — ACETAMINOPHEN 325 MG PO TABS: 325.0000 mg | ORAL_TABLET | Freq: Once | ORAL | Status: DC | PRN

## 2019-08-05 MED ORDER — LIDOCAINE 2% (20 MG/ML) 5 ML SYRINGE
INTRAMUSCULAR | Status: AC
  Filled 2019-08-05: qty 5

## 2019-08-05 MED ORDER — SUCCINYLCHOLINE CHLORIDE 20 MG/ML IJ SOLN
INTRAMUSCULAR | Status: DC | PRN
Start: 2019-08-05 — End: 2019-08-05
  Administered 2019-08-05: 140 mg via INTRAVENOUS

## 2019-08-05 MED ORDER — CHLORHEXIDINE GLUCONATE 0.12 % MT SOLN
15.0000 mL | Freq: Once | OROMUCOSAL | Status: AC
  Administered 2019-08-05: 15 mL via OROMUCOSAL

## 2019-08-05 MED ORDER — MIDAZOLAM HCL 2 MG/2ML IJ SOLN
INTRAMUSCULAR | Status: AC
  Filled 2019-08-05: qty 2

## 2019-08-05 MED ORDER — ACETAMINOPHEN 10 MG/ML IV SOLN: 1000.0000 mg | Freq: Once | INTRAVENOUS | Status: DC | PRN

## 2019-08-05 MED ORDER — FENTANYL CITRATE (PF) 100 MCG/2ML IJ SOLN
INTRAMUSCULAR | Status: DC | PRN
  Administered 2019-08-05 (×2): 50 ug via INTRAVENOUS

## 2019-08-05 MED ORDER — ONDANSETRON HCL 4 MG/2ML IJ SOLN
INTRAMUSCULAR | Status: AC
  Filled 2019-08-05: qty 2

## 2019-08-05 MED ORDER — ACETAMINOPHEN 160 MG/5ML PO SOLN: 325.0000 mg | Freq: Once | ORAL | Status: DC | PRN

## 2019-08-05 SURGICAL SUPPLY — 24 items
BLADE SURG 15 STRL LF DISP TIS (BLADE) ×1 IMPLANT
BLADE SURG 15 STRL SS (BLADE) ×2
COVER WAND RF STERILE (DRAPES) IMPLANT
DRAIN PENROSE 0.25X18 (DRAIN) IMPLANT
DRAPE LAPAROTOMY T 102X78X121 (DRAPES) ×2 IMPLANT
DRSG PAD ABDOMINAL 8X10 ST (GAUZE/BANDAGES/DRESSINGS) ×4 IMPLANT
GAUZE 4X4 16PLY RFD (DISPOSABLE) ×2 IMPLANT
GAUZE SPONGE 4X4 12PLY STRL (GAUZE/BANDAGES/DRESSINGS) ×4 IMPLANT
GLOVE BIO SURGEON STRL SZ 6.5 (GLOVE) ×2 IMPLANT
GLOVE BIOGEL PI IND STRL 7.0 (GLOVE) ×1 IMPLANT
GLOVE BIOGEL PI INDICATOR 7.0 (GLOVE) ×1
GOWN STRL REUS W/TWL XL LVL3 (GOWN DISPOSABLE) ×4 IMPLANT
KIT TURNOVER KIT A (KITS) IMPLANT
PACK LITHOTOMY IV (CUSTOM PROCEDURE TRAY) ×2 IMPLANT
PENCIL SMOKE EVACUATOR (MISCELLANEOUS) ×2 IMPLANT
SURGILUBE 2OZ TUBE FLIPTOP (MISCELLANEOUS) ×2 IMPLANT
SUT CHROMIC 2 0 SH (SUTURE) ×2 IMPLANT
SUT SILK 2 0 (SUTURE)
SUT SILK 2-0 18XBRD TIE 12 (SUTURE) IMPLANT
SUT VIC AB 3-0 SH 27 (SUTURE) ×2
SUT VIC AB 3-0 SH 27XBRD (SUTURE) ×1 IMPLANT
TOWEL OR 17X26 10 PK STRL BLUE (TOWEL DISPOSABLE) ×2 IMPLANT
TOWEL OR NON WOVEN STRL DISP B (DISPOSABLE) ×2 IMPLANT
YANKAUER SUCT BULB TIP 10FT TU (MISCELLANEOUS) ×2 IMPLANT

## 2019-08-05 NOTE — Anesthesia Procedure Notes (Signed)
Procedure Name: Intubation Date/Time: 08/05/2019 7:43 AM Performed by: Glory Buff, CRNA Pre-anesthesia Checklist: Patient identified, Emergency Drugs available, Suction available and Patient being monitored Patient Re-evaluated:Patient Re-evaluated prior to induction Oxygen Delivery Method: Circle system utilized Preoxygenation: Pre-oxygenation with 100% oxygen Induction Type: IV induction Ventilation: Mask ventilation without difficulty Laryngoscope Size: Glidescope and 4 Grade View: Grade I Tube type: Oral Tube size: 7.5 mm Number of attempts: 1 Airway Equipment and Method: Stylet,  Oral airway and Video-laryngoscopy Placement Confirmation: ETT inserted through vocal cords under direct vision,  positive ETCO2 and breath sounds checked- equal and bilateral Secured at: 21 cm Tube secured with: Tape Dental Injury: Teeth and Oropharynx as per pre-operative assessment  Difficulty Due To: Difficulty was anticipated and Difficult Airway- due to limited oral opening Future Recommendations: Recommend- induction with short-acting agent, and alternative techniques readily available

## 2019-08-05 NOTE — Interval H&P Note (Signed)
History and Physical Interval Note:  08/05/2019 7:31 AM  [name]  has presented today for surgery, with the diagnosis of ANAL FISTULA.  The various methods of treatment have been discussed with the patient and family. After consideration of risks, benefits and other options for treatment, the patient has consented to  Procedure(s): ANAL EXAM UNDER ANESTHESIA, FISTULOTOMY VS SETON (N/A) as a surgical intervention.  The patient's history has been reviewed, patient examined, no change in status, stable for surgery.  I have reviewed the patient's chart and labs.  Questions were answered to the patient's satisfaction.     Rosario Adie

## 2019-08-05 NOTE — Op Note (Signed)
08/05/2019  8:11 AM  PATIENT:  [name]  39 y.o. male  Patient Care Team: Redmond School, MD as PCP - General (Internal Medicine) [name], MD as Attending Physician (Otolaryngology) Gery Pray, MD as Attending Physician (Radiation Oncology) Heath Lark, MD as Consulting Physician (Hematology and Oncology) Gala Romney Cristopher Estimable, MD as Consulting Physician (Gastroenterology)  PRE-OPERATIVE DIAGNOSIS:  ANAL FISTULA  POST-OPERATIVE DIAGNOSIS:  ANAL FISTULA  PROCEDURE:  ANAL EXAM UNDER ANESTHESIA, FISTULOTOMY    Surgeon(s): Leighton Ruff, MD  ASSISTANT: none   ANESTHESIA:   local and general  SPECIMEN:  No Specimen  DISPOSITION OF SPECIMEN:  N/A  COUNTS:  YES  PLAN OF CARE: Discharge to home after PACU  PATIENT DISPOSITION:  PACU - hemodynamically stable.  INDICATION: 39 year old male with a history of abscess and a chronically draining wound concerning for anal fistula   OR FINDINGS: Left anterior transsphincteric internal fistula  DESCRIPTION: the patient was identified in the preoperative holding area and taken to the OR where they were laid on the operating room table.  General anesthesia was induced without difficulty. The patient was then positioned in prone jackknife position with buttocks gently taped apart.  The patient was then prepped and draped in usual sterile fashion.  SCDs were noted to be in place prior to the initiation of anesthesia. A surgical timeout was performed indicating the correct patient, procedure, positioning and need for preoperative antibiotics.  A rectal block was performed using Marcaine with epinephrine.    I began with a digital rectal exam.  I then placed a Hill-Ferguson anoscope into the anal canal and evaluated this completely.  The patient had some grade 1 internal hemorrhoids that were not inflamed.  There was an internal opening at anterior midline.  This was very distal.  I placed an S shaped fistula probe into the external  opening and through the internal opening.  The fistula involved the superficial portion of the external sphincter and approximately 5% of the internal sphincter.  I decided to perform a fistulotomy.  This was done using electrocautery.  The edges were marsupialized using a 2-0 chromic suture.  I tacked the portion of the superficial external sphincter to the fistula tract using a 3-0 Vicryl interrupted sutures.  Additional Marcaine was placed around the incision.  Hemostasis was good.  A dressing was applied.  The patient was awakened from anesthesia and sent to the postanesthesia care unit in stable condition.  All counts were correct per operating room staff.

## 2019-08-05 NOTE — Discharge Instructions (Signed)
ANORECTAL SURGERY: POST OP INSTRUCTIONS °1. Take your usually prescribed home medications unless otherwise directed. °2. DIET: During the first few hours after surgery sip on some liquids until you are able to urinate.  It is normal to not urinate for several hours after this surgery.  If you feel uncomfortable, please contact the office for instructions.  After you are able to urinate,you may eat, if you feel like it.  Follow a light bland diet the first 24 hours after arrival home, such as soup, liquids, crackers, etc.  Be sure to include lots of fluids daily (6-8 glasses).  Avoid fast food or heavy meals, as your are more likely to get nauseated.  Eat a low fat diet the next few days after surgery.  Limit caffeine intake to 1-2 servings a day. °3. PAIN CONTROL: °a. Pain is best controlled by a usual combination of several different methods TOGETHER: °i. Muscle relaxation: Soak in a warm bath (or Sitz bath) three times a day and after bowel movements.  Continue to do this until all pain is resolved. °ii. Over the counter pain medication °iii. Prescription pain medication °b. Most patients will experience some swelling and discomfort in the anus/rectal area and incisions.  Heat such as warm towels, sitz baths, warm baths, etc to help relax tight/sore spots and speed recovery.  Some people prefer to use ice, especially in the first couple days after surgery, as it may decrease the pain and swelling, or alternate between ice & heat.  Experiment to what works for you.  Swelling and bruising can take several weeks to resolve.  Pain can take even longer to completely resolve. °c. It is helpful to take an over-the-counter pain medication regularly for the first few weeks.  Choose one of the following that works best for you: °i. Naproxen (Aleve, etc)  Two 220mg tabs twice a day °ii. Ibuprofen (Advil, etc) Three 200mg tabs four times a day (every meal & bedtime) °d. A  prescription for pain medication (such as percocet,  oxycodone, hydrocodone, etc) should be given to you upon discharge.  Take your pain medication as prescribed.  °i. If you are having problems/concerns with the prescription medicine (does not control pain, nausea, vomiting, rash, itching, etc), please call us [phone] to see if we need to switch you to a different pain medicine that will work better for you and/or control your side effect better. °ii. If you need a refill on your pain medication, please contact your pharmacy.  They will contact our office to request authorization. Prescriptions will not be filled after 5 pm or on week-ends. °4. KEEP YOUR BOWELS REGULAR and AVOID CONSTIPATION °a. The goal is one to two soft bowel movements a day.  You should at least have a bowel movement every other day. °b. Avoid getting constipated.  Between the surgery and the pain medications, it is common to experience some constipation. This can be very painful after rectal surgery.  Increasing fluid intake and taking a fiber supplement (such as Metamucil, Citrucel, FiberCon, etc) 1-2 times a day regularly will usually help prevent this problem from occurring.  A stool softener like colace is also recommended.  This can be purchased over the counter at your pharmacy.  You can take it up to 3 times a day.  If you do not have a bowel movement after 24 hrs since your surgery, take one does of milk of magnesia.  If you still haven't had a bowel movement 8-12 hours after   that dose, take another dose.  If you don't have a bowel movement 48 hrs after surgery, purchase a Fleets enema from the drug store and administer gently per package instructions.  If you still are having trouble with your bowel movements after that, please call the office for further instructions. °c. If you develop diarrhea or have many loose bowel movements, simplify your diet to bland foods & liquids for a few days.  Stop any stool softeners and decrease your fiber supplement.  Switching to mild  anti-diarrheal medications (Kayopectate, Pepto Bismol) can help.  If this worsens or does not improve, please call us. ° °5. Wound Care °a. Remove your bandages before your first bowel movement or 8 hours after surgery.     °b. Remove any wound packing material at this tim,e as well.  You do not need to repack the wound unless instructed otherwise.  Wear an absorbent pad or soft cotton gauze in your underwear to catch any drainage and help keep the area clean. You should change this every 2-3 hours while awake. °c. Keep the area clean and dry.  Bathe / shower every day, especially after bowel movements.  Keep the area clean by showering / bathing over the incision / wound.   It is okay to soak an open wound to help wash it.  Wet wipes or showers / gentle washing after bowel movements is often less traumatic than regular toilet paper. °d. You may have some styrofoam-like soft packing in the rectum which will come out with the first bowel movement.  °e. You will often notice bleeding with bowel movements.  This should slow down by the end of the first week of surgery °f. Expect some drainage.  This should slow down, too, by the end of the first week of surgery.  Wear an absorbent pad or soft cotton gauze in your underwear until the drainage stops. °g. Do Not sit on a rubber or pillow ring.  This can make you symptoms worse.  You may sit on a soft pillow if needed.  °6. ACTIVITIES as tolerated:   °a. You may resume regular (light) daily activities beginning the next day--such as daily self-care, walking, climbing stairs--gradually increasing activities as tolerated.  If you can walk 30 minutes without difficulty, it is safe to try more intense activity such as jogging, treadmill, bicycling, low-impact aerobics, swimming, etc. °b. Save the most intensive and strenuous activity for last such as sit-ups, heavy lifting, contact sports, etc  Refrain from any heavy lifting or straining until you are off narcotics for pain  control.   °c. You may drive when you are no longer taking prescription pain medication, you can comfortably sit for long periods of time, and you can safely maneuver your car and apply brakes. °d. You may have sexual intercourse when it is comfortable.  °7. FOLLOW UP in our office °a. Please call [facility] at [phone] to set up an appointment to see your surgeon in the office for a follow-up appointment approximately 3-4 weeks after your surgery. °b. Make sure that you call for this appointment the day you arrive home to insure a convenient appointment time. °10. IF YOU HAVE DISABILITY OR FAMILY LEAVE FORMS, BRING THEM TO THE OFFICE FOR PROCESSING.  DO NOT GIVE THEM TO YOUR DOCTOR. ° ° ° ° °WHEN TO CALL US [phone]: °1. Poor pain control °2. Reactions / problems with new medications (rash/itching, nausea, etc)  °3. Fever over 101.5 F (38.5 C) °4.   Inability to urinate °5. Nausea and/or vomiting °6. Worsening swelling or bruising °7. Continued bleeding from incision. °8. Increased pain, redness, or drainage from the incision ° °The clinic staff is available to answer your questions during regular business hours (8:30am-5pm).  Please don’t hesitate to call and ask to speak to one of our nurses for clinical concerns.   A surgeon from [facility] is always on call at the hospitals °  °If you have a medical emergency, go to the nearest emergency room or call 911. °  ° °[facility] °[address], [address], [place], [place]  [address] ? °MAIN: [phone] ? TOLL FREE: [phone] ? °FAX [phone] °[email or web address] ° ° ° °

## 2019-08-05 NOTE — Transfer of Care (Signed)
Immediate Anesthesia Transfer of Care Note  Patient: [name]  Procedure(s) Performed: ANAL EXAM UNDER ANESTHESIA, FISTULOTOMY (N/A Rectum)  Patient Location: PACU  Anesthesia Type:General  Level of Consciousness: drowsy, patient cooperative and responds to stimulation  Airway & Oxygen Therapy: Patient Spontanous Breathing and Patient connected to face mask oxygen  Post-op Assessment: Report given to RN and Post -op Vital signs reviewed and stable  Post vital signs: Reviewed and stable  Last Vitals:  Vitals Value Taken Time  BP    Temp    Pulse    Resp    SpO2      Last Pain:  Vitals:   08/05/19 0607  TempSrc: Oral         Complications: No complications documented.

## 2019-08-05 NOTE — Anesthesia Postprocedure Evaluation (Signed)
Anesthesia Post Note  Patient: [name]  Procedure(s) Performed: ANAL EXAM UNDER ANESTHESIA, FISTULOTOMY (N/A Rectum)     Patient location during evaluation: PACU Anesthesia Type: General Level of consciousness: awake and alert Pain management: pain level controlled Vital Signs Assessment: post-procedure vital signs reviewed and stable Respiratory status: spontaneous breathing, nonlabored ventilation, respiratory function stable and patient connected to nasal cannula oxygen Cardiovascular status: blood pressure returned to baseline and stable Postop Assessment: no apparent nausea or vomiting Anesthetic complications: no   No complications documented.  Last Vitals:  Vitals:   08/05/19 0945 08/05/19 1001  BP: (!) 137/91 (!) 141/88  Pulse: 79 85  Resp:  16  Temp:    SpO2: 99% 99%    Last Pain:  Vitals:   08/05/19 1001  TempSrc:   PainSc: 0-No pain                 Effie Berkshire

## 2019-08-06 ENCOUNTER — Encounter (HOSPITAL_COMMUNITY): Payer: Self-pay | Admitting: General Surgery

## 2020-04-11 DIAGNOSIS — R200 Anesthesia of skin: Secondary | ICD-10-CM | POA: Insufficient documentation

## 2020-04-13 ENCOUNTER — Other Ambulatory Visit: Payer: Self-pay | Admitting: Radiology

## 2020-04-13 ENCOUNTER — Other Ambulatory Visit: Payer: Self-pay | Admitting: Otolaryngology

## 2020-04-13 DIAGNOSIS — Z923 Personal history of irradiation: Secondary | ICD-10-CM

## 2020-04-13 DIAGNOSIS — R200 Anesthesia of skin: Secondary | ICD-10-CM

## 2020-04-13 DIAGNOSIS — Z85810 Personal history of malignant neoplasm of tongue: Secondary | ICD-10-CM

## 2020-04-17 ENCOUNTER — Telehealth: Payer: Self-pay | Admitting: Hematology and Oncology

## 2020-04-17 NOTE — Telephone Encounter (Signed)
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 Andree Elk phone # [phone]

## 2020-04-18 ENCOUNTER — Telehealth: Payer: Self-pay | Admitting: Hematology and Oncology

## 2020-04-18 NOTE — Telephone Encounter (Signed)
I spoke with Dr. Andree Elk and reviewed recent CT finding and documentation by ENT I suggested to Dr. Andree Elk to refer him to an oral surgeon for evaluation for possible osteonecrosis from prior radiation exposure

## 2020-05-02 ENCOUNTER — Other Ambulatory Visit: Payer: Self-pay

## 2020-05-02 ENCOUNTER — Ambulatory Visit
Admission: RE | Admit: 2020-05-02 | Discharge: 2020-05-02 | Disposition: A | Discharge status: Home / Self Care | Payer: BC Managed Care – PPO | Source: Ambulatory Visit | Attending: Otolaryngology | Admitting: Otolaryngology

## 2020-05-02 DIAGNOSIS — R200 Anesthesia of skin: Secondary | ICD-10-CM

## 2020-05-02 DIAGNOSIS — Z923 Personal history of irradiation: Secondary | ICD-10-CM

## 2020-05-02 DIAGNOSIS — Z85810 Personal history of malignant neoplasm of tongue: Secondary | ICD-10-CM

## 2020-05-02 MED ORDER — IOPAMIDOL (ISOVUE-300) INJECTION 61%
75.0000 mL | Freq: Once | INTRAVENOUS | Status: AC | PRN
  Administered 2020-05-02: 75 mL via INTRAVENOUS

## 2020-05-19 ENCOUNTER — Other Ambulatory Visit (HOSPITAL_COMMUNITY): Payer: Self-pay | Admitting: Otolaryngology

## 2020-05-19 DIAGNOSIS — Z85810 Personal history of malignant neoplasm of tongue: Secondary | ICD-10-CM

## 2020-05-22 ENCOUNTER — Other Ambulatory Visit (HOSPITAL_COMMUNITY): Payer: Self-pay | Admitting: Otolaryngology

## 2020-05-22 ENCOUNTER — Encounter (HOSPITAL_COMMUNITY): Payer: Self-pay

## 2020-05-22 DIAGNOSIS — Z85810 Personal history of malignant neoplasm of tongue: Secondary | ICD-10-CM

## 2020-05-22 NOTE — Progress Notes (Signed)
        [name] Male, 40 y.o., [birth year]  MRN:  [id number] Phone:  [phone] [name])       PCP:  Redmond School, MD Coverage:  Sherre Poot [employer] Ppo  Next Appt With Radiology ([facility]) 05/24/2020 at 1:00 PM          RE: Biopsy Received: 3 days ago  Message Details  [name], MD  [name]   [place] core submental mass    [facility]    Previous Messages  ----- Message -----  From: [name]  Sent: 05/19/2020  4:09 PM EDT  To: [name]  Subject: Biopsy                      Procedure Requested: CT Biopsy    Reason for Procedure: History of tongue cancer and radiation, New submental mass identified on CT Scan     Provider Requesting:  [name]  Provider Telephone: [phone]    Other Info:

## 2020-05-23 ENCOUNTER — Other Ambulatory Visit: Payer: Self-pay | Admitting: Radiology

## 2020-05-24 ENCOUNTER — Other Ambulatory Visit: Payer: Self-pay

## 2020-05-24 ENCOUNTER — Ambulatory Visit (HOSPITAL_COMMUNITY)
Admission: RE | Admit: 2020-05-24 | Discharge: 2020-05-24 | Disposition: A | Discharge status: Home / Self Care | Payer: BC Managed Care – PPO | Source: Ambulatory Visit | Attending: Otolaryngology | Admitting: Otolaryngology

## 2020-05-24 ENCOUNTER — Encounter (HOSPITAL_COMMUNITY): Payer: Self-pay

## 2020-05-24 DIAGNOSIS — Z85810 Personal history of malignant neoplasm of tongue: Secondary | ICD-10-CM

## 2020-05-24 DIAGNOSIS — R221 Localized swelling, mass and lump, neck: Secondary | ICD-10-CM | POA: Insufficient documentation

## 2020-05-24 DIAGNOSIS — Z923 Personal history of irradiation: Secondary | ICD-10-CM | POA: Diagnosis not present

## 2020-05-24 DIAGNOSIS — C029 Malignant neoplasm of tongue, unspecified: Secondary | ICD-10-CM | POA: Insufficient documentation

## 2020-05-24 MED ORDER — MIDAZOLAM HCL 2 MG/2ML IJ SOLN
INTRAMUSCULAR | Status: AC
  Filled 2020-05-24: qty 4

## 2020-05-24 MED ORDER — SODIUM CHLORIDE 0.9 % IV SOLN: INTRAVENOUS | Status: DC

## 2020-05-24 MED ORDER — FENTANYL CITRATE (PF) 100 MCG/2ML IJ SOLN
INTRAMUSCULAR | Status: AC | PRN
  Administered 2020-05-24: 50 ug via INTRAVENOUS

## 2020-05-24 MED ORDER — MIDAZOLAM HCL 2 MG/2ML IJ SOLN
INTRAMUSCULAR | Status: AC | PRN
  Administered 2020-05-24: 1 mg via INTRAVENOUS

## 2020-05-24 MED ORDER — FENTANYL CITRATE (PF) 100 MCG/2ML IJ SOLN
INTRAMUSCULAR | Status: AC
  Filled 2020-05-24: qty 4

## 2020-05-24 MED ORDER — LIDOCAINE HCL (PF) 1 % IJ SOLN
INTRAMUSCULAR | Status: AC
  Filled 2020-05-24: qty 30

## 2020-05-24 NOTE — Discharge Instructions (Addendum)
Needle Biopsy, Care After These instructions tell you how to care for yourself after your procedure. Your doctor may also give you more specific instructions. Call your doctor if you have any problems or questions. What can I expect after the procedure? After the procedure, it is common to have:  Soreness.  Bruising.  Mild pain. Follow these instructions at home:  Return to your normal activities as told by your doctor. Ask your doctor what activities are safe for you.  Take over-the-counter and prescription medicines only as told by your doctor.  Wash your hands with soap and water before you change your bandage (dressing). If you cannot use soap and water, use hand sanitizer.  Follow instructions from your doctor about: ? How to take care of your puncture site. ? When and how to change your bandage. ? When to remove your bandage.  Check your puncture site every day for signs of infection. Watch for: ? Redness, swelling, or pain. ? Fluid or blood. ? Pus or a bad smell. ? Warmth.  Do not take baths, swim, or use a hot tub until your doctor approves. Ask your doctor if you may take showers. You may only be allowed to take sponge baths.  Keep all follow-up visits as told by your doctor. This is important.   Contact a doctor if you have:  A fever.  Redness, swelling, or pain at the puncture site, and it lasts longer than a few days.  Fluid, blood, or pus coming from the puncture site.  Warmth coming from the puncture site. Get help right away if:  You have a lot of bleeding from the puncture site. Summary  After the procedure, it is common to have soreness, bruising, or mild pain at the puncture site.  Check your puncture site every day for signs of infection, such as redness, swelling, or pain.  Get help right away if you have severe bleeding from your puncture site. This information is not intended to replace advice given to you by your health care provider. Make  sure you discuss any questions you have with your health care provider. Document Revised: 07/15/2019 Document Reviewed: 07/15/2019 Elsevier Patient Education  2021 Else[employer]. Moderate Conscious Sedation, Adult, Care After This sheet gives you information about how to care for yourself after your procedure. Your health care provider may also give you more specific instructions. If you have problems or questions, contact your health care provider. What can I expect after the procedure? After the procedure, it is common to have:  Sleepiness for several hours.  Impaired judgment for several hours.  Difficulty with balance.  Vomiting if you eat too soon. Follow these instructions at home: For the time period you were told by your health care provider:  Rest.  Do not participate in activities where you could fall or become injured.  Do not drive or use machinery.  Do not drink alcohol.  Do not take sleeping pills or medicines that cause drowsiness.  Do not make important decisions or sign legal documents.  Do not take care of children on your own.      Eating and drinking  Follow the diet recommended by your health care provider.  Drink enough fluid to keep your urine pale yellow.  If you vomit: ? Drink water, juice, or soup when you can drink without vomiting. ? Make sure you have little or no nausea before eating solid foods.   General instructions  Take over-the-counter and prescription medicines only as   told by your health care provider.  Have a responsible adult stay with you for the time you are told. It is important to have someone help care for you until you are awake and alert.  Do not smoke.  Keep all follow-up visits as told by your health care provider. This is important. Contact a health care provider if:  You are still sleepy or having trouble with balance after 24 hours.  You feel light-headed.  You keep feeling nauseous or you keep  vomiting.  You develop a rash.  You have a fever.  You have redness or swelling around the IV site. Get help right away if:  You have trouble breathing.  You have new-onset confusion at home. Summary  After the procedure, it is common to feel sleepy, have impaired judgment, or feel nauseous if you eat too soon.  Rest after you get home. Know the things you should not do after the procedure.  Follow the diet recommended by your health care provider and drink enough fluid to keep your urine pale yellow.  Get help right away if you have trouble breathing or new-onset confusion at home. This information is not intended to replace advice given to you by your health care provider. Make sure you discuss any questions you have with your health care provider. Document Revised: 05/14/2019 Document Reviewed: 12/10/2018 Elsevier Patient Education  2021 Elsevier Inc.  

## 2020-05-24 NOTE — Procedures (Signed)
Interventional Radiology Procedure Note  Procedure: [place] CORE BX SUBMENTAL MASS    Complications: None  Estimated Blood Loss:  MIN  Findings: 18 G 1CM CORES X 3    M. Daryll Brod, MD

## 2020-05-24 NOTE — H&P (Signed)
Chief Complaint: Patient was seen in consultation today for submental mass biopsy at the request of Rosen,Jefry  Referring Physician(s): Rosen,Jefry  Supervising Physician: Ruel Favors  Patient Status: [facility] - Out-pt  History of Present Illness: [name] is a 40 y.o. male with hx of tongue cancer in 2014. Completed surgical resection and adjuvant therapy. Now found to have submental mass. Referred for image guided biopsy. PMHx, meds, labs, imaging, allergies reviewed. Feels well, no recent fevers, chills, illness. Has been NPO today as directed.   Past Medical History:  Diagnosis Date  . Bradycardia 03/16/2014  . Dry mouth    radiation  . GERD (gastroesophageal reflux disease)    occ  . Gout   . Head and neck cancer (HCC)    tongue cancer  . High triglycerides   . History of kidney stones   . History of radiation therapy 06/02/12- 07/14/12   oral tongue and bilateral neck, 60 gray in 30 fx  . Hypothyroidism    radiation- not working  . PONV (postoperative nausea and vomiting)    tongue swelled-emergency trach  . Ringing in ears   . Staph skin infection    Elbow- resolved.    . Stones in the urinary tract   . Tongue cancer (HCC)   . Xerostomia 11/30/2012    Past Surgical History:  Procedure Laterality Date  . COLONOSCOPY N/A 06/16/2019   Procedure: COLONOSCOPY;  Surgeon: Corbin Ade, MD;  Location: [facility];  Service: Endoscopy;  Laterality: N/A;  1:15pm  . CYSTOSCOPY WITH RETROGRADE PYELOGRAM, URETEROSCOPY AND STENT PLACEMENT Left 04/16/2013   Procedure: CYSTOSCOPY WITH RETROGRADE PYELOGRAM, URETEROSCOPY AND STENT PLACEMENT;  Surgeon: Milford Cage, MD;  Location: [facility] ORS;  Service: Urology;  Laterality: Left;  . FISTULOTOMY N/A 08/05/2019   Procedure: ANAL EXAM UNDER ANESTHESIA, FISTULOTOMY;  Surgeon: Romie Levee, MD;  Location: [facility] ORS;  Service: General;  Laterality: N/A;  . GASTROSTOMY W/ FEEDING TUBE  2/14  . GLOSSECTOMY    .  GLOSSECTOMY  08/22/2011   Procedure: GLOSSECTOMY;  Surgeon: Serena Colonel, MD;  Location: [facility] OR;  Service: ENT;  Laterality: Right;  WITH FROZEN SECTION  . GLOSSECTOMY  09/06/2011   Procedure: GLOSSECTOMY;  Surgeon: Serena Colonel, MD;  Location: [facility] OR;  Service: ENT;  Laterality: Right;  Right Tongue Re-Excision  . HEMIGLOSSECTOMY N/A 03/23/2012   Procedure: BIOPSY OF TONGUE WITH FROZEN SECTION AND RIGHT HEMIGLOSSECTOMY;  Surgeon: Serena Colonel, MD;  Location: [facility] OR;  Service: ENT;  Laterality: N/A;  . HOLMIUM LASER APPLICATION Left 04/16/2013   Procedure: HOLMIUM LASER APPLICATION;  Surgeon: Milford Cage, MD;  Location: [facility] ORS;  Service: Urology;  Laterality: Left;  . MOUTH SURGERY     biopsy- (08/06/2011 Dr. Barbette Merino), also had tissue graft (oral) 2010  . RADICAL NECK DISSECTION  09/06/2011   Procedure: RADICAL NECK DISSECTION;  Surgeon: Serena Colonel, MD;  Location: [facility] OR;  Service: ENT;  Laterality: Right;  Right Modified Neck Dissection   . RADICAL NECK DISSECTION Left 04/23/2012   Procedure: LEFT MODIFIED  NECK DISSECTION;  Surgeon: Serena Colonel, MD;  Location: [facility] OR;  Service: ENT;  Laterality: Left;  Left Supra Omo Hyoid Neck Dissection   . TONGUE BIOPSY    . TRACHEOSTOMY CLOSURE     14  . TRACHEOSTOMY TUBE PLACEMENT N/A 03/23/2012   Procedure: TRACHEOSTOMY;  Surgeon: Serena Colonel, MD;  Location: [facility] OR;  Service: ENT;  Laterality: N/A;    Allergies: Patient has no known  allergies.  Medications: Prior to Admission medications   Medication Sig Start Date End Date Taking? Authorizing Provider  colchicine 0.6 MG tablet Take 0.6 mg by mouth daily as needed (gout flare). 03/31/19  Yes [provider]  loratadine (CLARITIN) 10 MG tablet Take 10 mg by mouth daily as needed for allergies.   Yes [provider]     Family History  Problem Relation Age of Onset  . Rheum arthritis Mother   . Asthma Father   . Cancer Maternal Grandmother   . Diabetes Sister   . Diabetes Brother    . Colon cancer Neg Hx     Social History   Socioeconomic History  . Marital status: Married    Spouse name: Not on file  . Number of children: 1  . Years of education: Not on file  . Highest education level: Not on file  Occupational History    Employer: [employer]    Comment: AC unit.   Tobacco Use  . Smoking status: Former Smoker    Years: 15.00    Types: Cigars    Quit date: 08/14/2011    Years since quitting: 8.7  . Smokeless tobacco: Never Used  Vaping Use  . Vaping Use: Never used  Substance and Sexual Activity  . Alcohol use: No    Comment: quit drinking 7/13 from 12 pack of beer on weekends  . Drug use: No  . Sexual activity: Yes  Other Topics Concern  . Not on file  Social History Narrative   Married.   History of smoking 5 small cigars daily but quit in 2013.   History of drinking approximately a 12 pack of beer on weekends but quit in July 2013.         Social Determinants of Health   Financial Resource Strain: Not on file  Food Insecurity: Not on file  Transportation Needs: Not on file  Physical Activity: Not on file  Stress: Not on file  Social Connections: Not on file    Review of Systems: A 12 point ROS discussed and pertinent positives are indicated in the HPI above.  All other systems are negative.  Review of Systems  Vital Signs: BP (!) 122/98   Pulse (!) 115   Temp 98.4 F (36.9 C) (Oral)   Ht 6\' 2"  (1.88 m)   Wt 120.2 kg   SpO2 100%   BMI 34.02 kg/m   Physical Exam Constitutional:      Appearance: Normal appearance.  HENT:     Mouth/Throat:     Mouth: Mucous membranes are moist.     Pharynx: Oropharynx is clear.  Cardiovascular:     Rate and Rhythm: Normal rate and regular rhythm.     Heart sounds: Normal heart sounds.  Pulmonary:     Effort: Pulmonary effort is normal.     Breath sounds: Normal breath sounds.  Skin:    General: Skin is warm and dry.  Neurological:     General: No focal deficit present.      Mental Status: He is alert. Mental status is at baseline.  Psychiatric:        Mood and Affect: Mood normal.        Thought Content: Thought content normal.        Judgment: Judgment normal.     Imaging: CT SOFT TISSUE NECK W CONTRAST  Result Date: 05/02/2020 CLINICAL DATA:  Numbness in the lower lip and gum. Burning in the mouth. Personal history of  tongue cancer status post radical neck dissection and glossectomy. Radiation therapy. EXAM: CT NECK WITH CONTRAST TECHNIQUE: Multidetector CT imaging of the neck was performed using the standard protocol following the bolus administration of intravenous contrast. CONTRAST:  36mL ISOVUE-300 IOPAMIDOL (ISOVUE-300) INJECTION 61% COMPARISON:  CT neck with contrast 08/15/2014 and 09/06/2013 FINDINGS: Pharynx and larynx: A peripherally enhancing mass is present at the anterior tongue base measuring 4.3 x 2.7 x 2.7 cm. There is focal sclerosis of the anterior genu of the mandible. Or posterior tongue is within normal limits. Nasopharynx and soft palate are normal. Vallecula and epiglottis are within normal limits. Aryepiglottic folds and piriform sinuses are clear. Vocal cords are midline and symmetric. Trachea is clear. Salivary glands: Parotid glands are within normal limits bilaterally. Submandibular glands are resected. Thyroid: Normal Lymph nodes: No significant right-sided recurrent lymph nodes are present. An 8 mm peripherally enhancing left level 2 lymph node present near the tail of the parotid gland just anterior to the retromandibular vein on image 30 of series 100. No other significant recurrent left-sided lymph nodes are present. Vascular: Post radiation changes are noted in the carotid sheath bilaterally. No significant stenosis is present. Limited intracranial: Within normal limits. Mastoids and visualized paranasal sinuses: Polyps or mucous retention cysts are noted within the maxillary sinuses bilaterally. Mastoid air cells are clear. Skeleton:  Vertebral body heights and alignment are normal. No focal lytic or blastic lesions are present in the cervical spine. Sclerotic changes are present in the genu of the mandible. Upper chest: Post radiation changes are noted at the lung apices bilaterally. Lung apices are otherwise clear. Thoracic inlet is within normal limits. Other: IMPRESSION: 1. 4.3 x 2.7 x 2.7 cm peripherally enhancing mass at the anterior tongue base with focal sclerosis of the anterior genu of the mandible. 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 2. 8 mm peripherally enhancing left level 2 lymph node is concerning for metastatic disease. Recommend PET scan of the neck to further characterize disease. 3. No other significant recurrent left-sided lymph nodes. 4. Post radiation changes of the carotid sheath bilaterally. 5. Post radiation changes at the lung apices bilaterally. 6. Polyps or mucous retention cysts within the maxillary sinuses bilaterally. These results will be called to the ordering clinician or representative by the Radiologist Assistant, and communication documented in the PACS or [employer]. Electronically Signed   By: San Morelle M.D.   On: 05/02/2020 21:22    Labs:  CBC: Recent Labs    07/29/19 0907  WBC 6.7  HGB 15.1  HCT 44.8  PLT 290    COAGS: No results for input(s): INR, APTT in the last 8760 hours.  BMP: No results for input(s): NA, K, CL, CO2, GLUCOSE, BUN, CALCIUM, CREATININE, GFRNONAA, GFRAA in the last 8760 hours.  Invalid input(s): CMP  LIVER FUNCTION TESTS: No results for input(s): BILITOT, AST, ALT, ALKPHOS, PROT, ALBUMIN in the last 8760 hours.  TUMOR MARKERS: No results for input(s): AFPTM, CEA, CA199, CHROMGRNA in the last 8760 hours.  Assessment and Plan: Submental mass Hx of prior tongue cancer For US guided biopsy Risks and benefits of submental mass bx was discussed with the patient and/or patient's  family including, but not limited to bleeding, infection, damage to adjacent structures or low yield requiring additional tests.  All of the questions were answered and there is agreement to proceed.  Consent signed and in chart.   Thank you for this interesting consult.  I greatly enjoyed meeting [name] and look forward to participating in their care.  A copy of this report was sent to the requesting provider on this date.  Electronically Signed: Ascencion Dike, PA-C 05/24/2020, 12:00 PM   I spent a total of 20 minutes in face to face in clinical consultation, greater than 50% of which was counseling/coordinating care for submental mass bx

## 2020-05-29 LAB — SURGICAL PATHOLOGY

## 2020-05-30 ENCOUNTER — Telehealth: Payer: Self-pay

## 2020-05-30 ENCOUNTER — Inpatient Hospital Stay: Payer: BC Managed Care – PPO | Attending: Hematology and Oncology | Admitting: Hematology and Oncology

## 2020-05-30 ENCOUNTER — Other Ambulatory Visit: Payer: Self-pay

## 2020-05-30 ENCOUNTER — Encounter: Payer: Self-pay | Admitting: Hematology and Oncology

## 2020-05-30 VITALS — BP 122/93 | HR 96 | Temp 98.1°F | Resp 18 | Ht 74.0 in | Wt 267.6 lb

## 2020-05-30 DIAGNOSIS — R918 Other nonspecific abnormal finding of lung field: Secondary | ICD-10-CM | POA: Diagnosis not present

## 2020-05-30 DIAGNOSIS — Z5112 Encounter for antineoplastic immunotherapy: Secondary | ICD-10-CM | POA: Diagnosis present

## 2020-05-30 DIAGNOSIS — Z5111 Encounter for antineoplastic chemotherapy: Secondary | ICD-10-CM | POA: Insufficient documentation

## 2020-05-30 DIAGNOSIS — G893 Neoplasm related pain (acute) (chronic): Secondary | ICD-10-CM

## 2020-05-30 DIAGNOSIS — C029 Malignant neoplasm of tongue, unspecified: Secondary | ICD-10-CM | POA: Diagnosis present

## 2020-05-30 DIAGNOSIS — B370 Candidal stomatitis: Secondary | ICD-10-CM | POA: Insufficient documentation

## 2020-05-30 DIAGNOSIS — K1231 Oral mucositis (ulcerative) due to antineoplastic therapy: Secondary | ICD-10-CM | POA: Insufficient documentation

## 2020-05-30 MED ORDER — MORPHINE SULFATE 15 MG PO TABS: 15.0000 mg | ORAL_TABLET | ORAL | 0 refills | Status: DC | PRN

## 2020-05-30 NOTE — Telephone Encounter (Signed)
Spoke with pt regarding his previous VM and biopsy results per MD Alvy Bimler. Pt verbalizes that he can come today (05/30/20) at 1300 for an appt with MD Alvy Bimler. Pt verbalizes that he will arrive to [facility] at 1245 to check in.

## 2020-05-30 NOTE — Assessment & Plan Note (Signed)
He has significant mouth pain secondary to his disease I recommend a trial of morphine sulfate I warned him of risk of sedation and constipation We discussed narcotic refill policy

## 2020-05-30 NOTE — Progress Notes (Signed)
[place] CONSULT NOTE  Patient Care Team: Redmond School, MD as PCP - General (Internal Medicine) [name], MD as Attending Physician (Otolaryngology) Gery Pray, MD as Attending Physician (Radiation Oncology) Heath Lark, MD as Consulting Physician (Hematology and Oncology) Daneil Dolin, MD as Consulting Physician (Gastroenterology)  ASSESSMENT & PLAN:  Tongue cancer I have reviewed his recent CT imaging and gave patient copies of his biopsy report I am concerned given the aggressive nature of the recurrent disease with high Ki-67 and sarcomatoid features, which is drastically different from his previous well differentiated squamous cell carcinoma I will order a PET CT scan for staging The patient is scheduled to see ENT surgeon at [facility] next week for possible aggressive surgery followed by reconstruction I am very concerned about poor wound healing given his irradiated tissue in his head and neck region I would defer to the ENT surgeon to discuss the risk and benefits of surgery and reconstruction If the complication risks are high, and if the patient decides against surgery, I will offer him palliative chemotherapy   Cancer associated pain He has significant mouth pain secondary to his disease I recommend a trial of morphine sulfate I warned him of risk of sedation and constipation We discussed narcotic refill policy   Orders Placed This Encounter  Procedures  . NM PET Image Restage (PS) Skull Base to Thigh    Standing Status:   Future    Standing Expiration Date:   05/30/2021    Order Specific Question:   If indicated for the ordered procedure, I authorize the administration of a radiopharmaceutical per Radiology protocol    Answer:   Yes    Order Specific Question:   Preferred imaging location?    Answer:   [facility]    Order Specific Question:   Radiology Contrast Protocol - do NOT remove file path    Answer:    [email or web address]    The total time spent in the appointment was 60 minutes encounter with patients including review of chart and various tests results, discussions about plan of care and coordination of care plan   All questions were answered. The patient knows to call the clinic with any problems, questions or concerns. No barriers to learning was detected.  Heath Lark, MD 5/3/20222:32 PM  CHIEF COMPLAINTS/PURPOSE OF CONSULTATION:  Recurrent tongue cancer  HISTORY OF PRESENTING ILLNESS:  [name] 40 y.o. male is here because of recurrent tongue cancer  The patient is well-known to me.  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 status post surgery, chemotherapy and radiation treatment The patient contacted my office earlier this morning, informing me of cancer relapse Since last time I saw him, he denies smoking or drinking Approximately a month ago, he started to notice some swelling on his chin, associated with burning sensation within his mouth On the external surface of his face, he described numbness He was evaluated by ENT surgeon who ordered multiple testing including imaging study & ultrasound-guided biopsy  I have reviewed his chart and materials related to his cancer extensively and collaborated history with the patient. Summary of oncologic history is as follows: Oncology History  Tongue cancer ([place])  08/22/2011 Surgery   He had partial glossectomy which showed invasive SCC measured 1.3 cm   09/06/2011 Surgery   He underwent right neck dissection which showed 1/24 LN involved and repeat resection of tongue was negative  03/23/2012 Surgery   Repeat tngue resection showed well differentiated SCC 1.5 cm which invaded into the muscle   04/17/2012 Imaging   PET/CT scan showed New adenopathy in the left neck, compatible with recurrent malignancy   04/23/2012  Surgery   he underwent left neck LN dissection and 2/34 LN were positve   06/01/2012 - 07/13/2012 Chemotherapy   Patient had 3 cycles of cisplatin   06/12/2012 - 07/14/2012 Radiation Therapy   Patient completed RT   12/10/2012 Imaging   Interval resolution of previous hypermetabolic cervical adenopathy. There is nonspecific asymmetric increased uptake within the right side of tongue   03/13/2013 Imaging   PET/CT scan showed no evidence of disease recurrence. He was noted to have incidental kidney stones   08/15/2014 Imaging   CT scan of the dissection no evidence of disease   05/02/2020 Imaging   CT scan of neck 1. 4.3 x 2.7 x 2.7 cm peripherally enhancing mass at the anterior tongue base with focal sclerosis of the anterior genu of the mandible. 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 2. 8 mm peripherally enhancing left level 2 lymph node is concerning for metastatic disease. Recommend PET scan of the neck to further characterize disease. 3. No other significant recurrent left-sided lymph nodes. 4. Post radiation changes of the carotid sheath bilaterally. 5. Post radiation changes at the lung apices bilaterally. 6. Polyps or mucous retention cysts within the maxillary sinuses bilaterally.   05/24/2020 Pathology Results   A. SUBMENTAL MASS, MIDLINE, NEEDLE CORE BIOPSY:  - Poorly differentiated sarcomatoid malignancy.   COMMENT:   CK7 is positive.  CKAE1/AE3, CK8/18, CK20, p40, CK5/6, S-100, Melan-A and Desmin are negative.  SMA demonstrates nonspecific staining.  Ki-67 proliferation index is high.  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     05/24/2020 Procedure   Successful ultrasound submental mass 18 gauge core biopsy    He is currently working full-time.  He has daughter who is in college.  He has 2 other children at home, age 24 and 74 Despite pain, he still able to  swallow food.  He denies dysphagia No new lymphadenopathy He denies recent weight loss He has regular dental visit 4 times a year  MEDICAL HISTORY:  Past Medical History:  Diagnosis Date  . Bradycardia 03/16/2014  . Dry mouth    radiation  . GERD (gastroesophageal reflux disease)    occ  . Gout   . Head and neck cancer ([place])    tongue cancer  . High triglycerides   . History of kidney stones   . History of radiation therapy 06/02/12- 07/14/12   oral tongue and bilateral neck, 60 gray in 30 fx  . Hypothyroidism    radiation- not working  . PONV (postoperative nausea and vomiting)    tongue swelled-emergency trach  . Ringing in ears   . Staph skin infection    Elbow- resolved.    . Stones in the urinary tract   . Tongue cancer ([place])   . Xerostomia 11/30/2012    SURGICAL HISTORY: Past Surgical History:  Procedure Laterality Date  . COLONOSCOPY N/A 06/16/2019   Procedure: COLONOSCOPY;  Surgeon: Daneil Dolin, MD;  [facility];  Service: Endoscopy;  Laterality: N/A;  1:15pm  . CYSTOSCOPY WITH RETROGRADE PYELOGRAM, URETEROSCOPY AND STENT PLACEMENT Left 04/16/2013   Procedure: CYSTOSCOPY WITH RETROGRADE PYELOGRAM, URETEROSCOPY AND STENT PLACEMENT;  Surgeon: Molli Hazard, MD;  Location: [facility];  Service: Urology;  Laterality: Left;  . FISTULOTOMY N/A 08/05/2019   Procedure: ANAL EXAM UNDER ANESTHESIA, FISTULOTOMY;  Surgeon: Leighton Ruff, MD;  Location: [facility];  Service: General;  Laterality: N/A;  . GASTROSTOMY W/ FEEDING TUBE  2/14  . GLOSSECTOMY    . GLOSSECTOMY  08/22/2011   Procedure: GLOSSECTOMY;  Surgeon: [name], MD;  Location: [place];  Service: ENT;  Laterality: Right;  WITH FROZEN SECTION  . GLOSSECTOMY  09/06/2011   Procedure: GLOSSECTOMY;  Surgeon: [name], MD;  Location: [facility] OR;  Service: ENT;  Laterality: Right;  Right Tongue Re-Excision  . HEMIGLOSSECTOMY N/A 03/23/2012   Procedure: BIOPSY OF TONGUE WITH FROZEN SECTION AND RIGHT HEMIGLOSSECTOMY;   Surgeon: [name], MD;  Location: [place];  Service: ENT;  Laterality: N/A;  . HOLMIUM LASER APPLICATION Left 0/76/2263   Procedure: HOLMIUM LASER APPLICATION;  Surgeon: Molli Hazard, MD;  Location: [facility];  Service: Urology;  Laterality: Left;  . MOUTH SURGERY     biopsy- (08/06/2011 Dr. Hoyt Koch), also had tissue graft (oral) 2010  . RADICAL NECK DISSECTION  09/06/2011   Procedure: RADICAL NECK DISSECTION;  Surgeon: [name], MD;  Location: [place];  Service: ENT;  Laterality: Right;  Right Modified Neck Dissection   . RADICAL NECK DISSECTION Left 04/23/2012   Procedure: LEFT MODIFIED  NECK DISSECTION;  Surgeon: [name], MD;  Location: [place];  Service: ENT;  Laterality: Left;  Left Supra Omo Hyoid Neck Dissection   . TONGUE BIOPSY    . TRACHEOSTOMY CLOSURE     14  . TRACHEOSTOMY TUBE PLACEMENT N/A 03/23/2012   Procedure: TRACHEOSTOMY;  Surgeon: [name], MD;  Location: [facility] OR;  Service: ENT;  Laterality: N/A;    SOCIAL HISTORY: Social History   Socioeconomic History  . Marital status: Married    Spouse name: Not on file  . Number of children: 3  . Years of education: Not on file  . Highest education level: Not on file  Occupational History  . Occupation: Music therapist: [employer]    Comment: AC unit.   Tobacco Use  . Smoking status: Former Smoker    Years: 15.00    Types: Cigars    Quit date: 08/14/2011    Years since quitting: 8.8  . Smokeless tobacco: Never Used  Vaping Use  . Vaping Use: Never used  Substance and Sexual Activity  . Alcohol use: No    Comment: quit drinking 7/13 from 12 pack of beer on weekends  . Drug use: No  . Sexual activity: Yes  Other Topics Concern  . Not on file  Social History Narrative   Married.   History of smoking 5 small cigars daily but quit in 2013.   History of drinking approximately a 12 pack of beer on weekends but quit in July 2013.         Social Determinants of Health   Financial Resource Strain:  Not on file  Food Insecurity: Not on file  Transportation Needs: Not on file  Physical Activity: Not on file  Stress: Not on file  Social Connections: Not on file  Intimate Partner Violence: Not on file    FAMILY HISTORY: Family History  Problem Relation Age of Onset  . Rheum arthritis Mother   . Asthma Father   . Cancer Maternal Grandmother   . Diabetes Sister   . Diabetes Brother   . Colon cancer Neg Hx  ALLERGIES:  has No Known Allergies.  MEDICATIONS:  Current Outpatient Medications  Medication Sig Dispense Refill  . morphine (MSIR) 15 MG tablet Take 1 tablet (15 mg total) by mouth every 4 (four) hours as needed for severe pain. 30 tablet 0  . loratadine (CLARITIN) 10 MG tablet Take 10 mg by mouth daily as needed for allergies.     No current facility-administered medications for this visit.    REVIEW OF SYSTEMS:   Constitutional: Denies fevers, chills or abnormal night sweats Eyes: Denies blurriness of vision, double vision or watery eyes Ears, nose, mouth, throat, and face: Denies mucositis or sore throat Respiratory: Denies cough, dyspnea or wheezes Cardiovascular: Denies palpitation, chest discomfort or lower extremity swelling Gastrointestinal:  Denies nausea, heartburn or change in bowel habits Skin: Denies abnormal skin rashes Lymphatics: Denies new lymphadenopathy or easy bruising Neurological:Denies numbness, tingling or new weaknesses Behavioral/Psych: Mood is stable, no new changes  All other systems were reviewed with the patient and are negative.  PHYSICAL EXAMINATION: ECOG PERFORMANCE STATUS: 1 - Symptomatic but completely ambulatory  Vitals:   05/30/20 1256  BP: (!) 122/93  Pulse: 96  Resp: 18  Temp: 98.1 F (36.7 C)  SpO2: 96%   Filed Weights   05/30/20 1256  Weight: 267 lb 9.6 oz (121.4 kg)    GENERAL:alert, no distress and comfortable SKIN: skin color, texture, turgor are normal, no rashes or significant lesions EYES: normal,  conjunctiva are pink and non-injected, sclera clear OROPHARYNX: Noted well-healed changes in his tongue consistent with prior surgery.  Noted some swelling on the submandibular region with palpable nodule in the submental region NECK: Neck has changes consistent with prior radiation induced fibrosis LYMPH:  no palpable lymphadenopathy in the cervical, axillary or inguinal LUNGS: clear to auscultation and percussion with normal breathing effort HEART: regular rate & rhythm and no murmurs and no lower extremity edema ABDOMEN:abdomen soft, non-tender and normal bowel sounds Musculoskeletal:no cyanosis of digits and no clubbing  PSYCH: alert & oriented x 3 with fluent speech NEURO: no focal motor/sensory deficits  LABORATORY DATA:  I have reviewed the data as listed Lab Results  Component Value Date   WBC 6.7 07/29/2019   HGB 15.1 07/29/2019   HCT 44.8 07/29/2019   MCV 84.5 07/29/2019   PLT 290 07/29/2019   No results for input(s): NA, K, CL, CO2, GLUCOSE, BUN, CREATININE, CALCIUM, GFRNONAA, GFRAA, PROT, ALBUMIN, AST, ALT, ALKPHOS, BILITOT, BILIDIR, IBILI in the last 8760 hours.  RADIOGRAPHIC STUDIES: I have personally reviewed the radiological images as listed and agreed with the findings in the report. CT SOFT TISSUE NECK W CONTRAST  Result Date: 05/02/2020 CLINICAL DATA:  Numbness in the lower lip and gum. Burning in the mouth. Personal history of tongue cancer status post radical neck dissection and glossectomy. Radiation therapy. EXAM: CT NECK WITH CONTRAST TECHNIQUE: Multidetector CT imaging of the neck was performed using the standard protocol following the bolus administration of intravenous contrast. CONTRAST:  50m ISOVUE-300 IOPAMIDOL (ISOVUE-300) INJECTION 61% COMPARISON:  CT neck with contrast 08/15/2014 and 09/06/2013 FINDINGS: Pharynx and larynx: A peripherally enhancing mass is present at the anterior tongue base measuring 4.3 x 2.7 x 2.7 cm. There is focal sclerosis of the  anterior genu of the mandible. Or posterior tongue is within normal limits. Nasopharynx and soft palate are normal. Vallecula and epiglottis are within normal limits. Aryepiglottic folds and piriform sinuses are clear. Vocal cords are midline and symmetric. Trachea is clear. Salivary glands: Parotid glands are within  normal limits bilaterally. Submandibular glands are resected. Thyroid: Normal Lymph nodes: No significant right-sided recurrent lymph nodes are present. An 8 mm peripherally enhancing left level 2 lymph node present near the tail of the parotid gland just anterior to the retromandibular vein on image 30 of series 100. No other significant recurrent left-sided lymph nodes are present. Vascular: Post radiation changes are noted in the carotid sheath bilaterally. No significant stenosis is present. Limited intracranial: Within normal limits. Mastoids and visualized paranasal sinuses: Polyps or mucous retention cysts are noted within the maxillary sinuses bilaterally. Mastoid air cells are clear. Skeleton: Vertebral body heights and alignment are normal. No focal lytic or blastic lesions are present in the cervical spine. Sclerotic changes are present in the genu of the mandible. Upper chest: Post radiation changes are noted at the lung apices bilaterally. Lung apices are otherwise clear. Thoracic inlet is within normal limits. Other: IMPRESSION: 1. 4.3 x 2.7 x 2.7 cm peripherally enhancing mass at the anterior tongue base with focal sclerosis of the anterior genu of the mandible. 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 2. 8 mm peripherally enhancing left level 2 lymph node is concerning for metastatic disease. Recommend PET scan of the neck to further characterize disease. 3. No other significant recurrent left-sided lymph nodes. 4. Post radiation changes of the carotid sheath bilaterally. 5. Post radiation changes at the lung apices bilaterally.  6. Polyps or mucous retention cysts within the maxillary sinuses bilaterally. These results will be called to the ordering clinician or representative by the Radiologist Assistant, and communication documented in the PACS or [employer]. Electronically Signed   By: San Morelle M.D.   On: 05/02/2020 21:22   [place] CORE BIOPSY (SOFT TISSUE)  Result Date: 05/24/2020 INDICATION: History of tongue cancer, previous surgery and radiation, submental mass by neck CT EXAM: ULTRASOUND MIDLINE SUBMENTAL MASS 18 GAUGE CORE BIOPSY MEDICATIONS: 1% LIDOCAINE LOCAL ANESTHESIA/SEDATION: Moderate (conscious) sedation was employed during this procedure. A total of Versed 1.0 mg and Fentanyl 50 mcg was administered intravenously. Moderate Sedation Time: 10 minutes. The patient's level of consciousness and vital signs were monitored continuously by radiology nursing throughout the procedure under my direct supervision. FLUOROSCOPY TIME:  Fluoroscopy Time: NONE. COMPLICATIONS: None immediate. PROCEDURE: Informed written consent was obtained from the patient after a thorough discussion of the procedural risks, benefits and alternatives. All questions were addressed. Maximal Sterile Barrier Technique was utilized including caps, mask, sterile gowns, sterile gloves, sterile drape, hand hygiene and skin antiseptic. A timeout was performed prior to the initiation of the procedure. Previous imaging reviewed. Preliminary ultrasound performed. A hypoechoic lobulated solid submental mass was localized and marked. Under sterile conditions and local anesthesia, an 18 gauge core biopsy was advanced to the lesion. Needle position confirmed with ultrasound. 3 1 cm 18 gauge core biopsies obtained under direct ultrasound. Images obtained for documentation. Samples were intact and non fragmented. These were placed in saline. Postprocedure imaging demonstrates no hemorrhage or hematoma. Patient tolerated biopsy well. IMPRESSION: Successful  ultrasound submental mass 18 gauge core biopsy Electronically Signed   By: [name].  Shick M.D.   On: 05/24/2020 15:26

## 2020-05-30 NOTE — Telephone Encounter (Signed)
Spoke pt regarding his upcoming appt with MD Alvy Bimler on 06/08/20. Pt is aware of date and time.

## 2020-05-30 NOTE — Assessment & Plan Note (Signed)
I have reviewed his recent CT imaging and gave patient copies of his biopsy report I am concerned given the aggressive nature of the recurrent disease with high Ki-67 and sarcomatoid features, which is drastically different from his previous well differentiated squamous cell carcinoma I will order a PET CT scan for staging The patient is scheduled to see ENT surgeon at [facility] next week for possible aggressive surgery followed by reconstruction I am very concerned about poor wound healing given his irradiated tissue in his head and neck region I would defer to the ENT surgeon to discuss the risk and benefits of surgery and reconstruction If the complication risks are high, and if the patient decides against surgery, I will offer him palliative chemotherapy

## 2020-05-30 NOTE — Telephone Encounter (Signed)
-----   Message from Heath Lark, MD sent at 05/30/2020 10:58 AM EDT ----- Regarding: RE: Biopsy results I just saw it I can see him today or tomorrow at 1 pm, 60 mins as a new patient ----- Message ----- From: Veverly Fells, RN Sent: 05/30/2020  10:56 AM EDT To: Heath Lark, MD Subject: Biopsy results                                 This patient called and left a VM stating that he had tonsil cancer 8 years ago and has recently been diagnosed with cancer again. He was wondering if you had seen his biopsy results.   Thanks.

## 2020-06-07 ENCOUNTER — Ambulatory Visit (HOSPITAL_COMMUNITY)
Admission: RE | Admit: 2020-06-07 | Discharge: 2020-06-07 | Disposition: A | Discharge status: Home / Self Care | Payer: BC Managed Care – PPO | Source: Ambulatory Visit | Attending: Hematology and Oncology | Admitting: Hematology and Oncology

## 2020-06-07 ENCOUNTER — Other Ambulatory Visit: Payer: Self-pay

## 2020-06-07 DIAGNOSIS — C029 Malignant neoplasm of tongue, unspecified: Secondary | ICD-10-CM | POA: Diagnosis present

## 2020-06-07 LAB — GLUCOSE, CAPILLARY: Glucose-Capillary: 104 mg/dL — ABNORMAL HIGH (ref 70–99)

## 2020-06-07 MED ORDER — FLUDEOXYGLUCOSE F - 18 (FDG) INJECTION
12.0000 | Freq: Once | INTRAVENOUS | Status: AC | PRN
  Administered 2020-06-07: 13.2 via INTRAVENOUS

## 2020-06-08 ENCOUNTER — Encounter: Payer: Self-pay | Admitting: *Deleted

## 2020-06-08 ENCOUNTER — Telehealth: Payer: Self-pay

## 2020-06-08 ENCOUNTER — Inpatient Hospital Stay (HOSPITAL_BASED_OUTPATIENT_CLINIC_OR_DEPARTMENT_OTHER): Payer: BC Managed Care – PPO | Admitting: Hematology and Oncology

## 2020-06-08 ENCOUNTER — Other Ambulatory Visit (HOSPITAL_COMMUNITY): Payer: Self-pay

## 2020-06-08 ENCOUNTER — Encounter: Payer: Self-pay | Admitting: Hematology and Oncology

## 2020-06-08 VITALS — BP 135/95 | HR 111 | Temp 99.2°F | Resp 18 | Ht 74.0 in | Wt 261.4 lb

## 2020-06-08 DIAGNOSIS — E669 Obesity, unspecified: Secondary | ICD-10-CM

## 2020-06-08 DIAGNOSIS — C029 Malignant neoplasm of tongue, unspecified: Secondary | ICD-10-CM

## 2020-06-08 DIAGNOSIS — Z7189 Other specified counseling: Secondary | ICD-10-CM

## 2020-06-08 DIAGNOSIS — G893 Neoplasm related pain (acute) (chronic): Secondary | ICD-10-CM | POA: Diagnosis not present

## 2020-06-08 DIAGNOSIS — C7801 Secondary malignant neoplasm of right lung: Secondary | ICD-10-CM

## 2020-06-08 DIAGNOSIS — C7802 Secondary malignant neoplasm of left lung: Secondary | ICD-10-CM

## 2020-06-08 DIAGNOSIS — Z5112 Encounter for antineoplastic immunotherapy: Secondary | ICD-10-CM | POA: Diagnosis not present

## 2020-06-08 MED ORDER — PROCHLORPERAZINE MALEATE 10 MG PO TABS
10.0000 mg | ORAL_TABLET | Freq: Four times a day (QID) | ORAL | 1 refills | Status: DC | PRN
  Filled 2020-06-08 – 2020-06-19 (×2): qty 30, 8d supply, fill #0

## 2020-06-08 MED ORDER — LIDOCAINE-PRILOCAINE 2.5-2.5 % EX CREA
TOPICAL_CREAM | CUTANEOUS | 3 refills | Status: DC
  Filled 2020-06-08 – 2020-06-19 (×2): qty 30, 30d supply, fill #0

## 2020-06-08 MED ORDER — ONDANSETRON HCL 8 MG PO TABS
8.0000 mg | ORAL_TABLET | Freq: Three times a day (TID) | ORAL | 1 refills | Status: DC | PRN
  Filled 2020-06-08 – 2020-06-19 (×2): qty 30, 10d supply, fill #0

## 2020-06-08 NOTE — Progress Notes (Signed)
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

## 2020-06-08 NOTE — Research (Signed)
Trial: [id number]: A RANDOMIZED TRIAL ADDRESSING CANCER-RELATED FINANCIAL HARDSHIP THROUGH DELIVERY OF A PROACTIVE FINANCIAL NAVIGATION INTERVENTION (CREDIT) Patient [name] was identified by Dr. Alvy Bimler as a potential candidate for the above listed study.  This Clinical Research Nurse met with [name], [id number], on 06/08/20 in a manner and location that ensures patient privacy to discuss participation in the above listed research study.  Patient is Accompanied by his wife.  A copy of the informed consent document and separate HIPAA Authorization was provided to the patient and wife.  Patient and wife both read, speak, and understand [place].   Patient and wife were provided with the business card of this Nurse and encouraged to contact the research team with any questions.  Approximately 5 minutes were spent with the patient and wife reviewing the study.  They were provided the option of taking informed consent documents home to review and was encouraged to review at their convenience with their support network, including other care providers. Patient took the consent documents home to review.  Also included copies of study questionnaires to take home. Instructed them not to complete but to review so they will know what type of questions will be asked if they enroll on this study. They verbalized understanding.  Foye Spurling, BSN, RN Clinical Research Nurse 06/08/2020 3:04 PM

## 2020-06-08 NOTE — Telephone Encounter (Signed)
RN spoke with [facility] pathology.  PD-L1 added.

## 2020-06-08 NOTE — Telephone Encounter (Signed)
-----  Message from Heath Lark, MD sent at 06/08/2020 10:55 AM EDT ----- CASE: [id number]   Can you get pathologist to add PD-L1 testing to his biopsy?

## 2020-06-08 NOTE — Assessment & Plan Note (Addendum)
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 It is a category 1 recommendation from NCCN. We discussed the role of treatment is of palliative intent  Lancet. 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  Methods [id number] was a randomised, phase 3 study of participants with untreated locally incurable recurrent or metastatic HNSCC done at 200 sites in 37 countries. Participants were stratified by PD-L1 expression, p16 status, and performance status and randomly allocated (1:1:1) to pembrolizumab alone, pembrolizumab plus a platinum and 5-fluorouracil (pembrolizumab with chemotherapy), or cetuximab plus a platinum and 5-fluorouracil (cetuximab with chemotherapy). Investigators and participants were aware of treatment assignment. Investigators, participants, and representatives of the sponsor were masked to the PD-L1 combined positive score (CPS)  results; PD-L1 positivity was not required for study entry. The primary endpoints were overall survival (time from randomisation to death from any cause) and progression-free survival (time from randomisation to radiographically confirmed disease progression or death from any cause, whichever came first) in the intention-to-treat population (all participants randomly allocated to a treatment group). There were 14 primary hypotheses: superiority of pembrolizumab alone and of pembrolizumab with chemotherapy versus cetuximab with chemotherapy for overall survival and progression-free survival in the PD-L1 CPS of 20 or more, CPS of 1 or more, and total populations and non-inferiority (non-inferiority margin: 12) of pembrolizumab alone and pembrolizumab with chemotherapy versus cetuximab with chemotherapy for overall survival in the total population. The definitive findings for each hypothesis were obtained when statistical testing was completed for that hypothesis; this occurred at the second interim analysis for 11 hypotheses and at final analysis for three hypotheses. Safety was assessed in the as-treated population (all participants who received at least one dose of allocated treatment). This study is registered at [email or web address], number [id number].  Findings Between May 17, 2013, and Feb 14, 2015, 882 participants were allocated to receive pembrolizumab alone (n=301), pembrolizumab with chemotherapy (n=281), or cetuximab with chemotherapy (n=300); of these, 754 (85%) had CPS of 1 or more and 381 (43%) had CPS of 20 or more. At the second interim analysis, pembrolizumab alone improved overall survival versus cetuximab with chemotherapy in the CPS of 20 or more population (median 149 months vs 107 months, hazard ratio [HR] 061 [95% CI 045-083], p=00007) and CPS of 1 or more population (123 vs 103, 078 [064-096], p=00086) and was non-inferior in the total population (116 vs 107, 085  [071-103]). Pembrolizumab with chemotherapy improved overall survival versus cetuximab with chemotherapy in the total population (130 months vs 107 months, HR  077 [95% CI 063-093], p=00034) at the second interim analysis and in the CPS of 20 or more population (147 vs 110, 060 [045-082], p=00004) and CPS of 1 or more population (136 vs 104, 065 [053-080], p<00001) at final analysis. Neither pembrolizumab alone nor pembrolizumab with chemotherapy improved progression-free survival at the second interim analysis. At final analysis, grade 3 or worse all-cause adverse events occurred in 164 (55%) of 300 treated participants in the pembrolizumab alone group, 235 (85%) of 276 in the pembrolizumab with chemotherapy group, and 239 (83%) of 287 in the cetuximab with chemotherapy group. Adverse events led to death in 26 (8%) participants in the pembrolizumab alone group, 32 (12%) in the pembrolizumab with chemotherapy group, and 28 (10%) in the cetuximab with chemotherapy group.  Interpretation Based on the observed efficacy and safety, pembrolizumab plus platinum and 5-fluorouracil is an appropriate first-line treatment for recurrent or metastatic HNSCC and pembrolizumab monotherapy is an appropriate first-line treatment for PD-L1-positive recurrent or metastatic HNSCC.  We discussed some of the risks, benefits, side-effects of carboplatin, 5FU and pembrolizumab  Some of the short term side-effects included, though not limited to, including weight loss, life threatening infections, thyroid disorder, risk of allergic reactions, need for transfusions of blood products, nausea, vomiting, mouth sores, change in bowel habits, loss of hair, admission to hospital for various reasons, and risks of death.   Long term side-effects are also discussed including risks of infertility, permanent damage to nerve function, hearing loss, chronic fatigue, kidney damage with possibility needing hemodialysis, and  rare secondary malignancy including bone marrow disorders.  The patient is aware that the response rates discussed earlier is not guaranteed.  After a long discussion, patient made an informed decision to proceed with the prescribed plan of care.   Patient education material was dispensed. I will schedule port placement I am hopeful we can get his treatment started in 2 weeks I will see him the week after treatment for supportive care and toxicity review We also discussed participation in research study for [phone] CD clinical trial and he appears interested I will get my research nurse to talk to him

## 2020-06-09 DIAGNOSIS — E669 Obesity, unspecified: Secondary | ICD-10-CM | POA: Insufficient documentation

## 2020-06-09 DIAGNOSIS — Z7189 Other specified counseling: Secondary | ICD-10-CM | POA: Insufficient documentation

## 2020-06-09 DIAGNOSIS — C7800 Secondary malignant neoplasm of unspecified lung: Secondary | ICD-10-CM | POA: Insufficient documentation

## 2020-06-09 NOTE — Progress Notes (Signed)
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 with the patient and his wife I am concerned about his pulmonary nodules I suspect he might have early metastatic disease to his lungs He has significant locally advanced disease in his base of tongue area in the front of his mandible I do not recommend upfront surgical resection due to high risk of morbidity; during the recovery period of aggressive surgery, we will not be able to administer chemotherapy in his metastatic disease might get worse  I recommend consideration for upfront chemotherapy; there is a possibility he might have complete response to treatment and may not need surgery down the road  The decision was made based on publication at the [facility]. It is a category 1 recommendation from NCCN. We discussed the role of treatment is of palliative intent  Lancet. 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  Methods [id number] was a randomised, phase 3 study of participants with untreated locally incurable recurrent or metastatic HNSCC done at 200 sites in 37 countries. Participants were stratified by PD-L1 expression, p16 status, and  performance status and randomly allocated (1:1:1) to pembrolizumab alone, pembrolizumab plus a platinum and 5-fluorouracil (pembrolizumab with chemotherapy), or cetuximab plus a platinum and 5-fluorouracil (cetuximab with chemotherapy). Investigators and participants were aware of treatment assignment. Investigators, participants, and representatives of the sponsor were masked to the PD-L1 combined positive score (CPS) results; PD-L1 positivity was not required for study entry. The primary endpoints were overall survival (time from randomisation to death from any cause) and progression-free survival (time from randomisation to radiographically confirmed disease progression or death from any cause, whichever came first) in the intention-to-treat population (all participants randomly allocated to a treatment group). There were 14 primary hypotheses: superiority of pembrolizumab alone and of pembrolizumab with chemotherapy versus cetuximab with chemotherapy for overall survival and progression-free survival in the PD-L1 CPS of 20 or more, CPS of 1 or more, and total populations and non-inferiority (non-inferiority margin: 12) of pembrolizumab alone and pembrolizumab with chemotherapy versus cetuximab with chemotherapy for overall survival in the total population. The definitive findings for each hypothesis were obtained when statistical testing was completed for that hypothesis; this occurred at the second interim analysis for 11 hypotheses and at final analysis for three hypotheses. Safety was assessed in the as-treated population (all participants who received at least one dose of allocated treatment). This study is registered at [email or web address], number [id number].  Findings Between May 17, 2013, and Feb 14, 2015, 882 participants were allocated to receive pembrolizumab alone (n=301), pembrolizumab with chemotherapy (n=281), or cetuximab with chemotherapy (n=300); of these, 754 (85%) had CPS of 1 or more  and 381 (43%) had CPS of 20 or more. At the second interim analysis, pembrolizumab alone improved overall survival versus cetuximab with chemotherapy in the CPS of 20 or more population (  median 149 months vs 107 months, hazard ratio [HR] 061 [95% CI 045-083], p=00007) and CPS of 1 or more population (123 vs 103, 078 [064-096], p=00086) and was non-inferior in the total population (116 vs 107, 085 [071-103]). Pembrolizumab with chemotherapy improved overall survival versus cetuximab with chemotherapy in the total population (130 months vs 107 months, HR 077 [95% CI 063-093], p=00034) at the second interim analysis and in the CPS of 20 or more population (147 vs 110, 060 [045-082], p=00004) and CPS of 1 or more population (136 vs 104, 065 [053-080], p<00001) at final analysis. Neither pembrolizumab alone nor pembrolizumab with chemotherapy improved progression-free survival at the second interim analysis. At final analysis, grade 3 or worse all-cause adverse events occurred in 164 (55%) of 300 treated participants in the pembrolizumab alone group, 235 (85%) of 276 in the pembrolizumab with chemotherapy group, and 239 (83%) of 287 in the cetuximab with chemotherapy group. Adverse events led to death in 27 (8%) participants in the pembrolizumab alone group, 32 (12%) in the pembrolizumab with chemotherapy group, and 28 (10%) in the cetuximab with chemotherapy group.  Interpretation Based on the observed efficacy and safety, pembrolizumab plus platinum and 5-fluorouracil is an appropriate first-line treatment for recurrent or metastatic HNSCC and pembrolizumab monotherapy is an appropriate first-line treatment for PD-L1-positive recurrent or metastatic HNSCC.  We discussed some of the risks, benefits, side-effects of carboplatin, 5FU and pembrolizumab  Some of the short term side-effects included, though not limited to, including weight loss, life threatening infections, thyroid  disorder, risk of allergic reactions, need for transfusions of blood products, nausea, vomiting, mouth sores, change in bowel habits, loss of hair, admission to hospital for various reasons, and risks of death.   Long term side-effects are also discussed including risks of infertility, permanent damage to nerve function, hearing loss, chronic fatigue, kidney damage with possibility needing hemodialysis, and rare secondary malignancy including bone marrow disorders.  The patient is aware that the response rates discussed earlier is not guaranteed.  After a long discussion, patient made an informed decision to proceed with the prescribed plan of care.   Patient education material was dispensed. I will schedule port placement I am hopeful we can get his treatment started in 2 weeks I will see him the week after treatment for supportive care and toxicity review We also discussed participation in research study for [phone] CD clinical trial and he appears interested I will get my research nurse to talk to him  Cancer associated pain His pain control is stable with morphine sulfate We discussed narcotic refill policy  Metastasis to lung [facility]) He is not symptomatic However, due to PET avid lung lesions, I suspect he has metastatic disease We discussed the importance of upfront chemotherapy because of this  Obesity, Class I, BMI 30-34.9 The calculated dose of chemotherapy came back very high For 5-FU, I will use adjusted body weight For carboplatin, I will keep carboplatin at maximum dose of 750 mg to reduce the risk of severe pancytopenia  Goals of care, counseling/discussion We had extensive goals of care discussion He is aware, with stage IV disease, treatment goal is palliative However, given his young age, I am optimistic with aggressive treatment, we can still render him disease-free I recommend minimum 3 cycles of treatment before repeat imaging study I recommend for him to consider  taking time off work due to high risk of infection and other complications during treatment   Orders Placed This Encounter  Procedures  . IR  IMAGING GUIDED PORT INSERTION    Standing Status:   Future    Standing Expiration Date:   06/08/2021    Order Specific Question:   Reason for Exam (SYMPTOM  OR DIAGNOSIS REQUIRED)    Answer:   need port for chemo to start 5/23    Order Specific Question:   Preferred Imaging Location?    Answer:   [facility]  . CBC with Differential ([place] Only)    Standing Status:   Standing    Number of Occurrences:   20    Standing Expiration Date:   06/08/2021  . CMP ([place] only)    Standing Status:   Standing    Number of Occurrences:   20    Standing Expiration Date:   06/08/2021  . T4    Standing Status:   Standing    Number of Occurrences:   20    Standing Expiration Date:   06/08/2021  . TSH    Standing Status:   Standing    Number of Occurrences:   20    Standing Expiration Date:   06/08/2021    All questions were answered. The patient knows to call the clinic with any problems, questions or concerns. The total time spent in the appointment was 55 minutes encounter with patients including review of chart and various tests results, discussions about plan of care and coordination of care plan   Heath Lark, MD 06/09/2020 10:48 AM  INTERVAL HISTORY: Please see below for problem oriented charting. He returns with his wife to review test results He is tearful His pain is slightly better controlled since last time I saw him  SUMMARY OF ONCOLOGIC HISTORY: Oncology History  Tongue cancer ([place])  08/22/2011 Surgery   He had partial glossectomy which showed invasive SCC measured 1.3 cm   09/06/2011 Surgery   He underwent right neck dissection which showed 1/24 LN involved and repeat resection of tongue was negative   03/23/2012 Surgery   Repeat tngue resection showed well differentiated SCC 1.5 cm which invaded into the muscle    04/17/2012 Imaging   PET/CT scan showed New adenopathy in the left neck, compatible with recurrent malignancy   04/23/2012 Surgery   he underwent left neck LN dissection and 2/34 LN were positve   06/01/2012 - 07/13/2012 Chemotherapy   Patient had 3 cycles of cisplatin   06/12/2012 - 07/14/2012 Radiation Therapy   Patient completed RT   12/10/2012 Imaging   Interval resolution of previous hypermetabolic cervical adenopathy. There is nonspecific asymmetric increased uptake within the right side of tongue   03/13/2013 Imaging   PET/CT scan showed no evidence of disease recurrence. He was noted to have incidental kidney stones   08/15/2014 Imaging   CT scan of the dissection no evidence of disease   05/02/2020 Imaging   CT scan of neck 1. 4.3 x 2.7 x 2.7 cm peripherally enhancing mass at the anterior tongue base with focal sclerosis of the anterior genu of the mandible. 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 2. 8 mm peripherally enhancing left level 2 lymph node is concerning for metastatic disease. Recommend PET scan of the neck to further characterize disease. 3. No other significant recurrent left-sided lymph nodes. 4. Post radiation changes of the carotid sheath bilaterally. 5. Post radiation changes at the lung apices bilaterally. 6. Polyps or mucous retention cysts within the maxillary sinuses bilaterally.   05/24/2020 Pathology Results  A. SUBMENTAL MASS, MIDLINE, NEEDLE CORE BIOPSY:  - Poorly differentiated sarcomatoid malignancy.   COMMENT:   CK7 is positive.  CKAE1/AE3, CK8/18, CK20, p40, CK5/6, S-100, Melan-A and Desmin are negative.  SMA demonstrates nonspecific staining.  Ki-67 proliferation index is high.  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     05/24/2020 Procedure   Successful ultrasound submental mass 18 gauge core biopsy   06/07/2020 PET scan    IMPRESSION: 1. Unfortunately evidence of squamous cell carcinoma recurrence in the floor of the mouth. Broad lesion with intense peripheral metabolic activity in the anterior RIGHT floor the mouth. 2. Bilateral hypermetabolic nodules within the sub submandibular which are new from PET-CT 2015. Concern for unusual metastasis to the submandibular glands. 3. New small bilateral small pulmonary nodules with faint radiotracer activity. These are also concerning for metastasis. 4. Asymmetric hypermetabolic activity within the RIGHT testicle. Favor benign inflammation; however consider testicular ultrasound if concern for unusual pattern of metastasis.   06/19/2020 -  Chemotherapy    Patient is on Treatment Plan: HEAD/NECK PEMBROLIZUMAB + CARBOPLATIN + 5FU Q21D X 6 CYCLES / PEMBROLIZUMAB Q21D        REVIEW OF SYSTEMS:   Constitutional: Denies fevers, chills or abnormal weight loss Eyes: Denies blurriness of vision Ears, nose, mouth, throat, and face: Denies mucositis or sore throat Respiratory: Denies cough, dyspnea or wheezes Cardiovascular: Denies palpitation, chest discomfort or lower extremity swelling Gastrointestinal:  Denies nausea, heartburn or change in bowel habits Skin: Denies abnormal skin rashes Lymphatics: Denies new lymphadenopathy or easy bruising Neurological:Denies numbness, tingling or new weaknesses Behavioral/Psych: Mood is stable, no new changes  All other systems were reviewed with the patient and are negative.  I have reviewed the past medical history, past surgical history, social history and family history with the patient and they are unchanged from previous note.  ALLERGIES:  has No Known Allergies.  MEDICATIONS:  Current Outpatient Medications  Medication Sig Dispense Refill  . lidocaine-prilocaine (EMLA) cream Apply to affected area once as directed 30 g 3  . loratadine (CLARITIN) 10 MG tablet Take 10 mg by mouth daily as needed for allergies.    Marland Kitchen morphine  (MSIR) 15 MG tablet Take 1 tablet (15 mg total) by mouth every 4 (four) hours as needed for severe pain. 30 tablet 0  . ondansetron (ZOFRAN) 8 MG tablet Take 1 tablet (8 mg total) by mouth every 8 (eight) hours as needed for refractory nausea / vomiting. 30 tablet 1  . prochlorperazine (COMPAZINE) 10 MG tablet Take 1 tablet (10 mg total) by mouth every 6 (six) hours as needed (Nausea or vomiting). 30 tablet 1   No current facility-administered medications for this visit.    PHYSICAL EXAMINATION: ECOG PERFORMANCE STATUS: 1 - Symptomatic but completely ambulatory  Vitals:   06/08/20 1358  BP: (!) 135/95  Pulse: (!) 111  Resp: 18  Temp: 99.2 F (37.3 C)  SpO2: 100%   Filed Weights   06/08/20 1358  Weight: 261 lb 6.4 oz (118.6 kg)    GENERAL:alert, no distress and comfortable NEURO: alert & oriented x 3 with fluent speech, no focal motor/sensory deficits  LABORATORY DATA:  I have reviewed the data as listed    Component Value Date/Time   NA 140 05/02/2017 0901   NA 141 04/29/2016 1402   K 4.3 05/02/2017 0901   K 4.1 04/29/2016 1402   CL 109 05/02/2017 0901   CL 100 07/10/2012 1341  CO2 24 05/02/2017 0901   CO2 25 04/29/2016 1402   GLUCOSE 99 05/02/2017 0901   GLUCOSE 91 04/29/2016 1402   GLUCOSE 118 (H) 07/10/2012 1341   BUN 11 05/02/2017 0901   BUN 9.7 04/29/2016 1402   CREATININE 0.98 05/02/2017 0901   CREATININE 1.0 04/29/2016 1402   CALCIUM 9.1 05/02/2017 0901   CALCIUM 9.3 04/29/2016 1402   PROT 7.1 05/02/2017 0901   PROT 7.0 04/29/2016 1402   ALBUMIN 4.1 05/02/2017 0901   ALBUMIN 4.1 04/29/2016 1402   AST 25 05/02/2017 0901   AST 28 04/29/2016 1402   ALT 28 05/02/2017 0901   ALT 26 04/29/2016 1402   ALKPHOS 76 05/02/2017 0901   ALKPHOS 71 04/29/2016 1402   BILITOT 0.5 05/02/2017 0901   BILITOT 0.43 04/29/2016 1402   GFRNONAA >60 05/02/2017 0901   GFRAA >60 05/02/2017 0901    No results found for: SPEP, UPEP  Lab Results  Component Value Date    WBC 6.7 07/29/2019   NEUTROABS 5,059 05/13/2019   HGB 15.1 07/29/2019   HCT 44.8 07/29/2019   MCV 84.5 07/29/2019   PLT 290 07/29/2019      Chemistry      Component Value Date/Time   NA 140 05/02/2017 0901   NA 141 04/29/2016 1402   K 4.3 05/02/2017 0901   K 4.1 04/29/2016 1402   CL 109 05/02/2017 0901   CL 100 07/10/2012 1341   CO2 24 05/02/2017 0901   CO2 25 04/29/2016 1402   BUN 11 05/02/2017 0901   BUN 9.7 04/29/2016 1402   CREATININE 0.98 05/02/2017 0901   CREATININE 1.0 04/29/2016 1402      Component Value Date/Time   CALCIUM 9.1 05/02/2017 0901   CALCIUM 9.3 04/29/2016 1402   ALKPHOS 76 05/02/2017 0901   ALKPHOS 71 04/29/2016 1402   AST 25 05/02/2017 0901   AST 28 04/29/2016 1402   ALT 28 05/02/2017 0901   ALT 26 04/29/2016 1402   BILITOT 0.5 05/02/2017 0901   BILITOT 0.43 04/29/2016 1402       RADIOGRAPHIC STUDIES: I have reviewed multiple imaging studies with the patient and his wife I have personally reviewed the radiological images as listed and agreed with the findings in the report. NM PET Image Restage (PS) Skull Base to Thigh  Result Date: 06/07/2020 CLINICAL DATA:  Subsequent treatment strategy for head neck carcinoma. Lingual carcinoma. Initial diagnosis 2013. Patient status post radical neck dissection and in the glossectomy. EXAM: NUCLEAR MEDICINE PET SKULL BASE TO THIGH TECHNIQUE: 13.2 mCi F-18 FDG was injected intravenously. Full-ring PET imaging was performed from the skull base to thigh after the radiotracer. CT data was obtained and used for attenuation correction and anatomic localization. Fasting blood glucose: 104 mg/dl COMPARISON:  Neck CT 05/02/2020 PET-CT 03/13/2013 FINDINGS: Mediastinal blood pool activity: SUV max 4.2 Liver activity: SUV max 4.1 NECK: Rim of intense hypermetabolic activity within the anterior RIGHT floor the mouth with SUV max equal 9.5. This lesion is relatively large extends from the midline floor the mouth to the mandible  measuring 5.7 x 4.9 cm (image 33). Hypermetabolic nodule within the LEFT submandibular gland measuring 11 mm (image 24) with SUV max equal 3.7. This is new from PET-CT scan of 2015. Additionally nodule within the medial aspect of the LEFT submandibular gland measuring 8 mm on image 32/series 4 with SUV max equal 5.5. Similar nodule in the periphery of the RIGHT submandibular gland measuring 7 mm image 25. Incidental CT findings: none  CHEST: In the superior aspect of the RIGHT upper lobe 5 mm nodule (23/series 8) is not seen on comparison PET-CT 2015. Nodule within the medial aspect of the RIGHT middle lobe measuring 8 mm ( image 36) is also new. Rounded nodule in the central LEFT upper lobe measures 6 mm (image 28). These nodules have faint radiotracer activity (SUV max equal 2.1 in the RIGHT middle lobe). Incidental CT findings: none ABDOMEN/PELVIS: No abnormal hypermetabolic activity within the liver, pancreas, adrenal glands, or spleen. No hypermetabolic lymph nodes in the abdomen or pelvis. Intense activity through the stomach likely represents gastritis. Intense activity within the RIGHT testicle with SUV max equal 7.4. Testicle is normal by CT imaging. Incidental CT findings: none SKELETON: No focal hypermetabolic activity to suggest skeletal metastasis. Incidental CT findings: none IMPRESSION: 1. Unfortunately evidence of squamous cell carcinoma recurrence in the floor of the mouth. Broad lesion with intense peripheral metabolic activity in the anterior RIGHT floor the mouth. 2. Bilateral hypermetabolic nodules within the sub submandibular which are new from PET-CT 2015. Concern for unusual metastasis to the submandibular glands. 3. New small bilateral small pulmonary nodules with faint radiotracer activity. These are also concerning for metastasis. 4. Asymmetric hypermetabolic activity within the RIGHT testicle. Favor benign inflammation; however consider testicular ultrasound if concern for unusual pattern  of metastasis. Electronically Signed   By: Suzy Bouchard M.D.   On: 06/07/2020 16:23   [place] CORE BIOPSY (SOFT TISSUE)  Result Date: 05/24/2020 INDICATION: History of tongue cancer, previous surgery and radiation, submental mass by neck CT EXAM: ULTRASOUND MIDLINE SUBMENTAL MASS 18 GAUGE CORE BIOPSY MEDICATIONS: 1% LIDOCAINE LOCAL ANESTHESIA/SEDATION: Moderate (conscious) sedation was employed during this procedure. A total of Versed 1.0 mg and Fentanyl 50 mcg was administered intravenously. Moderate Sedation Time: 10 minutes. The patient's level of consciousness and vital signs were monitored continuously by radiology nursing throughout the procedure under my direct supervision. FLUOROSCOPY TIME:  Fluoroscopy Time: NONE. COMPLICATIONS: None immediate. PROCEDURE: Informed written consent was obtained from the patient after a thorough discussion of the procedural risks, benefits and alternatives. All questions were addressed. Maximal Sterile Barrier Technique was utilized including caps, mask, sterile gowns, sterile gloves, sterile drape, hand hygiene and skin antiseptic. A timeout was performed prior to the initiation of the procedure. Previous imaging reviewed. Preliminary ultrasound performed. A hypoechoic lobulated solid submental mass was localized and marked. Under sterile conditions and local anesthesia, an 18 gauge core biopsy was advanced to the lesion. Needle position confirmed with ultrasound. 3 1 cm 18 gauge core biopsies obtained under direct ultrasound. Images obtained for documentation. Samples were intact and non fragmented. These were placed in saline. Postprocedure imaging demonstrates no hemorrhage or hematoma. Patient tolerated biopsy well. IMPRESSION: Successful ultrasound submental mass 18 gauge core biopsy Electronically Signed   By: [name].  Shick M.D.   On: 05/24/2020 15:26

## 2020-06-09 NOTE — Assessment & Plan Note (Signed)
His pain control is stable with morphine sulfate We discussed narcotic refill policy

## 2020-06-09 NOTE — Assessment & Plan Note (Signed)
We had extensive goals of care discussion He is aware, with stage IV disease, treatment goal is palliative However, given his young age, I am optimistic with aggressive treatment, we can still render him disease-free I recommend minimum 3 cycles of treatment before repeat imaging study I recommend for him to consider taking time off work due to high risk of infection and other complications during treatment

## 2020-06-09 NOTE — Assessment & Plan Note (Signed)
The calculated dose of chemotherapy came back very high For 5-FU, I will use adjusted body weight For carboplatin, I will keep carboplatin at maximum dose of 750 mg to reduce the risk of severe pancytopenia

## 2020-06-09 NOTE — Assessment & Plan Note (Signed)
He is not symptomatic However, due to PET avid lung lesions, I suspect he has metastatic disease We discussed the importance of upfront chemotherapy because of this

## 2020-06-13 ENCOUNTER — Telehealth: Payer: Self-pay

## 2020-06-13 ENCOUNTER — Telehealth: Payer: Self-pay | Admitting: Hematology and Oncology

## 2020-06-13 ENCOUNTER — Other Ambulatory Visit: Payer: Self-pay | Admitting: Hematology and Oncology

## 2020-06-13 MED ORDER — MORPHINE SULFATE 15 MG PO TABS: 15.0000 mg | ORAL_TABLET | ORAL | 0 refills | Status: DC | PRN

## 2020-06-13 NOTE — Progress Notes (Signed)
RN submitted PA for Morphine Sulfate 15mg .  Approval received.   Approval faxed successfully to pharmacy at [phone].

## 2020-06-13 NOTE — Telephone Encounter (Signed)
Scheduled per 5/12 sch msg. Called and spoke with pt confirmed 5/23, 5/27, and 5/31 appts

## 2020-06-13 NOTE — Telephone Encounter (Signed)
-----   Message from Heath Lark, MD sent at 06/13/2020  9:33 AM EDT ----- Regarding: RE: Medication Refill I sent 60 tabs this time to his local pharmacy If they don't stock it let me know and I will send it to [facility] ----- Message ----- From: Rennis Harding, RN Sent: 06/13/2020   9:07 AM EDT To: Heath Lark, MD Subject: Medication Refill                              Patient requesting refill on MSIR 15mg  tablet.   Last filled on 5/3 as 5 day supply.

## 2020-06-13 NOTE — Telephone Encounter (Signed)
Patient notified, no further needs.  

## 2020-06-14 ENCOUNTER — Other Ambulatory Visit: Payer: Self-pay | Admitting: Student

## 2020-06-15 ENCOUNTER — Ambulatory Visit (HOSPITAL_COMMUNITY)
Admission: RE | Admit: 2020-06-15 | Discharge: 2020-06-15 | Disposition: A | Discharge status: Home / Self Care | Payer: BC Managed Care – PPO | Source: Ambulatory Visit | Attending: Hematology and Oncology | Admitting: Hematology and Oncology

## 2020-06-15 ENCOUNTER — Encounter (HOSPITAL_COMMUNITY): Payer: Self-pay

## 2020-06-15 ENCOUNTER — Other Ambulatory Visit: Payer: Self-pay | Admitting: Student

## 2020-06-15 ENCOUNTER — Other Ambulatory Visit: Payer: Self-pay

## 2020-06-15 ENCOUNTER — Other Ambulatory Visit: Payer: Self-pay | Admitting: Hematology and Oncology

## 2020-06-15 DIAGNOSIS — E669 Obesity, unspecified: Secondary | ICD-10-CM | POA: Insufficient documentation

## 2020-06-15 DIAGNOSIS — Z87891 Personal history of nicotine dependence: Secondary | ICD-10-CM | POA: Insufficient documentation

## 2020-06-15 DIAGNOSIS — Z79899 Other long term (current) drug therapy: Secondary | ICD-10-CM | POA: Insufficient documentation

## 2020-06-15 DIAGNOSIS — E039 Hypothyroidism, unspecified: Secondary | ICD-10-CM | POA: Diagnosis not present

## 2020-06-15 DIAGNOSIS — Z809 Family history of malignant neoplasm, unspecified: Secondary | ICD-10-CM | POA: Diagnosis not present

## 2020-06-15 DIAGNOSIS — C029 Malignant neoplasm of tongue, unspecified: Secondary | ICD-10-CM | POA: Insufficient documentation

## 2020-06-15 HISTORY — PX: IR IMAGING GUIDED PORT INSERTION: IMG5740

## 2020-06-15 LAB — CBC WITH DIFFERENTIAL/PLATELET
Abs Immature Granulocytes: 0.03 10*3/uL (ref 0.00–0.07)
Basophils Absolute: 0.1 10*3/uL (ref 0.0–0.1)
Basophils Relative: 1 %
Eosinophils Absolute: 0.3 10*3/uL (ref 0.0–0.5)
Eosinophils Relative: 4 %
HCT: 40.3 % (ref 39.0–52.0)
Hemoglobin: 12.8 g/dL — ABNORMAL LOW (ref 13.0–17.0)
Immature Granulocytes: 0 %
Lymphocytes Relative: 5 %
Lymphs Abs: 0.4 10*3/uL — ABNORMAL LOW (ref 0.7–4.0)
MCH: 25.7 pg — ABNORMAL LOW (ref 26.0–34.0)
MCHC: 31.8 g/dL (ref 30.0–36.0)
MCV: 80.9 fL (ref 80.0–100.0)
Monocytes Absolute: 0.8 10*3/uL (ref 0.1–1.0)
Monocytes Relative: 11 %
Neutro Abs: 6.3 10*3/uL (ref 1.7–7.7)
Neutrophils Relative %: 79 %
Platelets: 321 10*3/uL (ref 150–400)
RBC: 4.98 MIL/uL (ref 4.22–5.81)
RDW: 13.4 % (ref 11.5–15.5)
WBC: 7.8 10*3/uL (ref 4.0–10.5)
nRBC: 0 % (ref 0.0–0.2)

## 2020-06-15 LAB — COMPREHENSIVE METABOLIC PANEL
ALT: 23 U/L (ref 0–44)
AST: 23 U/L (ref 15–41)
Albumin: 3.9 g/dL (ref 3.5–5.0)
Alkaline Phosphatase: 78 U/L (ref 38–126)
Anion gap: 7 (ref 5–15)
BUN: 16 mg/dL (ref 6–20)
CO2: 25 mmol/L (ref 22–32)
Calcium: 8.8 mg/dL — ABNORMAL LOW (ref 8.9–10.3)
Chloride: 104 mmol/L (ref 98–111)
Creatinine, Ser: 1.05 mg/dL (ref 0.61–1.24)
GFR, Estimated: >60 mL/min (ref >60)
Glucose, Bld: 100 mg/dL — ABNORMAL HIGH (ref 70–99)
Potassium: 3.8 mmol/L (ref 3.5–5.1)
Sodium: 136 mmol/L (ref 135–145)
Total Bilirubin: 0.9 mg/dL (ref 0.3–1.2)
Total Protein: 7.1 g/dL (ref 6.5–8.1)

## 2020-06-15 MED ORDER — FENTANYL CITRATE (PF) 100 MCG/2ML IJ SOLN
INTRAMUSCULAR | Status: AC | PRN
  Administered 2020-06-15 (×2): 50 ug via INTRAVENOUS

## 2020-06-15 MED ORDER — FENTANYL CITRATE (PF) 100 MCG/2ML IJ SOLN
INTRAMUSCULAR | Status: AC
  Filled 2020-06-15: qty 2

## 2020-06-15 MED ORDER — HEPARIN SOD (PORK) LOCK FLUSH 100 UNIT/ML IV SOLN
INTRAVENOUS | Status: AC
  Filled 2020-06-15: qty 5

## 2020-06-15 MED ORDER — LIDOCAINE-EPINEPHRINE 1 %-1:100000 IJ SOLN
INTRAMUSCULAR | Status: AC
  Filled 2020-06-15: qty 1

## 2020-06-15 MED ORDER — MIDAZOLAM HCL 2 MG/2ML IJ SOLN
INTRAMUSCULAR | Status: AC
  Filled 2020-06-15: qty 2

## 2020-06-15 MED ORDER — MIDAZOLAM HCL 2 MG/2ML IJ SOLN
INTRAMUSCULAR | Status: AC | PRN
  Administered 2020-06-15 (×4): 1 mg via INTRAVENOUS

## 2020-06-15 MED ORDER — SODIUM CHLORIDE 0.9 % IV SOLN: INTRAVENOUS | Status: DC

## 2020-06-15 NOTE — Procedures (Signed)
Interventional Radiology Procedure Note  Procedure: Chest port  Indication: Tongue ca  Findings: Please refer to procedural dictation for full description.  Complications: None  EBL: < 10 mL  Miachel Roux, MD [phone]

## 2020-06-15 NOTE — H&P (Signed)
Chief Complaint: Patient was seen in consultation today for tongue cancer/Port-a-cath placement.  Referring Physician(s): Heath Lark (oncology)  Supervising Physician: Mir, Sharen Heck  Patient Status: [facility] - Out-pt  History of Present Illness: [name] is a 40 y.o. male with a past medical history of bradycardia, hypertriglyceridemia, tinnitus, GERD, tongue cancer, nephrolithiasis, hypothyroidism, obesity, and gout. He was unfortunately diagnosed with SCC of tongue in 2013. His cancer is currently managed by Dr. Alvy Bimler. He has undergone many surgeries for this- partial glossectomy 08/22/2011, right neck dissection/tongue dissection 09/06/2011, repeat tongue dissection 03/23/2012, and left neck lymph node dissection 04/23/2012. In addition, he has completed a round of systemic chemotherapy (05/2012 to 06/2012) along with radiation therapy (05/2012 to 06/2012). Unfortunately, patient still with evidence of SCC of the floor of the mouth based on recent imaging. He has tentative plans to begin systemic chemotherapy as management.  IR consulted by Dr. Alvy Bimler for possible image-guided Port-a-cath placement. Patient awake and alert laying in bed with no complaints at this time. Denies fever, chills, chest pain, dyspnea, abdominal pain, or headache.   Past Medical History:  Diagnosis Date  . Bradycardia 03/16/2014  . Dry mouth    radiation  . GERD (gastroesophageal reflux disease)    occ  . Gout   . Head and neck cancer ([place])    tongue cancer  . High triglycerides   . History of kidney stones   . History of radiation therapy 06/02/12- 07/14/12   oral tongue and bilateral neck, 60 gray in 30 fx  . Hypothyroidism    radiation- not working  . PONV (postoperative nausea and vomiting)    tongue swelled-emergency trach  . Ringing in ears   . Staph skin infection    Elbow- resolved.    . Stones in the urinary tract   . Tongue cancer ([place])   . Xerostomia 11/30/2012    Past Surgical History:   Procedure Laterality Date  . COLONOSCOPY N/A 06/16/2019   Procedure: COLONOSCOPY;  Surgeon: Daneil Dolin, MD;  Location: [facility];  Service: Endoscopy;  Laterality: N/A;  1:15pm  . CYSTOSCOPY WITH RETROGRADE PYELOGRAM, URETEROSCOPY AND STENT PLACEMENT Left 04/16/2013   Procedure: CYSTOSCOPY WITH RETROGRADE PYELOGRAM, URETEROSCOPY AND STENT PLACEMENT;  Surgeon: Molli Hazard, MD;  Location: [facility];  Service: Urology;  Laterality: Left;  . FISTULOTOMY N/A 08/05/2019   Procedure: ANAL EXAM UNDER ANESTHESIA, FISTULOTOMY;  Surgeon: Leighton Ruff, MD;  Location: [facility];  Service: General;  Laterality: N/A;  . GASTROSTOMY W/ FEEDING TUBE  2/14  . GLOSSECTOMY    . GLOSSECTOMY  08/22/2011   Procedure: GLOSSECTOMY;  Surgeon: [name], MD;  Location: [place];  Service: ENT;  Laterality: Right;  WITH FROZEN SECTION  . GLOSSECTOMY  09/06/2011   Procedure: GLOSSECTOMY;  Surgeon: [name], MD;  Location: [facility] OR;  Service: ENT;  Laterality: Right;  Right Tongue Re-Excision  . HEMIGLOSSECTOMY N/A 03/23/2012   Procedure: BIOPSY OF TONGUE WITH FROZEN SECTION AND RIGHT HEMIGLOSSECTOMY;  Surgeon: [name], MD;  Location: [place];  Service: ENT;  Laterality: N/A;  . HOLMIUM LASER APPLICATION Left 8/41/6606   Procedure: HOLMIUM LASER APPLICATION;  Surgeon: Molli Hazard, MD;  Location: [facility];  Service: Urology;  Laterality: Left;  . MOUTH SURGERY     biopsy- (08/06/2011 Dr. Hoyt Koch), also had tissue graft (oral) 2010  . RADICAL NECK DISSECTION  09/06/2011   Procedure: RADICAL NECK DISSECTION;  Surgeon: [name], MD;  Location: [place];  Service: ENT;  Laterality: Right;  Right Modified Neck Dissection   . RADICAL NECK DISSECTION Left 04/23/2012   Procedure: LEFT MODIFIED  NECK DISSECTION;  Surgeon: [name], MD;  Location: [place];  Service: ENT;  Laterality: Left;  Left Supra Omo Hyoid Neck Dissection   . TONGUE BIOPSY    . TRACHEOSTOMY CLOSURE     14  . TRACHEOSTOMY TUBE PLACEMENT N/A  03/23/2012   Procedure: TRACHEOSTOMY;  Surgeon: [name], MD;  Location: [place];  Service: ENT;  Laterality: N/A;    Allergies: Patient has no known allergies.  Medications: Prior to Admission medications   Medication Sig Start Date End Date Taking? Authorizing Provider  lidocaine-prilocaine (EMLA) cream Apply to affected area once as directed 06/08/20   Heath Lark, MD  loratadine (CLARITIN) 10 MG tablet Take 10 mg by mouth daily as needed for allergies.    [provider]  morphine (MSIR) 15 MG tablet Take 1 tablet (15 mg total) by mouth every 4 (four) hours as needed for severe pain. 06/13/20   Heath Lark, MD  ondansetron (ZOFRAN) 8 MG tablet Take 1 tablet (8 mg total) by mouth every 8 (eight) hours as needed for refractory nausea / vomiting. 06/08/20   Heath Lark, MD  prochlorperazine (COMPAZINE) 10 MG tablet Take 1 tablet (10 mg total) by mouth every 6 (six) hours as needed (Nausea or vomiting). 06/08/20   Heath Lark, MD     Family History  Problem Relation Age of Onset  . Rheum arthritis Mother   . Asthma Father   . Cancer Maternal Grandmother   . Diabetes Sister   . Diabetes Brother   . Colon cancer Neg Hx     Social History   Socioeconomic History  . Marital status: Married    Spouse name: Not on file  . Number of children: 3  . Years of education: Not on file  . Highest education level: Not on file  Occupational History  . Occupation: Music therapist: [employer]    Comment: AC unit.   Tobacco Use  . Smoking status: Former Smoker    Years: 15.00    Types: Cigars    Quit date: 08/14/2011    Years since quitting: 8.8  . Smokeless tobacco: Never Used  Vaping Use  . Vaping Use: Never used  Substance and Sexual Activity  . Alcohol use: No    Comment: quit drinking 7/13 from 12 pack of beer on weekends  . Drug use: No  . Sexual activity: Yes  Other Topics Concern  . Not on file  Social History Narrative   Married.   History of smoking 5  small cigars daily but quit in 2013.   History of drinking approximately a 12 pack of beer on weekends but quit in July 2013.         Social Determinants of Health   Financial Resource Strain: Not on file  Food Insecurity: Not on file  Transportation Needs: Not on file  Physical Activity: Not on file  Stress: Not on file  Social Connections: Not on file     Review of Systems: A 12 point ROS discussed and pertinent positives are indicated in the HPI above.  All other systems are negative.  Review of Systems  Constitutional: Negative for chills and fever.  Respiratory: Negative for shortness of breath and wheezing.   Cardiovascular: Negative for chest pain and palpitations.  Gastrointestinal: Negative for abdominal pain.  Neurological: Negative for headaches.  Psychiatric/Behavioral: Negative  for behavioral problems and confusion.    Vital Signs: BP (!) 150/91   Pulse 92   Temp 97.9 F (36.6 C)   SpO2 97%   Physical Exam Vitals and nursing note reviewed.  Constitutional:      General: He is not in acute distress. Cardiovascular:     Rate and Rhythm: Normal rate and regular rhythm.     Heart sounds: Normal heart sounds. No murmur heard.   Pulmonary:     Effort: Pulmonary effort is normal. No respiratory distress.     Breath sounds: Normal breath sounds. No wheezing.  Skin:    General: Skin is warm and dry.  Neurological:     Mental Status: He is alert and oriented to person, place, and time.      MD Evaluation Airway: WNL Heart: WNL Abdomen: WNL Chest/ Lungs: WNL ASA  Classification: 3 Mallampati/Airway Score: Two   Imaging: NM PET Image Restage (PS) Skull Base to Thigh  Result Date: 06/07/2020 CLINICAL DATA:  Subsequent treatment strategy for head neck carcinoma. Lingual carcinoma. Initial diagnosis 2013. Patient status post radical neck dissection and in the glossectomy. EXAM: NUCLEAR MEDICINE PET SKULL BASE TO THIGH TECHNIQUE: 13.2 mCi F-18 FDG was  injected intravenously. Full-ring PET imaging was performed from the skull base to thigh after the radiotracer. CT data was obtained and used for attenuation correction and anatomic localization. Fasting blood glucose: 104 mg/dl COMPARISON:  Neck CT 05/02/2020 PET-CT 03/13/2013 FINDINGS: Mediastinal blood pool activity: SUV max 4.2 Liver activity: SUV max 4.1 NECK: Rim of intense hypermetabolic activity within the anterior RIGHT floor the mouth with SUV max equal 9.5. This lesion is relatively large extends from the midline floor the mouth to the mandible measuring 5.7 x 4.9 cm (image 33). Hypermetabolic nodule within the LEFT submandibular gland measuring 11 mm (image 24) with SUV max equal 3.7. This is new from PET-CT scan of 2015. Additionally nodule within the medial aspect of the LEFT submandibular gland measuring 8 mm on image 32/series 4 with SUV max equal 5.5. Similar nodule in the periphery of the RIGHT submandibular gland measuring 7 mm image 25. Incidental CT findings: none CHEST: In the superior aspect of the RIGHT upper lobe 5 mm nodule (23/series 8) is not seen on comparison PET-CT 2015. Nodule within the medial aspect of the RIGHT middle lobe measuring 8 mm ( image 36) is also new. Rounded nodule in the central LEFT upper lobe measures 6 mm (image 28). These nodules have faint radiotracer activity (SUV max equal 2.1 in the RIGHT middle lobe). Incidental CT findings: none ABDOMEN/PELVIS: No abnormal hypermetabolic activity within the liver, pancreas, adrenal glands, or spleen. No hypermetabolic lymph nodes in the abdomen or pelvis. Intense activity through the stomach likely represents gastritis. Intense activity within the RIGHT testicle with SUV max equal 7.4. Testicle is normal by CT imaging. Incidental CT findings: none SKELETON: No focal hypermetabolic activity to suggest skeletal metastasis. Incidental CT findings: none IMPRESSION: 1. 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 Broad lesion with intense peripheral metabolic activity in the anterior RIGHT floor the mouth. 2. Bilateral hypermetabolic nodules within the sub submandibular which are new from PET-CT 2015. Concern for unusual metastasis to the submandibular glands. 3. New small bilateral small pulmonary nodules with faint radiotracer activity. These are also concerning for metastasis. 4. Asymmetric hypermetabolic activity within the RIGHT testicle. Favor benign inflammation; however consider testicular ultrasound if concern for unusual pattern of metastasis. Electronically Signed  By: Suzy Bouchard M.D.   On: 06/07/2020 16:23   [place] CORE BIOPSY (SOFT TISSUE)  Result Date: 05/24/2020 INDICATION: History of tongue cancer, previous surgery and radiation, submental mass by neck CT EXAM: ULTRASOUND MIDLINE SUBMENTAL MASS 18 GAUGE CORE BIOPSY MEDICATIONS: 1% LIDOCAINE LOCAL ANESTHESIA/SEDATION: Moderate (conscious) sedation was employed during this procedure. A total of Versed 1.0 mg and Fentanyl 50 mcg was administered intravenously. Moderate Sedation Time: 10 minutes. The patient's level of consciousness and vital signs were monitored continuously by radiology nursing throughout the procedure under my direct supervision. FLUOROSCOPY TIME:  Fluoroscopy Time: NONE. COMPLICATIONS: None immediate. PROCEDURE: Informed written consent was obtained from the patient after a thorough discussion of the procedural risks, benefits and alternatives. All questions were addressed. Maximal Sterile Barrier Technique was utilized including caps, mask, sterile gowns, sterile gloves, sterile drape, hand hygiene and skin antiseptic. A timeout was performed prior to the initiation of the procedure. Previous imaging reviewed. Preliminary ultrasound performed. A hypoechoic lobulated solid submental mass was localized and marked. Under sterile conditions and local anesthesia, an 18 gauge core biopsy was advanced to the  lesion. Needle position confirmed with ultrasound. 3 1 cm 18 gauge core biopsies obtained under direct ultrasound. Images obtained for documentation. Samples were intact and non fragmented. These were placed in saline. Postprocedure imaging demonstrates no hemorrhage or hematoma. Patient tolerated biopsy well. IMPRESSION: Successful ultrasound submental mass 18 gauge core biopsy Electronically Signed   By: [name].  Shick M.D.   On: 05/24/2020 15:26    Labs:  CBC: Recent Labs    07/29/19 0907  WBC 6.7  HGB 15.1  HCT 44.8  PLT 290    COAGS: No results for input(s): INR, APTT in the last 8760 hours.  BMP: No results for input(s): NA, K, CL, CO2, GLUCOSE, BUN, CALCIUM, CREATININE, GFRNONAA, GFRAA in the last 8760 hours.  Invalid input(s): CMP  LIVER FUNCTION TESTS: No results for input(s): BILITOT, AST, ALT, ALKPHOS, PROT, ALBUMIN in the last 8760 hours.  TUMOR MARKERS: No results for input(s): AFPTM, CEA, CA199, CHROMGRNA in the last 8760 hours.  Assessment and Plan:  Recurrent tongue cancer (SCC) s/p multiple surgeries/systemic chemotherapy/radiation therapy with tentative plans for systemic chemotherapy as management. Plan for image-guided Port-a-cath placement today in IR. Patient is NPO. Afebrile.  Risks and benefits of image-guided Port-a-catheter placement were discussed with the patient including, but not limited to bleeding, infection, pneumothorax, or fibrin sheath development and need for additional procedures. All of the patient's questions were answered, patient is agreeable to proceed. Consent signed and in chart.   Thank you for this interesting consult.  I greatly enjoyed meeting [name] and look forward to participating in their care.  A copy of this report was sent to the requesting provider on this date.  Electronically Signed: Earley Abide, PA-C 06/15/2020, 1:25 PM   I spent a total of 15 Minutes in face to face in clinical consultation, greater than  50% of which was counseling/coordinating care for tongue cancer/Port-a-cath placement.

## 2020-06-15 NOTE — Discharge Instructions (Addendum)
Please call Interventional Radiology clinic [phone] with any questions or concerns.  You may remove your dressing and shower tomorrow.  DO NOT use EMLA cream on your port site for 2 weeks as this cream will remove surgical glue on your incision.  Implanted Port Insertion, Care After This sheet gives you information about how to care for yourself after your procedure. Your health care provider may also give you more specific instructions. If you have problems or questions, contact your health care provider. What can I expect after the procedure? After the procedure, it is common to have:  Discomfort at the port insertion site.  Bruising on the skin over the port. This should improve over 3-4 days. Follow these instructions at home: [facility] care  After your port is placed, you will get a manufacturer's information card. The card has information about your port. Keep this card with you at all times.  Take care of the port as told by your health care provider. Ask your health care provider if you or a family member can get training for taking care of the port at home. A home health care nurse may also take care of the port.  Make sure to remember what type of port you have. Incision care 1. Follow instructions from your health care provider about how to take care of your port insertion site. Make sure you: ? Wash your hands with soap and water before and after you change your bandage (dressing). If soap and water are not available, use hand sanitizer. ? Change your dressing as told by your health care provider. ? Leave stitches (sutures), skin glue, or adhesive strips in place. These skin closures may need to stay in place for 2 weeks or longer. If adhesive strip edges start to loosen and curl up, you may trim the loose edges. Do not remove adhesive strips completely unless your health care provider tells you to do that. 2. Check your port insertion site every day for signs of infection.  Check for: ? Redness, swelling, or pain. ? Fluid or blood. ? Warmth. ? Pus or a bad smell.        Activity  Return to your normal activities as told by your health care provider. Ask your health care provider what activities are safe for you.  Do not lift anything that is heavier than 10 lb (4.5 kg), or the limit that you are told, until your health care provider says that it is safe. General instructions  Take over-the-counter and prescription medicines only as told by your health care provider.  Do not take baths, swim, or use a hot tub until your health care provider approves. Ask your health care provider if you may take showers. You may only be allowed to take sponge baths.  Do not drive for 24 hours if you were given a sedative during your procedure.  Wear a medical alert bracelet in case of an emergency. This will tell any health care providers that you have a port.  Keep all follow-up visits as told by your health care provider. This is important. Contact a health care provider if:  You cannot flush your port with saline as directed, or you cannot draw blood from the port.  You have a fever or chills.  You have redness, swelling, or pain around your port insertion site.  You have fluid or blood coming from your port insertion site.  Your port insertion site feels warm to the touch.  You have pus  or a bad smell coming from the port insertion site. Get help right away if:  You have chest pain or shortness of breath.  You have bleeding from your port that you cannot control. Summary  Take care of the port as told by your health care provider. Keep the manufacturer's information card with you at all times.  Change your dressing as told by your health care provider.  Contact a health care provider if you have a fever or chills or if you have redness, swelling, or pain around your port insertion site.  Keep all follow-up visits as told by your health care  provider. This information is not intended to replace advice given to you by your health care provider. Make sure you discuss any questions you have with your health care provider. Document Revised: 08/12/2017 Document Reviewed: 08/12/2017 Elsevier Patient Education  2021 [place].   Moderate Conscious Sedation, Adult, Care After This sheet gives you information about how to care for yourself after your procedure. Your health care provider may also give you more specific instructions. If you have problems or questions, contact your health care provider. What can I expect after the procedure? After the procedure, it is common to have:  Sleepiness for several hours.  Impaired judgment for several hours.  Difficulty with balance.  Vomiting if you eat too soon. Follow these instructions at home: For the time period you were told by your health care provider:  Rest.  Do not participate in activities where you could fall or become injured.  Do not drive or use machinery.  Do not drink alcohol.  Do not take sleeping pills or medicines that cause drowsiness.  Do not make important decisions or sign legal documents.  Do not take care of children on your own.        Eating and drinking 3. Follow the diet recommended by your health care provider. 4. Drink enough fluid to keep your urine pale yellow. 5. If you vomit: ? Drink water, juice, or soup when you can drink without vomiting. ? Make sure you have little or no nausea before eating solid foods.    General instructions  Take over-the-counter and prescription medicines only as told by your health care provider.  Have a responsible adult stay with you for the time you are told. It is important to have someone help care for you until you are awake and alert.  Do not smoke.  Keep all follow-up visits as told by your health care provider. This is important. Contact a health care provider if:  You are still sleepy or  having trouble with balance after 24 hours.  You feel light-headed.  You keep feeling nauseous or you keep vomiting.  You develop a rash.  You have a fever.  You have redness or swelling around the IV site. Get help right away if:  You have trouble breathing.  You have new-onset confusion at home. Summary  After the procedure, it is common to feel sleepy, have impaired judgment, or feel nauseous if you eat too soon.  Rest after you get home. Know the things you should not do after the procedure.  Follow the diet recommended by your health care provider and drink enough fluid to keep your urine pale yellow.  Get help right away if you have trouble breathing or new-onset confusion at home. This information is not intended to replace advice given to you by your health care provider. Make sure you discuss any questions you  have with your health care provider. Document Revised: 05/14/2019 Document Reviewed: 12/10/2018 Elsevier Patient Education  2021 [employer].

## 2020-06-16 ENCOUNTER — Other Ambulatory Visit (HOSPITAL_COMMUNITY): Payer: Self-pay

## 2020-06-16 NOTE — Progress Notes (Signed)
Pharmacist Chemotherapy Monitoring - Initial Assessment    Anticipated start date: 06/19/20   Regimen:  . Are orders appropriate based on the patient's diagnosis, regimen, and cycle? Yes . Does the plan date match the patient's scheduled date? Yes . Is the sequencing of drugs appropriate? Yes . Are the premedications appropriate for the patient's regimen? Yes . Prior Authorization for treatment is: Approved o If applicable, is the correct biosimilar selected based on the patient's insurance? not applicable  Organ Function and Labs: Marland Kitchen Are dose adjustments needed based on the patient's renal function, hepatic function, or hematologic function? No . Are appropriate labs ordered prior to the start of patient's treatment? Yes . Other organ system assessment, if indicated: N/A . The following baseline labs, if indicated, have been ordered: pembrolizumab: baseline TSH +/- T4  Dose Assessment: . Are the drug doses appropriate? Yes -- . Note: For 5-FU, I will use adjusted body weight.  For carboplatin, I will keep carboplatin at maximum dose of 750 mg to reduce the risk of severe pancytopenia. . Are the following correct: o Drug concentrations Yes o IV fluid compatible with drug Yes o Administration routes Yes o Timing of therapy Yes . If applicable, does the patient have documented access for treatment and/or plans for port-a-cath placement? yes . If applicable, have lifetime cumulative doses been properly documented and assessed? yes Lifetime Dose Tracking  No doses have been documented on this patient for the following tracked chemicals: Doxorubicin, Epirubicin, Idarubicin, Daunorubicin, Mitoxantrone, Bleomycin, Oxaliplatin, Carboplatin, Liposomal Doxorubicin  o   Toxicity Monitoring/Prevention: . The patient has the following take home antiemetics prescribed: Ondansetron and Prochlorperazine . The patient has the following take home medications prescribed: N/A . Medication allergies and  previous infusion related reactions, if applicable, have been reviewed and addressed. Yes . The patient's current medication list has been assessed for drug-drug interactions with their chemotherapy regimen. no significant drug-drug interactions were identified on review.  Order Review: . Are the treatment plan orders signed? Yes . Is the patient scheduled to see a provider prior to their treatment? No  I verify that I have reviewed each item in the above checklist and answered each question accordingly.   Kennith Center, Pharm.D., CPP 06/16/2020@10 :08 AM

## 2020-06-19 ENCOUNTER — Inpatient Hospital Stay: Payer: BC Managed Care – PPO

## 2020-06-19 ENCOUNTER — Encounter: Payer: Self-pay | Admitting: Radiology

## 2020-06-19 ENCOUNTER — Other Ambulatory Visit (HOSPITAL_COMMUNITY): Payer: Self-pay

## 2020-06-19 ENCOUNTER — Other Ambulatory Visit: Payer: Self-pay

## 2020-06-19 VITALS — BP 117/75 | HR 88 | Temp 98.2°F | Resp 17 | Wt 253.0 lb

## 2020-06-19 DIAGNOSIS — Z5112 Encounter for antineoplastic immunotherapy: Secondary | ICD-10-CM | POA: Diagnosis not present

## 2020-06-19 DIAGNOSIS — C029 Malignant neoplasm of tongue, unspecified: Secondary | ICD-10-CM

## 2020-06-19 MED ORDER — SODIUM CHLORIDE 0.9 % IV SOLN
10.0000 mg | Freq: Once | INTRAVENOUS | Status: AC
  Administered 2020-06-19: 10 mg via INTRAVENOUS
  Filled 2020-06-19: qty 1
  Filled 2020-06-19: qty 10

## 2020-06-19 MED ORDER — PALONOSETRON HCL INJECTION 0.25 MG/5ML
INTRAVENOUS | Status: AC
  Filled 2020-06-19: qty 5

## 2020-06-19 MED ORDER — PALONOSETRON HCL INJECTION 0.25 MG/5ML
0.2500 mg | Freq: Once | INTRAVENOUS | Status: AC
  Administered 2020-06-19: 0.25 mg via INTRAVENOUS

## 2020-06-19 MED ORDER — SODIUM CHLORIDE 0.9 % IV SOLN
1000.0000 mg/m2/d | INTRAVENOUS | Status: DC
  Administered 2020-06-19: 9000 mg via INTRAVENOUS
  Filled 2020-06-19: qty 180

## 2020-06-19 MED ORDER — SODIUM CHLORIDE 0.9 % IV SOLN
150.0000 mg | Freq: Once | INTRAVENOUS | Status: AC
  Administered 2020-06-19: 150 mg via INTRAVENOUS
  Filled 2020-06-19: qty 150

## 2020-06-19 MED ORDER — SODIUM CHLORIDE 0.9 % IV SOLN
Freq: Once | INTRAVENOUS | Status: AC
  Filled 2020-06-19: qty 250

## 2020-06-19 MED ORDER — SODIUM CHLORIDE 0.9 % IV SOLN
750.0000 mg | Freq: Once | INTRAVENOUS | Status: AC
  Administered 2020-06-19: 750 mg via INTRAVENOUS
  Filled 2020-06-19: qty 75

## 2020-06-19 MED ORDER — SODIUM CHLORIDE 0.9 % IV SOLN
200.0000 mg | Freq: Once | INTRAVENOUS | Status: AC
  Administered 2020-06-19: 200 mg via INTRAVENOUS
  Filled 2020-06-19: qty 8

## 2020-06-19 NOTE — Patient Instructions (Addendum)
[facility]  Discharge Instructions: Thank you for choosing [facility] to provide your oncology and hematology care.   If you have a lab appointment with the [facility], please go directly to the [facility] and check in at the registration area.   Wear comfortable clothing and clothing appropriate for easy access to any Portacath or PICC line.   We strive to give you quality time with your provider. You may need to reschedule your appointment if you arrive late (15 or more minutes).  Arriving late affects you and other patients whose appointments are after yours.  Also, if you miss three or more appointments without notifying the office, you may be dismissed from the clinic at the provider's discretion.      For prescription refill requests, have your pharmacy contact our office and allow 72 hours for refills to be completed.    Today you received the following chemotherapy and/or immunotherapy agents Keytruda, Carboplatin, Fluorouracil  To help prevent nausea and vomiting after your treatment, we encourage you to take your nausea medication as directed.  BELOW ARE SYMPTOMS THAT SHOULD BE REPORTED IMMEDIATELY: . *FEVER GREATER THAN 100.4 F (38 C) OR HIGHER . *CHILLS OR SWEATING . *NAUSEA AND VOMITING THAT IS NOT CONTROLLED WITH YOUR NAUSEA MEDICATION . *UNUSUAL SHORTNESS OF BREATH . *UNUSUAL BRUISING OR BLEEDING . *URINARY PROBLEMS (pain or burning when urinating, or frequent urination) . *BOWEL PROBLEMS (unusual diarrhea, constipation, pain near the anus) . TENDERNESS IN MOUTH AND THROAT WITH OR WITHOUT PRESENCE OF ULCERS (sore throat, sores in mouth, or a toothache) . UNUSUAL RASH, SWELLING OR PAIN  . UNUSUAL VAGINAL DISCHARGE OR ITCHING   Items with * indicate a potential emergency and should be followed up as soon as possible or go to the Emergency Department if any problems should occur.  Please show the CHEMOTHERAPY ALERT CARD or  IMMUNOTHERAPY ALERT CARD at check-in to the Emergency Department and triage nurse.  Should you have questions after your visit or need to cancel or reschedule your appointment, please contact [facility]  Dept: [phone]  and follow the prompts.  Office hours are 8:00 a.m. to 4:30 p.m. Monday - Friday. Please note that voicemails left after 4:00 p.m. may not be returned until the following business day.  We are closed weekends and major holidays. You have access to a nurse at all times for urgent questions. Please call the main number to the clinic Dept: [phone] and follow the prompts.   For any non-urgent questions, you may also contact your provider using MyChart. We now offer e-Visits for anyone 71 and older to request care online for non-urgent symptoms. For details visit [email or web address].   Also download the MyChart app! Go to the app store, search "MyChart", open the app, select [facility], and log in with your MyChart username and password.  Due to Covid, a mask is required upon entering the hospital/clinic. If you do not have a mask, one will be given to you upon arrival. For doctor visits, patients may have 1 support person aged 9 or older with them. For treatment visits, patients cannot have anyone with them due to current Covid guidelines and our immunocompromised population.  Pembrolizumab injection What is this medicine? PEMBROLIZUMAB (pem broe liz ue mab) is a monoclonal antibody. It is used to treat certain types of cancer. This medicine may be used for other purposes; ask your health care provider or pharmacist if you  have questions. COMMON BRAND NAME(S): Keytruda What should I tell my health care provider before I take this medicine? They need to know if you have any of these conditions:  autoimmune diseases like Crohn's disease, ulcerative colitis, or lupus  have had or planning to have an allogeneic stem cell transplant (uses  someone else's stem cells)  history of organ transplant  history of chest radiation  nervous system problems like myasthenia gravis or Guillain-Barre syndrome  an unusual or allergic reaction to pembrolizumab, other medicines, foods, dyes, or preservatives  pregnant or trying to get pregnant  breast-feeding How should I use this medicine? This medicine is for infusion into a vein. It is given by a health care professional in a hospital or clinic setting. A special MedGuide will be given to you before each treatment. Be sure to read this information carefully each time. Talk to your pediatrician regarding the use of this medicine in children. While this drug may be prescribed for children as young as 6 months for selected conditions, precautions do apply. Overdosage: If you think you have taken too much of this medicine contact a poison control center or emergency room at once. NOTE: This medicine is only for you. Do not share this medicine with others. What if I miss a dose? It is important not to miss your dose. Call your doctor or health care professional if you are unable to keep an appointment. What may interact with this medicine? Interactions have not been studied. This list may not describe all possible interactions. Give your health care provider a list of all the medicines, herbs, non-prescription drugs, or dietary supplements you use. Also tell them if you smoke, drink alcohol, or use illegal drugs. Some items may interact with your medicine. What should I watch for while using this medicine? Your condition will be monitored carefully while you are receiving this medicine. You may need blood work done while you are taking this medicine. Do not become pregnant while taking this medicine or for 4 months after stopping it. Women should inform their doctor if they wish to become pregnant or think they might be pregnant. There is a potential for serious side effects to an unborn  child. Talk to your health care professional or pharmacist for more information. Do not breast-feed an infant while taking this medicine or for 4 months after the last dose. What side effects may I notice from receiving this medicine? Side effects that you should report to your doctor or health care professional as soon as possible:  allergic reactions like skin rash, itching or hives, swelling of the face, lips, or tongue  bloody or black, tarry  breathing problems  changes in vision  chest pain  chills  confusion  constipation  cough  diarrhea  dizziness or feeling faint or lightheaded  fast or irregular heartbeat  fever  flushing  joint pain  low blood counts - this medicine may decrease the number of white blood cells, red blood cells and platelets. You may be at increased risk for infections and bleeding.  muscle pain  muscle weakness  pain, tingling, numbness in the hands or feet  persistent headache  redness, blistering, peeling or loosening of the skin, including inside the mouth  signs and symptoms of high blood sugar such as dizziness; dry mouth; dry skin; fruity breath; nausea; stomach pain; increased hunger or thirst; increased urination  signs and symptoms of kidney injury like trouble passing urine or change in the amount of urine  signs and symptoms of liver injury like dark urine, light-colored stools, loss of appetite, nausea, right upper belly pain, yellowing of the eyes or skin  sweating  swollen lymph nodes  weight loss Side effects that usually do not require medical attention (report to your doctor or health care professional if they continue or are bothersome):  decreased appetite  hair loss  tiredness This list may not describe all possible side effects. Call your doctor for medical advice about side effects. You may report side effects to FDA at 1-800-FDA-1088. Where should I keep my medicine? This drug is given in a hospital  or clinic and will not be stored at home. NOTE: This sheet is a summary. It may not cover all possible information. If you have questions about this medicine, talk to your doctor, pharmacist, or health care provider.  2021 Elsevier/Gold Standard (2018-12-16 21:44:53) Carboplatin injection What is this medicine? CARBOPLATIN (KAR boe pla tin) is a chemotherapy drug. It targets fast dividing cells, like cancer cells, and causes these cells to die. This medicine is used to treat ovarian cancer and many other cancers. This medicine may be used for other purposes; ask your health care provider or pharmacist if you have questions. COMMON BRAND NAME(S): Paraplatin What should I tell my health care provider before I take this medicine? They need to know if you have any of these conditions:  blood disorders  hearing problems  kidney disease  recent or ongoing radiation therapy  an unusual or allergic reaction to carboplatin, cisplatin, other chemotherapy, other medicines, foods, dyes, or preservatives  pregnant or trying to get pregnant  breast-feeding How should I use this medicine? This drug is usually given as an infusion into a vein. It is administered in a hospital or clinic by a specially trained health care professional. Talk to your pediatrician regarding the use of this medicine in children. Special care may be needed. Overdosage: If you think you have taken too much of this medicine contact a poison control center or emergency room at once. NOTE: This medicine is only for you. Do not share this medicine with others. What if I miss a dose? It is important not to miss a dose. Call your doctor or health care professional if you are unable to keep an appointment. What may interact with this medicine?  medicines for seizures  medicines to increase blood counts like filgrastim, pegfilgrastim, sargramostim  some antibiotics like amikacin, gentamicin, neomycin, streptomycin,  tobramycin  vaccines Talk to your doctor or health care professional before taking any of these medicines:  acetaminophen  aspirin  ibuprofen  ketoprofen  naproxen This list may not describe all possible interactions. Give your health care provider a list of all the medicines, herbs, non-prescription drugs, or dietary supplements you use. Also tell them if you smoke, drink alcohol, or use illegal drugs. Some items may interact with your medicine. What should I watch for while using this medicine? Your condition will be monitored carefully while you are receiving this medicine. You will need important blood work done while you are taking this medicine. This drug may make you feel generally unwell. This is not uncommon, as chemotherapy can affect healthy cells as well as cancer cells. Report any side effects. Continue your course of treatment even though you feel ill unless your doctor tells you to stop. In some cases, you may be given additional medicines to help with side effects. Follow all directions for their use. Call your doctor or health care professional  for advice if you get a fever, chills or sore throat, or other symptoms of a cold or flu. Do not treat yourself. This drug decreases your body's ability to fight infections. Try to avoid being around people who are sick. This medicine may increase your risk to bruise or bleed. Call your doctor or health care professional if you notice any unusual bleeding. Be careful brushing and flossing your teeth or using a toothpick because you may get an infection or bleed more easily. If you have any dental work done, tell your dentist you are receiving this medicine. Avoid taking products that contain aspirin, acetaminophen, ibuprofen, naproxen, or ketoprofen unless instructed by your doctor. These medicines may hide a fever. Do not become pregnant while taking this medicine. Women should inform their doctor if they wish to become pregnant or  think they might be pregnant. There is a potential for serious side effects to an unborn child. Talk to your health care professional or pharmacist for more information. Do not breast-feed an infant while taking this medicine. What side effects may I notice from receiving this medicine? Side effects that you should report to your doctor or health care professional as soon as possible:  allergic reactions like skin rash, itching or hives, swelling of the face, lips, or tongue  signs of infection - fever or chills, cough, sore throat, pain or difficulty passing urine  signs of decreased platelets or bleeding - bruising, pinpoint red spots on the skin, black, tarry stools, nosebleeds  signs of decreased red blood cells - unusually weak or tired, fainting spells, lightheadedness  breathing problems  changes in hearing  changes in vision  chest pain  high blood pressure  low blood counts - This drug may decrease the number of white blood cells, red blood cells and platelets. You may be at increased risk for infections and bleeding.  nausea and vomiting  pain, swelling, redness or irritation at the injection site  pain, tingling, numbness in the hands or feet  problems with balance, talking, walking  trouble passing urine or change in the amount of urine Side effects that usually do not require medical attention (report to your doctor or health care professional if they continue or are bothersome):  hair loss  loss of appetite  metallic taste in the mouth or changes in taste This list may not describe all possible side effects. Call your doctor for medical advice about side effects. You may report side effects to FDA at 1-800-FDA-1088. Where should I keep my medicine? This drug is given in a hospital or clinic and will not be stored at home. NOTE: This sheet is a summary. It may not cover all possible information. If you have questions about this medicine, talk to your doctor,  pharmacist, or health care provider.  2021 Elsevier/Gold Standard (2007-04-21 14:38:05)  Fluorouracil, 5-FU injection What is this medicine? FLUOROURACIL, 5-FU (flure oh YOOR a sil) is a chemotherapy drug. It slows the growth of cancer cells. This medicine is used to treat many types of cancer like breast cancer, colon or rectal cancer, pancreatic cancer, and stomach cancer. This medicine may be used for other purposes; ask your health care provider or pharmacist if you have questions. COMMON BRAND NAME(S): Adrucil What should I tell my health care provider before I take this medicine? They need to know if you have any of these conditions:  blood disorders  dihydropyrimidine dehydrogenase (DPD) deficiency  infection (especially a virus infection such as chickenpox, cold sores,  or herpes)  kidney disease  liver disease  malnourished, poor nutrition  recent or ongoing radiation therapy  an unusual or allergic reaction to fluorouracil, other chemotherapy, other medicines, foods, dyes, or preservatives  pregnant or trying to get pregnant  breast-feeding How should I use this medicine? This drug is given as an infusion or injection into a vein. It is administered in a hospital or clinic by a specially trained health care professional. Talk to your pediatrician regarding the use of this medicine in children. Special care may be needed. Overdosage: If you think you have taken too much of this medicine contact a poison control center or emergency room at once. NOTE: This medicine is only for you. Do not share this medicine with others. What if I miss a dose? It is important not to miss your dose. Call your doctor or health care professional if you are unable to keep an appointment. What may interact with this medicine? Do not take this medicine with any of the following medications:  live virus vaccines This medicine may also interact with the following medications:  medicines  that treat or prevent blood clots like warfarin, enoxaparin, and dalteparin This list may not describe all possible interactions. Give your health care provider a list of all the medicines, herbs, non-prescription drugs, or dietary supplements you use. Also tell them if you smoke, drink alcohol, or use illegal drugs. Some items may interact with your medicine. What should I watch for while using this medicine? Visit your doctor for checks on your progress. This drug may make you feel generally unwell. This is not uncommon, as chemotherapy can affect healthy cells as well as cancer cells. Report any side effects. Continue your course of treatment even though you feel ill unless your doctor tells you to stop. In some cases, you may be given additional medicines to help with side effects. Follow all directions for their use. Call your doctor or health care professional for advice if you get a fever, chills or sore throat, or other symptoms of a cold or flu. Do not treat yourself. This drug decreases your body's ability to fight infections. Try to avoid being around people who are sick. This medicine may increase your risk to bruise or bleed. Call your doctor or health care professional if you notice any unusual bleeding. Be careful brushing and flossing your teeth or using a toothpick because you may get an infection or bleed more easily. If you have any dental work done, tell your dentist you are receiving this medicine. Avoid taking products that contain aspirin, acetaminophen, ibuprofen, naproxen, or ketoprofen unless instructed by your doctor. These medicines may hide a fever. Do not become pregnant while taking this medicine. Women should inform their doctor if they wish to become pregnant or think they might be pregnant. There is a potential for serious side effects to an unborn child. Talk to your health care professional or pharmacist for more information. Do not breast-feed an infant while taking this  medicine. Men should inform their doctor if they wish to father a child. This medicine may lower sperm counts. Do not treat diarrhea with over the counter products. Contact your doctor if you have diarrhea that lasts more than 2 days or if it is severe and watery. This medicine can make you more sensitive to the sun. Keep out of the sun. If you cannot avoid being in the sun, wear protective clothing and use sunscreen. Do not use sun lamps or tanning beds/booths.  What side effects may I notice from receiving this medicine? Side effects that you should report to your doctor or health care professional as soon as possible:  allergic reactions like skin rash, itching or hives, swelling of the face, lips, or tongue  low blood counts - this medicine may decrease the number of white blood cells, red blood cells and platelets. You may be at increased risk for infections and bleeding.  signs of infection - fever or chills, cough, sore throat, pain or difficulty passing urine  signs of decreased platelets or bleeding - bruising, pinpoint red spots on the skin, black, tarry stools, blood in the urine  signs of decreased red blood cells - unusually weak or tired, fainting spells, lightheadedness  breathing problems  changes in vision  chest pain  mouth sores  nausea and vomiting  pain, swelling, redness at site where injected  pain, tingling, numbness in the hands or feet  redness, swelling, or sores on hands or feet  stomach pain  unusual bleeding Side effects that usually do not require medical attention (report to your doctor or health care professional if they continue or are bothersome):  changes in finger or toe nails  diarrhea  dry or itchy skin  hair loss  headache  loss of appetite  sensitivity of eyes to the light  stomach upset  unusually teary eyes This list may not describe all possible side effects. Call your doctor for medical advice about side effects. You  may report side effects to FDA at 1-800-FDA-1088. Where should I keep my medicine? This drug is given in a hospital or clinic and will not be stored at home. NOTE: This sheet is a summary. It may not cover all possible information. If you have questions about this medicine, talk to your doctor, pharmacist, or health care provider.  2021 Elsevier/Gold Standard (2018-12-15 15:00:03)

## 2020-06-19 NOTE — Progress Notes (Signed)
[id number]: A RANDOMIZED TRIAL ADDRESSING CANCER-RELATED FINANCIAL HARDSHIP THROUGH DELIVERY OF A PROACTIVE FINANCIAL NAVIGATION INTERVENTION (CREDIT)  06/19/20   10:15AM  VISIT: Met with [name] in the infusion room for 5 minutes. Introduced myself as a Scientist, physiological and reason for the visit was to follow-up about interest in the above mentioned study. Patient stated he has not been able to review study documents and did not have any questions at this time. This coordinator expressed understanding and encouraged patient to call with any additional questions. Patient was thanked for his time.   Carol Ada, RT(R)(T) Clinical Research Coordinator

## 2020-06-20 ENCOUNTER — Telehealth: Payer: Self-pay | Admitting: *Deleted

## 2020-06-23 ENCOUNTER — Telehealth: Payer: Self-pay | Admitting: *Deleted

## 2020-06-23 ENCOUNTER — Inpatient Hospital Stay: Payer: BC Managed Care – PPO

## 2020-06-23 ENCOUNTER — Other Ambulatory Visit: Payer: Self-pay

## 2020-06-23 DIAGNOSIS — Z5112 Encounter for antineoplastic immunotherapy: Secondary | ICD-10-CM | POA: Diagnosis not present

## 2020-06-23 DIAGNOSIS — C029 Malignant neoplasm of tongue, unspecified: Secondary | ICD-10-CM

## 2020-06-23 MED ORDER — HEPARIN SOD (PORK) LOCK FLUSH 100 UNIT/ML IV SOLN
500.0000 [IU] | Freq: Once | INTRAVENOUS | Status: AC
  Administered 2020-06-23: 500 [IU]
  Filled 2020-06-23: qty 5

## 2020-06-23 MED ORDER — SODIUM CHLORIDE 0.9% FLUSH
10.0000 mL | Freq: Once | INTRAVENOUS | Status: AC
  Administered 2020-06-23: 10 mL
  Filled 2020-06-23: qty 10

## 2020-06-23 NOTE — Telephone Encounter (Signed)
[id number]: A RANDOMIZED TRIAL ADDRESSING CANCER-RELATED FINANCIAL HARDSHIP THROUGH DELIVERY OF A PROACTIVE FINANCIAL NAVIGATION INTERVENTION (CREDIT): Spoke with patient's wife to follow up on the study to see if they have any questions and if they are interested in participating. She says they have not had a chance to review the study information yet, but plan to review it this weekend. Wife agreed for this research nurse to meet them after patient's appt with Dr. Alvy Bimler next week on Tuesday.  Informed wife if they are interested in enrolling on study then it will take about 30 minutes for the consent process. Baseline questionnaires can be completed in clinic or taken home to complete. She verbalized understanding.Thanked her for their time and willingness to consider the study.   Foye Spurling, BSN, RN Clinical Research Nurse 06/23/2020 9:40 AM

## 2020-06-23 NOTE — Progress Notes (Signed)
Chemo f/o: Pt C/O soreness in mouth and having trouble eating but is doing ensure TID. Also having some fatigue. Otherwise no other issues.

## 2020-06-27 ENCOUNTER — Inpatient Hospital Stay (HOSPITAL_BASED_OUTPATIENT_CLINIC_OR_DEPARTMENT_OTHER): Payer: BC Managed Care – PPO | Admitting: Hematology and Oncology

## 2020-06-27 ENCOUNTER — Other Ambulatory Visit: Payer: Self-pay

## 2020-06-27 ENCOUNTER — Encounter: Payer: Self-pay | Admitting: *Deleted

## 2020-06-27 ENCOUNTER — Other Ambulatory Visit (HOSPITAL_COMMUNITY): Payer: Self-pay

## 2020-06-27 ENCOUNTER — Encounter: Payer: Self-pay | Admitting: Hematology and Oncology

## 2020-06-27 DIAGNOSIS — C029 Malignant neoplasm of tongue, unspecified: Secondary | ICD-10-CM | POA: Diagnosis not present

## 2020-06-27 DIAGNOSIS — C7802 Secondary malignant neoplasm of left lung: Secondary | ICD-10-CM

## 2020-06-27 DIAGNOSIS — C7801 Secondary malignant neoplasm of right lung: Secondary | ICD-10-CM

## 2020-06-27 DIAGNOSIS — G893 Neoplasm related pain (acute) (chronic): Secondary | ICD-10-CM

## 2020-06-27 DIAGNOSIS — B370 Candidal stomatitis: Secondary | ICD-10-CM | POA: Diagnosis not present

## 2020-06-27 DIAGNOSIS — K1231 Oral mucositis (ulcerative) due to antineoplastic therapy: Secondary | ICD-10-CM | POA: Diagnosis not present

## 2020-06-27 DIAGNOSIS — Z5112 Encounter for antineoplastic immunotherapy: Secondary | ICD-10-CM | POA: Diagnosis not present

## 2020-06-27 MED ORDER — FLUCONAZOLE 100 MG PO TABS
100.0000 mg | ORAL_TABLET | Freq: Every day | ORAL | 0 refills | Status: DC
  Filled 2020-06-27: qty 7, 7d supply, fill #0

## 2020-06-27 MED ORDER — MAGIC MOUTHWASH W/LIDOCAINE: 5.0000 mL | Freq: Four times a day (QID) | ORAL | 0 refills | Status: DC

## 2020-06-27 MED ORDER — MORPHINE SULFATE 15 MG PO TABS
15.0000 mg | ORAL_TABLET | ORAL | 0 refills | Status: DC | PRN
  Filled 2020-06-27: qty 90, 15d supply, fill #0

## 2020-06-27 MED ORDER — DIPHENHYDRAMINE HCL 12.5 MG/5ML PO LIQD
ORAL | 0 refills | Status: DC
Start: 2020-06-27 — End: 2020-08-04
  Filled 2020-06-27: qty 150, 7d supply, fill #0

## 2020-06-27 NOTE — Progress Notes (Signed)
Magic mouthwash Rx called into [facility] outpatient pharmacy.

## 2020-06-27 NOTE — Assessment & Plan Note (Signed)
I recommend Magic mouthwash for 1 week

## 2020-06-27 NOTE — Research (Signed)
[name]; Met with patient for 5 minutes to follow up on this study.  He states that he and his wife are interested in participating but unfortunately, his wife could not be here today due to work.  Patient agrees for his wife and himself to come in early before his next appointments on 07/11/20 to consent.  Reminded patient it will take about 30 minutes to review and sign consents for both he an his wife.  The baseline questionnaires can then be completed in clinic or at home.  Thanked patient for his time and encouraged him or his wife to call research nurse if any questions before next visit. He verbalized understanding. Foye Spurling, BSN, RN Clinical Research Nurse 06/27/2020 2:12 PM

## 2020-06-27 NOTE — Assessment & Plan Note (Signed)
His pain is reasonably controlled I refill his prescription of morphine sulfate I warned him about risk of constipation

## 2020-06-27 NOTE — Progress Notes (Signed)
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 with signs of mucositis, thrush and weight loss His pain is reasonably controlled with current prescription morphine sulfate I plan to reduce the dose of treatment in the next cycle In the meantime, we will provide supportive care  Cancer associated pain His pain is reasonably controlled I refill his prescription of morphine sulfate I warned him about risk of constipation  Mucositis due to antineoplastic therapy I recommend Magic mouthwash for 1 week  Thrush, oral He has oral thrush on exam I recommend a week course of fluconazole   No orders of the defined types were placed in this encounter.   All questions were answered. The patient knows to call the clinic with any problems, questions or concerns. The total time spent in the appointment was 20 minutes encounter with patients including review of chart and various tests results, discussions about plan of care and coordination of care plan   Heath Lark, MD 06/27/2020 3:29 PM  INTERVAL HISTORY: Please see below for problem oriented charting. 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   09/06/2011 Surgery   He underwent right neck dissection which showed 1/24 LN involved and repeat resection of tongue was negative   03/23/2012 Surgery   Repeat tngue resection showed well differentiated SCC 1.5 cm which invaded into the muscle   04/17/2012 Imaging   PET/CT scan showed New adenopathy in the left neck, compatible with recurrent malignancy   04/23/2012 Surgery   he underwent left neck LN dissection and 2/34 LN were positve   06/01/2012 - 07/13/2012 Chemotherapy   Patient had 3 cycles of cisplatin   06/12/2012 - 07/14/2012 Radiation Therapy   Patient completed RT   12/10/2012 Imaging   Interval resolution of previous hypermetabolic cervical adenopathy. There is nonspecific asymmetric increased uptake within the right side of tongue   03/13/2013 Imaging   PET/CT scan showed no evidence of disease recurrence. He was noted to have incidental kidney stones   08/15/2014 Imaging   CT scan of the dissection no evidence of disease   05/02/2020 Imaging   CT scan of neck 1. 4.3 x 2.7 x 2.7 cm peripherally enhancing mass at the anterior tongue base with focal sclerosis of the anterior genu of the mandible. 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 2. 8 mm peripherally enhancing left level 2 lymph node is concerning for metastatic disease. Recommend PET scan of the neck to further characterize disease. 3. No other significant recurrent left-sided lymph nodes. 4. Post radiation changes of the carotid sheath bilaterally. 5. Post radiation changes at the lung apices bilaterally. 6. Polyps or mucous  retention cysts within the maxillary sinuses bilaterally.   05/24/2020 Pathology Results   A. SUBMENTAL MASS, MIDLINE, NEEDLE CORE BIOPSY:  - Poorly differentiated sarcomatoid malignancy.   COMMENT:   CK7 is positive.   CKAE1/AE3, CK8/18, CK20, p40, CK5/6, S-100, Melan-A and Desmin are negative.  SMA demonstrates nonspecific staining.  Ki-67 proliferation index is high.  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     05/24/2020 Procedure   Successful ultrasound submental mass 18 gauge core biopsy   06/07/2020 PET scan   IMPRESSION: 1. Unfortunately evidence of squamous cell carcinoma recurrence in the floor of the mouth. Broad lesion with intense peripheral metabolic activity in the anterior RIGHT floor the mouth. 2. Bilateral hypermetabolic nodules within the sub submandibular which are new from PET-CT 2015. Concern for unusual metastasis to the submandibular glands. 3. New small bilateral small pulmonary nodules with faint radiotracer activity. These are also concerning for metastasis. 4. Asymmetric hypermetabolic activity within the RIGHT testicle. Favor benign inflammation; however consider testicular ultrasound if concern for unusual pattern of metastasis.   06/15/2020 Procedure   Successful placement of a right internal jugular approach power injectable Port-A-Cath. The catheter is ready for immediate use.     06/19/2020 -  Chemotherapy    Patient is on Treatment Plan: HEAD/NECK PEMBROLIZUMAB + CARBOPLATIN + 5FU Q21D X 6 CYCLES / PEMBROLIZUMAB Q21D        REVIEW OF SYSTEMS:   Constitutional: Denies fevers, chills  Eyes: Denies blurriness of vision Respiratory: Denies cough, dyspnea or wheezes Cardiovascular: Denies palpitation, chest discomfort or lower extremity swelling Skin: Denies abnormal skin rashes Lymphatics: Denies new lymphadenopathy or easy bruising Neurological:Denies numbness, tingling or new weaknesses Behavioral/Psych: Mood is stable, no new changes  All other systems were reviewed with the patient and are negative.  I have reviewed the past medical history, past surgical history, social history and family history with the  patient and they are unchanged from previous note.  ALLERGIES:  has No Known Allergies.  MEDICATIONS:  Current Outpatient Medications  Medication Sig Dispense Refill  . fluconazole (DIFLUCAN) 100 MG tablet Take 1 tablet (100 mg total) by mouth daily. 7 tablet 0  . lidocaine-prilocaine (EMLA) cream Apply to affected area once as directed 30 g 3  . loratadine (CLARITIN) 10 MG tablet Take 10 mg by mouth daily as needed for allergies.    . magic mouthwash (lidocaine, diphenhydrAMINE, alum & mag hydroxide) suspension Swish and spit 5 mls 4 times a day as needed 150 mL 0  . magic mouthwash w/lidocaine SOLN Take 5 mLs by mouth 4 (four) times daily. 140 mL 0  . morphine (MSIR) 15 MG tablet Take 1 tablet (15 mg total) by mouth every 4 (four) hours as needed for severe pain. 90 tablet 0  . ondansetron (ZOFRAN) 8 MG tablet Take 1 tablet (8 mg total) by mouth every 8 (eight) hours as needed for refractory nausea / vomiting. 30 tablet 1  . prochlorperazine (COMPAZINE) 10 MG tablet Take 1 tablet (10 mg total) by mouth every 6 (six) hours as needed (Nausea or vomiting). 30 tablet 1   No current facility-administered medications for this visit.    PHYSICAL EXAMINATION: ECOG PERFORMANCE STATUS: 1 - Symptomatic but completely ambulatory  Vitals:   06/27/20 1248  BP: 129/81  Pulse: 92  Resp: 18  SpO2: 100%   Filed Weights   06/27/20 1248  Weight: 242 lb 3.2 oz (109.9 kg)    GENERAL:alert, no distress  and comfortable SKIN: skin color, texture, turgor are normal, no rashes or significant lesions EYES: normal, Conjunctiva are pink and non-injected, sclera clear OROPHARYNX: Noted signs of mucositis and thrush NECK: Significant fibrosis from prior radiation.  Persistent fullness in the floor of the mouth consistent with his malignancy, unchanged compared to previous visit LYMPH:  no palpable lymphadenopathy in the cervical, axillary or inguinal LUNGS: clear to auscultation and percussion with normal  breathing effort HEART: regular rate & rhythm and no murmurs and no lower extremity edema ABDOMEN:abdomen soft, non-tender and normal bowel sounds Musculoskeletal:no cyanosis of digits and no clubbing  NEURO: alert & oriented x 3 with fluent speech, no focal motor/sensory deficits  LABORATORY DATA:  I have reviewed the data as listed    Component Value Date/Time   NA 136 06/15/2020 1320   NA 141 04/29/2016 1402   K 3.8 06/15/2020 1320   K 4.1 04/29/2016 1402   CL 104 06/15/2020 1320   CL 100 07/10/2012 1341   CO2 25 06/15/2020 1320   CO2 25 04/29/2016 1402   GLUCOSE 100 (H) 06/15/2020 1320   GLUCOSE 91 04/29/2016 1402   GLUCOSE 118 (H) 07/10/2012 1341   BUN 16 06/15/2020 1320   BUN 9.7 04/29/2016 1402   CREATININE 1.05 06/15/2020 1320   CREATININE 0.98 05/02/2017 0901   CREATININE 1.0 04/29/2016 1402   CALCIUM 8.8 (L) 06/15/2020 1320   CALCIUM 9.3 04/29/2016 1402   PROT 7.1 06/15/2020 1320   PROT 7.0 04/29/2016 1402   ALBUMIN 3.9 06/15/2020 1320   ALBUMIN 4.1 04/29/2016 1402   AST 23 06/15/2020 1320   AST 25 05/02/2017 0901   AST 28 04/29/2016 1402   ALT 23 06/15/2020 1320   ALT 28 05/02/2017 0901   ALT 26 04/29/2016 1402   ALKPHOS 78 06/15/2020 1320   ALKPHOS 71 04/29/2016 1402   BILITOT 0.9 06/15/2020 1320   BILITOT 0.5 05/02/2017 0901   BILITOT 0.43 04/29/2016 1402   GFRNONAA >60 06/15/2020 1320   GFRNONAA >60 05/02/2017 0901   GFRAA >60 05/02/2017 0901    No results found for: SPEP, UPEP  Lab Results  Component Value Date   WBC 7.8 06/15/2020   NEUTROABS 6.3 06/15/2020   HGB 12.8 (L) 06/15/2020   HCT 40.3 06/15/2020   MCV 80.9 06/15/2020   PLT 321 06/15/2020      Chemistry      Component Value Date/Time   NA 136 06/15/2020 1320   NA 141 04/29/2016 1402   K 3.8 06/15/2020 1320   K 4.1 04/29/2016 1402   CL 104 06/15/2020 1320   CL 100 07/10/2012 1341   CO2 25 06/15/2020 1320   CO2 25 04/29/2016 1402   BUN 16 06/15/2020 1320   BUN 9.7  04/29/2016 1402   CREATININE 1.05 06/15/2020 1320   CREATININE 0.98 05/02/2017 0901   CREATININE 1.0 04/29/2016 1402      Component Value Date/Time   CALCIUM 8.8 (L) 06/15/2020 1320   CALCIUM 9.3 04/29/2016 1402   ALKPHOS 78 06/15/2020 1320   ALKPHOS 71 04/29/2016 1402   AST 23 06/15/2020 1320   AST 25 05/02/2017 0901   AST 28 04/29/2016 1402   ALT 23 06/15/2020 1320   ALT 28 05/02/2017 0901   ALT 26 04/29/2016 1402   BILITOT 0.9 06/15/2020 1320   BILITOT 0.5 05/02/2017 0901   BILITOT 0.43 04/29/2016 1402

## 2020-06-27 NOTE — Assessment & Plan Note (Signed)
He has oral thrush on exam I recommend a week course of fluconazole

## 2020-06-27 NOTE — Assessment & Plan Note (Signed)
He tolerated treatment poorly with signs of mucositis, thrush and weight loss His pain is reasonably controlled with current prescription morphine sulfate I plan to reduce the dose of treatment in the next cycle In the meantime, we will provide supportive care

## 2020-06-28 ENCOUNTER — Other Ambulatory Visit (HOSPITAL_COMMUNITY): Payer: Self-pay

## 2020-06-29 ENCOUNTER — Other Ambulatory Visit (HOSPITAL_COMMUNITY): Payer: Self-pay

## 2020-06-30 ENCOUNTER — Telehealth: Payer: Self-pay

## 2020-06-30 NOTE — Telephone Encounter (Signed)
He called and left a message.  The pain is much better and tolerable. The magic mouth wash has really helped his mucositis. He will call the office back if he needs anything.

## 2020-06-30 NOTE — Telephone Encounter (Signed)
Called and left below message. Ask him to call the office back with update.

## 2020-06-30 NOTE — Telephone Encounter (Signed)
-----   Message from Heath Lark, MD sent at 06/30/2020  9:49 AM EDT ----- Can you call and check on him? How is he doing from pain and mucositis perspective?

## 2020-07-03 ENCOUNTER — Telehealth: Payer: Self-pay

## 2020-07-03 NOTE — Telephone Encounter (Signed)
He called back. Gout flare up started on Saturday. He has been off of his normal gout medication. He has tried ice and took 1 of his pills for gout. But, it is not any better. He has appt with Dr. Gerarda Fraction at 3 pm and may get a steroid injection.  He wants to make sure this is okay with Dr. Alvy Bimler.

## 2020-07-03 NOTE — Telephone Encounter (Signed)
Called and given below medication. He verbalized understanding.

## 2020-07-03 NOTE — Telephone Encounter (Signed)
Yes, either oral or innjection steroids are ok Do not go back on colchicine If injection not helping, call me and I will prescribe oral prednisone

## 2020-07-03 NOTE — Telephone Encounter (Signed)
He called and left a message to call him. He is complaining of a gout flare up of ankles and toes..  Called back and left a message asking him to call the office.

## 2020-07-06 ENCOUNTER — Telehealth: Payer: Self-pay

## 2020-07-06 ENCOUNTER — Other Ambulatory Visit: Payer: Self-pay | Admitting: Hematology and Oncology

## 2020-07-06 ENCOUNTER — Other Ambulatory Visit (HOSPITAL_COMMUNITY): Payer: Self-pay

## 2020-07-06 DIAGNOSIS — G893 Neoplasm related pain (acute) (chronic): Secondary | ICD-10-CM

## 2020-07-06 DIAGNOSIS — C029 Malignant neoplasm of tongue, unspecified: Secondary | ICD-10-CM

## 2020-07-06 MED ORDER — MORPHINE SULFATE ER 30 MG PO TBCR
30.0000 mg | EXTENDED_RELEASE_TABLET | Freq: Two times a day (BID) | ORAL | 0 refills | Status: DC
  Filled 2020-07-06: qty 30, 15d supply, fill #0

## 2020-07-06 NOTE — Telephone Encounter (Signed)
He called and left a message to call him.  Called back. He has been having increased pain the last couple of days in his chin and ear area. Taking the Morphine every 4 hours was not helping and he started taking 2 Morphine tabs every 6 hours. The pain in intermittent and he not getting a lot of relief.

## 2020-07-06 NOTE — Telephone Encounter (Signed)
Called pharmacy they are working on Rx and will call [place] when Rx ready.

## 2020-07-06 NOTE — Telephone Encounter (Signed)
I sent to [facility] Please call [facility] and make sure they have it

## 2020-07-06 NOTE — Telephone Encounter (Signed)
Called back. Dr. Alvy Bimler will send MS Contin a long acting pain medication to take BID to [facility] if he is agreeable and then he can take the Morphine prn.  He is agreeable. Warned him of side effects. He verbalized understanding.

## 2020-07-11 ENCOUNTER — Other Ambulatory Visit: Payer: Self-pay

## 2020-07-11 ENCOUNTER — Inpatient Hospital Stay: Payer: BC Managed Care – PPO | Admitting: *Deleted

## 2020-07-11 ENCOUNTER — Inpatient Hospital Stay: Payer: BC Managed Care – PPO | Attending: Hematology and Oncology

## 2020-07-11 ENCOUNTER — Inpatient Hospital Stay: Payer: BC Managed Care – PPO

## 2020-07-11 ENCOUNTER — Encounter: Payer: Self-pay | Admitting: Hematology and Oncology

## 2020-07-11 ENCOUNTER — Other Ambulatory Visit: Payer: Self-pay | Admitting: Hematology and Oncology

## 2020-07-11 ENCOUNTER — Inpatient Hospital Stay (HOSPITAL_BASED_OUTPATIENT_CLINIC_OR_DEPARTMENT_OTHER): Payer: BC Managed Care – PPO | Admitting: Hematology and Oncology

## 2020-07-11 ENCOUNTER — Other Ambulatory Visit (HOSPITAL_COMMUNITY): Payer: Self-pay

## 2020-07-11 DIAGNOSIS — Z5112 Encounter for antineoplastic immunotherapy: Secondary | ICD-10-CM | POA: Insufficient documentation

## 2020-07-11 DIAGNOSIS — M109 Gout, unspecified: Secondary | ICD-10-CM | POA: Diagnosis not present

## 2020-07-11 DIAGNOSIS — C029 Malignant neoplasm of tongue, unspecified: Secondary | ICD-10-CM

## 2020-07-11 DIAGNOSIS — Z5111 Encounter for antineoplastic chemotherapy: Secondary | ICD-10-CM | POA: Insufficient documentation

## 2020-07-11 DIAGNOSIS — G893 Neoplasm related pain (acute) (chronic): Secondary | ICD-10-CM

## 2020-07-11 DIAGNOSIS — Z79899 Other long term (current) drug therapy: Secondary | ICD-10-CM | POA: Diagnosis not present

## 2020-07-11 DIAGNOSIS — K1231 Oral mucositis (ulcerative) due to antineoplastic therapy: Secondary | ICD-10-CM

## 2020-07-11 LAB — CMP (CANCER CENTER ONLY)
ALT: 66 U/L — ABNORMAL HIGH (ref 0–44)
AST: 32 U/L (ref 15–41)
Albumin: 3.3 g/dL — ABNORMAL LOW (ref 3.5–5.0)
Alkaline Phosphatase: 124 U/L (ref 38–126)
Anion gap: 10 (ref 5–15)
BUN: 15 mg/dL (ref 6–20)
CO2: 25 mmol/L (ref 22–32)
Calcium: 9.6 mg/dL (ref 8.9–10.3)
Chloride: 99 mmol/L (ref 98–111)
Creatinine: 0.98 mg/dL (ref 0.61–1.24)
GFR, Estimated: >60 mL/min (ref >60)
Glucose, Bld: 145 mg/dL — ABNORMAL HIGH (ref 70–99)
Potassium: 4.4 mmol/L (ref 3.5–5.1)
Sodium: 134 mmol/L — ABNORMAL LOW (ref 135–145)
Total Bilirubin: 0.7 mg/dL (ref 0.3–1.2)
Total Protein: 7.6 g/dL (ref 6.5–8.1)

## 2020-07-11 LAB — CBC WITH DIFFERENTIAL (CANCER CENTER ONLY)
Abs Immature Granulocytes: 0.09 10*3/uL — ABNORMAL HIGH (ref 0.00–0.07)
Basophils Absolute: 0 10*3/uL (ref 0.0–0.1)
Basophils Relative: 0 %
Eosinophils Absolute: 0 10*3/uL (ref 0.0–0.5)
Eosinophils Relative: 0 %
HCT: 41.8 % (ref 39.0–52.0)
Hemoglobin: 13.5 g/dL (ref 13.0–17.0)
Immature Granulocytes: 1 %
Lymphocytes Relative: 5 %
Lymphs Abs: 0.6 10*3/uL — ABNORMAL LOW (ref 0.7–4.0)
MCH: 25.9 pg — ABNORMAL LOW (ref 26.0–34.0)
MCHC: 32.3 g/dL (ref 30.0–36.0)
MCV: 80.1 fL (ref 80.0–100.0)
Monocytes Absolute: 1.6 10*3/uL — ABNORMAL HIGH (ref 0.1–1.0)
Monocytes Relative: 14 %
Neutro Abs: 9 10*3/uL — ABNORMAL HIGH (ref 1.7–7.7)
Neutrophils Relative %: 80 %
Platelet Count: 249 10*3/uL (ref 150–400)
RBC: 5.22 MIL/uL (ref 4.22–5.81)
RDW: 15.1 % (ref 11.5–15.5)
WBC Count: 11.2 10*3/uL — ABNORMAL HIGH (ref 4.0–10.5)
nRBC: 0 % (ref 0.0–0.2)

## 2020-07-11 LAB — TSH: TSH: 0.433 u[IU]/mL (ref 0.320–4.118)

## 2020-07-11 MED ORDER — PREDNISONE 10 MG PO TABS
10.0000 mg | ORAL_TABLET | Freq: Every day | ORAL | 0 refills | Status: DC
  Filled 2020-07-11: qty 30, 30d supply, fill #0

## 2020-07-11 MED ORDER — MORPHINE SULFATE ER 60 MG PO TBCR
60.0000 mg | EXTENDED_RELEASE_TABLET | Freq: Three times a day (TID) | ORAL | 0 refills | Status: DC
  Filled 2020-07-11: qty 90, 30d supply, fill #0
  Filled 2020-07-11: qty 80, 27d supply, fill #0
  Filled 2020-07-11: qty 10, 3d supply, fill #0

## 2020-07-11 MED ORDER — FOSAPREPITANT DIMEGLUMINE INJECTION 150 MG
150.0000 mg | Freq: Once | INTRAVENOUS | Status: AC
  Administered 2020-07-11: 150 mg via INTRAVENOUS
  Filled 2020-07-11: qty 150

## 2020-07-11 MED ORDER — MORPHINE SULFATE 30 MG PO TABS
30.0000 mg | ORAL_TABLET | ORAL | 0 refills | Status: DC | PRN
  Filled 2020-07-11: qty 90, 15d supply, fill #0
  Filled 2020-07-11: qty 90, 30d supply, fill #0

## 2020-07-11 MED ORDER — SODIUM CHLORIDE 0.9 % IV SOLN
800.0000 mg/m2/d | INTRAVENOUS | Status: DC
  Administered 2020-07-11: 7000 mg via INTRAVENOUS
  Filled 2020-07-11: qty 140

## 2020-07-11 MED ORDER — SODIUM CHLORIDE 0.9 % IV SOLN
10.0000 mg | Freq: Once | INTRAVENOUS | Status: AC
  Administered 2020-07-11: 10 mg via INTRAVENOUS
  Filled 2020-07-11: qty 10

## 2020-07-11 MED ORDER — MAGIC MOUTHWASH W/LIDOCAINE: 5.0000 mL | Freq: Four times a day (QID) | ORAL | 0 refills | Status: DC

## 2020-07-11 MED ORDER — PALONOSETRON HCL INJECTION 0.25 MG/5ML
INTRAVENOUS | Status: AC
  Filled 2020-07-11: qty 5

## 2020-07-11 MED ORDER — SODIUM CHLORIDE 0.9 % IV SOLN
Freq: Once | INTRAVENOUS | Status: AC
Start: 2020-07-11 — End: 2020-07-11
  Filled 2020-07-11: qty 250

## 2020-07-11 MED ORDER — PALONOSETRON HCL INJECTION 0.25 MG/5ML
0.2500 mg | Freq: Once | INTRAVENOUS | Status: AC
  Administered 2020-07-11: 0.25 mg via INTRAVENOUS

## 2020-07-11 MED ORDER — SODIUM CHLORIDE 0.9% FLUSH
10.0000 mL | Freq: Once | INTRAVENOUS | Status: AC
  Administered 2020-07-11: 10 mL
  Filled 2020-07-11: qty 10

## 2020-07-11 MED ORDER — NYSTATIN 100000 UNIT/ML MT SUSP
OROMUCOSAL | 1 refills | Status: DC
  Filled 2020-07-11: qty 200, 10d supply, fill #0
  Filled 2020-07-25: qty 200, 10d supply, fill #1

## 2020-07-11 MED ORDER — SODIUM CHLORIDE 0.9 % IV SOLN
750.0000 mg | Freq: Once | INTRAVENOUS | Status: AC
  Administered 2020-07-11: 750 mg via INTRAVENOUS
  Filled 2020-07-11: qty 75

## 2020-07-11 MED ORDER — SODIUM CHLORIDE 0.9 % IV SOLN
200.0000 mg | Freq: Once | INTRAVENOUS | Status: AC
  Administered 2020-07-11: 200 mg via INTRAVENOUS
  Filled 2020-07-11: qty 8

## 2020-07-11 NOTE — Progress Notes (Signed)
Per Dr. Alvy Bimler, okay to speed up 5FU pump rate to 2.7 ml/hr over 94 hours.

## 2020-07-11 NOTE — Research (Signed)
Enrollment/Randomization: Patient and wife were enrolled on study and randomized to the Financial Navigation Arm of the study. Patient ID [id number] and wife's ID [id number]. Notified wife of study assignment and to anticipate call from financial navigators through the 2 agencies identified in the consent form. Unsure of when they will be contacted. Asked wife to call research nurse if any questions. Also reminded her to watch for my email with the link to watch the financial literacy videos. She verbalized understanding.  Foye Spurling, BSN, RN Clinical Research Nurse 07/11/2020 3:36 PM

## 2020-07-11 NOTE — Assessment & Plan Note (Signed)
He has severe, poorly controlled pain I recommend increasing MS Contin to 60 mg 3 times a day along with increasing dose of IR morphine to 30 mg as needed I warned him about risk of sedation and constipation Once his pain is better controlled, he could consider reducing the MS Contin a little bit I will call him in 2 days to assess pain control

## 2020-07-11 NOTE — Assessment & Plan Note (Signed)
This is improved but not completely gone We will refill his Magic mouthwash

## 2020-07-11 NOTE — Assessment & Plan Note (Signed)
He had profound weight loss with cycle 1 of treatment and multiple side effects including mucositis and pain that has subsided I will reduce the dose of chemotherapy based on his most current weight and reduce 5-FU We will keep carboplatin at 750 mg along with pembrolizumab same dose at 200 mg Clinically, his disease that was measurable on his tongue and submandibular region appears to be softer I believe he is responding to treatment We will continue aggressive supportive care I recommend minimum 3 cycles of treatment before repeat imaging study

## 2020-07-11 NOTE — Research (Signed)
[id number]: A RANDOMIZED TRIAL ADDRESSING CANCER-RELATED FINANCIAL HARDSHIP THROUGH DELIVERY OF A PROACTIVE FINANCIAL NAVIGATION INTERVENTION (CREDIT)   07/11/2020    12:30PM  SECOND ELIGIBILITY: This Coordinator has reviewed this patient's inclusion and exclusion criteria as a second review and confirms [name] is eligible for study participation. Patient may continue with enrollment.   Carol Ada, RT(R)(T) Clinical Research Coordinator

## 2020-07-11 NOTE — Assessment & Plan Note (Signed)
He has signs of acute gout I recommend low-dose prednisone therapy for 7 to 10 days

## 2020-07-11 NOTE — Progress Notes (Signed)
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 and multiple side effects including mucositis and pain that has subsided I will reduce the dose of chemotherapy based on his most current weight and reduce 5-FU We will keep carboplatin at 750 mg along with pembrolizumab same dose at 200 mg Clinically, his disease that was measurable on his tongue and submandibular region appears to be softer I believe he is responding to treatment We will continue aggressive supportive care I recommend minimum 3 cycles of treatment before repeat imaging study  Mucositis due to antineoplastic therapy This is improved but not completely gone We will refill his Magic mouthwash  Cancer associated pain He has severe, poorly controlled pain I recommend increasing MS Contin to 60 mg 3 times a day along with increasing dose of IR morphine to 30 mg as needed I warned him about risk of sedation and constipation Once his pain is better controlled, he could consider reducing the MS Contin a little bit I will call him in 2 days to assess pain control  Acute gout He has signs of acute gout I recommend low-dose prednisone therapy for 7 to 10 days  No orders of the defined types were placed in this encounter.   All questions were answered. The patient knows to call the clinic with any problems, questions or concerns. The total time spent in the appointment was 40 minutes encounter with patients including review of chart and various tests results, discussions about plan of care and coordination of care  plan   Heath Lark, MD 07/11/2020 12:58 PM  INTERVAL HISTORY: Please see below for problem oriented charting. 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    09/06/2011 Surgery   He underwent right neck dissection which showed 1/24 LN involved and repeat resection of tongue was negative    03/23/2012 Surgery   Repeat tngue resection showed well differentiated SCC 1.5 cm which invaded into the muscle    04/17/2012 Imaging   PET/CT scan showed New adenopathy in the left neck, compatible with recurrent malignancy    04/23/2012 Surgery   he underwent left neck LN dissection and 2/34 LN were positve    06/01/2012 - 07/13/2012 Chemotherapy   Patient had 3 cycles of cisplatin    06/12/2012 - 07/14/2012 Radiation Therapy   Patient completed RT    12/10/2012 Imaging   Interval resolution of previous hypermetabolic cervical adenopathy. There is nonspecific asymmetric increased uptake within the right side of tongue    03/13/2013 Imaging   PET/CT scan showed no evidence of disease recurrence. He was noted to have incidental kidney stones  08/15/2014 Imaging   CT scan of the dissection no evidence of disease    05/02/2020 Imaging   CT scan of neck 1. 4.3 x 2.7 x 2.7 cm peripherally enhancing mass at the anterior tongue base with focal sclerosis of the anterior genu of the mandible. 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 2. 8 mm  peripherally enhancing left level 2 lymph node is concerning for metastatic disease. Recommend PET scan of the neck to further characterize disease. 3. No other significant recurrent left-sided lymph nodes. 4. Post radiation changes of the carotid sheath bilaterally. 5. Post radiation changes at the lung apices bilaterally. 6. Polyps or mucous retention cysts within the maxillary sinuses bilaterally.   05/24/2020 Pathology Results   A. SUBMENTAL MASS, MIDLINE, NEEDLE CORE BIOPSY:  - Poorly differentiated sarcomatoid malignancy.   COMMENT:   CK7 is positive.  CKAE1/AE3, CK8/18, CK20, p40, CK5/6, S-100, Melan-A and Desmin are negative.  SMA demonstrates nonspecific staining.  Ki-67 proliferation index is high.  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     05/24/2020 Procedure   Successful ultrasound submental mass 18 gauge core biopsy   06/07/2020 PET scan   IMPRESSION: 1. Unfortunately evidence of squamous cell carcinoma recurrence in the floor of the mouth. Broad lesion with intense peripheral metabolic activity in the anterior RIGHT floor the mouth. 2. Bilateral hypermetabolic nodules within the sub submandibular which are new from PET-CT 2015. Concern for unusual metastasis to the submandibular glands. 3. New small bilateral small pulmonary nodules with faint radiotracer activity. These are also concerning for metastasis. 4. Asymmetric hypermetabolic activity within the RIGHT testicle. Favor benign inflammation; however consider testicular ultrasound if concern for unusual pattern of metastasis.   06/15/2020 Procedure   Successful placement of a right internal jugular approach power injectable Port-A-Cath. The catheter is ready for immediate use.     06/19/2020 -  Chemotherapy    Patient is on Treatment Plan: HEAD/NECK PEMBROLIZUMAB + CARBOPLATIN + 5FU Q21D X 6 CYCLES / PEMBROLIZUMAB Q21D         REVIEW OF SYSTEMS:    Constitutional: Denies fevers, chills Eyes: Denies blurriness of vision Respiratory: Denies cough, dyspnea or wheezes Cardiovascular: Denies palpitation, chest discomfort or lower extremity swelling Gastrointestinal:  Denies nausea, heartburn or change in bowel habits Skin: Denies abnormal skin rashes Lymphatics: Denies new lymphadenopathy or easy bruising Neurological:Denies numbness, tingling or new weaknesses Behavioral/Psych: Mood is stable, no new changes  All other systems were reviewed with the patient and are negative.  I have reviewed the past medical history, past surgical history, social history and family history with the patient and they are unchanged from previous note.  ALLERGIES:  has No Known Allergies.  MEDICATIONS:  Current Outpatient Medications  Medication Sig Dispense Refill   predniSONE (DELTASONE) 10 MG tablet Take 1 tablet (10 mg total) by mouth daily with breakfast. 30 tablet 0   lidocaine-prilocaine (EMLA) cream Apply to affected area once as directed 30 g 3   loratadine (CLARITIN) 10 MG tablet Take 10 mg by mouth daily as needed for allergies.     magic mouthwash (lidocaine, diphenhydrAMINE, alum & mag hydroxide) suspension Swish and spit 5 mls 4 times a day as needed 150 mL 0   magic mouthwash (nystatin, lidocaine, diphenhydrAMINE, alum & mag hydroxide) suspension Swish and spit 5 mls 4 times a day 200 mL 1   morphine (MS CONTIN) 60 MG 12 hr tablet  Take 1 tablet (60 mg total) by mouth in the morning, at noon, and at bedtime. 90 tablet 0   morphine (MSIR) 30 MG tablet Take 1 tablet (30 mg total) by mouth every 4 (four) hours as needed for severe pain. 90 tablet 0   ondansetron (ZOFRAN) 8 MG tablet Take 1 tablet (8 mg total) by mouth every 8 (eight) hours as needed for refractory nausea / vomiting. 30 tablet 1   prochlorperazine (COMPAZINE) 10 MG tablet Take 1 tablet (10 mg total) by mouth every 6 (six) hours as needed (Nausea or vomiting). 30 tablet 1   No  current facility-administered medications for this visit.   Facility-Administered Medications Ordered in Other Visits  Medication Dose Route Frequency Provider Last Rate Last Admin   CARBOplatin (PARAPLATIN) 750 mg in sodium chloride 0.9 % 250 mL chemo infusion  750 mg Intravenous Once Alvy Bimler, Domenico Achord, MD       dexamethasone (DECADRON) 10 mg in sodium chloride 0.9 % 50 mL IVPB  10 mg Intravenous Once Alvy Bimler, Lenford Beddow, MD 204 mL/hr at 07/11/20 1257 10 mg at 07/11/20 1257   fluorouracil (ADRUCIL) 7,000 mg in sodium chloride 0.9 % 110 mL chemo infusion  800 mg/m2/day (Treatment Plan Adjusted) Intravenous 4 days Heath Lark, MD       fosaprepitant (EMEND) 150 mg in sodium chloride 0.9 % 145 mL IVPB  150 mg Intravenous Once Alvy Bimler, Ailyn Gladd, MD       pembrolizumab (KEYTRUDA) 200 mg in sodium chloride 0.9 % 50 mL chemo infusion  200 mg Intravenous Once Alvy Bimler, Elsey Holts, MD        PHYSICAL EXAMINATION: ECOG PERFORMANCE STATUS: 1 - Symptomatic but completely ambulatory  Vitals:   07/11/20 1157  BP: 136/83  Pulse: 100  Resp: 18  Temp: 99 F (37.2 C)  SpO2: 100%   Filed Weights   07/11/20 1157  Weight: 228 lb 6.4 oz (103.6 kg)    GENERAL:alert, no distress and comfortable SKIN: skin color, texture, turgor are normal, no rashes or significant lesions EYES: normal, Conjunctiva are pink and non-injected, sclera clear OROPHARYNX:no exudate, no erythema and lips, buccal mucosa, and tongue normal.  No signs of active thrush or mucositis NECK: Noted significant surgical scar.  The submandibular region is less erythematous and the tumor felt a bit softer  LYMPH:  no palpable lymphadenopathy in the cervical, axillary or inguinal LUNGS: clear to auscultation and percussion with normal breathing effort HEART: regular rate & rhythm and no murmurs and no lower extremity edema ABDOMEN:abdomen soft, non-tender and normal bowel sounds Musculoskeletal:no cyanosis of digits and no clubbing  NEURO: alert & oriented x 3 with  fluent speech, no focal motor/sensory deficits  LABORATORY DATA:  I have reviewed the data as listed    Component Value Date/Time   NA 134 (L) 07/11/2020 1128   NA 141 04/29/2016 1402   K 4.4 07/11/2020 1128   K 4.1 04/29/2016 1402   CL 99 07/11/2020 1128   CL 100 07/10/2012 1341   CO2 25 07/11/2020 1128   CO2 25 04/29/2016 1402   GLUCOSE 145 (H) 07/11/2020 1128   GLUCOSE 91 04/29/2016 1402   GLUCOSE 118 (H) 07/10/2012 1341   BUN 15 07/11/2020 1128   BUN 9.7 04/29/2016 1402   CREATININE 0.98 07/11/2020 1128   CREATININE 1.0 04/29/2016 1402   CALCIUM 9.6 07/11/2020 1128   CALCIUM 9.3 04/29/2016 1402   PROT 7.6 07/11/2020 1128   PROT 7.0 04/29/2016 1402   ALBUMIN 3.3 (L) 07/11/2020 1128  ALBUMIN 4.1 04/29/2016 1402   AST 32 07/11/2020 1128   AST 28 04/29/2016 1402   ALT 66 (H) 07/11/2020 1128   ALT 26 04/29/2016 1402   ALKPHOS 124 07/11/2020 1128   ALKPHOS 71 04/29/2016 1402   BILITOT 0.7 07/11/2020 1128   BILITOT 0.43 04/29/2016 1402   GFRNONAA >60 07/11/2020 1128   GFRAA >60 05/02/2017 0901    No results found for: SPEP, UPEP  Lab Results  Component Value Date   WBC 11.2 (H) 07/11/2020   NEUTROABS 9.0 (H) 07/11/2020   HGB 13.5 07/11/2020   HCT 41.8 07/11/2020   MCV 80.1 07/11/2020   PLT 249 07/11/2020      Chemistry      Component Value Date/Time   NA 134 (L) 07/11/2020 1128   NA 141 04/29/2016 1402   K 4.4 07/11/2020 1128   K 4.1 04/29/2016 1402   CL 99 07/11/2020 1128   CL 100 07/10/2012 1341   CO2 25 07/11/2020 1128   CO2 25 04/29/2016 1402   BUN 15 07/11/2020 1128   BUN 9.7 04/29/2016 1402   CREATININE 0.98 07/11/2020 1128   CREATININE 1.0 04/29/2016 1402      Component Value Date/Time   CALCIUM 9.6 07/11/2020 1128   CALCIUM 9.3 04/29/2016 1402   ALKPHOS 124 07/11/2020 1128   ALKPHOS 71 04/29/2016 1402   AST 32 07/11/2020 1128   AST 28 04/29/2016 1402   ALT 66 (H) 07/11/2020 1128   ALT 26 04/29/2016 1402   BILITOT 0.7 07/11/2020 1128    BILITOT 0.43 04/29/2016 1402

## 2020-07-11 NOTE — Research (Signed)
Trial Name:  [id number]: A RANDOMIZED TRIAL ADDRESSING CANCER-RELATED FINANCIAL HARDSHIP THROUGH DELIVERY OF A PROACTIVE FINANCIAL NAVIGATION INTERVENTION (CREDIT)   Consent Note: Patient [name] was identified by Dr. Alvy Bimler as a potential candidate for the above listed study.  This Clinical Research Nurse met with Garth Bigness, [id number] ,and his wife Tobyn Osgood on 07/11/20 in a manner and location that ensures patient privacy to discuss participation in the above listed research study.  Patient is Accompanied by his wife who is also participating in this study .  Patient and wife were previously provided with informed consent documents.  Patient and wife confirmed they have read the informed consent documents. As outlined in the informed consent form, this Nurse and Garth Bigness and Neale Burly discussed the purpose of the research study, the investigational nature of the study, study procedures and requirements for study participation, potential risks and benefits of study participation, as well as alternatives to participation.  This study is not blinded or double-blinded. The patient understands participation is voluntary and they may withdraw from study participation at any time.  Each study arm was reviewed, and randomization discussed.  This study does not involve an investigational drug or device. This study does not involve a placebo. Patient understands enrollment is pending full eligibility review.   Confidentiality and how the patient's and wife's information will be used as part of study participation were discussed.  Patient and wife were informed there is not reimbursement provided for their time and effort spent on trial participation.     All questions were answered to patient's and wife's satisfaction.  The informed consent and separate HIPAA Authorization was reviewed page by page.  The patient's and wife's mental and emotional status is appropriate to provide informed  consent, and the patient and wife verbalize an understanding of study participation.  Patient and wife have agreed to participate in the above listed research study and both have voluntarily signed the informed consent version 01/09/2020 and separate HIPAA Authorization, version 02/25/2020  on 07/11/20 at 10:46AM.  They were provided with a copy of the signed informed consent forms and separate HIPAA Authorization for their reference.  No study specific procedures were obtained prior to the signing of the informed consent document.  Approximately 30 minutes were spent with the patient and wife reviewing the informed consent documents.  Patient was not requested to complete a Release of Information form.   Baseline Data Collection and PROs: After consents were signed, patient and wife provided demographic information for the study including their respective email addresses and social security numbers. They were also provided the Baseline questionnaires to complete.  Research nurse collected the questionnaires and checked for completeness and accuracy.   Plan: Informed patient and wife that after patient is registered to the study then I will email them a link to watch the financial literacy videos. Requested they watch the videos within the next 14 days. They do not have to watch them all but please make note of which ones they watched as this information will be collected in 3 months. Informed that research nurse will notify them of which arm of the study they are randomized either later today or tomorrow.  Next study visit to complete questionnaires will be in 3 months and research nurse will let them know in advance when that will be scheduled. They verbalized understanding.  Thanked patient and wife for participating in this study. Provided my business card and encouraged them to call if  any questions prior to our next contact.   Registration/Randomization: This Nurse has reviewed this patient's  inclusion and exclusion criteria and confirmed Garth Bigness and Neale Burly are eligible for study participation.  Patient and wife will continue with enrollment.   Foye Spurling, BSN, RN Clinical Research Nurse 07/11/2020

## 2020-07-12 ENCOUNTER — Other Ambulatory Visit (HOSPITAL_COMMUNITY): Payer: Self-pay

## 2020-07-12 LAB — T4: T4, Total: 6.2 ug/dL (ref 4.5–12.0)

## 2020-07-13 ENCOUNTER — Other Ambulatory Visit (HOSPITAL_COMMUNITY): Payer: Self-pay

## 2020-07-13 ENCOUNTER — Telehealth: Payer: Self-pay

## 2020-07-13 ENCOUNTER — Encounter: Payer: Self-pay | Admitting: Hematology and Oncology

## 2020-07-13 NOTE — Telephone Encounter (Signed)
OK, we will call and check on him again next week

## 2020-07-13 NOTE — Telephone Encounter (Signed)
-----   Message from Heath Lark, MD sent at 07/13/2020  9:00 AM EDT ----- Can you check and ask how is his pain today?

## 2020-07-13 NOTE — Telephone Encounter (Signed)
Called and given below message. He verbalized understanding. Pain is good, the highest # is at times of 2 to 3. On 6/14 after he got home from treatment he complained of cold chills, denies fever at the time. The next morning the cold chills went away. No complaints today.

## 2020-07-14 ENCOUNTER — Other Ambulatory Visit (HOSPITAL_COMMUNITY): Payer: Self-pay

## 2020-07-15 ENCOUNTER — Other Ambulatory Visit (HOSPITAL_COMMUNITY): Payer: Self-pay

## 2020-07-15 ENCOUNTER — Other Ambulatory Visit: Payer: BC Managed Care – PPO

## 2020-07-15 ENCOUNTER — Inpatient Hospital Stay: Payer: BC Managed Care – PPO

## 2020-07-15 ENCOUNTER — Other Ambulatory Visit: Payer: Self-pay

## 2020-07-15 VITALS — BP 109/81 | HR 84 | Temp 97.7°F | Resp 18

## 2020-07-15 DIAGNOSIS — C029 Malignant neoplasm of tongue, unspecified: Secondary | ICD-10-CM

## 2020-07-15 MED ORDER — HEPARIN SOD (PORK) LOCK FLUSH 100 UNIT/ML IV SOLN
500.0000 [IU] | Freq: Once | INTRAVENOUS | Status: DC | PRN
  Filled 2020-07-15: qty 5

## 2020-07-15 MED ORDER — SODIUM CHLORIDE 0.9% FLUSH
10.0000 mL | INTRAVENOUS | Status: DC | PRN
  Filled 2020-07-15: qty 10

## 2020-07-17 ENCOUNTER — Telehealth: Payer: Self-pay

## 2020-07-17 NOTE — Telephone Encounter (Signed)
Called and given below message. He verbalized understanding. He is doing well. Pain is well controlled. He is having drainage from allergies down his throat that is giving him a sore throat. He is taking Claritin for allergies. Instructed to call the office if needed. He verbalized understanding.

## 2020-07-17 NOTE — Progress Notes (Signed)
See 07/17/20 Telephone Encounter

## 2020-07-17 NOTE — Telephone Encounter (Signed)
-----   Message from Heath Lark, MD sent at 07/17/2020  8:31 AM EDT ----- Can you call and check on him today?

## 2020-07-17 NOTE — Telephone Encounter (Signed)
Notified Dj[facility] of Prior Authorization approval for Morphine Sulfate ER tablets

## 2020-07-20 ENCOUNTER — Other Ambulatory Visit (HOSPITAL_COMMUNITY): Payer: Self-pay

## 2020-07-21 ENCOUNTER — Other Ambulatory Visit: Payer: Self-pay

## 2020-07-21 ENCOUNTER — Telehealth: Payer: Self-pay

## 2020-07-21 ENCOUNTER — Other Ambulatory Visit: Payer: Self-pay | Admitting: Hematology and Oncology

## 2020-07-21 DIAGNOSIS — M109 Gout, unspecified: Secondary | ICD-10-CM

## 2020-07-21 MED ORDER — FLUCONAZOLE 100 MG PO TABS: 100.0000 mg | ORAL_TABLET | Freq: Every day | ORAL | 0 refills | Status: AC

## 2020-07-21 NOTE — Telephone Encounter (Signed)
Returned his call. He stopped the Prednisone for his gout because he was feeling better 2 days ago and he was trying to stop the Prednisone. 6 hours after stopping he started having pain in ankles/feet. He restarted Prednisone yesterday and has started feeling some better.  He is asking if he could take some other type of medication besides the Prednisone?  He is still complaining of thrush. Reminded to keep using the magic mouth wash. Dr. Alvy Bimler gave him a 20 day supply. He verbalized understanding.

## 2020-07-21 NOTE — Telephone Encounter (Signed)
Called and given below message. He verbalized understanding. Rx for Fluconazole sent to preferred pharmacy. He will reduce Prednisone to 5 mg daily. Ask him to call the office if needed. He verbalized understanding.

## 2020-07-21 NOTE — Telephone Encounter (Signed)
Make sure he is drinking at least 100 ounces of water per day I will check uric acid level, if still high we can do rasburicase infusion next visit Please call in fluconazole 100 mg daily for 7 days Tell him to drop prednisone to 5 mg daily or 5 mg alternate with 10 mg every other day to reduce risks of thrush

## 2020-07-25 ENCOUNTER — Other Ambulatory Visit (HOSPITAL_COMMUNITY): Payer: Self-pay

## 2020-07-28 ENCOUNTER — Encounter: Payer: Self-pay | Admitting: Hematology and Oncology

## 2020-08-01 ENCOUNTER — Other Ambulatory Visit: Payer: Self-pay | Admitting: Hematology and Oncology

## 2020-08-01 ENCOUNTER — Encounter: Payer: Self-pay | Admitting: Hematology and Oncology

## 2020-08-01 ENCOUNTER — Inpatient Hospital Stay: Payer: BC Managed Care – PPO

## 2020-08-01 ENCOUNTER — Encounter: Payer: Self-pay | Admitting: *Deleted

## 2020-08-01 ENCOUNTER — Other Ambulatory Visit: Payer: Self-pay

## 2020-08-01 ENCOUNTER — Inpatient Hospital Stay (HOSPITAL_BASED_OUTPATIENT_CLINIC_OR_DEPARTMENT_OTHER): Payer: BC Managed Care – PPO | Admitting: Hematology and Oncology

## 2020-08-01 ENCOUNTER — Ambulatory Visit: Payer: BC Managed Care – PPO | Admitting: Hematology and Oncology

## 2020-08-01 ENCOUNTER — Inpatient Hospital Stay: Payer: BC Managed Care – PPO | Attending: Hematology and Oncology

## 2020-08-01 VITALS — HR 98

## 2020-08-01 VITALS — BP 116/77 | HR 119 | Temp 99.0°F | Resp 18 | Ht 74.0 in | Wt 212.8 lb

## 2020-08-01 DIAGNOSIS — Z452 Encounter for adjustment and management of vascular access device: Secondary | ICD-10-CM | POA: Diagnosis not present

## 2020-08-01 DIAGNOSIS — K5909 Other constipation: Secondary | ICD-10-CM | POA: Insufficient documentation

## 2020-08-01 DIAGNOSIS — K1231 Oral mucositis (ulcerative) due to antineoplastic therapy: Secondary | ICD-10-CM | POA: Diagnosis not present

## 2020-08-01 DIAGNOSIS — C7800 Secondary malignant neoplasm of unspecified lung: Secondary | ICD-10-CM | POA: Diagnosis not present

## 2020-08-01 DIAGNOSIS — C7801 Secondary malignant neoplasm of right lung: Secondary | ICD-10-CM

## 2020-08-01 DIAGNOSIS — M109 Gout, unspecified: Secondary | ICD-10-CM | POA: Diagnosis not present

## 2020-08-01 DIAGNOSIS — C029 Malignant neoplasm of tongue, unspecified: Secondary | ICD-10-CM | POA: Diagnosis present

## 2020-08-01 DIAGNOSIS — C7802 Secondary malignant neoplasm of left lung: Secondary | ICD-10-CM

## 2020-08-01 DIAGNOSIS — Z5112 Encounter for antineoplastic immunotherapy: Secondary | ICD-10-CM | POA: Diagnosis present

## 2020-08-01 DIAGNOSIS — G893 Neoplasm related pain (acute) (chronic): Secondary | ICD-10-CM | POA: Diagnosis not present

## 2020-08-01 DIAGNOSIS — Z5111 Encounter for antineoplastic chemotherapy: Secondary | ICD-10-CM | POA: Diagnosis present

## 2020-08-01 LAB — CMP (CANCER CENTER ONLY)
ALT: 157 U/L — ABNORMAL HIGH (ref 0–44)
AST: 41 U/L (ref 15–41)
Albumin: 2.9 g/dL — ABNORMAL LOW (ref 3.5–5.0)
Alkaline Phosphatase: 180 U/L — ABNORMAL HIGH (ref 38–126)
Anion gap: 12 (ref 5–15)
BUN: 11 mg/dL (ref 6–20)
CO2: 29 mmol/L (ref 22–32)
Calcium: 9.6 mg/dL (ref 8.9–10.3)
Chloride: 99 mmol/L (ref 98–111)
Creatinine: 0.82 mg/dL (ref 0.61–1.24)
GFR, Estimated: >60 mL/min (ref >60)
Glucose, Bld: 104 mg/dL — ABNORMAL HIGH (ref 70–99)
Potassium: 4.2 mmol/L (ref 3.5–5.1)
Sodium: 140 mmol/L (ref 135–145)
Total Bilirubin: 0.5 mg/dL (ref 0.3–1.2)
Total Protein: 7.2 g/dL (ref 6.5–8.1)

## 2020-08-01 LAB — CBC WITH DIFFERENTIAL (CANCER CENTER ONLY)
Abs Immature Granulocytes: 0.07 10*3/uL (ref 0.00–0.07)
Basophils Absolute: 0.1 10*3/uL (ref 0.0–0.1)
Basophils Relative: 1 %
Eosinophils Absolute: 0 10*3/uL (ref 0.0–0.5)
Eosinophils Relative: 0 %
HCT: 38.3 % — ABNORMAL LOW (ref 39.0–52.0)
Hemoglobin: 12.2 g/dL — ABNORMAL LOW (ref 13.0–17.0)
Immature Granulocytes: 1 %
Lymphocytes Relative: 4 %
Lymphs Abs: 0.4 10*3/uL — ABNORMAL LOW (ref 0.7–4.0)
MCH: 25.9 pg — ABNORMAL LOW (ref 26.0–34.0)
MCHC: 31.9 g/dL (ref 30.0–36.0)
MCV: 81.3 fL (ref 80.0–100.0)
Monocytes Absolute: 0.9 10*3/uL (ref 0.1–1.0)
Monocytes Relative: 10 %
Neutro Abs: 7.8 10*3/uL — ABNORMAL HIGH (ref 1.7–7.7)
Neutrophils Relative %: 84 %
Platelet Count: 286 10*3/uL (ref 150–400)
RBC: 4.71 MIL/uL (ref 4.22–5.81)
RDW: 15.9 % — ABNORMAL HIGH (ref 11.5–15.5)
WBC Count: 9.2 10*3/uL (ref 4.0–10.5)
nRBC: 0 % (ref 0.0–0.2)

## 2020-08-01 LAB — URIC ACID: Uric Acid, Serum: 5.9 mg/dL (ref 3.7–8.6)

## 2020-08-01 LAB — TSH: TSH: 4.236 u[IU]/mL — ABNORMAL HIGH (ref 0.320–4.118)

## 2020-08-01 MED ORDER — SODIUM CHLORIDE 0.9 % IV SOLN
800.0000 mg/m2/d | INTRAVENOUS | Status: DC
  Administered 2020-08-01: 6850 mg via INTRAVENOUS
  Filled 2020-08-01 (×3): qty 137

## 2020-08-01 MED ORDER — PALONOSETRON HCL INJECTION 0.25 MG/5ML
INTRAVENOUS | Status: AC
  Filled 2020-08-01: qty 5

## 2020-08-01 MED ORDER — SODIUM CHLORIDE 0.9 % IV SOLN
Freq: Once | INTRAVENOUS | Status: AC
  Filled 2020-08-01: qty 250

## 2020-08-01 MED ORDER — SODIUM CHLORIDE 0.9 % IV SOLN
150.0000 mg | Freq: Once | INTRAVENOUS | Status: AC
  Administered 2020-08-01 (×2): 150 mg via INTRAVENOUS
  Filled 2020-08-01: qty 150

## 2020-08-01 MED ORDER — SODIUM CHLORIDE 0.9 % IV SOLN
750.0000 mg | Freq: Once | INTRAVENOUS | Status: AC
  Administered 2020-08-01: 750 mg via INTRAVENOUS
  Filled 2020-08-01: qty 75

## 2020-08-01 MED ORDER — SODIUM CHLORIDE 0.9% FLUSH
10.0000 mL | Freq: Once | INTRAVENOUS | Status: AC
  Administered 2020-08-01: 10 mL
  Filled 2020-08-01: qty 10

## 2020-08-01 MED ORDER — DEXAMETHASONE SODIUM PHOSPHATE 100 MG/10ML IJ SOLN
10.0000 mg | Freq: Once | INTRAMUSCULAR | Status: AC
  Administered 2020-08-01: 10 mg via INTRAVENOUS
  Filled 2020-08-01 (×2): qty 1
  Filled 2020-08-01: qty 10

## 2020-08-01 MED ORDER — PALONOSETRON HCL INJECTION 0.25 MG/5ML
0.2500 mg | Freq: Once | INTRAVENOUS | Status: AC
  Administered 2020-08-01: 0.25 mg via INTRAVENOUS

## 2020-08-01 MED ORDER — SODIUM CHLORIDE 0.9 % IV SOLN
200.0000 mg | Freq: Once | INTRAVENOUS | Status: AC
  Administered 2020-08-01: 200 mg via INTRAVENOUS
  Filled 2020-08-01: qty 8

## 2020-08-01 NOTE — Assessment & Plan Note (Signed)
His pain is well controlled He will continue MS Contin twice daily and breakthrough morphine as needed We discussed narcotic refill policy

## 2020-08-01 NOTE — Assessment & Plan Note (Signed)
Likely due to side effects of medication We discussed importance of aggressive laxative therapy

## 2020-08-01 NOTE — Progress Notes (Signed)
Ok to proceed with current labs per dr Alvy Bimler

## 2020-08-01 NOTE — Patient Instructions (Signed)
[facility]COL[facility]  Discharge Instructions: Thank you for choosing [facility] to provide your oncology and hematology care.   If you have a lab appointment with the [facility], please go directly to the [facility] and check in at the registration area.   Wear comfortable clothing and clothing appropriate for easy access to any Portacath or PICC line.   We strive to give you quality time with your provider. You may need to reschedule your appointment if you arrive late (15 or more minutes).  Arriving late affects you and other patients whose appointments are after yours.  Also, if you miss three or more appointments without notifying the office, you may be dismissed from the clinic at the provider's discretion.      For prescription refill requests, have your pharmacy contact our office and allow 72 hours for refills to be completed.    Today you received the following chemotherapy and/or immunotherapy agents: Keytruda; Carboplatin; 5-FU    To help prevent nausea and vomiting after your treatment, we encourage you to take your nausea medication as directed.  BELOW ARE SYMPTOMS THAT SHOULD BE REPORTED IMMEDIATELY: *FEVER GREATER THAN 100.4 F (38 C) OR HIGHER *CHILLS OR SWEATING *NAUSEA AND VOMITING THAT IS NOT CONTROLLED WITH YOUR NAUSEA MEDICATION *UNUSUAL SHORTNESS OF BREATH *UNUSUAL BRUISING OR BLEEDING *URINARY PROBLEMS (pain or burning when urinating, or frequent urination) *BOWEL PROBLEMS (unusual diarrhea, constipation, pain near the anus) TENDERNESS IN MOUTH AND THROAT WITH OR WITHOUT PRESENCE OF ULCERS (sore throat, sores in mouth, or a toothache) UNUSUAL RASH, SWELLING OR PAIN  UNUSUAL VAGINAL DISCHARGE OR ITCHING   Items with * indicate a potential emergency and should be followed up as soon as possible or go to the Emergency Department if any problems should occur.  Please show the CHEMOTHERAPY ALERT CARD or IMMUNOTHERAPY ALERT CARD  at check-in to the Emergency Department and triage nurse.  Should you have questions after your visit or need to cancel or reschedule your appointment, please contact [facility]  Dept: [phone]  and follow the prompts.  Office hours are 8:00 a.m. to 4:30 p.m. Monday - Friday. Please note that voicemails left after 4:00 p.m. may not be returned until the following business day.  We are closed weekends and major holidays. You have access to a nurse at all times for urgent questions. Please call the main number to the clinic Dept: [phone] and follow the prompts.   For any non-urgent questions, you may also contact your provider using MyChart. We now offer e-Visits for anyone 65 and older to request care online for non-urgent symptoms. For details visit [email or web address].   Also download the MyChart app! Go to the app store, search "MyChart", open the app, select [facility], and log in with your MyChart username and password.  Due to Covid, a mask is required upon entering the hospital/clinic. If you do not have a mask, one will be given to you upon arrival. For doctor visits, patients may have 1 support person aged 26 or older with them. For treatment visits, patients cannot have anyone with them due to current Covid guidelines and our immunocompromised population.

## 2020-08-01 NOTE — Assessment & Plan Note (Signed)
He had minimal mucositis with recent dose adjustment We will continue similar dose as before

## 2020-08-01 NOTE — Assessment & Plan Note (Signed)
He continues to have profound weight loss with each cycle of treatment I will reduce the dose of chemotherapy based on his most current weight  We will keep carboplatin at 750 mg maximum dose with pembrolizumab same dose at 200 mg Disease in his submandibular region is barely palpable I believe he is responding to treatment We will continue aggressive supportive care I plan to repeat PET CT scan after current cycle of treatment

## 2020-08-01 NOTE — Assessment & Plan Note (Signed)
He tolerated low-dose prednisone well His uric acid is not elevated I plan to taper prednisone next month

## 2020-08-01 NOTE — Progress Notes (Signed)
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 with each cycle of treatment I will reduce the dose of chemotherapy based on his most current weight  We will keep carboplatin at 750 mg maximum dose with pembrolizumab same dose at 200 mg Disease in his submandibular region is barely palpable I believe he is responding to treatment We will continue aggressive supportive care I plan to repeat PET CT scan after current cycle of treatment  Cancer associated pain His pain is well controlled He will continue MS Contin twice daily and breakthrough morphine as needed We discussed narcotic refill policy  Acute gout He tolerated low-dose prednisone well His uric acid is not elevated I plan to taper prednisone next month  Mucositis due to antineoplastic therapy He had minimal mucositis with recent dose adjustment We will continue similar dose as before  Other constipation Likely due to side effects of medication We discussed importance of aggressive laxative therapy  Orders Placed This Encounter  Procedures   NM PET Image Restage (PS) Skull Base to Thigh    Standing Status:   Future    Standing Expiration Date:   08/01/2021    Order Specific Question:   If indicated for the ordered procedure, I authorize the administration of a radiopharmaceutical per Radiology protocol    Answer:   Yes    Order Specific Question:   Preferred imaging location?    Answer:   [facility]    Order Specific Question:   Radiology Contrast Protocol - do NOT remove file path    Answer:    [email or web address]    All questions were answered. The patient knows to call the clinic with any problems, questions or concerns. The total time spent in the appointment was 30 minutes encounter with patients including review of chart and various tests results, discussions about plan of care and coordination of care plan   Heath Lark, MD 08/01/2020 9:42 AM  INTERVAL HISTORY: Please see below for problem oriented charting. 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    09/06/2011 Surgery   He underwent right neck dissection which showed 1/24 LN involved and repeat resection of tongue was negative    03/23/2012 Surgery   Repeat tngue resection showed well differentiated SCC 1.5 cm which invaded into the muscle    04/17/2012 Imaging   PET/CT scan showed New adenopathy in the left neck, compatible with recurrent malignancy    04/23/2012 Surgery   he underwent left neck LN dissection and 2/34 LN were positve    06/01/2012 - 07/13/2012 Chemotherapy   Patient had 3 cycles of cisplatin    06/12/2012 - 07/14/2012 Radiation Therapy   Patient completed  RT    12/10/2012 Imaging   Interval resolution of previous hypermetabolic cervical adenopathy. There is nonspecific asymmetric increased uptake within the right side of tongue    03/13/2013 Imaging   PET/CT scan showed no evidence of disease recurrence. He was noted to have incidental kidney stones    08/15/2014  Imaging   CT scan of the dissection no evidence of disease    05/02/2020 Imaging   CT scan of neck 1. 4.3 x 2.7 x 2.7 cm peripherally enhancing mass at the anterior tongue base with focal sclerosis of the anterior genu of the mandible. 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 2. 8 mm peripherally enhancing left level 2 lymph node is concerning for metastatic disease. Recommend PET scan of the neck to further characterize disease. 3. No other significant recurrent left-sided lymph nodes. 4. Post radiation changes of the carotid sheath bilaterally. 5. Post radiation changes at the lung apices bilaterally. 6. Polyps or mucous retention cysts within the maxillary sinuses bilaterally.   05/24/2020 Pathology Results   A. SUBMENTAL MASS, MIDLINE, NEEDLE CORE BIOPSY:  - Poorly differentiated sarcomatoid malignancy.   COMMENT:   CK7 is positive.  CKAE1/AE3, CK8/18, CK20, p40, CK5/6, S-100, Melan-A and Desmin are negative.  SMA demonstrates nonspecific staining.  Ki-67 proliferation index is high.  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     05/24/2020 Procedure   Successful ultrasound submental mass 18 gauge core biopsy   06/07/2020 PET scan   IMPRESSION: 1. Unfortunately evidence of squamous cell carcinoma recurrence in the floor of the mouth. Broad lesion with intense peripheral metabolic activity in the anterior RIGHT floor the mouth. 2. Bilateral hypermetabolic nodules within the sub submandibular which are new from PET-CT 2015. Concern for unusual metastasis to the submandibular glands. 3. New small bilateral small pulmonary nodules with faint radiotracer activity. These are also concerning for metastasis. 4. Asymmetric hypermetabolic activity within the RIGHT testicle. Favor benign inflammation; however consider testicular ultrasound if concern for unusual pattern of  metastasis.   06/15/2020 Procedure   Successful placement of a right internal jugular approach power injectable Port-A-Cath. The catheter is ready for immediate use.     06/19/2020 -  Chemotherapy    Patient is on Treatment Plan: HEAD/NECK PEMBROLIZUMAB + CARBOPLATIN + 5FU Q21D X 6 CYCLES / PEMBROLIZUMAB Q21D         REVIEW OF SYSTEMS:   Constitutional: Denies fevers, chills or abnormal weight loss Eyes: Denies blurriness of vision Ears, nose, mouth, throat, and face: Denies mucositis or sore throat Respiratory: Denies cough, dyspnea or wheezes Cardiovascular: Denies palpitation, chest discomfort or lower extremity swelling Skin: Denies abnormal skin rashes Lymphatics: Denies new lymphadenopathy or easy bruising Neurological:Denies numbness, tingling or new weaknesses Behavioral/Psych: Mood is stable, no new changes  All other systems were reviewed with the patient and are negative.  I have reviewed the past medical history, past surgical history, social history and family history with the patient and they are unchanged from previous note.  ALLERGIES:  has No Known Allergies.  MEDICATIONS:  Current Outpatient Medications  Medication Sig Dispense Refill   lidocaine-prilocaine (EMLA) cream Apply to affected area once as directed 30 g 3   loratadine (CLARITIN) 10 MG tablet Take 10 mg by mouth daily as needed for allergies.     magic mouthwash (lidocaine, diphenhydrAMINE, alum & mag hydroxide) suspension Swish and spit 5 mls 4 times a day as needed  150 mL 0   magic mouthwash (nystatin, lidocaine, diphenhydrAMINE, alum & mag hydroxide) suspension Swish and spit 5 mls 4 times a day 200 mL 1   morphine (MS CONTIN) 60 MG 12 hr tablet Take 1 tablet (60 mg total) by mouth in the morning, at noon, and at bedtime. 90 tablet 0   morphine (MSIR) 30 MG tablet Take 1 tablet (30 mg total) by mouth every 4 (four) hours as needed for severe pain. 90 tablet 0   ondansetron (ZOFRAN) 8 MG tablet Take  1 tablet (8 mg total) by mouth every 8 (eight) hours as needed for refractory nausea / vomiting. 30 tablet 1   predniSONE (DELTASONE) 10 MG tablet Take 1 tablet (10 mg total) by mouth daily with breakfast. 30 tablet 0   prochlorperazine (COMPAZINE) 10 MG tablet Take 1 tablet (10 mg total) by mouth every 6 (six) hours as needed (Nausea or vomiting). 30 tablet 1   No current facility-administered medications for this visit.   Facility-Administered Medications Ordered in Other Visits  Medication Dose Route Frequency Provider Last Rate Last Admin   CARBOplatin (PARAPLATIN) 750 mg in sodium chloride 0.9 % 250 mL chemo infusion  750 mg Intravenous Once Alvy Bimler, Brynlyn Dade, MD       dexamethasone (DECADRON) 10 mg in sodium chloride 0.9 % 50 mL IVPB  10 mg Intravenous Once Heath Lark, MD 204 mL/hr at 08/01/20 0935 10 mg at 08/01/20 0935   fluorouracil (ADRUCIL) 6,850 mg in sodium chloride 0.9 % 113 mL chemo infusion  800 mg/m2/day (Treatment Plan Adjusted) Intravenous 4 days Heath Lark, MD       fosaprepitant (EMEND) 150 mg in sodium chloride 0.9 % 145 mL IVPB  150 mg Intravenous Once Alvy Bimler, Julias Mould, MD       palonosetron (ALOXI) injection 0.25 mg  0.25 mg Intravenous Once Alvy Bimler, Alondra Vandeven, MD       pembrolizumab (KEYTRUDA) 200 mg in sodium chloride 0.9 % 50 mL chemo infusion  200 mg Intravenous Once Alvy Bimler, Omarri Eich, MD        PHYSICAL EXAMINATION: ECOG PERFORMANCE STATUS: 1 - Symptomatic but completely ambulatory  Vitals:   08/01/20 0809  BP: 116/77  Pulse: (!) 119  Resp: 18  Temp: 99 F (37.2 C)  SpO2: 99%   Filed Weights   08/01/20 0809  Weight: 212 lb 12.8 oz (96.5 kg)    GENERAL:alert, no distress and comfortable SKIN: skin color, texture, turgor are normal, no rashes or significant lesions EYES: normal, Conjunctiva are pink and non-injected, sclera clear OROPHARYNX:no exudate, no erythema and lips, buccal mucosa, and tongue normal  NECK: He has significant radiation-induced fibrosis.  Previously  palpable lesion at the base of his tongue has resolved LYMPH:  no palpable lymphadenopathy in the cervical, axillary or inguinal LUNGS: clear to auscultation and percussion with normal breathing effort HEART: regular rate & rhythm and no murmurs and no lower extremity edema ABDOMEN:abdomen soft, non-tender and normal bowel sounds Musculoskeletal:no cyanosis of digits and no clubbing  NEURO: alert & oriented x 3 with fluent speech, no focal motor/sensory deficits  LABORATORY DATA:  I have reviewed the data as listed    Component Value Date/Time   NA 140 08/01/2020 0745   NA 141 04/29/2016 1402   K 4.2 08/01/2020 0745   K 4.1 04/29/2016 1402   CL 99 08/01/2020 0745   CL 100 07/10/2012 1341   CO2 29 08/01/2020 0745   CO2 25 04/29/2016 1402   GLUCOSE 104 (H) 08/01/2020 0745  GLUCOSE 91 04/29/2016 1402   GLUCOSE 118 (H) 07/10/2012 1341   BUN 11 08/01/2020 0745   BUN 9.7 04/29/2016 1402   CREATININE 0.82 08/01/2020 0745   CREATININE 1.0 04/29/2016 1402   CALCIUM 9.6 08/01/2020 0745   CALCIUM 9.3 04/29/2016 1402   PROT 7.2 08/01/2020 0745   PROT 7.0 04/29/2016 1402   ALBUMIN 2.9 (L) 08/01/2020 0745   ALBUMIN 4.1 04/29/2016 1402   AST 41 08/01/2020 0745   AST 28 04/29/2016 1402   ALT 157 (H) 08/01/2020 0745   ALT 26 04/29/2016 1402   ALKPHOS 180 (H) 08/01/2020 0745   ALKPHOS 71 04/29/2016 1402   BILITOT 0.5 08/01/2020 0745   BILITOT 0.43 04/29/2016 1402   GFRNONAA >60 08/01/2020 0745   GFRAA >60 05/02/2017 0901    No results found for: SPEP, UPEP  Lab Results  Component Value Date   WBC 9.2 08/01/2020   NEUTROABS 7.8 (H) 08/01/2020   HGB 12.2 (L) 08/01/2020   HCT 38.3 (L) 08/01/2020   MCV 81.3 08/01/2020   PLT 286 08/01/2020      Chemistry      Component Value Date/Time   NA 140 08/01/2020 0745   NA 141 04/29/2016 1402   K 4.2 08/01/2020 0745   K 4.1 04/29/2016 1402   CL 99 08/01/2020 0745   CL 100 07/10/2012 1341   CO2 29 08/01/2020 0745   CO2 25  04/29/2016 1402   BUN 11 08/01/2020 0745   BUN 9.7 04/29/2016 1402   CREATININE 0.82 08/01/2020 0745   CREATININE 1.0 04/29/2016 1402      Component Value Date/Time   CALCIUM 9.6 08/01/2020 0745   CALCIUM 9.3 04/29/2016 1402   ALKPHOS 180 (H) 08/01/2020 0745   ALKPHOS 71 04/29/2016 1402   AST 41 08/01/2020 0745   AST 28 04/29/2016 1402   ALT 157 (H) 08/01/2020 0745   ALT 26 04/29/2016 1402   BILITOT 0.5 08/01/2020 0745   BILITOT 0.43 04/29/2016 1402

## 2020-08-01 NOTE — Research (Signed)
[id number]: A RANDOMIZED TRIAL ADDRESSING CANCER-RELATED FINANCIAL HARDSHIP THROUGH DELIVERY OF A PROACTIVE FINANCIAL NAVIGATION INTERVENTION (CREDIT)  Met with patient briefly to ask if he and his wife have been contacted by the study yet for the financial navigation on this study.  Patient states they have not received any phone calls yet. Informed patient this research nurse will follow up with study to find out when patient and wife can expect to be called.  Patient verbalized understanding. He does report they watched some of the financial literacy videos that they were asked to watch.   Thanked patient for his time and will follow up on the financial navigation intervention.  Foye Spurling, BSN, RN Clinical Research Nurse 08/01/2020

## 2020-08-01 NOTE — Research (Signed)
DCP-001: Use of a Clinical Trial Screening Tool to Address Cancer Health Disparities in the [facility] [place])   Patient [name] was identified by this Clinical Research Nurse as a potential candidate for the above listed study.  This Clinical Research Nurse met with Garth Bigness, [id number], on 08/01/20 in a manner and location that ensures patient privacy to discuss participation in the above listed research study.  Patient is Unaccompanied.  A copy of the informed consent document and separate HIPAA Authorization was provided to the patient.  Patient reads, speaks, and understands [place].    Patient was provided the option of taking informed consent documents home to review and was encouraged to review at their convenience with their support network, including other care providers. Patient is comfortable with making a decision regarding study participation today.  As outlined in the informed consent form, this Nurse and Garth Bigness discussed the purpose of the research study, the investigational nature of the study, study procedures and requirements for study participation, potential risks and benefits of study participation, as well as alternatives to participation. This study is not blinded. The patient understands participation is voluntary and they may withdraw from study participation at any time.  This study does not involve randomization.  This study does not involve an investigational drug or device. This study does not involve a placebo. Patient understands enrollment is pending full eligibility review.   Confidentiality and how the patient's information will be used as part of study participation were discussed.  Patient was informed there is not reimbursement provided for their time and effort spent on trial participation.    All questions were answered to patient's satisfaction.  The informed consent and separate HIPAA Authorization was reviewed page  by page.  The patient's mental and emotional status is appropriate to provide informed consent, and the patient verbalizes an understanding of study participation.  Patient has agreed to participate in the above listed research study and has voluntarily signed the informed consent protocol version date 07/21/2018 and separate HIPAA Authorization, version 5 on 08/01/20 at 11:08 AM.  The patient was provided with a copy of the signed informed consent form and separate HIPAA Authorization for their reference.  No study specific procedures were obtained prior to the signing of the informed consent document.  Approximately 15 minutes were spent with the patient reviewing the informed consent documents.    After consent/hippa forms signed, this nurse interviewed patient to ask questions for the study that cannot be found in the EMR.  Patient answered without difficulty. Thanked patient for his time and participation on this study.  Patient meets all eligibility criteria to be enrolled on this study.  Foye Spurling, BSN, RN Clinical Research Nurse 08/01/2020 11:45 AM

## 2020-08-02 LAB — T4: T4, Total: 6.4 ug/dL (ref 4.5–12.0)

## 2020-08-04 ENCOUNTER — Other Ambulatory Visit: Payer: Self-pay

## 2020-08-04 ENCOUNTER — Telehealth: Payer: Self-pay

## 2020-08-04 ENCOUNTER — Other Ambulatory Visit (HOSPITAL_COMMUNITY): Payer: Self-pay

## 2020-08-04 MED ORDER — PREDNISONE 10 MG PO TABS
10.0000 mg | ORAL_TABLET | Freq: Every day | ORAL | 0 refills | Status: DC
  Filled 2020-08-04: qty 30, 30d supply, fill #0

## 2020-08-04 MED ORDER — ALUM & MAG HYDROXIDE-SIMETH 200-200-20 MG/5ML PO SUSP
ORAL | 0 refills | Status: DC
Start: 2020-08-04 — End: 2020-08-17
  Filled 2020-08-04: qty 150, 7d supply, fill #0

## 2020-08-04 NOTE — Telephone Encounter (Signed)
Pt called requesting refill for prednisone and magic mouthwash. Refill sent to [facility] per pt request. He verbalized thanks and understanding,

## 2020-08-05 ENCOUNTER — Inpatient Hospital Stay: Payer: BC Managed Care – PPO

## 2020-08-05 ENCOUNTER — Other Ambulatory Visit (HOSPITAL_COMMUNITY): Payer: Self-pay

## 2020-08-05 ENCOUNTER — Other Ambulatory Visit: Payer: Self-pay

## 2020-08-05 ENCOUNTER — Other Ambulatory Visit: Payer: BC Managed Care – PPO

## 2020-08-05 VITALS — BP 115/78 | HR 95 | Temp 98.4°F | Resp 18 | Ht 74.0 in

## 2020-08-05 DIAGNOSIS — Z5112 Encounter for antineoplastic immunotherapy: Secondary | ICD-10-CM | POA: Diagnosis not present

## 2020-08-05 DIAGNOSIS — C029 Malignant neoplasm of tongue, unspecified: Secondary | ICD-10-CM

## 2020-08-05 MED ORDER — HEPARIN SOD (PORK) LOCK FLUSH 100 UNIT/ML IV SOLN
500.0000 [IU] | Freq: Once | INTRAVENOUS | Status: AC | PRN
  Administered 2020-08-05: 500 [IU]
  Filled 2020-08-05: qty 5

## 2020-08-05 MED ORDER — SODIUM CHLORIDE 0.9% FLUSH
10.0000 mL | INTRAVENOUS | Status: DC | PRN
  Administered 2020-08-05: 10 mL
  Filled 2020-08-05: qty 10

## 2020-08-12 ENCOUNTER — Other Ambulatory Visit (HOSPITAL_COMMUNITY): Payer: Self-pay

## 2020-08-14 ENCOUNTER — Telehealth: Payer: Self-pay

## 2020-08-14 ENCOUNTER — Other Ambulatory Visit (HOSPITAL_COMMUNITY): Payer: Self-pay

## 2020-08-14 NOTE — Telephone Encounter (Signed)
Returned his call. His right jaw is swollen up like a small apple. He woke up like that this morning. Before his biopsy it was swollen like that. It is so large that he is having some difficulty swallowing.

## 2020-08-14 NOTE — Telephone Encounter (Signed)
Called back and schedule appt for 1020 tomorrow with Dr. Alvy Bimler. Instructed to apply ice to jaw prn and take tylenol prn. Instructed to go to ER for worsening symptoms. He verbalized understanding.

## 2020-08-15 ENCOUNTER — Inpatient Hospital Stay (HOSPITAL_BASED_OUTPATIENT_CLINIC_OR_DEPARTMENT_OTHER): Payer: BC Managed Care – PPO | Admitting: Hematology and Oncology

## 2020-08-15 ENCOUNTER — Encounter: Payer: Self-pay | Admitting: Hematology and Oncology

## 2020-08-15 ENCOUNTER — Other Ambulatory Visit: Payer: Self-pay

## 2020-08-15 ENCOUNTER — Other Ambulatory Visit (HOSPITAL_COMMUNITY): Payer: Self-pay

## 2020-08-15 DIAGNOSIS — C029 Malignant neoplasm of tongue, unspecified: Secondary | ICD-10-CM | POA: Diagnosis not present

## 2020-08-15 DIAGNOSIS — Z5112 Encounter for antineoplastic immunotherapy: Secondary | ICD-10-CM | POA: Diagnosis not present

## 2020-08-15 DIAGNOSIS — K1231 Oral mucositis (ulcerative) due to antineoplastic therapy: Secondary | ICD-10-CM

## 2020-08-15 MED ORDER — FLUCONAZOLE 100 MG PO TABS
100.0000 mg | ORAL_TABLET | Freq: Every day | ORAL | 0 refills | Status: DC
  Filled 2020-08-15: qty 7, 7d supply, fill #0

## 2020-08-15 MED ORDER — AMOXICILLIN 500 MG PO CAPS
500.0000 mg | ORAL_CAPSULE | Freq: Two times a day (BID) | ORAL | 0 refills | Status: DC
  Filled 2020-08-15: qty 14, 7d supply, fill #0

## 2020-08-15 NOTE — Assessment & Plan Note (Signed)
His facial puffiness is due to steroid side effects The palpable disease at the base of his tongue is stable He is scheduled for PET CT scan on Friday I will see him next week for further follow-up

## 2020-08-15 NOTE — Progress Notes (Signed)
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   All questions were answered. The patient knows to call the clinic with any problems, questions or concerns. The total time spent in the appointment was 20 minutes encounter with patients including review of chart and various tests results, discussions about plan of care and coordination of care plan   Heath Lark, MD 08/15/2020 12:39 PM  INTERVAL HISTORY: Please see below for problem oriented charting. 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    09/06/2011 Surgery   He underwent right neck dissection which showed 1/24 LN involved and repeat resection of tongue was negative    03/23/2012 Surgery   Repeat tngue resection showed well differentiated SCC 1.5 cm which invaded into  the muscle    04/17/2012 Imaging   PET/CT scan showed New adenopathy in the left neck, compatible with recurrent malignancy    04/23/2012 Surgery   he underwent left neck LN dissection and 2/34 LN were positve    06/01/2012 - 07/13/2012 Chemotherapy   Patient had 3 cycles of cisplatin    06/12/2012 - 07/14/2012 Radiation Therapy   Patient completed RT    12/10/2012 Imaging   Interval resolution of previous hypermetabolic cervical adenopathy. There is nonspecific asymmetric increased uptake within the right side of tongue    03/13/2013 Imaging   PET/CT scan showed no evidence of disease recurrence. He was noted to have incidental kidney stones    08/15/2014 Imaging   CT scan of the dissection no evidence of disease    05/02/2020 Imaging   CT scan of neck 1. 4.3 x 2.7 x 2.7 cm peripherally enhancing mass at the anterior tongue base with focal sclerosis of the anterior genu of the mandible. 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 2. 8 mm peripherally enhancing left level 2 lymph node is concerning for metastatic disease. Recommend PET scan of the neck to further characterize disease. 3. No other significant recurrent left-sided lymph nodes. 4. Post radiation changes of the carotid sheath bilaterally. 5. Post radiation changes at the lung apices bilaterally. 6. Polyps or mucous retention cysts within the maxillary sinuses bilaterally.   05/24/2020 Pathology Results   A. SUBMENTAL MASS, MIDLINE, NEEDLE CORE BIOPSY:  - Poorly differentiated sarcomatoid malignancy.   COMMENT:   CK7 is positive.  CKAE1/AE3, CK8/18, CK20, p40, CK5/6, S-100,  Melan-A and Desmin are negative.  SMA demonstrates nonspecific staining.  Ki-67 proliferation index is high.  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     05/24/2020 Procedure   Successful ultrasound submental mass 18 gauge core  biopsy   06/07/2020 PET scan   IMPRESSION: 1. Unfortunately evidence of squamous cell carcinoma recurrence in the floor of the mouth. Broad lesion with intense peripheral metabolic activity in the anterior RIGHT floor the mouth. 2. Bilateral hypermetabolic nodules within the sub submandibular which are new from PET-CT 2015. Concern for unusual metastasis to the submandibular glands. 3. New small bilateral small pulmonary nodules with faint radiotracer activity. These are also concerning for metastasis. 4. Asymmetric hypermetabolic activity within the RIGHT testicle. Favor benign inflammation; however consider testicular ultrasound if concern for unusual pattern of metastasis.   06/15/2020 Procedure   Successful placement of a right internal jugular approach power injectable Port-A-Cath. The catheter is ready for immediate use.     06/19/2020 -  Chemotherapy    Patient is on Treatment Plan: HEAD/NECK PEMBROLIZUMAB + CARBOPLATIN + 5FU Q21D X 6 CYCLES / PEMBROLIZUMAB Q21D         REVIEW OF SYSTEMS:   Constitutional: Denies fevers, chills or abnormal weight loss Eyes: Denies blurriness of vision Respiratory: Denies cough, dyspnea or wheezes Cardiovascular: Denies palpitation, chest discomfort or lower extremity swelling Gastrointestinal:  Denies nausea, heartburn or change in bowel habits Skin: Denies abnormal skin rashes Lymphatics: Denies new lymphadenopathy or easy bruising Neurological:Denies numbness, tingling or new weaknesses Behavioral/Psych: Mood is stable, no new changes  All other systems were reviewed with the patient and are negative.  I have reviewed the past medical history, past surgical history, social history and family history with the patient and they are unchanged from previous note.  ALLERGIES:  has No Known Allergies.  MEDICATIONS:  Current Outpatient Medications  Medication Sig Dispense Refill   amoxicillin (AMOXIL) 500 MG capsule Take 1 capsule (500 mg total)  by mouth 2 (two) times daily. 14 capsule 0   fluconazole (DIFLUCAN) 100 MG tablet Take 1 tablet (100 mg total) by mouth daily. 7 tablet 0   lidocaine-prilocaine (EMLA) cream Apply to affected area once as directed 30 g 3   loratadine (CLARITIN) 10 MG tablet Take 10 mg by mouth daily as needed for allergies.     magic mouthwash (lidocaine, diphenhydrAMINE, alum & mag hydroxide) suspension Swish and spit 5 mls 4 times a day as needed 150 mL 0   magic mouthwash (nystatin, lidocaine, diphenhydrAMINE, alum & mag hydroxide) suspension Swish and spit 5 mls 4 times a day 200 mL 1   morphine (MS CONTIN) 60 MG 12 hr tablet Take 1 tablet (60 mg total) by mouth in the morning, at noon, and at bedtime. 90 tablet 0   morphine (MSIR) 30 MG tablet Take 1 tablet (30 mg total) by mouth every 4 (four) hours as needed for severe pain. 90 tablet 0   ondansetron (ZOFRAN) 8 MG tablet Take 1 tablet (8 mg total) by mouth every 8 (eight) hours as needed for refractory nausea / vomiting. 30 tablet 1   predniSONE (DELTASONE) 10 MG tablet Take 1 tablet (10 mg total) by mouth daily with breakfast. 30 tablet 0   prochlorperazine (COMPAZINE) 10 MG tablet Take 1 tablet (10 mg total) by mouth every 6 (six) hours as needed (Nausea or vomiting). 30 tablet 1   No current facility-administered medications for this visit.    PHYSICAL  EXAMINATION: ECOG PERFORMANCE STATUS: 1 - Symptomatic but completely ambulatory  Vitals:   08/15/20 1017  BP: 109/78  Pulse: (!) 122  Resp: 18  Temp: (!) 100.6 F (38.1 C)  SpO2: 98%   Filed Weights   08/15/20 1017  Weight: 200 lb 12.8 oz (91.1 kg)    GENERAL:alert, no distress and comfortable.  He looks cushingoid SKIN: skin color, texture, turgor are normal, no rashes or significant lesions.  He has persistent erythematous changes at the base of his mouth. EYES: normal, Conjunctiva are pink and non-injected, sclera clear OROPHARYNX: Noted signs of mucositis and thrush. NECK: supple,  thyroid normal size, non-tender, without nodularity LYMPH:  no palpable lymphadenopathy in the cervical, axillary or inguinal LUNGS: clear to auscultation and percussion with normal breathing effort HEART: regular rate & rhythm and no murmurs and no lower extremity edema ABDOMEN:abdomen soft, non-tender and normal bowel sounds Musculoskeletal:no cyanosis of digits and no clubbing  NEURO: alert & oriented x 3 with fluent speech, no focal motor/sensory deficits  LABORATORY DATA:  I have reviewed the data as listed    Component Value Date/Time   NA 140 08/01/2020 0745   NA 141 04/29/2016 1402   K 4.2 08/01/2020 0745   K 4.1 04/29/2016 1402   CL 99 08/01/2020 0745   CL 100 07/10/2012 1341   CO2 29 08/01/2020 0745   CO2 25 04/29/2016 1402   GLUCOSE 104 (H) 08/01/2020 0745   GLUCOSE 91 04/29/2016 1402   GLUCOSE 118 (H) 07/10/2012 1341   BUN 11 08/01/2020 0745   BUN 9.7 04/29/2016 1402   CREATININE 0.82 08/01/2020 0745   CREATININE 1.0 04/29/2016 1402   CALCIUM 9.6 08/01/2020 0745   CALCIUM 9.3 04/29/2016 1402   PROT 7.2 08/01/2020 0745   PROT 7.0 04/29/2016 1402   ALBUMIN 2.9 (L) 08/01/2020 0745   ALBUMIN 4.1 04/29/2016 1402   AST 41 08/01/2020 0745   AST 28 04/29/2016 1402   ALT 157 (H) 08/01/2020 0745   ALT 26 04/29/2016 1402   ALKPHOS 180 (H) 08/01/2020 0745   ALKPHOS 71 04/29/2016 1402   BILITOT 0.5 08/01/2020 0745   BILITOT 0.43 04/29/2016 1402   GFRNONAA >60 08/01/2020 0745   GFRAA >60 05/02/2017 0901    No results found for: SPEP, UPEP  Lab Results  Component Value Date   WBC 9.2 08/01/2020   NEUTROABS 7.8 (H) 08/01/2020   HGB 12.2 (L) 08/01/2020   HCT 38.3 (L) 08/01/2020   MCV 81.3 08/01/2020   PLT 286 08/01/2020      Chemistry      Component Value Date/Time   NA 140 08/01/2020 0745   NA 141 04/29/2016 1402   K 4.2 08/01/2020 0745   K 4.1 04/29/2016 1402   CL 99 08/01/2020 0745   CL 100 07/10/2012 1341   CO2 29 08/01/2020 0745   CO2 25 04/29/2016  1402   BUN 11 08/01/2020 0745   BUN 9.7 04/29/2016 1402   CREATININE 0.82 08/01/2020 0745   CREATININE 1.0 04/29/2016 1402      Component Value Date/Time   CALCIUM 9.6 08/01/2020 0745   CALCIUM 9.3 04/29/2016 1402   ALKPHOS 180 (H) 08/01/2020 0745   ALKPHOS 71 04/29/2016 1402   AST 41 08/01/2020 0745   AST 28 04/29/2016 1402   ALT 157 (H) 08/01/2020 0745   ALT 26 04/29/2016 1402   BILITOT 0.5 08/01/2020 0745   BILITOT 0.43 04/29/2016 1402

## 2020-08-15 NOTE — Assessment & Plan Note (Signed)
He has recurrent mucositis with signs of oral infection I will put him a course of amoxicillin and fluconazole I will reassess next week

## 2020-08-17 ENCOUNTER — Other Ambulatory Visit (HOSPITAL_COMMUNITY): Payer: Self-pay

## 2020-08-17 ENCOUNTER — Other Ambulatory Visit: Payer: Self-pay

## 2020-08-17 ENCOUNTER — Telehealth: Payer: Self-pay

## 2020-08-17 MED ORDER — MAGIC MOUTHWASH W/LIDOCAINE: 5.0000 mL | Freq: Four times a day (QID) | ORAL | 0 refills | Status: DC

## 2020-08-17 MED ORDER — NYSTATIN 100000 UNIT/ML MT SUSP
OROMUCOSAL | 0 refills | Status: DC
  Filled 2020-08-17: qty 240, 12d supply, fill #0

## 2020-08-17 NOTE — Telephone Encounter (Signed)
Called and told Rx sent. He verbalized understanding. 

## 2020-08-17 NOTE — Telephone Encounter (Signed)
He called and left a message requesting a refill on Magic mouth wash. He can pick it tomorrow when has scan.

## 2020-08-17 NOTE — Telephone Encounter (Signed)
Pls call in [facility] for refill

## 2020-08-18 ENCOUNTER — Other Ambulatory Visit (HOSPITAL_COMMUNITY): Payer: Self-pay

## 2020-08-18 ENCOUNTER — Inpatient Hospital Stay: Payer: BC Managed Care – PPO

## 2020-08-18 ENCOUNTER — Ambulatory Visit (HOSPITAL_COMMUNITY)
Admission: RE | Admit: 2020-08-18 | Discharge: 2020-08-18 | Disposition: A | Discharge status: Home / Self Care | Payer: BC Managed Care – PPO | Source: Ambulatory Visit | Attending: Hematology and Oncology | Admitting: Hematology and Oncology

## 2020-08-18 ENCOUNTER — Other Ambulatory Visit: Payer: Self-pay

## 2020-08-18 DIAGNOSIS — C7801 Secondary malignant neoplasm of right lung: Secondary | ICD-10-CM | POA: Insufficient documentation

## 2020-08-18 DIAGNOSIS — C7802 Secondary malignant neoplasm of left lung: Secondary | ICD-10-CM | POA: Diagnosis present

## 2020-08-18 DIAGNOSIS — C029 Malignant neoplasm of tongue, unspecified: Secondary | ICD-10-CM

## 2020-08-18 DIAGNOSIS — M109 Gout, unspecified: Secondary | ICD-10-CM

## 2020-08-18 DIAGNOSIS — Z5112 Encounter for antineoplastic immunotherapy: Secondary | ICD-10-CM | POA: Diagnosis not present

## 2020-08-18 LAB — URIC ACID: Uric Acid, Serum: 5.5 mg/dL (ref 3.7–8.6)

## 2020-08-18 LAB — CBC WITH DIFFERENTIAL (CANCER CENTER ONLY)
Abs Immature Granulocytes: 0.03 10*3/uL (ref 0.00–0.07)
Basophils Absolute: 0 10*3/uL (ref 0.0–0.1)
Basophils Relative: 0 %
Eosinophils Absolute: 0 10*3/uL (ref 0.0–0.5)
Eosinophils Relative: 0 %
HCT: 32.1 % — ABNORMAL LOW (ref 39.0–52.0)
Hemoglobin: 10.7 g/dL — ABNORMAL LOW (ref 13.0–17.0)
Immature Granulocytes: 1 %
Lymphocytes Relative: 5 %
Lymphs Abs: 0.3 10*3/uL — ABNORMAL LOW (ref 0.7–4.0)
MCH: 26.7 pg (ref 26.0–34.0)
MCHC: 33.3 g/dL (ref 30.0–36.0)
MCV: 80 fL (ref 80.0–100.0)
Monocytes Absolute: 0.5 10*3/uL (ref 0.1–1.0)
Monocytes Relative: 9 %
Neutro Abs: 4.8 10*3/uL (ref 1.7–7.7)
Neutrophils Relative %: 85 %
Platelet Count: 171 10*3/uL (ref 150–400)
RBC: 4.01 MIL/uL — ABNORMAL LOW (ref 4.22–5.81)
RDW: 18.2 % — ABNORMAL HIGH (ref 11.5–15.5)
WBC Count: 5.6 10*3/uL (ref 4.0–10.5)
nRBC: 0 % (ref 0.0–0.2)

## 2020-08-18 LAB — CMP (CANCER CENTER ONLY)
ALT: 63 U/L — ABNORMAL HIGH (ref 0–44)
AST: 29 U/L (ref 15–41)
Albumin: 3.4 g/dL — ABNORMAL LOW (ref 3.5–5.0)
Alkaline Phosphatase: 118 U/L (ref 38–126)
Anion gap: 11 (ref 5–15)
BUN: 13 mg/dL (ref 6–20)
CO2: 29 mmol/L (ref 22–32)
Calcium: 9.2 mg/dL (ref 8.9–10.3)
Chloride: 94 mmol/L — ABNORMAL LOW (ref 98–111)
Creatinine: 0.73 mg/dL (ref 0.61–1.24)
GFR, Estimated: >60 mL/min (ref >60)
Glucose, Bld: 111 mg/dL — ABNORMAL HIGH (ref 70–99)
Potassium: 4.3 mmol/L (ref 3.5–5.1)
Sodium: 134 mmol/L — ABNORMAL LOW (ref 135–145)
Total Bilirubin: 0.7 mg/dL (ref 0.3–1.2)
Total Protein: 7.2 g/dL (ref 6.5–8.1)

## 2020-08-18 LAB — GLUCOSE, CAPILLARY: Glucose-Capillary: 118 mg/dL — ABNORMAL HIGH (ref 70–99)

## 2020-08-18 LAB — TSH: TSH: 2.636 u[IU]/mL (ref 0.320–4.118)

## 2020-08-18 MED ORDER — FLUDEOXYGLUCOSE F - 18 (FDG) INJECTION
10.0000 | Freq: Once | INTRAVENOUS | Status: AC
  Administered 2020-08-18: 10 via INTRAVENOUS

## 2020-08-18 MED ORDER — SODIUM CHLORIDE 0.9% FLUSH
10.0000 mL | Freq: Once | INTRAVENOUS | Status: AC
  Administered 2020-08-18: 10 mL
  Filled 2020-08-18: qty 10

## 2020-08-18 MED FILL — Fosaprepitant Dimeglumine For IV Infusion 150 MG (Base Eq): INTRAVENOUS | Qty: 5 | Status: AC

## 2020-08-18 MED FILL — Dexamethasone Sodium Phosphate Inj 100 MG/10ML: INTRAMUSCULAR | Qty: 1 | Status: AC

## 2020-08-19 LAB — T4: T4, Total: 8.4 ug/dL (ref 4.5–12.0)

## 2020-08-21 ENCOUNTER — Inpatient Hospital Stay (HOSPITAL_BASED_OUTPATIENT_CLINIC_OR_DEPARTMENT_OTHER): Payer: BC Managed Care – PPO | Admitting: Hematology and Oncology

## 2020-08-21 ENCOUNTER — Encounter: Payer: Self-pay | Admitting: Hematology and Oncology

## 2020-08-21 ENCOUNTER — Inpatient Hospital Stay: Payer: BC Managed Care – PPO

## 2020-08-21 ENCOUNTER — Other Ambulatory Visit: Payer: Self-pay

## 2020-08-21 VITALS — BP 109/80 | HR 110 | Temp 98.3°F | Resp 18 | Ht 74.0 in | Wt 200.2 lb

## 2020-08-21 DIAGNOSIS — C7802 Secondary malignant neoplasm of left lung: Secondary | ICD-10-CM

## 2020-08-21 DIAGNOSIS — G893 Neoplasm related pain (acute) (chronic): Secondary | ICD-10-CM | POA: Diagnosis not present

## 2020-08-21 DIAGNOSIS — C7801 Secondary malignant neoplasm of right lung: Secondary | ICD-10-CM

## 2020-08-21 DIAGNOSIS — Z7189 Other specified counseling: Secondary | ICD-10-CM | POA: Diagnosis not present

## 2020-08-21 DIAGNOSIS — C029 Malignant neoplasm of tongue, unspecified: Secondary | ICD-10-CM

## 2020-08-21 DIAGNOSIS — K0889 Other specified disorders of teeth and supporting structures: Secondary | ICD-10-CM

## 2020-08-21 DIAGNOSIS — Z5112 Encounter for antineoplastic immunotherapy: Secondary | ICD-10-CM | POA: Diagnosis not present

## 2020-08-21 NOTE — Assessment & Plan Note (Signed)
His pain is well-controlled He will continue current prescribed pain medicine 

## 2020-08-21 NOTE — Assessment & Plan Note (Addendum)
I have reviewed multiple imaging studies with the patient and family Unfortunately, he has disease progression His prognosis is poor if he progress on combination chemotherapy and immunotherapy Given his young age, I believe he can still tolerate aggressive treatment We reviewed the guidelines We could potentially treat him with cisplatin, docetaxel with cetuximab Treatment intent would be palliative in nature  We reviewed the current NCCN guidelines and decided the treatment based on the publication as follows:  Cetuximab, docetaxel, and cisplatin versus platinum, fluorouracil, and cetuximab as first-line treatment in patients with recurrent or metastatic head and neck squamous-cell carcinoma (GORTEC 2014-01 TPExtreme): a multicentre, open-label, randomised, phase 2 trial.  Gerlene Fee, Auprin A, [place] J, Saada-Bouzid E, [place], Taberna M, [place], Thomasenia Bottoms, [place]itain O, Cupissol D, [place], [place] D, [place] P, [place], Even C, Sire C, Duplomb S, [place], Delord JP, [place] B, Zanetta S, [place], Fraslin A, Louat F, Sinigaglia L, [place] U, Bourhis J, [place] R, GORTEC, AIO, TTCC, and Uni[facility] Head and Neck groups   Lancet Oncol. 2021;22(4):463. Epub 2021 Mar 5.    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 We therefore aimed to compare the efficacy and safety of the TPEx regimen with the standard of care EXTREME regimen (platinum-fluorouracil-cetuximab) in this setting.  METHODS This was a multicentre, open-label, randomised, phase 2 trial, done in 68 centres (cancer centres, university and general hospitals, and private clinics) in [place], [place], and [place]. Eligible patients were aged 18-70 years with histologically confirmed recurrent or  metastatic HNSCC unsuitable for curative treatment; had at least one measurable lesion according to Response Evaluation Criteria in Solid Tumors version 1.1; and had an EasternCooperative Oncology Group (ECOG) performance status of 1 or less. Participants were randomly assigned (1:1) using the [facility] website by investigators or delegated clinical research associates to the TPEx regimen or the EXTREME regimen, with minimisation by ECOG performance status, type of disease evolution, previous cetuximab treatment, and country. The TPEx regimen consisted of docetaxel 75 mg/m2 and cisplatin 75 mg/m2, both intravenously on day 1, and cetuximab on days 1, 8, and 15 (intravenously 400 mg/m2 on day 1 of cycle 1 and 250 mg/m2 weekly subsequently). Four cycles were repeated every 21 days with systematic granulocyte colony-stimulating factor (G-CSF) support at each cycle. In case of disease control after four cycles, intravenous cetuximab 500 mg/m2 was continued every 2 weeks as maintenance therapy until progression or unacceptable toxicity. The EXTREME regimen consisted of fluorouracil 4000 mg/m2 on day 1-4, cisplatin 100 mg/m2 on day 1, and cetuximab on days 1, 8, and 15 (400 mg/m2 on day 1 of cycle 1 and 250 mg/m2 weekly subsequently) all delivered intravenously. Six cycles were delivered every 21 days followed by weekly 250 mg/m2 cetuximab as maintenance therapy in case of disease control. G-CSF support was not mandatory per the protocol in the EXTREME regimen. The primary endpoint was overall survival in the intention-to-treat population; safety was analysed in all patients who received at least one dose of chemotherapy or cetuximab. Enrolment is closed and this is the final analysis. This study is registered at [email or web address], [id number].  FINDINGS Between Nov 06, 2012, and Dec 27, 2015, 541 patients were enrolled and randomly assigned to the two treatment regimens (271 to TPEx, 270 to EXTREME). Two patients in  the TPEx group had major deviations in  consent forms and were not included in the final analysis. Median follow-up was 344 months (IQR 266-448) in the TPEx group and 302 months (255-453) in the [place] group. At data cutoff, 209 patients had died in the TPEx group and 53 had died in the [facility] group. Overall survival did not differ significantly between the groups (median 145 months [95% CI 125-157]in the TPEx group and 134 months [122-154]in the EXTREME group; hazard ratio 089 [95% CI 074-108]; p=023). 214 (81%) of 263 patients in the TPEx group versus 246 (93%) of 265 patients in the EXTREME group had grade 3 or worse adverse events during chemotherapy (p<00001). In the TPEx group, 118 (45%) of 263 patients had at least one serious adverse event versus 143 (54%) of 265 patients in the EXTREME group. 16 patients in the TPEx group and 21 in the EXTREME group died in association with adverse events, including seven patients in each group who had fatal infections (including febrile neutropenia). Eight deaths in the TPEx group and 11 deaths in the EXTREME group were assessed as treatment related, most frequently sepsis or septic shock (four in each treatment group).  INTERPRETATION Although the trial did not meet its primary endpoint, with no significant improvement in overall survival with TPEx versus EXTREME, the TPEx regimen had a favourable safety profile. The TPEx regimen could provide an alternative to standard of care with the EXTREME regimen in the first-line treatment of patients with recurrent or metastatic HNSCC, especially for those who might not be good candidates for up-front pembrolizumab treatment.  We discussed some of the risks, benefits, side-effects of cisplatin, docetaxel and cetuximab  Some of the short term side-effects included, though not limited to, including weight loss, life threatening infections, neuropathy, risk of allergic reactions, need for transfusions of  blood products, nausea, vomiting, mouth sores, change in bowel habits, loss of hair, admission to hospital for various reasons, and risks of death.   Long term side-effects are also discussed including risks of infertility, permanent damage to nerve function, hearing loss, chronic fatigue, kidney damage with possibility needing hemodialysis, and rare secondary malignancy including bone marrow disorders.  The patient is aware that the response rates discussed earlier is not guaranteed.  After a long discussion, patient made an informed decision to proceed with the prescribed plan of care.   Patient education material was dispensed.

## 2020-08-21 NOTE — Assessment & Plan Note (Signed)
His lung nodules are bigger but he is not symptomatic Observe closely

## 2020-08-21 NOTE — Progress Notes (Signed)
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 Unfortunately, he has disease progression His prognosis is poor if he progress on combination chemotherapy and immunotherapy Given his young age, I believe he can still tolerate aggressive treatment We reviewed the guidelines We could potentially treat him with cisplatin, docetaxel with cetuximab Treatment intent would be palliative in nature  We reviewed the current NCCN guidelines and decided the treatment based on the publication as follows:  Cetuximab, docetaxel, and cisplatin versus platinum, fluorouracil, and cetuximab as first-line treatment in patients with recurrent or metastatic head and neck squamous-cell carcinoma (GORTEC 2014-01 TPExtreme): a multicentre, open-label, randomised, phase 2 trial.  Gerlene Fee, Auprin A, Fayette J, Saada-Bouzid E, Poole, Taberna M, Startup, Thomasenia Bottoms, Capitain O, Cupissol D, [place], Pingree D, Schafhausen P, Cameron, Even C, Sire C, Duplomb S, [place], Delord JP, Madill B, Zanetta S, Beaumont, Fraslin A, Louat F, Sinigaglia L, Oak[place] U, Bourhis J, Mesia R, GORTEC, AIO, TTCC, and [facility] Head and Neck groups   Lancet Oncol. 2021;22(4):463. Epub 2021 Mar 5.    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 We therefore aimed to compare the  efficacy and safety of the TPEx regimen with the standard of care EXTREME regimen (platinum-fluorouracil-cetuximab) in this setting.  METHODS This was a multicentre, open-label, randomised, phase 2 trial, done in 68 centres (cancer centres, university and general hospitals, and private clinics) in [place], [place], and [place]. Eligible patients were aged 18-70 years with histologically confirmed recurrent or metastatic HNSCC unsuitable for curative treatment; had at least one measurable lesion according to Response Evaluation Criteria in Solid Tumors version 1.1; and had an EasternCooperative Oncology Group (ECOG) performance status of 1 or less. Participants were randomly assigned (1:1) using the [facility] website by investigators or delegated clinical research associates to the TPEx regimen or the EXTREME regimen, with minimisation by ECOG performance status, type of disease evolution, previous cetuximab treatment, and country. The TPEx regimen consisted of docetaxel 75 mg/m2 and cisplatin 75 mg/m2, both intravenously on day 1, and cetuximab on days 1, 8, and 15 (intravenously 400 mg/m2 on day 1 of cycle 1 and 250 mg/m2 weekly subsequently). Four cycles were repeated every 21 days with systematic granulocyte colony-stimulating factor (G-CSF) support at each cycle. In case of disease control after four cycles, intravenous cetuximab 500 mg/m2 was continued every 2 weeks as maintenance therapy until progression or unacceptable toxicity. The EXTREME regimen consisted of fluorouracil 4000 mg/m2 on day 1-4, cisplatin 100 mg/m2 on day 1, and cetuximab on days 1, 8, and 15 (400 mg/m2 on day 1 of cycle 1 and 250 mg/m2 weekly subsequently) all delivered intravenously. Six cycles were delivered every 21 days followed by weekly 250 mg/m2 cetuximab as maintenance therapy in case of disease control. G-CSF support was not mandatory per the protocol in the EXTREME regimen. The primary endpoint was overall survival in the  intention-to-treat population; safety was analysed in all patients who received at least one  dose of chemotherapy or cetuximab. Enrolment is closed and this is the final analysis. This study is registered at [email or web address], [id number].  FINDINGS Between Nov 06, 2012, and Dec 27, 2015, 541 patients were enrolled and randomly assigned to the two treatment regimens (271 to TPEx, 270 to EXTREME). Two patients in the TPEx group had major deviations in consent forms and were not included in the final analysis. Median follow-up was 344 months (IQR 266-448) in the TPEx group and 302 months (255-453) in the [place] group. At data cutoff, 209 patients had died in the TPEx group and 24 had died in the [place] group. Overall survival did not differ significantly between the groups (median 145 months [95% CI 125-157]in the TPEx group and 134 months [122-154]in the EXTREME group; hazard ratio 089 [95% CI 074-108]; p=023). 214 (81%) of 263 patients in the TPEx group versus 246 (93%) of 265 patients in the EXTREME group had grade 3 or worse adverse events during chemotherapy (p<00001). In the TPEx group, 118 (45%) of 263 patients had at least one serious adverse event versus 143 (54%) of 265 patients in the EXTREME group. 16 patients in the TPEx group and 21 in the EXTREME group died in association with adverse events, including seven patients in each group who had fatal infections (including febrile neutropenia). Eight deaths in the TPEx group and 11 deaths in the EXTREME group were assessed as treatment related, most frequently sepsis or septic shock (four in each treatment group).  INTERPRETATION Although the trial did not meet its primary endpoint, with no significant improvement in overall survival with TPEx versus EXTREME, the TPEx regimen had a favourable safety profile. The TPEx regimen could provide an alternative to standard of care with the EXTREME regimen in the first-line treatment of  patients with recurrent or metastatic HNSCC, especially for those who might not be good candidates for up-front pembrolizumab treatment.  We discussed some of the risks, benefits, side-effects of cisplatin, docetaxel and cetuximab  Some of the short term side-effects included, though not limited to, including weight loss, life threatening infections, neuropathy, risk of allergic reactions, need for transfusions of blood products, nausea, vomiting, mouth sores, change in bowel habits, loss of hair, admission to hospital for various reasons, and risks of death.   Long term side-effects are also discussed including risks of infertility, permanent damage to nerve function, hearing loss, chronic fatigue, kidney damage with possibility needing hemodialysis, and rare secondary malignancy including bone marrow disorders.  The patient is aware that the response rates discussed earlier is not guaranteed.  After a long discussion, patient made an informed decision to proceed with the prescribed plan of care.   Patient education material was dispensed.     Metastasis to lung [facility]) His lung nodules are bigger but he is not symptomatic Observe closely  Cancer associated pain His pain is well controlled He will continue current prescribed pain medicine  Goals of care, counseling/discussion We have extensive discussion about goals of care He is aware that treatment goal is palliative  Tooth loose I recommend dental evaluation and tooth extraction soon as possible  Orders Placed This Encounter  Procedures   CBC with Differential ([facility] Only)    Standing Status:   Standing    Number of Occurrences:   20    Standing Expiration Date:   08/21/2021   CMP ([facility] only)    Standing Status:   Standing    Number of Occurrences:   20    Standing  Expiration Date:   08/21/2021   Magnesium    Standing Status:   Standing    Number of Occurrences:   20    Standing Expiration Date:   08/21/2021    Magnesium    Standing Status:   Standing    Number of Occurrences:   20    Standing Expiration Date:   08/21/2021    All questions were answered. The patient knows to call the clinic with any problems, questions or concerns. The total time spent in the appointment was 40 minutes encounter with patients including review of chart and various tests results, discussions about plan of care and coordination of care plan   Heath Lark, MD 08/21/2020 3:19 PM  INTERVAL HISTORY: Please see below for problem oriented charting. He returns with his wife for further follow-up and review of test results He noted a loose tooth at the bottom of the right jaw His pain is well controlled He denies cough, chest pain or shortness of breath  SUMMARY OF ONCOLOGIC HISTORY: Oncology History  Tongue cancer ([place])  08/22/2011 Surgery   He had partial glossectomy which showed invasive SCC measured 1.3 cm    09/06/2011 Surgery   He underwent right neck dissection which showed 1/24 LN involved and repeat resection of tongue was negative    03/23/2012 Surgery   Repeat tngue resection showed well differentiated SCC 1.5 cm which invaded into the muscle    04/17/2012 Imaging   PET/CT scan showed New adenopathy in the left neck, compatible with recurrent malignancy    04/23/2012 Surgery   he underwent left neck LN dissection and 2/34 LN were positve    06/01/2012 - 07/13/2012 Chemotherapy   Patient had 3 cycles of cisplatin    06/12/2012 - 07/14/2012 Radiation Therapy   Patient completed RT    12/10/2012 Imaging   Interval resolution of previous hypermetabolic cervical adenopathy. There is nonspecific asymmetric increased uptake within the right side of tongue    03/13/2013 Imaging   PET/CT scan showed no evidence of disease recurrence. He was noted to have incidental kidney stones    08/15/2014 Imaging   CT scan of the dissection no evidence of disease    05/02/2020 Imaging   CT scan of neck 1. 4.3 x  2.7 x 2.7 cm peripherally enhancing mass at the anterior tongue base with focal sclerosis of the anterior genu of the mandible. 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 2. 8 mm peripherally enhancing left level 2 lymph node is concerning for metastatic disease. Recommend PET scan of the neck to further characterize disease. 3. No other significant recurrent left-sided lymph nodes. 4. Post radiation changes of the carotid sheath bilaterally. 5. Post radiation changes at the lung apices bilaterally. 6. Polyps or mucous retention cysts within the maxillary sinuses bilaterally.   05/24/2020 Pathology Results   A. SUBMENTAL MASS, MIDLINE, NEEDLE CORE BIOPSY:  - Poorly differentiated sarcomatoid malignancy.   COMMENT:   CK7 is positive.  CKAE1/AE3, CK8/18, CK20, p40, CK5/6, S-100, Melan-A and Desmin are negative.  SMA demonstrates nonspecific staining.  Ki-67 proliferation index is high.  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     05/24/2020 Procedure   Successful ultrasound submental mass 18 gauge core biopsy   06/07/2020 PET scan   IMPRESSION: 1. Unfortunately evidence of squamous cell carcinoma recurrence in the floor of the mouth. Broad lesion with intense peripheral metabolic activity in the  anterior RIGHT floor the mouth. 2. Bilateral hypermetabolic nodules within the sub submandibular which are new from PET-CT 2015. Concern for unusual metastasis to the submandibular glands. 3. New small bilateral small pulmonary nodules with faint radiotracer activity. These are also concerning for metastasis. 4. Asymmetric hypermetabolic activity within the RIGHT testicle. Favor benign inflammation; however consider testicular ultrasound if concern for unusual pattern of metastasis.   06/15/2020 Procedure   Successful placement of a right internal jugular approach power injectable  Port-A-Cath. The catheter is ready for immediate use.     06/19/2020 - 08/05/2020 Chemotherapy          08/21/2020 PET scan   1. Progressive tumor in the floor the mouth with overt permeative destructive changes involving the mandible. 2. Persistent hypermetabolic neck nodes as detailed above. No new findings. 3. Progressive pulmonary metastatic disease. 4. No findings for abdominal/pelvic metastatic disease. 5. Persistent hypermetabolism in the right hemiscrotum most likely due to a varicocele.     08/25/2020 -  Chemotherapy    Patient is on Treatment Plan: HEAD/NECK CISPLATIN + DOCETAXEL Q21D PLUS CETUXIMAB    Patient is on Antibody Plan: HEAD/NECK CETUXIMAB Q7D     08/28/2020 -  Chemotherapy    Patient is on Treatment Plan: HEAD/NECK CISPLATIN + DOCETAXEL Q21D PLUS CETUXIMAB    Patient is on Antibody Plan: HEAD/NECK CETUXIMAB Q7D     Metastasis to lung ([place])  06/09/2020 Initial Diagnosis   Metastasis to lung ([place])    08/28/2020 -  Chemotherapy    Patient is on Treatment Plan: HEAD/NECK CISPLATIN + DOCETAXEL Q21D PLUS CETUXIMAB    Patient is on Antibody Plan: HEAD/NECK CETUXIMAB Q7D       REVIEW OF SYSTEMS:   Constitutional: Denies fevers, chills or abnormal weight loss Eyes: Denies blurriness of vision Ears, nose, mouth, throat, and face: Denies mucositis or sore throat Respiratory: Denies cough, dyspnea or wheezes Cardiovascular: Denies palpitation, chest discomfort or lower extremity swelling Gastrointestinal:  Denies nausea, heartburn or change in bowel habits Skin: Denies abnormal skin rashes Lymphatics: Denies new lymphadenopathy or easy bruising Neurological:Denies numbness, tingling or new weaknesses Behavioral/Psych: Mood is stable, no new changes  All other systems were reviewed with the patient and are negative.  I have reviewed the past medical history, past surgical history, social history and family history with the patient and they are unchanged from  previous note.  ALLERGIES:  has No Known Allergies.  MEDICATIONS:  Current Outpatient Medications  Medication Sig Dispense Refill   amoxicillin (AMOXIL) 500 MG capsule Take 1 capsule (500 mg total) by mouth 2 (two) times daily. 14 capsule 0   fluconazole (DIFLUCAN) 100 MG tablet Take 1 tablet (100 mg total) by mouth daily. 7 tablet 0   loratadine (CLARITIN) 10 MG tablet Take 10 mg by mouth daily as needed for allergies.     magic mouthwash (nystatin, lidocaine, diphenhydrAMINE, alum & mag hydroxide) suspension Swish and spit 5 mls by mouth 4 times a day as needed 240 mL 0   magic mouthwash w/lidocaine SOLN Take 5 mLs by mouth 4 (four) times daily for 10 days. Swish and spit 5 mls 4 times a day 240 mL 0   morphine (MS CONTIN) 60 MG 12 hr tablet Take 1 tablet (60 mg total) by mouth in the morning, at noon, and at bedtime. 90 tablet 0   morphine (MSIR) 30 MG tablet Take 1 tablet (30 mg total) by mouth every 4 (four) hours as needed for severe pain. 90 tablet 0  predniSONE (DELTASONE) 10 MG tablet Take 1 tablet (10 mg total) by mouth daily with breakfast. 30 tablet 0   No current facility-administered medications for this visit.    PHYSICAL EXAMINATION: ECOG PERFORMANCE STATUS: 1 - Symptomatic but completely ambulatory  Vitals:   08/21/20 1159  BP: 109/80  Pulse: (!) 110  Resp: 18  Temp: 98.3 F (36.8 C)  SpO2: 100%   Filed Weights   08/21/20 1159  Weight: 200 lb 3.2 oz (90.8 kg)    GENERAL:alert, no distress and comfortable SKIN: Noted skin discoloration at the bottom of his jaw Noted poor dentition. NEURO: alert & oriented x 3 with fluent speech, no focal motor/sensory deficits  LABORATORY DATA:  I have reviewed the data as listed    Component Value Date/Time   NA 134 (L) 08/18/2020 0859   NA 141 04/29/2016 1402   K 4.3 08/18/2020 0859   K 4.1 04/29/2016 1402   CL 94 (L) 08/18/2020 0859   CL 100 07/10/2012 1341   CO2 29 08/18/2020 0859   CO2 25 04/29/2016 1402    GLUCOSE 111 (H) 08/18/2020 0859   GLUCOSE 91 04/29/2016 1402   GLUCOSE 118 (H) 07/10/2012 1341   BUN 13 08/18/2020 0859   BUN 9.7 04/29/2016 1402   CREATININE 0.73 08/18/2020 0859   CREATININE 1.0 04/29/2016 1402   CALCIUM 9.2 08/18/2020 0859   CALCIUM 9.3 04/29/2016 1402   PROT 7.2 08/18/2020 0859   PROT 7.0 04/29/2016 1402   ALBUMIN 3.4 (L) 08/18/2020 0859   ALBUMIN 4.1 04/29/2016 1402   AST 29 08/18/2020 0859   AST 28 04/29/2016 1402   ALT 63 (H) 08/18/2020 0859   ALT 26 04/29/2016 1402   ALKPHOS 118 08/18/2020 0859   ALKPHOS 71 04/29/2016 1402   BILITOT 0.7 08/18/2020 0859   BILITOT 0.43 04/29/2016 1402   GFRNONAA >60 08/18/2020 0859   GFRAA >60 05/02/2017 0901    No results found for: SPEP, UPEP  Lab Results  Component Value Date   WBC 5.6 08/18/2020   NEUTROABS 4.8 08/18/2020   HGB 10.7 (L) 08/18/2020   HCT 32.1 (L) 08/18/2020   MCV 80.0 08/18/2020   PLT 171 08/18/2020      Chemistry      Component Value Date/Time   NA 134 (L) 08/18/2020 0859   NA 141 04/29/2016 1402   K 4.3 08/18/2020 0859   K 4.1 04/29/2016 1402   CL 94 (L) 08/18/2020 0859   CL 100 07/10/2012 1341   CO2 29 08/18/2020 0859   CO2 25 04/29/2016 1402   BUN 13 08/18/2020 0859   BUN 9.7 04/29/2016 1402   CREATININE 0.73 08/18/2020 0859   CREATININE 1.0 04/29/2016 1402      Component Value Date/Time   CALCIUM 9.2 08/18/2020 0859   CALCIUM 9.3 04/29/2016 1402   ALKPHOS 118 08/18/2020 0859   ALKPHOS 71 04/29/2016 1402   AST 29 08/18/2020 0859   AST 28 04/29/2016 1402   ALT 63 (H) 08/18/2020 0859   ALT 26 04/29/2016 1402   BILITOT 0.7 08/18/2020 0859   BILITOT 0.43 04/29/2016 1402       RADIOGRAPHIC STUDIES: I have reviewed multiple imaging studies with the patient and his wife I have personally reviewed the radiological images as listed and agreed with the findings in the report. NM PET Image Restage (PS) Skull Base to Thigh  Result Date: 08/20/2020 CLINICAL DATA:  Subsequent  treatment strategy for head and neck cancer. EXAM: NUCLEAR MEDICINE PET SKULL BASE  TO THIGH TECHNIQUE: 10.03 mCi F-18 FDG was injected intravenously. Full-ring PET imaging was performed from the skull base to thigh after the radiotracer. CT data was obtained and used for attenuation correction and anatomic localization. Fasting blood glucose: 118 mg/dl COMPARISON:  Multiple prior PET CTs.  The most recent is 06/07/2020 FINDINGS: Mediastinal blood pool activity: SUV max 2.13 Liver activity: SUV max NA NECK: Large necrotic appearing mass in the floor the mouth contains gas. Difficult to measure exactly but definitely increased in size since the prior study. It measures approximately 5.8 x 3.7 cm. I would estimate it measured approximately 4.6 x 2.9 cm on the prior study. SUV max is 13.99 and was previously 9.51. Clearly progressive involvement of the adjacent mandible with permeative destructive bony changes and hypermetabolism. Persistent bilateral hypermetabolic parotid nodes. The right-sided node measures 8 mm and the SUV max is 3.25. It was previously 3.17 Left node measures 9 mm and SUV max is 3.41. It was previously 3.76. 12 mm node near the medial tail of the left parotid gland on image 32/4 has an SUV max of 3.92. This previously measured 8 mm and had an SUV max of 5.14. No new hypermetabolic lymphadenopathy in the neck. Incidental CT findings: none CHEST: No supraclavicular or axillary adenopathy. No mediastinal hilar adenopathy. Numerous small bilateral pulmonary nodules are again demonstrated. 8 mm left upper lobe nodule on image 80/4 demonstrates mild hypermetabolism with SUV max of 2.05. This measured 6 mm on the prior examination. 7.5 mm nodule in the anterior aspect of the left upper lobe on image number 90/for previously measured 5 mm. 8.5 mm nodule in the anterior aspect of the right middle lobe on image number 91/4 previously measured 7 mm. 6.5 mm left lower lobe nodule on 91/4 previously measured 5  mm. New 3 mm right upper lobe nodule on image number 71/4. Incidental CT findings: The Port-A-Cath is stable. ABDOMEN/PELVIS: No abnormal hypermetabolic activity within the liver, pancreas, adrenal glands, or spleen. No hypermetabolic lymph nodes in the abdomen or pelvis. There is persistent hypermetabolism noted in the right hemiscrotum with SUV max of 5.20. It looks like this may be due to a varicocele. Scrotal ultrasound may be helpful for confirmation. Incidental CT findings: Stable renal calculi. SKELETON: No findings suspicious for osseous metastatic disease (other than the direct invasion of the mandible). Incidental CT findings: none IMPRESSION: 1. Progressive tumor in the floor the mouth with overt permeative destructive changes involving the mandible. 2. Persistent hypermetabolic neck nodes as detailed above. No new findings. 3. Progressive pulmonary metastatic disease. 4. No findings for abdominal/pelvic metastatic disease. 5. Persistent hypermetabolism in the right hemiscrotum most likely due to a varicocele. Electronically Signed   By: Marijo Sanes M.D.   On: 08/20/2020 14:36

## 2020-08-21 NOTE — Progress Notes (Signed)
DISCONTINUE OFF PATHWAY REGIMEN - Head and Neck   [id number]:Carboplatin IV D1 + Fluorouracil CIV D1-4 + Pembrolizumab IV D1 q21 Days (C1-6) Followed by Pembrolizumab IV D1 q21 Days:   A cycle is every 21 days:     Carboplatin      Fluorouracil      Pembrolizumab   **Always confirm dose/schedule in your pharmacy ordering system**  REASON: Disease Progression PRIOR TREATMENT: Off Pathway: Carboplatin IV D1 + Fluorouracil CIV D1-4 + Pembrolizumab IV D1 q21 Days (C1-6) Followed by Pembrolizumab IV D1 q21 Days TREATMENT RESPONSE: Progressive Disease (PD)  START OFF PATHWAY REGIMEN - Head and Neck   [id number]:Cisplatin 75 mg/m2 IV D1 + Docetaxel 75 mg/m2 IV D1 q21 Days:   A cycle is every 21 days:     Docetaxel      Cisplatin   **Always confirm dose/schedule in your pharmacy ordering system**  **Administration Notes: cisplatin, docetaxel, cetuximab  Patient Characteristics: Oropharynx, HPV Negative/Unknown, Metastatic, Third Line Disease Classification: Oropharynx HPV Status: Awaiting Test Results Therapeutic Status: Metastatic Disease Line of Therapy: Third Line  Intent of Therapy: Non-Curative / Palliative Intent, Discussed with Patient

## 2020-08-21 NOTE — Assessment & Plan Note (Signed)
I recommend dental evaluation and tooth extraction soon as possible

## 2020-08-21 NOTE — Progress Notes (Signed)
START ON PATHWAY REGIMEN - Head and Neck     Cycle 1: A cycle is 7 days:     Cetuximab    Cycles 2 and beyond: A cycle is every 7 days:     Cetuximab   **Always confirm dose/schedule in your pharmacy ordering system**  Patient Characteristics: Oropharynx, HPV Negative/Unknown, Metastatic, Third Line Disease Classification: Oropharynx HPV Status: Awaiting Test Results Therapeutic Status: Metastatic Disease Line of Therapy: Third Line  Intent of Therapy: Non-Curative / Palliative Intent, Discussed with Patient

## 2020-08-21 NOTE — Assessment & Plan Note (Signed)
We have extensive discussion about goals of care He is aware that treatment goal is palliative

## 2020-08-22 ENCOUNTER — Telehealth: Payer: Self-pay | Admitting: Hematology and Oncology

## 2020-08-22 NOTE — Telephone Encounter (Signed)
Scheduled appointment per 07/26 secure chat. Patient is aware.

## 2020-08-25 ENCOUNTER — Other Ambulatory Visit (HOSPITAL_COMMUNITY): Payer: Self-pay

## 2020-08-25 ENCOUNTER — Other Ambulatory Visit: Payer: Self-pay

## 2020-08-25 ENCOUNTER — Other Ambulatory Visit: Payer: Self-pay | Admitting: Hematology and Oncology

## 2020-08-25 ENCOUNTER — Telehealth: Payer: Self-pay

## 2020-08-25 MED ORDER — NYSTATIN 100000 UNIT/ML MT SUSP
OROMUCOSAL | 0 refills | Status: DC
  Filled 2020-08-25: qty 240, 12d supply, fill #0

## 2020-08-25 MED ORDER — MORPHINE SULFATE 30 MG PO TABS
30.0000 mg | ORAL_TABLET | ORAL | 0 refills | Status: DC | PRN
  Filled 2020-08-25: qty 90, 30d supply, fill #0

## 2020-08-25 MED ORDER — MORPHINE SULFATE ER 60 MG PO TBCR
60.0000 mg | EXTENDED_RELEASE_TABLET | Freq: Three times a day (TID) | ORAL | 0 refills | Status: DC
  Filled 2020-08-25: qty 90, 30d supply, fill #0

## 2020-08-25 NOTE — Telephone Encounter (Signed)
Called and given below message. He verbalized understanding. 

## 2020-08-25 NOTE — Telephone Encounter (Signed)
I sent morphine to [facility] Please call [facility] for [facility] with lidocaine as before Remind them to call a bit earlier in the future

## 2020-08-25 NOTE — Telephone Encounter (Signed)
He called and left a message requesting a refill on Morphine, MS Contin and magic mouth wash Rx's.

## 2020-08-26 ENCOUNTER — Other Ambulatory Visit (HOSPITAL_COMMUNITY): Payer: Self-pay

## 2020-08-28 ENCOUNTER — Other Ambulatory Visit: Payer: Self-pay | Admitting: Hematology and Oncology

## 2020-08-28 ENCOUNTER — Other Ambulatory Visit: Payer: Self-pay

## 2020-08-28 ENCOUNTER — Inpatient Hospital Stay: Payer: BC Managed Care – PPO | Attending: Hematology and Oncology

## 2020-08-28 DIAGNOSIS — C7802 Secondary malignant neoplasm of left lung: Secondary | ICD-10-CM

## 2020-08-28 DIAGNOSIS — Z5111 Encounter for antineoplastic chemotherapy: Secondary | ICD-10-CM | POA: Diagnosis present

## 2020-08-28 DIAGNOSIS — Z452 Encounter for adjustment and management of vascular access device: Secondary | ICD-10-CM | POA: Diagnosis not present

## 2020-08-28 DIAGNOSIS — G893 Neoplasm related pain (acute) (chronic): Secondary | ICD-10-CM | POA: Diagnosis not present

## 2020-08-28 DIAGNOSIS — R748 Abnormal levels of other serum enzymes: Secondary | ICD-10-CM | POA: Diagnosis not present

## 2020-08-28 DIAGNOSIS — Z5112 Encounter for antineoplastic immunotherapy: Secondary | ICD-10-CM | POA: Diagnosis not present

## 2020-08-28 DIAGNOSIS — C029 Malignant neoplasm of tongue, unspecified: Secondary | ICD-10-CM | POA: Diagnosis present

## 2020-08-28 DIAGNOSIS — R21 Rash and other nonspecific skin eruption: Secondary | ICD-10-CM | POA: Insufficient documentation

## 2020-08-28 DIAGNOSIS — R42 Dizziness and giddiness: Secondary | ICD-10-CM | POA: Diagnosis not present

## 2020-08-28 DIAGNOSIS — C7801 Secondary malignant neoplasm of right lung: Secondary | ICD-10-CM

## 2020-08-28 DIAGNOSIS — R110 Nausea: Secondary | ICD-10-CM | POA: Diagnosis not present

## 2020-08-28 DIAGNOSIS — M109 Gout, unspecified: Secondary | ICD-10-CM | POA: Diagnosis not present

## 2020-08-28 DIAGNOSIS — Z5189 Encounter for other specified aftercare: Secondary | ICD-10-CM | POA: Diagnosis not present

## 2020-08-28 DIAGNOSIS — I959 Hypotension, unspecified: Secondary | ICD-10-CM | POA: Insufficient documentation

## 2020-08-28 LAB — CMP (CANCER CENTER ONLY)
ALT: 80 U/L — ABNORMAL HIGH (ref 0–44)
AST: 32 U/L (ref 15–41)
Albumin: 3.1 g/dL — ABNORMAL LOW (ref 3.5–5.0)
Alkaline Phosphatase: 121 U/L (ref 38–126)
Anion gap: 10 (ref 5–15)
BUN: 11 mg/dL (ref 6–20)
CO2: 30 mmol/L (ref 22–32)
Calcium: 9.8 mg/dL (ref 8.9–10.3)
Chloride: 100 mmol/L (ref 98–111)
Creatinine: 0.76 mg/dL (ref 0.61–1.24)
GFR, Estimated: >60 mL/min (ref >60)
Glucose, Bld: 97 mg/dL (ref 70–99)
Potassium: 4.3 mmol/L (ref 3.5–5.1)
Sodium: 140 mmol/L (ref 135–145)
Total Bilirubin: 0.4 mg/dL (ref 0.3–1.2)
Total Protein: 7.1 g/dL (ref 6.5–8.1)

## 2020-08-28 LAB — MAGNESIUM: Magnesium: 2.1 mg/dL (ref 1.7–2.4)

## 2020-08-28 LAB — CBC WITH DIFFERENTIAL (CANCER CENTER ONLY)
Abs Immature Granulocytes: 0.06 10*3/uL (ref 0.00–0.07)
Basophils Absolute: 0 10*3/uL (ref 0.0–0.1)
Basophils Relative: 0 %
Eosinophils Absolute: 0 10*3/uL (ref 0.0–0.5)
Eosinophils Relative: 0 %
HCT: 34.8 % — ABNORMAL LOW (ref 39.0–52.0)
Hemoglobin: 11 g/dL — ABNORMAL LOW (ref 13.0–17.0)
Immature Granulocytes: 1 %
Lymphocytes Relative: 4 %
Lymphs Abs: 0.3 10*3/uL — ABNORMAL LOW (ref 0.7–4.0)
MCH: 26.4 pg (ref 26.0–34.0)
MCHC: 31.6 g/dL (ref 30.0–36.0)
MCV: 83.5 fL (ref 80.0–100.0)
Monocytes Absolute: 0.9 10*3/uL (ref 0.1–1.0)
Monocytes Relative: 12 %
Neutro Abs: 6.2 10*3/uL (ref 1.7–7.7)
Neutrophils Relative %: 83 %
Platelet Count: 445 10*3/uL — ABNORMAL HIGH (ref 150–400)
RBC: 4.17 MIL/uL — ABNORMAL LOW (ref 4.22–5.81)
RDW: 19.4 % — ABNORMAL HIGH (ref 11.5–15.5)
WBC Count: 7.5 10*3/uL (ref 4.0–10.5)
nRBC: 0 % (ref 0.0–0.2)

## 2020-08-28 MED ORDER — HEPARIN SOD (PORK) LOCK FLUSH 100 UNIT/ML IV SOLN
500.0000 [IU] | Freq: Once | INTRAVENOUS | Status: AC
  Administered 2020-08-28: 500 [IU]
  Filled 2020-08-28: qty 5

## 2020-08-28 MED ORDER — SODIUM CHLORIDE 0.9% FLUSH
10.0000 mL | Freq: Once | INTRAVENOUS | Status: AC
  Administered 2020-08-28: 10 mL
  Filled 2020-08-28: qty 10

## 2020-08-29 ENCOUNTER — Inpatient Hospital Stay: Payer: BC Managed Care – PPO

## 2020-08-29 VITALS — BP 95/66 | HR 89 | Temp 98.4°F | Resp 18 | Wt 198.5 lb

## 2020-08-29 DIAGNOSIS — Z5112 Encounter for antineoplastic immunotherapy: Secondary | ICD-10-CM | POA: Diagnosis not present

## 2020-08-29 DIAGNOSIS — C029 Malignant neoplasm of tongue, unspecified: Secondary | ICD-10-CM

## 2020-08-29 MED ORDER — SODIUM CHLORIDE 0.9% FLUSH
10.0000 mL | INTRAVENOUS | Status: DC | PRN
  Administered 2020-08-29: 10 mL
  Filled 2020-08-29: qty 10

## 2020-08-29 MED ORDER — SODIUM CHLORIDE 0.9 % IV SOLN
60.0000 mg/m2 | Freq: Once | INTRAVENOUS | Status: AC
  Administered 2020-08-29: 130 mg via INTRAVENOUS
  Filled 2020-08-29: qty 13

## 2020-08-29 MED ORDER — SODIUM CHLORIDE 0.9 % IV SOLN
10.0000 mg | Freq: Once | INTRAVENOUS | Status: AC
  Administered 2020-08-29: 10 mg via INTRAVENOUS
  Filled 2020-08-29: qty 10

## 2020-08-29 MED ORDER — HEPARIN SOD (PORK) LOCK FLUSH 100 UNIT/ML IV SOLN
500.0000 [IU] | Freq: Once | INTRAVENOUS | Status: AC | PRN
Start: 2020-08-29 — End: 2020-08-29
  Administered 2020-08-29: 500 [IU]
  Filled 2020-08-29: qty 5

## 2020-08-29 MED ORDER — PALONOSETRON HCL INJECTION 0.25 MG/5ML
0.2500 mg | Freq: Once | INTRAVENOUS | Status: AC
  Administered 2020-08-29: 0.25 mg via INTRAVENOUS

## 2020-08-29 MED ORDER — PALONOSETRON HCL INJECTION 0.25 MG/5ML
INTRAVENOUS | Status: AC
  Filled 2020-08-29: qty 5

## 2020-08-29 MED ORDER — POTASSIUM CHLORIDE IN NACL 20-0.9 MEQ/L-% IV SOLN
Freq: Once | INTRAVENOUS | Status: AC
  Filled 2020-08-29: qty 1000

## 2020-08-29 MED ORDER — SODIUM CHLORIDE 0.9 % IV SOLN
Freq: Once | INTRAVENOUS | Status: AC
  Filled 2020-08-29: qty 250

## 2020-08-29 MED ORDER — MAGNESIUM SULFATE 2 GM/50ML IV SOLN
2.0000 g | Freq: Once | INTRAVENOUS | Status: AC
  Administered 2020-08-29: 2 g via INTRAVENOUS

## 2020-08-29 MED ORDER — SODIUM CHLORIDE 0.9 % IV SOLN
75.0000 mg/m2 | Freq: Once | INTRAVENOUS | Status: AC
  Administered 2020-08-29: 164 mg via INTRAVENOUS
  Filled 2020-08-29: qty 164

## 2020-08-29 MED ORDER — MAGNESIUM SULFATE 2 GM/50ML IV SOLN
INTRAVENOUS | Status: AC
  Filled 2020-08-29: qty 50

## 2020-08-29 MED ORDER — SODIUM CHLORIDE 0.9 % IV SOLN
150.0000 mg | Freq: Once | INTRAVENOUS | Status: AC
  Administered 2020-08-29: 150 mg via INTRAVENOUS
  Filled 2020-08-29: qty 150

## 2020-08-29 NOTE — Progress Notes (Signed)
Ok to trt with HR of 117 per Dr. Alvy Bimler

## 2020-08-29 NOTE — Patient Instructions (Addendum)
[facility]  Discharge Instructions: Thank you for choosing [facility] to provide your oncology and hematology care.   If you have a lab appointment with the [facility], please go directly to the [facility] and check in at the registration area.   Wear comfortable clothing and clothing appropriate for easy access to any Portacath or PICC line.   We strive to give you quality time with your provider. You may need to reschedule your appointment if you arrive late (15 or more minutes).  Arriving late affects you and other patients whose appointments are after yours.  Also, if you miss three or more appointments without notifying the office, you may be dismissed from the clinic at the provider's discretion.      For prescription refill requests, have your pharmacy contact our office and allow 72 hours for refills to be completed.    Today you received the following chemotherapy and/or immunotherapy agents: Docetaxol, Cisplatin   To help prevent nausea and vomiting after your treatment, we encourage you to take your nausea medication as directed.  BELOW ARE SYMPTOMS THAT SHOULD BE REPORTED IMMEDIATELY: *FEVER GREATER THAN 100.4 F (38 C) OR HIGHER *CHILLS OR SWEATING *NAUSEA AND VOMITING THAT IS NOT CONTROLLED WITH YOUR NAUSEA MEDICATION *UNUSUAL SHORTNESS OF BREATH *UNUSUAL BRUISING OR BLEEDING *URINARY PROBLEMS (pain or burning when urinating, or frequent urination) *BOWEL PROBLEMS (unusual diarrhea, constipation, pain near the anus) TENDERNESS IN MOUTH AND THROAT WITH OR WITHOUT PRESENCE OF ULCERS (sore throat, sores in mouth, or a toothache) UNUSUAL RASH, SWELLING OR PAIN  UNUSUAL VAGINAL DISCHARGE OR ITCHING   Items with * indicate a potential emergency and should be followed up as soon as possible or go to the Emergency Department if any problems should occur.  Please show the CHEMOTHERAPY ALERT CARD or IMMUNOTHERAPY ALERT CARD at  check-in to the Emergency Department and triage nurse.  Should you have questions after your visit or need to cancel or reschedule your appointment, please contact [facility]  Dept: [phone]  and follow the prompts.  Office hours are 8:00 a.m. to 4:30 p.m. Monday - Friday. Please note that voicemails left after 4:00 p.m. may not be returned until the following business day.  We are closed weekends and major holidays. You have access to a nurse at all times for urgent questions. Please call the main number to the clinic Dept: [phone] and follow the prompts.   For any non-urgent questions, you may also contact your provider using MyChart. We now offer e-Visits for anyone 24 and older to request care online for non-urgent symptoms. For details visit [email or web address].   Also download the MyChart app! Go to the app store, search "MyChart", open the app, select [facility], and log in with your MyChart username and password.  Due to Covid, a mask is required upon entering the hospital/clinic. If you do not have a mask, one will be given to you upon arrival. For doctor visits, patients may have 1 support person aged 84 or older with them. For treatment visits, patients cannot have anyone with them due to current Covid guidelines and our immunocompromised population.   Docetaxel injection What is this medication? DOCETAXEL (doe se TAX el) is a chemotherapy drug. It targets fast dividing cells, like cancer cells, and causes these cells to die. This medicine is used to treat many types of cancers like breast cancer, certain stomach cancers,head and neck cancer, lung cancer, and  prostate cancer. This medicine may be used for other purposes; ask your health care provider orpharmacist if you have questions. COMMON BRAND NAME(S): Docefrez, Taxotere What should I tell my care team before I take this medication? They need to know if you have any of these  conditions: infection (especially a virus infection such as chickenpox, cold sores, or herpes) liver disease low blood counts, like low white cell, platelet, or red cell counts an unusual or allergic reaction to docetaxel, polysorbate 80, other chemotherapy agents, other medicines, foods, dyes, or preservatives pregnant or trying to get pregnant breast-feeding How should I use this medication? This drug is given as an infusion into a vein. It is administered in a hospitalor clinic by a specially trained health care professional. Talk to your pediatrician regarding the use of this medicine in children.Special care may be needed. Overdosage: If you think you have taken too much of this medicine contact apoison control center or emergency room at once. NOTE: This medicine is only for you. Do not share this medicine with others. What if I miss a dose? It is important not to miss your dose. Call your doctor or health careprofessional if you are unable to keep an appointment. What may interact with this medication? Do not take this medicine with any of the following medications: live virus vaccines This medicine may also interact with the following medications: aprepitant certain antibiotics like erythromycin or clarithromycin certain antivirals for HIV or hepatitis certain medicines for fungal infections like fluconazole, itraconazole, ketoconazole, posaconazole, or voriconazole cimetidine ciprofloxacin conivaptan cyclosporine dronedarone fluvoxamine grapefruit juice imatinib verapamil This list may not describe all possible interactions. Give your health care provider a list of all the medicines, herbs, non-prescription drugs, or dietary supplements you use. Also tell them if you smoke, drink alcohol, or use illegaldrugs. Some items may interact with your medicine. What should I watch for while using this medication? Your condition will be monitored carefully while you are receiving this  medicine. You will need important blood work done while you are taking thismedicine. Call your doctor or health care professional for advice if you get a fever, chills or sore throat, or other symptoms of a cold or flu. Do not treat yourself. This drug decreases your body's ability to fight infections. Try toavoid being around people who are sick. Some products may contain alcohol. Ask your health care professional if this medicine contains alcohol. Be sure to tell all health care professionals you are taking this medicine. Certain medicines, like metronidazole and disulfiram, can cause an unpleasant reaction when taken with alcohol. The reaction includes flushing, headache, nausea, vomiting, sweating, and increased thirst. Thereaction can last from 30 minutes to several hours. You may get drowsy or dizzy. Do not drive, use machinery, or do anything that needs mental alertness until you know how this medicine affects you. Do not stand or sit up quickly, especially if you are an older patient. This reduces the risk of dizzy or fainting spells. Alcohol may interfere with the effect ofthis medicine. Talk to your health care professional about your risk of cancer. You may bemore at risk for certain types of cancer if you take this medicine. Do not become pregnant while taking this medicine or for 6 months after stopping it. Women should inform their doctor if they wish to become pregnant or think they might be pregnant. There is a potential for serious side effects to an unborn child. Talk to your health care professional or pharmacist for more information.  Do not breast-feed an infant while taking this medicine orfor 1 week after stopping it. Males who get this medicine must use a condom during sex with females who can get pregnant. If you get a woman pregnant, the baby could have birth defects. The baby could die before they are born. You will need to continue wearing a condom for 3 months after stopping the  medicine. Tell your health care providerright away if your partner becomes pregnant while you are taking this medicine. This may interfere with the ability to father a child. You should talk to yourdoctor or health care professional if you are concerned about your fertility. What side effects may I notice from receiving this medication? Side effects that you should report to your doctor or health care professionalas soon as possible: allergic reactions like skin rash, itching or hives, swelling of the face, lips, or tongue blurred vision breathing problems changes in vision low blood counts - This drug may decrease the number of white blood cells, red blood cells and platelets. You may be at increased risk for infections and bleeding. nausea and vomiting pain, redness or irritation at site where injected pain, tingling, numbness in the hands or feet redness, blistering, peeling, or loosening of the skin, including inside the mouth signs of decreased platelets or bleeding - bruising, pinpoint red spots on the skin, black, tarry stools, nosebleeds signs of decreased red blood cells - unusually weak or tired, fainting spells, lightheadedness signs of infection - fever or chills, cough, sore throat, pain or difficulty passing urine swelling of the ankle, feet, hands Side effects that usually do not require medical attention (report to yourdoctor or health care professional if they continue or are bothersome): constipation diarrhea fingernail or toenail changes hair loss loss of appetite mouth sores muscle pain This list may not describe all possible side effects. Call your doctor for medical advice about side effects. You may report side effects to FDA at1-800-FDA-1088. Where should I keep my medication? This drug is given in a hospital or clinic and will not be stored at home. NOTE: This sheet is a summary. It may not cover all possible information. If you have questions about this medicine,  talk to your doctor, pharmacist, orhealth care provider.  2022 Elsevier/Gold Standard (2018-12-14 19:50:31)  Cisplatin injection What is this medication? CISPLATIN (SIS pla tin) is a chemotherapy drug. It targets fast dividing cells, like cancer cells, and causes these cells to die. This medicine is used totreat many types of cancer like bladder, ovarian, and testicular cancers. This medicine may be used for other purposes; ask your health care provider orpharmacist if you have questions. COMMON BRAND NAME(S): Platinol, Platinol -AQ What should I tell my care team before I take this medication? They need to know if you have any of these conditions: eye disease, vision problems hearing problems kidney disease low blood counts, like white cells, platelets, or red blood cells tingling of the fingers or toes, or other nerve disorder an unusual or allergic reaction to cisplatin, carboplatin, oxaliplatin, other medicines, foods, dyes, or preservatives pregnant or trying to get pregnant breast-feeding How should I use this medication? This drug is given as an infusion into a vein. It is administered in a hospitalor clinic by a specially trained health care professional. Talk to your pediatrician regarding the use of this medicine in children.Special care may be needed. Overdosage: If you think you have taken too much of this medicine contact apoison control center or emergency room at  once. NOTE: This medicine is only for you. Do not share this medicine with others. What if I miss a dose? It is important not to miss a dose. Call your doctor or health careprofessional if you are unable to keep an appointment. What may interact with this medication? This medicine may interact with the following medications: foscarnet certain antibiotics like amikacin, gentamicin, neomycin, polymyxin B, streptomycin, tobramycin, vancomycin This list may not describe all possible interactions. Give your health  care provider a list of all the medicines, herbs, non-prescription drugs, or dietary supplements you use. Also tell them if you smoke, drink alcohol, or use illegaldrugs. Some items may interact with your medicine. What should I watch for while using this medication? Your condition will be monitored carefully while you are receiving this medicine. You will need important blood work done while you are taking thismedicine. This drug may make you feel generally unwell. This is not uncommon, as chemotherapy can affect healthy cells as well as cancer cells. Report any side effects. Continue your course of treatment even though you feel ill unless yourdoctor tells you to stop. This medicine may increase your risk of getting an infection. Call your healthcare professional for advice if you get a fever, chills, or sore throat, or other symptoms of a cold or flu. Do not treat yourself. Try to avoid beingaround people who are sick. Avoid taking medicines that contain aspirin, acetaminophen, ibuprofen, naproxen, or ketoprofen unless instructed by your healthcare professional.These medicines may hide a fever. This medicine may increase your risk to bruise or bleed. Call your doctor orhealth care professional if you notice any unusual bleeding. Be careful brushing and flossing your teeth or using a toothpick because you may get an infection or bleed more easily. If you have any dental work done,tell your dentist you are receiving this medicine. Do not become pregnant while taking this medicine or for 14 months after stopping it. Women should inform their healthcare professional if they wish to become pregnant or think they might be pregnant. Men should not father a child while taking this medicine and for 11 months after stopping it. There is potential for serious side effects to an unborn child. Talk to your healthcareprofessional for more information. Do not breast-feed an infant while taking this medicine. This  medicine has caused ovarian failure in some women. This medicine may make it more difficult to get pregnant. Talk to your healthcare professional if [place] concerned about your fertility. This medicine has caused decreased sperm counts in some men. This may make it more difficult to father a child. Talk to your healthcare professional if [place] concerned about your fertility. Drink fluids as directed while you are taking this medicine. This will helpprotect your kidneys. Call your doctor or health care professional if you get diarrhea. Do not treatyourself. What side effects may I notice from receiving this medication? Side effects that you should report to your doctor or health care professionalas soon as possible: allergic reactions like skin rash, itching or hives, swelling of the face, lips, or tongue blurred vision changes in vision decreased hearing or ringing of the ears nausea, vomiting pain, redness, or irritation at site where injected pain, tingling, numbness in the hands or feet signs and symptoms of bleeding such as bloody or black, tarry stools; red or dark brown urine; spitting up blood or brown material that looks like coffee grounds; red spots on the skin; unusual bruising or bleeding from the eyes, gums, or nose signs and symptoms  of infection like fever; chills; cough; sore throat; pain or trouble passing urine signs and symptoms of kidney injury like trouble passing urine or change in the amount of urine signs and symptoms of low red blood cells or anemia such as unusually weak or tired; feeling faint or lightheaded; falls; breathing problems Side effects that usually do not require medical attention (report to yourdoctor or health care professional if they continue or are bothersome): loss of appetite mouth sores muscle cramps This list may not describe all possible side effects. Call your doctor for medical advice about side effects. You may report side effects to FDA  at1-800-FDA-1088. Where should I keep my medication? This drug is given in a hospital or clinic and will not be stored at home. NOTE: This sheet is a summary. It may not cover all possible information. If you have questions about this medicine, talk to your doctor, pharmacist, orhealth care provider.  2022 Elsevier/Gold Standard (2018-01-09 15:59:17)

## 2020-08-31 ENCOUNTER — Other Ambulatory Visit (HOSPITAL_COMMUNITY): Payer: Self-pay

## 2020-08-31 ENCOUNTER — Encounter: Payer: Self-pay | Admitting: Hematology and Oncology

## 2020-08-31 ENCOUNTER — Inpatient Hospital Stay (HOSPITAL_BASED_OUTPATIENT_CLINIC_OR_DEPARTMENT_OTHER): Payer: BC Managed Care – PPO | Admitting: Hematology and Oncology

## 2020-08-31 ENCOUNTER — Other Ambulatory Visit: Payer: Self-pay

## 2020-08-31 ENCOUNTER — Other Ambulatory Visit: Payer: Self-pay | Admitting: Hematology and Oncology

## 2020-08-31 ENCOUNTER — Inpatient Hospital Stay: Payer: BC Managed Care – PPO

## 2020-08-31 DIAGNOSIS — C7801 Secondary malignant neoplasm of right lung: Secondary | ICD-10-CM

## 2020-08-31 DIAGNOSIS — C029 Malignant neoplasm of tongue, unspecified: Secondary | ICD-10-CM | POA: Diagnosis not present

## 2020-08-31 DIAGNOSIS — G893 Neoplasm related pain (acute) (chronic): Secondary | ICD-10-CM | POA: Diagnosis not present

## 2020-08-31 DIAGNOSIS — Z5112 Encounter for antineoplastic immunotherapy: Secondary | ICD-10-CM | POA: Diagnosis not present

## 2020-08-31 DIAGNOSIS — M109 Gout, unspecified: Secondary | ICD-10-CM | POA: Diagnosis not present

## 2020-08-31 DIAGNOSIS — L270 Generalized skin eruption due to drugs and medicaments taken internally: Secondary | ICD-10-CM | POA: Diagnosis not present

## 2020-08-31 DIAGNOSIS — C7802 Secondary malignant neoplasm of left lung: Secondary | ICD-10-CM

## 2020-08-31 MED ORDER — METHYLPREDNISOLONE SODIUM SUCC 125 MG IJ SOLR
125.0000 mg | Freq: Once | INTRAMUSCULAR | Status: AC | PRN
  Administered 2020-08-31: 125 mg via INTRAVENOUS

## 2020-08-31 MED ORDER — HEPARIN SOD (PORK) LOCK FLUSH 100 UNIT/ML IV SOLN
500.0000 [IU] | Freq: Once | INTRAVENOUS | Status: AC | PRN
  Administered 2020-08-31: 500 [IU]
  Filled 2020-08-31: qty 5

## 2020-08-31 MED ORDER — EMPTY CONTAINERS FLEXIBLE MISC
400.0000 mg/m2 | Freq: Once | Status: AC
  Administered 2020-08-31: 900 mg via INTRAVENOUS
  Filled 2020-08-31: qty 50

## 2020-08-31 MED ORDER — FAMOTIDINE 20 MG IN NS 100 ML IVPB
20.0000 mg | Freq: Once | INTRAVENOUS | Status: AC
  Administered 2020-08-31: 20 mg via INTRAVENOUS

## 2020-08-31 MED ORDER — DIPHENHYDRAMINE HCL 50 MG/ML IJ SOLN
50.0000 mg | Freq: Once | INTRAMUSCULAR | Status: AC | PRN
  Administered 2020-08-31: 50 mg via INTRAVENOUS

## 2020-08-31 MED ORDER — DIPHENHYDRAMINE HCL 50 MG/ML IJ SOLN
25.0000 mg | Freq: Once | INTRAMUSCULAR | Status: AC
Start: 2020-08-31 — End: 2020-08-31
  Administered 2020-08-31: 25 mg via INTRAVENOUS

## 2020-08-31 MED ORDER — FAMOTIDINE 20 MG IN NS 100 ML IVPB
20.0000 mg | Freq: Once | INTRAVENOUS | Status: AC | PRN
  Administered 2020-08-31: 20 mg via INTRAVENOUS

## 2020-08-31 MED ORDER — PEGFILGRASTIM-JMDB 6 MG/0.6ML ~~LOC~~ SOSY
PREFILLED_SYRINGE | SUBCUTANEOUS | Status: AC
  Filled 2020-08-31: qty 0.6

## 2020-08-31 MED ORDER — PROCHLORPERAZINE MALEATE 10 MG PO TABS
10.0000 mg | ORAL_TABLET | Freq: Four times a day (QID) | ORAL | 3 refills | Status: DC | PRN
  Filled 2020-08-31: qty 30, 8d supply, fill #0

## 2020-08-31 MED ORDER — SODIUM CHLORIDE 0.9 % IV SOLN
Freq: Once | INTRAVENOUS | Status: AC
  Filled 2020-08-31: qty 250

## 2020-08-31 MED ORDER — SODIUM CHLORIDE 0.9% FLUSH
10.0000 mL | INTRAVENOUS | Status: DC | PRN
  Administered 2020-08-31: 10 mL
  Filled 2020-08-31: qty 10

## 2020-08-31 MED ORDER — PEGFILGRASTIM-JMDB 6 MG/0.6ML ~~LOC~~ SOSY
6.0000 mg | PREFILLED_SYRINGE | Freq: Once | SUBCUTANEOUS | Status: AC
  Administered 2020-08-31: 6 mg via SUBCUTANEOUS

## 2020-08-31 MED ORDER — PREDNISONE 10 MG PO TABS
10.0000 mg | ORAL_TABLET | Freq: Every day | ORAL | 2 refills | Status: DC
  Filled 2020-08-31: qty 30, 30d supply, fill #0

## 2020-08-31 MED ORDER — DIPHENHYDRAMINE HCL 50 MG/ML IJ SOLN
INTRAMUSCULAR | Status: AC
  Filled 2020-08-31: qty 1

## 2020-08-31 MED ORDER — FAMOTIDINE 20 MG IN NS 100 ML IVPB
INTRAVENOUS | Status: AC
  Filled 2020-08-31: qty 100

## 2020-08-31 NOTE — Assessment & Plan Note (Signed)
He complained of excessive fatigue after recent chemotherapy He was started Erbitux today I reviewed with him expected side effects He will also get G-CSF support I will see him again next week for further follow-up

## 2020-08-31 NOTE — Assessment & Plan Note (Signed)
His pain with is well controlled He will continue current prescribed pain medicine

## 2020-08-31 NOTE — Progress Notes (Addendum)
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 He was started Erbitux today I reviewed with him expected side effects He will also get G-CSF support I will see him again next week for further follow-up  Cancer associated pain His pain with is well controlled He will continue current prescribed pain medicine  Acute gout The oral thrush is resolved I refilled his prescription prednisone which he takes for gout  Drug-induced skin rash He was evaluated in the infusion room several times due to hives That resolved after addition of premedication I will add Solu-Medrol to his future treatment with cetuximab On the days of cetuximab, he is advised not to take prednisone  No orders of the defined types were placed in this encounter.   All questions were answered. The patient knows to call the clinic with any problems, questions or concerns. The total time spent in the appointment was 30 minutes encounter with patients including review of chart and various tests results, discussions about plan of care and coordination of care plan   Heath Lark, MD 08/31/2020 2:59 PM  INTERVAL HISTORY: Please see below for problem oriented charting. 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    09/06/2011 Surgery   He underwent right neck dissection which showed 1/24 LN involved and repeat resection of tongue was negative    03/23/2012 Surgery   Repeat tngue resection showed well differentiated SCC 1.5 cm which invaded into the muscle    04/17/2012 Imaging   PET/CT scan showed New adenopathy in the left neck, compatible with recurrent malignancy    04/23/2012 Surgery   he underwent left neck LN dissection and 2/34 LN were positve    06/01/2012 - 07/13/2012 Chemotherapy   Patient had 3 cycles of cisplatin    06/12/2012 - 07/14/2012 Radiation Therapy   Patient completed RT    12/10/2012 Imaging   Interval resolution of previous hypermetabolic cervical adenopathy. There is nonspecific asymmetric increased uptake within the right side of tongue    03/13/2013 Imaging   PET/CT scan showed no evidence of disease recurrence. He was noted to have incidental kidney stones    08/15/2014 Imaging   CT scan of the dissection no evidence of disease    05/02/2020 Imaging   CT scan of neck 1. 4.3 x 2.7 x 2.7 cm peripherally enhancing mass at the anterior tongue base with focal sclerosis of the anterior genu of the mandible. 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 2. 8 mm peripherally enhancing left level 2 lymph node is concerning for metastatic disease. Recommend PET scan of the neck to further characterize disease. 3. No other significant recurrent left-sided lymph nodes. 4. Post radiation changes of the carotid sheath bilaterally. 5. Post radiation changes at the lung apices bilaterally. 6. Polyps or mucous retention cysts within the maxillary sinuses bilaterally.  05/24/2020 Pathology Results   A. SUBMENTAL MASS, MIDLINE, NEEDLE CORE BIOPSY:  - Poorly differentiated sarcomatoid malignancy.   COMMENT:   CK7 is positive.  CKAE1/AE3, CK8/18, CK20, p40, CK5/6, S-100, Melan-A and Desmin are negative.  SMA  demonstrates nonspecific staining.  Ki-67 proliferation index is high.  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     05/24/2020 Procedure   Successful ultrasound submental mass 18 gauge core biopsy   06/07/2020 PET scan   IMPRESSION: 1. Unfortunately evidence of squamous cell carcinoma recurrence in the floor of the mouth. Broad lesion with intense peripheral metabolic activity in the anterior RIGHT floor the mouth. 2. Bilateral hypermetabolic nodules within the sub submandibular which are new from PET-CT 2015. Concern for unusual metastasis to the submandibular glands. 3. New small bilateral small pulmonary nodules with faint radiotracer activity. These are also concerning for metastasis. 4. Asymmetric hypermetabolic activity within the RIGHT testicle. Favor benign inflammation; however consider testicular ultrasound if concern for unusual pattern of metastasis.   06/15/2020 Procedure   Successful placement of a right internal jugular approach power injectable Port-A-Cath. The catheter is ready for immediate use.     06/19/2020 - 08/05/2020 Chemotherapy          08/21/2020 PET scan   1. Progressive tumor in the floor the mouth with overt permeative destructive changes involving the mandible. 2. Persistent hypermetabolic neck nodes as detailed above. No new findings. 3. Progressive pulmonary metastatic disease. 4. No findings for abdominal/pelvic metastatic disease. 5. Persistent hypermetabolism in the right hemiscrotum most likely due to a varicocele.     08/29/2020 -  Chemotherapy    Patient is on Treatment Plan: HEAD/NECK CISPLATIN + DOCETAXEL Q21D PLUS CETUXIMAB    Patient is on Antibody Plan: HEAD/NECK CETUXIMAB Q7D     08/31/2020 -  Chemotherapy    Patient is on Treatment Plan: HEAD/NECK CISPLATIN + DOCETAXEL Q21D PLUS CETUXIMAB    Patient is on Antibody Plan: HEAD/NECK CETUXIMAB Q7D     Metastasis to lung ([place])   06/09/2020 Initial Diagnosis   Metastasis to lung ([place])    08/31/2020 -  Chemotherapy    Patient is on Treatment Plan: HEAD/NECK CISPLATIN + DOCETAXEL Q21D PLUS CETUXIMAB    Patient is on Antibody Plan: HEAD/NECK CETUXIMAB Q7D       REVIEW OF SYSTEMS:   Constitutional: Denies fevers, chills or abnormal weight loss Eyes: Denies blurriness of vision Respiratory: Denies cough, dyspnea or wheezes Cardiovascular: Denies palpitation, chest discomfort or lower extremity swelling Gastrointestinal:  Denies nausea, heartburn or change in bowel habits Skin: Denies abnormal skin rashes Lymphatics: Denies new lymphadenopathy or easy bruising Neurological:Denies numbness, tingling or new weaknesses Behavioral/Psych: Mood is stable, no new changes  All other systems were reviewed with the patient and are negative.  I have reviewed the past medical history, past surgical history, social history and family history with the patient and they are unchanged from previous note.  ALLERGIES:  has No Known Allergies.  MEDICATIONS:  Current Outpatient Medications  Medication Sig Dispense Refill   prochlorperazine (COMPAZINE) 10 MG tablet Take 1 tablet (10 mg total) by mouth every 6 (six) hours as needed for nausea or vomiting. 30 tablet 3   loratadine (CLARITIN) 10 MG tablet Take 10 mg by mouth daily as needed for allergies.     magic mouthwash (nystatin, lidocaine, diphenhydrAMINE, alum & mag hydroxide) suspension Swish and spit 5 mls by mouth 4 times a day as needed 240 mL 0  morphine (MS CONTIN) 60 MG 12 hr tablet Take 1 tablet (60 mg total) by mouth in the morning, at noon, and at bedtime. 90 tablet 0   morphine (MSIR) 30 MG tablet Take 1 tablet (30 mg total) by mouth every 4 (four) hours as needed for severe pain. 90 tablet 0   predniSONE (DELTASONE) 10 MG tablet Take 1 tablet (10 mg total) by mouth daily with breakfast. 30 tablet 2   No current facility-administered medications for this visit.    Facility-Administered Medications Ordered in Other Visits  Medication Dose Route Frequency Provider Last Rate Last Admin   heparin lock flush 100 unit/mL  500 Units Intracatheter Once PRN Alvy Bimler, Sanya Kobrin, MD       sodium chloride flush (NS) 0.9 % injection 10 mL  10 mL Intracatheter PRN Alvy Bimler, Perrin Gens, MD        PHYSICAL EXAMINATION: ECOG PERFORMANCE STATUS: 1 - Symptomatic but completely ambulatory  Vitals:   08/31/20 1017  BP: 113/81  Pulse: 92  Resp: 18  Temp: 97.6 F (36.4 C)  SpO2: 100%   Filed Weights   08/31/20 1017  Weight: 201 lb 9.6 oz (91.4 kg)    GENERAL:alert, no distress and comfortable SKIN: Discoloration under his chin is consistent with his disease EYES: normal, Conjunctiva are pink and non-injected, sclera clear OROPHARYNX: No evidence of thrush NECK: supple, thyroid normal size, non-tender, without nodularity LYMPH:  no palpable lymphadenopathy in the cervical, axillary or inguinal LUNGS: clear to auscultation and percussion with normal breathing effort HEART: regular rate & rhythm and no murmurs and no lower extremity edema ABDOMEN:abdomen soft, non-tender and normal bowel sounds Musculoskeletal:no cyanosis of digits and no clubbing  NEURO: alert & oriented x 3 with fluent speech, no focal motor/sensory deficits  LABORATORY DATA:  I have reviewed the data as listed    Component Value Date/Time   NA 140 08/28/2020 0911   NA 141 04/29/2016 1402   K 4.3 08/28/2020 0911   K 4.1 04/29/2016 1402   CL 100 08/28/2020 0911   CL 100 07/10/2012 1341   CO2 30 08/28/2020 0911   CO2 25 04/29/2016 1402   GLUCOSE 97 08/28/2020 0911   GLUCOSE 91 04/29/2016 1402   GLUCOSE 118 (H) 07/10/2012 1341   BUN 11 08/28/2020 0911   BUN 9.7 04/29/2016 1402   CREATININE 0.76 08/28/2020 0911   CREATININE 1.0 04/29/2016 1402   CALCIUM 9.8 08/28/2020 0911   CALCIUM 9.3 04/29/2016 1402   PROT 7.1 08/28/2020 0911   PROT 7.0 04/29/2016 1402   ALBUMIN 3.1 (L) 08/28/2020 0911    ALBUMIN 4.1 04/29/2016 1402   AST 32 08/28/2020 0911   AST 28 04/29/2016 1402   ALT 80 (H) 08/28/2020 0911   ALT 26 04/29/2016 1402   ALKPHOS 121 08/28/2020 0911   ALKPHOS 71 04/29/2016 1402   BILITOT 0.4 08/28/2020 0911   BILITOT 0.43 04/29/2016 1402   GFRNONAA >60 08/28/2020 0911   GFRAA >60 05/02/2017 0901    No results found for: SPEP, UPEP  Lab Results  Component Value Date   WBC 7.5 08/28/2020   NEUTROABS 6.2 08/28/2020   HGB 11.0 (L) 08/28/2020   HCT 34.8 (L) 08/28/2020   MCV 83.5 08/28/2020   PLT 445 (H) 08/28/2020      Chemistry      Component Value Date/Time   NA 140 08/28/2020 0911   NA 141 04/29/2016 1402   K 4.3 08/28/2020 0911   K 4.1 04/29/2016 1402   CL 100  08/28/2020 0911   CL 100 07/10/2012 1341   CO2 30 08/28/2020 0911   CO2 25 04/29/2016 1402   BUN 11 08/28/2020 0911   BUN 9.7 04/29/2016 1402   CREATININE 0.76 08/28/2020 0911   CREATININE 1.0 04/29/2016 1402      Component Value Date/Time   CALCIUM 9.8 08/28/2020 0911   CALCIUM 9.3 04/29/2016 1402   ALKPHOS 121 08/28/2020 0911   ALKPHOS 71 04/29/2016 1402   AST 32 08/28/2020 0911   AST 28 04/29/2016 1402   ALT 80 (H) 08/28/2020 0911   ALT 26 04/29/2016 1402   BILITOT 0.4 08/28/2020 0911   BILITOT 0.43 04/29/2016 1402

## 2020-08-31 NOTE — Assessment & Plan Note (Signed)
The oral thrush is resolved I refilled his prescription prednisone which he takes for gout

## 2020-08-31 NOTE — Assessment & Plan Note (Signed)
He was evaluated in the infusion room several times due to hives That resolved after addition of premedication I will add Solu-Medrol to his future treatment with cetuximab On the days of cetuximab, he is advised not to take prednisone

## 2020-08-31 NOTE — Addendum Note (Signed)
Addended byAlvy Bimler, Careen Mauch on: 08/31/2020 03:00 PM   Modules accepted: Level of Service

## 2020-08-31 NOTE — Patient Instructions (Signed)
[place] ONCOLOGY   Discharge Instructions: Thank you for choosing [facility] to provide your oncology and hematology care.   If you have a lab appointment with the [facility], please go directly to the [facility] and check in at the registration area.   Wear comfortable clothing and clothing appropriate for easy access to any Portacath or PICC line.   We strive to give you quality time with your provider. You may need to reschedule your appointment if you arrive late (15 or more minutes).  Arriving late affects you and other patients whose appointments are after yours.  Also, if you miss three or more appointments without notifying the office, you may be dismissed from the clinic at the provider's discretion.      For prescription refill requests, have your pharmacy contact our office and allow 72 hours for refills to be completed.    Today you received the following chemotherapy and/or immunotherapy agents: cetuximab.      To help prevent nausea and vomiting after your treatment, we encourage you to take your nausea medication as directed.  BELOW ARE SYMPTOMS THAT SHOULD BE REPORTED IMMEDIATELY: *FEVER GREATER THAN 100.4 F (38 C) OR HIGHER *CHILLS OR SWEATING *NAUSEA AND VOMITING THAT IS NOT CONTROLLED WITH YOUR NAUSEA MEDICATION *UNUSUAL SHORTNESS OF BREATH *UNUSUAL BRUISING OR BLEEDING *URINARY PROBLEMS (pain or burning when urinating, or frequent urination) *BOWEL PROBLEMS (unusual diarrhea, constipation, pain near the anus) TENDERNESS IN MOUTH AND THROAT WITH OR WITHOUT PRESENCE OF ULCERS (sore throat, sores in mouth, or a toothache) UNUSUAL RASH, SWELLING OR PAIN  UNUSUAL VAGINAL DISCHARGE OR ITCHING   Items with * indicate a potential emergency and should be followed up as soon as possible or go to the Emergency Department if any problems should occur.  Please show the CHEMOTHERAPY ALERT CARD or IMMUNOTHERAPY ALERT CARD at check-in  to the Emergency Department and triage nurse.  Should you have questions after your visit or need to cancel or reschedule your appointment, please contact [facility]  Dept: [phone]  and follow the prompts.  Office hours are 8:00 a.m. to 4:30 p.m. Monday - Friday. Please note that voicemails left after 4:00 p.m. may not be returned until the following business day.  We are closed weekends and major holidays. You have access to a nurse at all times for urgent questions. Please call the main number to the clinic Dept: [phone] and follow the prompts.   For any non-urgent questions, you may also contact your provider using MyChart. We now offer e-Visits for anyone 22 and older to request care online for non-urgent symptoms. For details visit [email or web address].   Also download the MyChart app! Go to the app store, search "MyChart", open the app, select [facility], and log in with your MyChart username and password.  Due to Covid, a mask is required upon entering the hospital/clinic. If you do not have a mask, one will be given to you upon arrival. For doctor visits, patients may have 1 support person aged 28 or older with them. For treatment visits, patients cannot have anyone with them due to current Covid guidelines and our immunocompromised population.

## 2020-09-01 ENCOUNTER — Telehealth: Payer: Self-pay | Admitting: Hematology and Oncology

## 2020-09-01 ENCOUNTER — Telehealth: Payer: Self-pay

## 2020-09-01 NOTE — Telephone Encounter (Signed)
Scheduled per 8/4 los. Pt will receive an updated appt calendar per next visit appt note

## 2020-09-01 NOTE — Telephone Encounter (Signed)
-----   Message from Heath Lark, MD sent at 09/01/2020  8:04 AM EDT ----- Can you call and check on him today? He had mild hives with treatment yesterday

## 2020-09-01 NOTE — Progress Notes (Signed)
Hypersensitivity Reaction note  Date of event: 08/31/2020 Time of event: 1245 Generic name of drug involved: cetuximab Name of provider notified of the hypersensitivity reaction: Dr. Heath Lark Was agent that likely caused hypersensitivity reaction added to Allergies List within EMR? Yes Chain of events including reaction signs/symptoms, treatment administered, and outcome (e.g., drug resumed; drug discontinued; sent to Emergency Department; etc.) Patient developed multiple urticarias on scalp and anterior/posterior trunk. Cetuximab paused. Medications administered as documented in [facility]. Dr. Alvy Bimler came to infusion room to evaluate patient. Symptoms completely resolved and cetuximab infusion resumed and completed without further incident.   Rickard Patience, RN 09/01/2020 1:25 PM

## 2020-09-01 NOTE — Telephone Encounter (Signed)
Called and given below message. He verbalized understanding. Hives are gone. He is doing well and just tired. He will call the office back for questions or concerns.

## 2020-09-05 ENCOUNTER — Telehealth: Payer: Self-pay

## 2020-09-05 NOTE — Telephone Encounter (Signed)
Called and given below message to wife. Added appt for 8/11 for 8 am for Dr. Alvy Bimler to discuss. Wife verbalized understanding.

## 2020-09-05 NOTE — Telephone Encounter (Signed)
Wife [name] called and left a message. She is concerned about weight loss. In June he weighted 276 lb and now is down to about 180 lbs. He drinks about 30-32 ounces of water per day and 1 ensure protein shake per day.  She is asking for feeding tube. She is afraid that he will not be able to complete chemo treatment due to the weight loss.

## 2020-09-05 NOTE — Telephone Encounter (Signed)
Tell his wife I will discuss with her and Estal on Thursday

## 2020-09-07 ENCOUNTER — Other Ambulatory Visit (HOSPITAL_COMMUNITY): Payer: Self-pay

## 2020-09-07 ENCOUNTER — Encounter: Payer: Self-pay | Admitting: Hematology and Oncology

## 2020-09-07 ENCOUNTER — Inpatient Hospital Stay: Payer: BC Managed Care – PPO

## 2020-09-07 ENCOUNTER — Other Ambulatory Visit: Payer: Self-pay

## 2020-09-07 ENCOUNTER — Other Ambulatory Visit: Payer: Self-pay | Admitting: Hematology and Oncology

## 2020-09-07 ENCOUNTER — Inpatient Hospital Stay (HOSPITAL_BASED_OUTPATIENT_CLINIC_OR_DEPARTMENT_OTHER): Payer: BC Managed Care – PPO | Admitting: Hematology and Oncology

## 2020-09-07 VITALS — BP 112/76 | HR 115 | Temp 98.7°F | Resp 18 | Ht 74.0 in | Wt 188.4 lb

## 2020-09-07 DIAGNOSIS — L270 Generalized skin eruption due to drugs and medicaments taken internally: Secondary | ICD-10-CM | POA: Diagnosis not present

## 2020-09-07 DIAGNOSIS — C7801 Secondary malignant neoplasm of right lung: Secondary | ICD-10-CM

## 2020-09-07 DIAGNOSIS — C7802 Secondary malignant neoplasm of left lung: Secondary | ICD-10-CM

## 2020-09-07 DIAGNOSIS — C029 Malignant neoplasm of tongue, unspecified: Secondary | ICD-10-CM | POA: Diagnosis not present

## 2020-09-07 DIAGNOSIS — M109 Gout, unspecified: Secondary | ICD-10-CM

## 2020-09-07 DIAGNOSIS — B379 Candidiasis, unspecified: Secondary | ICD-10-CM | POA: Diagnosis not present

## 2020-09-07 DIAGNOSIS — R634 Abnormal weight loss: Secondary | ICD-10-CM

## 2020-09-07 DIAGNOSIS — B370 Candidal stomatitis: Secondary | ICD-10-CM

## 2020-09-07 DIAGNOSIS — R748 Abnormal levels of other serum enzymes: Secondary | ICD-10-CM

## 2020-09-07 DIAGNOSIS — Z5112 Encounter for antineoplastic immunotherapy: Secondary | ICD-10-CM | POA: Diagnosis not present

## 2020-09-07 LAB — CBC WITH DIFFERENTIAL (CANCER CENTER ONLY)
Abs Immature Granulocytes: 0.52 10*3/uL — ABNORMAL HIGH (ref 0.00–0.07)
Basophils Absolute: 0 10*3/uL (ref 0.0–0.1)
Basophils Relative: 1 %
Eosinophils Absolute: 0 10*3/uL (ref 0.0–0.5)
Eosinophils Relative: 0 %
HCT: 35.3 % — ABNORMAL LOW (ref 39.0–52.0)
Hemoglobin: 11.9 g/dL — ABNORMAL LOW (ref 13.0–17.0)
Immature Granulocytes: 9 %
Lymphocytes Relative: 5 %
Lymphs Abs: 0.3 10*3/uL — ABNORMAL LOW (ref 0.7–4.0)
MCH: 26.6 pg (ref 26.0–34.0)
MCHC: 33.7 g/dL (ref 30.0–36.0)
MCV: 78.8 fL — ABNORMAL LOW (ref 80.0–100.0)
Monocytes Absolute: 1.2 10*3/uL — ABNORMAL HIGH (ref 0.1–1.0)
Monocytes Relative: 20 %
Neutro Abs: 3.9 10*3/uL (ref 1.7–7.7)
Neutrophils Relative %: 65 %
Platelet Count: 469 10*3/uL — ABNORMAL HIGH (ref 150–400)
RBC: 4.48 MIL/uL (ref 4.22–5.81)
RDW: 18.2 % — ABNORMAL HIGH (ref 11.5–15.5)
WBC Count: 5.9 10*3/uL (ref 4.0–10.5)
nRBC: 0 % (ref 0.0–0.2)

## 2020-09-07 LAB — CMP (CANCER CENTER ONLY)
ALT: 150 U/L — ABNORMAL HIGH (ref 0–44)
AST: 34 U/L (ref 15–41)
Albumin: 3.1 g/dL — ABNORMAL LOW (ref 3.5–5.0)
Alkaline Phosphatase: 266 U/L — ABNORMAL HIGH (ref 38–126)
Anion gap: 12 (ref 5–15)
BUN: 11 mg/dL (ref 6–20)
CO2: 26 mmol/L (ref 22–32)
Calcium: 9.4 mg/dL (ref 8.9–10.3)
Chloride: 94 mmol/L — ABNORMAL LOW (ref 98–111)
Creatinine: 0.67 mg/dL (ref 0.61–1.24)
GFR, Estimated: >60 mL/min (ref >60)
Glucose, Bld: 115 mg/dL — ABNORMAL HIGH (ref 70–99)
Potassium: 3.8 mmol/L (ref 3.5–5.1)
Sodium: 132 mmol/L — ABNORMAL LOW (ref 135–145)
Total Bilirubin: 0.5 mg/dL (ref 0.3–1.2)
Total Protein: 7 g/dL (ref 6.5–8.1)

## 2020-09-07 LAB — URIC ACID: Uric Acid, Serum: 4 mg/dL (ref 3.7–8.6)

## 2020-09-07 LAB — MAGNESIUM: Magnesium: 1.7 mg/dL (ref 1.7–2.4)

## 2020-09-07 MED ORDER — DOXYCYCLINE HYCLATE 100 MG PO TABS
100.0000 mg | ORAL_TABLET | Freq: Two times a day (BID) | ORAL | 0 refills | Status: DC
  Filled 2020-09-07: qty 60, 30d supply, fill #0

## 2020-09-07 MED ORDER — SODIUM CHLORIDE 0.9 % IV SOLN
Freq: Once | INTRAVENOUS | Status: AC
  Filled 2020-09-07: qty 250

## 2020-09-07 MED ORDER — DIPHENHYDRAMINE HCL 50 MG/ML IJ SOLN: 25.0000 mg | Freq: Once | INTRAMUSCULAR | Status: AC

## 2020-09-07 MED ORDER — METHYLPREDNISOLONE SODIUM SUCC 125 MG IJ SOLR
125.0000 mg | Freq: Once | INTRAMUSCULAR | Status: AC
  Administered 2020-09-07: 125 mg via INTRAVENOUS

## 2020-09-07 MED ORDER — SODIUM CHLORIDE 0.9% FLUSH
10.0000 mL | INTRAVENOUS | Status: DC | PRN
  Administered 2020-09-07: 10 mL
  Filled 2020-09-07: qty 10

## 2020-09-07 MED ORDER — EMPTY CONTAINERS FLEXIBLE MISC
250.0000 mg/m2 | Freq: Once | Status: AC
  Administered 2020-09-07: 500 mg via INTRAVENOUS
  Filled 2020-09-07: qty 200

## 2020-09-07 MED ORDER — HEPARIN SOD (PORK) LOCK FLUSH 100 UNIT/ML IV SOLN
500.0000 [IU] | Freq: Once | INTRAVENOUS | Status: AC | PRN
  Administered 2020-09-07: 500 [IU]
  Filled 2020-09-07: qty 5

## 2020-09-07 MED ORDER — FLUCONAZOLE 100 MG PO TABS
100.0000 mg | ORAL_TABLET | Freq: Every day | ORAL | 0 refills | Status: DC
  Filled 2020-09-07: qty 7, 7d supply, fill #0

## 2020-09-07 MED ORDER — FAMOTIDINE 20 MG IN NS 100 ML IVPB
20.0000 mg | Freq: Once | INTRAVENOUS | Status: AC
  Administered 2020-09-07: 20 mg via INTRAVENOUS
  Filled 2020-09-07: qty 100

## 2020-09-07 MED ORDER — METHYLPREDNISOLONE SODIUM SUCC 125 MG IJ SOLR
INTRAMUSCULAR | Status: AC
  Filled 2020-09-07: qty 2

## 2020-09-07 MED ORDER — DIPHENHYDRAMINE HCL 50 MG/ML IJ SOLN
INTRAMUSCULAR | Status: AC
  Administered 2020-09-07: 25 mg via INTRAVENOUS
  Filled 2020-09-07: qty 1

## 2020-09-07 MED ORDER — SODIUM CHLORIDE 0.9% FLUSH
10.0000 mL | Freq: Once | INTRAVENOUS | Status: AC
  Administered 2020-09-07: 10 mL
  Filled 2020-09-07: qty 10

## 2020-09-07 NOTE — Progress Notes (Signed)
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 likely represent positive response to treatment The site of his submandibular region is less swollen However, he has lost a lot of weight I will reduce his treatment dose accordingly  Thrush, oral He has recurrent oral thrush again I renewed his prescription fluconazole  Drug-induced skin rash He has acneform rash due to cetuximab I recommend a course of doxycycline  Weight loss, abnormal He has profound abnormal weight loss which he attributed to the unpleasant taste from the oral discharge We discussed the risk and benefits of feeding tube placement and the patient ultimately agreed to try to eat more and start losing weight by his next visit  Elevated liver enzymes This is due to his recent chemotherapy Observe closely for now  No orders of the defined types were placed in this encounter.   All questions were answered. The patient knows to call the clinic with any problems, questions or concerns. The total time spent in the appointment was 30 minutes encounter with patients including review of chart and various tests results, discussions about plan of care and coordination of care plan   Heath Lark, MD 09/07/2020 6:22 PM  INTERVAL HISTORY: Please see below for problem oriented charting. 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   09/06/2011 Surgery   He underwent right neck dissection which showed 1/24 LN involved and repeat resection of tongue was negative   03/23/2012 Surgery   Repeat tngue resection showed well differentiated SCC 1.5 cm which invaded into the muscle   04/17/2012 Imaging   PET/CT scan showed New adenopathy in the left neck, compatible with recurrent malignancy   04/23/2012 Surgery   he underwent left neck LN dissection and 2/34 LN were positve   06/01/2012 - 07/13/2012 Chemotherapy   Patient had 3 cycles of cisplatin   06/12/2012 - 07/14/2012 Radiation Therapy   Patient completed RT   12/10/2012 Imaging   Interval resolution of previous hypermetabolic cervical adenopathy. There is nonspecific asymmetric increased uptake within the right side of tongue   03/13/2013 Imaging   PET/CT scan showed no evidence of disease recurrence. He was noted to have incidental kidney stones   08/15/2014 Imaging   CT scan of the dissection no evidence of disease   05/02/2020 Imaging   CT scan of neck 1. 4.3 x 2.7 x 2.7 cm peripherally enhancing mass at the anterior tongue base with focal sclerosis of the anterior genu of the mandible. 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 2. 8 mm peripherally enhancing left level 2 lymph node is concerning for metastatic disease. Recommend PET scan of the neck to further characterize disease. 3. No other significant recurrent left-sided lymph nodes. 4. Post radiation changes of the carotid  sheath bilaterally. 5. Post radiation changes at the lung apices bilaterally. 6. Polyps or mucous retention cysts within the maxillary sinuses bilaterally.   05/24/2020 Pathology Results   A. SUBMENTAL MASS, MIDLINE, NEEDLE CORE BIOPSY:  - Poorly differentiated  sarcomatoid malignancy.   COMMENT:   CK7 is positive.  CKAE1/AE3, CK8/18, CK20, p40, CK5/6, S-100, Melan-A and Desmin are negative.  SMA demonstrates nonspecific staining.  Ki-67 proliferation index is high.  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     05/24/2020 Procedure   Successful ultrasound submental mass 18 gauge core biopsy   06/07/2020 PET scan   IMPRESSION: 1. Unfortunately evidence of squamous cell carcinoma recurrence in the floor of the mouth. Broad lesion with intense peripheral metabolic activity in the anterior RIGHT floor the mouth. 2. Bilateral hypermetabolic nodules within the sub submandibular which are new from PET-CT 2015. Concern for unusual metastasis to the submandibular glands. 3. New small bilateral small pulmonary nodules with faint radiotracer activity. These are also concerning for metastasis. 4. Asymmetric hypermetabolic activity within the RIGHT testicle. Favor benign inflammation; however consider testicular ultrasound if concern for unusual pattern of metastasis.   06/15/2020 Procedure   Successful placement of a right internal jugular approach power injectable Port-A-Cath. The catheter is ready for immediate use.     06/19/2020 - 08/05/2020 Chemotherapy          08/21/2020 PET scan   1. Progressive tumor in the floor the mouth with overt permeative destructive changes involving the mandible. 2. Persistent hypermetabolic neck nodes as detailed above. No new findings. 3. Progressive pulmonary metastatic disease. 4. No findings for abdominal/pelvic metastatic disease. 5. Persistent hypermetabolism in the right hemiscrotum most likely due to a varicocele.     08/29/2020 -  Chemotherapy    Patient is on Treatment Plan: HEAD/NECK CISPLATIN + DOCETAXEL Q21D PLUS CETUXIMAB    Patient is on Antibody Plan: HEAD/NECK CETUXIMAB Q7D     08/31/2020 -  Chemotherapy    Patient is on Treatment Plan: HEAD/NECK  CISPLATIN + DOCETAXEL Q21D PLUS CETUXIMAB    Patient is on Antibody Plan: HEAD/NECK CETUXIMAB Q7D     Metastasis to lung ([place])  06/09/2020 Initial Diagnosis   Metastasis to lung ([place])   08/31/2020 -  Chemotherapy    Patient is on Treatment Plan: HEAD/NECK CISPLATIN + DOCETAXEL Q21D PLUS CETUXIMAB    Patient is on Antibody Plan: HEAD/NECK CETUXIMAB Q7D       REVIEW OF SYSTEMS:   Constitutional: Denies fevers, chills  Eyes: Denies blurriness of vision Ears, nose, mouth, throat, and face: Denies mucositis or sore throat Respiratory: Denies cough, dyspnea or wheezes Cardiovascular: Denies palpitation, chest discomfort or lower extremity swelling Lymphatics: Denies new lymphadenopathy or easy bruising Neurological:Denies numbness, tingling or new weaknesses Behavioral/Psych: Mood is stable, no new changes  All other systems were reviewed with the patient and are negative.  I have reviewed the past medical history, past surgical history, social history and family history with the patient and they are unchanged from previous note.  ALLERGIES:  is allergic to cetuximab.  MEDICATIONS:  Current Outpatient Medications  Medication Sig Dispense Refill   doxycycline (VIBRA-TABS) 100 MG tablet Take 1 tablet (100 mg total) by mouth 2 (two) times daily. 60 tablet 0   fluconazole (DIFLUCAN) 100 MG tablet Take 1 tablet (100 mg total) by mouth daily. 7 tablet 0   loratadine (CLARITIN) 10 MG tablet Take 10 mg by mouth daily as needed for allergies.  magic mouthwash (nystatin, lidocaine, diphenhydrAMINE, alum & mag hydroxide) suspension Swish and spit 5 mls by mouth 4 times a day as needed 240 mL 0   morphine (MS CONTIN) 60 MG 12 hr tablet Take 1 tablet (60 mg total) by mouth in the morning, at noon, and at bedtime. 90 tablet 0   morphine (MSIR) 30 MG tablet Take 1 tablet (30 mg total) by mouth every 4 (four) hours as needed for severe pain. 90 tablet 0   predniSONE (DELTASONE) 10 MG tablet Take 1  tablet (10 mg total) by mouth daily with breakfast. 30 tablet 2   prochlorperazine (COMPAZINE) 10 MG tablet Take 1 tablet (10 mg total) by mouth every 6 (six) hours as needed for nausea or vomiting. 30 tablet 3   No current facility-administered medications for this visit.    PHYSICAL EXAMINATION: ECOG PERFORMANCE STATUS: 1 - Symptomatic but completely ambulatory  Vitals:   09/07/20 0804  BP: 112/76  Pulse: (!) 115  Resp: 18  Temp: 98.7 F (37.1 C)  SpO2: 100%   Filed Weights   09/07/20 0804  Weight: 188 lb 6.4 oz (85.5 kg)    GENERAL:alert, no distress and comfortable SKIN: He has acneform rash consistent with cetuximab toxicity EYES: normal, Conjunctiva are pink and non-injected, sclera clear OROPHARYNX: Noted oral thrush.  Unpleasant odor emanating from his oral cavity. NECK: supple, thyroid normal size, non-tender, without nodularity LYMPH:  no palpable lymphadenopathy in the cervical, axillary or inguinal LUNGS: clear to auscultation and percussion with normal breathing effort HEART: regular rate & rhythm and no murmurs and no lower extremity edema ABDOMEN:abdomen soft, non-tender and normal bowel sounds Musculoskeletal:no cyanosis of digits and no clubbing  NEURO: alert & oriented x 3 with fluent speech, no focal motor/sensory deficits  LABORATORY DATA:  I have reviewed the data as listed    Component Value Date/Time   NA 132 (L) 09/07/2020 0751   NA 141 04/29/2016 1402   K 3.8 09/07/2020 0751   K 4.1 04/29/2016 1402   CL 94 (L) 09/07/2020 0751   CL 100 07/10/2012 1341   CO2 26 09/07/2020 0751   CO2 25 04/29/2016 1402   GLUCOSE 115 (H) 09/07/2020 0751   GLUCOSE 91 04/29/2016 1402   GLUCOSE 118 (H) 07/10/2012 1341   BUN 11 09/07/2020 0751   BUN 9.7 04/29/2016 1402   CREATININE 0.67 09/07/2020 0751   CREATININE 1.0 04/29/2016 1402   CALCIUM 9.4 09/07/2020 0751   CALCIUM 9.3 04/29/2016 1402   PROT 7.0 09/07/2020 0751   PROT 7.0 04/29/2016 1402   ALBUMIN  3.1 (L) 09/07/2020 0751   ALBUMIN 4.1 04/29/2016 1402   AST 34 09/07/2020 0751   AST 28 04/29/2016 1402   ALT 150 (H) 09/07/2020 0751   ALT 26 04/29/2016 1402   ALKPHOS 266 (H) 09/07/2020 0751   ALKPHOS 71 04/29/2016 1402   BILITOT 0.5 09/07/2020 0751   BILITOT 0.43 04/29/2016 1402   GFRNONAA >60 09/07/2020 0751   GFRAA >60 05/02/2017 0901    No results found for: SPEP, UPEP  Lab Results  Component Value Date   WBC 5.9 09/07/2020   NEUTROABS 3.9 09/07/2020   HGB 11.9 (L) 09/07/2020   HCT 35.3 (L) 09/07/2020   MCV 78.8 (L) 09/07/2020   PLT 469 (H) 09/07/2020      Chemistry      Component Value Date/Time   NA 132 (L) 09/07/2020 0751   NA 141 04/29/2016 1402   K 3.8 09/07/2020 0751  K 4.1 04/29/2016 1402   CL 94 (L) 09/07/2020 0751   CL 100 07/10/2012 1341   CO2 26 09/07/2020 0751   CO2 25 04/29/2016 1402   BUN 11 09/07/2020 0751   BUN 9.7 04/29/2016 1402   CREATININE 0.67 09/07/2020 0751   CREATININE 1.0 04/29/2016 1402      Component Value Date/Time   CALCIUM 9.4 09/07/2020 0751   CALCIUM 9.3 04/29/2016 1402   ALKPHOS 266 (H) 09/07/2020 0751   ALKPHOS 71 04/29/2016 1402   AST 34 09/07/2020 0751   AST 28 04/29/2016 1402   ALT 150 (H) 09/07/2020 0751   ALT 26 04/29/2016 1402   BILITOT 0.5 09/07/2020 0751   BILITOT 0.43 04/29/2016 1402

## 2020-09-07 NOTE — Assessment & Plan Note (Signed)
He has profound abnormal weight loss which he attributed to the unpleasant taste from the oral discharge We discussed the risk and benefits of feeding tube placement and the patient ultimately agreed to try to eat more and start losing weight by his next visit

## 2020-09-07 NOTE — Assessment & Plan Note (Signed)
This is due to his recent chemotherapy Observe closely for now

## 2020-09-07 NOTE — Assessment & Plan Note (Signed)
He has acneform rash due to cetuximab I recommend a course of doxycycline

## 2020-09-07 NOTE — Assessment & Plan Note (Signed)
He has recurrent oral thrush again I renewed his prescription fluconazole

## 2020-09-07 NOTE — Assessment & Plan Note (Signed)
He had recent necrotic discharge from under his chin likely represent positive response to treatment The site of his submandibular region is less swollen However, he has lost a lot of weight I will reduce his treatment dose accordingly

## 2020-09-07 NOTE — Progress Notes (Signed)
Okay to treat with tachycardia and elevated LFT's per Dr. Alvy Bimler

## 2020-09-07 NOTE — Patient Instructions (Signed)
[facility]  Discharge Instructions: Thank you for choosing [facility] to provide your oncology and hematology care.   If you have a lab appointment with the [facility], please go directly to the [facility] and check in at the registration area.   Wear comfortable clothing and clothing appropriate for easy access to any Portacath or PICC line.   We strive to give you quality time with your provider. You may need to reschedule your appointment if you arrive late (15 or more minutes).  Arriving late affects you and other patients whose appointments are after yours.  Also, if you miss three or more appointments without notifying the office, you may be dismissed from the clinic at the provider's discretion.      For prescription refill requests, have your pharmacy contact our office and allow 72 hours for refills to be completed.    Today you received the following chemotherapy and/or immunotherapy agents : Cetuximab     To help prevent nausea and vomiting after your treatment, we encourage you to take your nausea medication as directed.  BELOW ARE SYMPTOMS THAT SHOULD BE REPORTED IMMEDIATELY: *FEVER GREATER THAN 100.4 F (38 C) OR HIGHER *CHILLS OR SWEATING *NAUSEA AND VOMITING THAT IS NOT CONTROLLED WITH YOUR NAUSEA MEDICATION *UNUSUAL SHORTNESS OF BREATH *UNUSUAL BRUISING OR BLEEDING *URINARY PROBLEMS (pain or burning when urinating, or frequent urination) *BOWEL PROBLEMS (unusual diarrhea, constipation, pain near the anus) TENDERNESS IN MOUTH AND THROAT WITH OR WITHOUT PRESENCE OF ULCERS (sore throat, sores in mouth, or a toothache) UNUSUAL RASH, SWELLING OR PAIN  UNUSUAL VAGINAL DISCHARGE OR ITCHING   Items with * indicate a potential emergency and should be followed up as soon as possible or go to the Emergency Department if any problems should occur.  Please show the CHEMOTHERAPY ALERT CARD or IMMUNOTHERAPY ALERT CARD at check-in to  the Emergency Department and triage nurse.  Should you have questions after your visit or need to cancel or reschedule your appointment, please contact [facility]  Dept: [phone]  and follow the prompts.  Office hours are 8:00 a.m. to 4:30 p.m. Monday - Friday. Please note that voicemails left after 4:00 p.m. may not be returned until the following business day.  We are closed weekends and major holidays. You have access to a nurse at all times for urgent questions. Please call the main number to the clinic Dept: [phone] and follow the prompts.   For any non-urgent questions, you may also contact your provider using MyChart. We now offer e-Visits for anyone 75 and older to request care online for non-urgent symptoms. For details visit [email or web address].   Also download the MyChart app! Go to the app store, search "MyChart", open the app, select [facility], and log in with your MyChart username and password.  Due to Covid, a mask is required upon entering the hospital/clinic. If you do not have a mask, one will be given to you upon arrival. For doctor visits, patients may have 1 support person aged 43 or older with them. For treatment visits, patients cannot have anyone with them due to current Covid guidelines and our immunocompromised population.

## 2020-09-14 ENCOUNTER — Other Ambulatory Visit: Payer: Self-pay

## 2020-09-14 ENCOUNTER — Inpatient Hospital Stay: Payer: BC Managed Care – PPO

## 2020-09-14 ENCOUNTER — Encounter: Payer: Self-pay | Admitting: Hematology and Oncology

## 2020-09-14 ENCOUNTER — Inpatient Hospital Stay (HOSPITAL_BASED_OUTPATIENT_CLINIC_OR_DEPARTMENT_OTHER): Payer: BC Managed Care – PPO | Admitting: Hematology and Oncology

## 2020-09-14 VITALS — HR 85

## 2020-09-14 DIAGNOSIS — C029 Malignant neoplasm of tongue, unspecified: Secondary | ICD-10-CM

## 2020-09-14 DIAGNOSIS — Z5112 Encounter for antineoplastic immunotherapy: Secondary | ICD-10-CM | POA: Diagnosis not present

## 2020-09-14 DIAGNOSIS — L270 Generalized skin eruption due to drugs and medicaments taken internally: Secondary | ICD-10-CM | POA: Diagnosis not present

## 2020-09-14 DIAGNOSIS — G893 Neoplasm related pain (acute) (chronic): Secondary | ICD-10-CM | POA: Diagnosis not present

## 2020-09-14 DIAGNOSIS — C7802 Secondary malignant neoplasm of left lung: Secondary | ICD-10-CM

## 2020-09-14 DIAGNOSIS — C7801 Secondary malignant neoplasm of right lung: Secondary | ICD-10-CM

## 2020-09-14 DIAGNOSIS — R748 Abnormal levels of other serum enzymes: Secondary | ICD-10-CM

## 2020-09-14 LAB — CBC WITH DIFFERENTIAL (CANCER CENTER ONLY)
Abs Immature Granulocytes: 0.35 10*3/uL — ABNORMAL HIGH (ref 0.00–0.07)
Basophils Absolute: 0.1 10*3/uL (ref 0.0–0.1)
Basophils Relative: 1 %
Eosinophils Absolute: 0 10*3/uL (ref 0.0–0.5)
Eosinophils Relative: 0 %
HCT: 34.2 % — ABNORMAL LOW (ref 39.0–52.0)
Hemoglobin: 11.2 g/dL — ABNORMAL LOW (ref 13.0–17.0)
Immature Granulocytes: 4 %
Lymphocytes Relative: 5 %
Lymphs Abs: 0.4 10*3/uL — ABNORMAL LOW (ref 0.7–4.0)
MCH: 27.2 pg (ref 26.0–34.0)
MCHC: 32.7 g/dL (ref 30.0–36.0)
MCV: 83 fL (ref 80.0–100.0)
Monocytes Absolute: 1.1 10*3/uL — ABNORMAL HIGH (ref 0.1–1.0)
Monocytes Relative: 12 %
Neutro Abs: 6.9 10*3/uL (ref 1.7–7.7)
Neutrophils Relative %: 78 %
Platelet Count: 359 10*3/uL (ref 150–400)
RBC: 4.12 MIL/uL — ABNORMAL LOW (ref 4.22–5.81)
RDW: 19.9 % — ABNORMAL HIGH (ref 11.5–15.5)
WBC Count: 8.8 10*3/uL (ref 4.0–10.5)
nRBC: 0 % (ref 0.0–0.2)

## 2020-09-14 LAB — CMP (CANCER CENTER ONLY)
ALT: 122 U/L — ABNORMAL HIGH (ref 0–44)
AST: 44 U/L — ABNORMAL HIGH (ref 15–41)
Albumin: 3.3 g/dL — ABNORMAL LOW (ref 3.5–5.0)
Alkaline Phosphatase: 155 U/L — ABNORMAL HIGH (ref 38–126)
Anion gap: 10 (ref 5–15)
BUN: 12 mg/dL (ref 6–20)
CO2: 27 mmol/L (ref 22–32)
Calcium: 9.5 mg/dL (ref 8.9–10.3)
Chloride: 100 mmol/L (ref 98–111)
Creatinine: 0.68 mg/dL (ref 0.61–1.24)
GFR, Estimated: >60 mL/min (ref >60)
Glucose, Bld: 73 mg/dL (ref 70–99)
Potassium: 4.1 mmol/L (ref 3.5–5.1)
Sodium: 137 mmol/L (ref 135–145)
Total Bilirubin: 0.3 mg/dL (ref 0.3–1.2)
Total Protein: 7.1 g/dL (ref 6.5–8.1)

## 2020-09-14 LAB — MAGNESIUM: Magnesium: 1.9 mg/dL (ref 1.7–2.4)

## 2020-09-14 MED ORDER — FAMOTIDINE 20 MG IN NS 100 ML IVPB
20.0000 mg | Freq: Once | INTRAVENOUS | Status: AC
  Administered 2020-09-14: 20 mg via INTRAVENOUS
  Filled 2020-09-14: qty 100

## 2020-09-14 MED ORDER — SODIUM CHLORIDE 0.9% FLUSH
10.0000 mL | Freq: Once | INTRAVENOUS | Status: AC
  Administered 2020-09-14: 10 mL

## 2020-09-14 MED ORDER — SODIUM CHLORIDE 0.9% FLUSH
10.0000 mL | INTRAVENOUS | Status: DC | PRN
  Administered 2020-09-14: 10 mL

## 2020-09-14 MED ORDER — DIPHENHYDRAMINE HCL 50 MG/ML IJ SOLN
25.0000 mg | Freq: Once | INTRAMUSCULAR | Status: AC
  Administered 2020-09-14: 25 mg via INTRAVENOUS
  Filled 2020-09-14: qty 1

## 2020-09-14 MED ORDER — SODIUM CHLORIDE 0.9 % IV SOLN
Freq: Once | INTRAVENOUS | Status: AC
Start: 2020-09-14 — End: 2020-09-14

## 2020-09-14 MED ORDER — METHYLPREDNISOLONE SODIUM SUCC 125 MG IJ SOLR
125.0000 mg | Freq: Once | INTRAMUSCULAR | Status: AC
  Administered 2020-09-14: 125 mg via INTRAVENOUS
  Filled 2020-09-14: qty 2

## 2020-09-14 MED ORDER — HEPARIN SOD (PORK) LOCK FLUSH 100 UNIT/ML IV SOLN
500.0000 [IU] | Freq: Once | INTRAVENOUS | Status: AC | PRN
  Administered 2020-09-14: 500 [IU]

## 2020-09-14 MED ORDER — EMPTY CONTAINERS FLEXIBLE MISC
150.0000 mg/m2 | Freq: Once | Status: AC
  Administered 2020-09-14: 300 mg via INTRAVENOUS
  Filled 2020-09-14: qty 100

## 2020-09-14 NOTE — Progress Notes (Signed)
Per Dr. Alvy Bimler ok to treat with elevated LFTs

## 2020-09-14 NOTE — Patient Instructions (Signed)
[facility]  Discharge Instructions: Thank you for choosing [facility] to provide your oncology and hematology care.   If you have a lab appointment with the [facility], please go directly to the [facility] and check in at the registration area.   Wear comfortable clothing and clothing appropriate for easy access to any Portacath or PICC line.   We strive to give you quality time with your provider. You may need to reschedule your appointment if you arrive late (15 or more minutes).  Arriving late affects you and other patients whose appointments are after yours.  Also, if you miss three or more appointments without notifying the office, you may be dismissed from the clinic at the provider's discretion.      For prescription refill requests, have your pharmacy contact our office and allow 72 hours for refills to be completed.    Today you received the following chemotherapy and/or immunotherapy agents cetuximab      To help prevent nausea and vomiting after your treatment, we encourage you to take your nausea medication as directed.  BELOW ARE SYMPTOMS THAT SHOULD BE REPORTED IMMEDIATELY: *FEVER GREATER THAN 100.4 F (38 C) OR HIGHER *CHILLS OR SWEATING *NAUSEA AND VOMITING THAT IS NOT CONTROLLED WITH YOUR NAUSEA MEDICATION *UNUSUAL SHORTNESS OF BREATH *UNUSUAL BRUISING OR BLEEDING *URINARY PROBLEMS (pain or burning when urinating, or frequent urination) *BOWEL PROBLEMS (unusual diarrhea, constipation, pain near the anus) TENDERNESS IN MOUTH AND THROAT WITH OR WITHOUT PRESENCE OF ULCERS (sore throat, sores in mouth, or a toothache) UNUSUAL RASH, SWELLING OR PAIN  UNUSUAL VAGINAL DISCHARGE OR ITCHING   Items with * indicate a potential emergency and should be followed up as soon as possible or go to the Emergency Department if any problems should occur.  Please show the CHEMOTHERAPY ALERT CARD or IMMUNOTHERAPY ALERT CARD at check-in to  the Emergency Department and triage nurse.  Should you have questions after your visit or need to cancel or reschedule your appointment, please contact [facility]  Dept: [phone]  and follow the prompts.  Office hours are 8:00 a.m. to 4:30 p.m. Monday - Friday. Please note that voicemails left after 4:00 p.m. may not be returned until the following business day.  We are closed weekends and major holidays. You have access to a nurse at all times for urgent questions. Please call the main number to the clinic Dept: [phone] and follow the prompts.   For any non-urgent questions, you may also contact your provider using MyChart. We now offer e-Visits for anyone 57 and older to request care online for non-urgent symptoms. For details visit [email or web address].   Also download the MyChart app! Go to the app store, search "MyChart", open the app, select [facility], and log in with your MyChart username and password.  Due to Covid, a mask is required upon entering the hospital/clinic. If you do not have a mask, one will be given to you upon arrival. For doctor visits, patients may have 1 support person aged 32 or older with them. For treatment visits, patients cannot have anyone with them due to current Covid guidelines and our immunocompromised population.

## 2020-09-14 NOTE — Progress Notes (Signed)
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   All questions were answered. The patient knows to call the clinic with any problems, questions or concerns. The total time spent in the appointment was 30 minutes encounter with patients including review of chart and various tests results, discussions about plan of care and coordination of care plan   Heath Lark, MD 09/14/2020 1:28 PM  INTERVAL HISTORY: Please see below for problem oriented charting. 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   09/06/2011 Surgery   He underwent right neck dissection which showed 1/24 LN involved and repeat resection of tongue was negative   03/23/2012 Surgery   Repeat tngue resection showed well differentiated SCC 1.5 cm which invaded into the muscle   04/17/2012 Imaging   PET/CT scan showed New adenopathy in the left neck, compatible with recurrent malignancy   04/23/2012 Surgery   he underwent left neck LN dissection and 2/34 LN were positve   06/01/2012 - 07/13/2012 Chemotherapy   Patient had 3 cycles of cisplatin   06/12/2012 - 07/14/2012 Radiation Therapy   Patient completed RT   12/10/2012 Imaging   Interval resolution of previous hypermetabolic cervical adenopathy. There is nonspecific asymmetric increased uptake within the right side of tongue   03/13/2013 Imaging   PET/CT scan showed no evidence of disease recurrence. He was noted to have incidental kidney stones   08/15/2014 Imaging   CT scan of the dissection no evidence of disease   05/02/2020 Imaging   CT scan of neck 1. 4.3 x 2.7 x 2.7 cm peripherally enhancing mass at the anterior tongue base with focal sclerosis of the anterior genu of the mandible. 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 2. 8 mm peripherally enhancing left level 2 lymph node is concerning for metastatic disease. Recommend PET scan of the neck to further characterize disease. 3. No other significant recurrent left-sided lymph nodes. 4. Post radiation changes of the carotid sheath bilaterally. 5. Post radiation changes at the lung apices bilaterally.  6. Polyps or mucous retention cysts within the maxillary sinuses bilaterally.   05/24/2020 Pathology Results   A. SUBMENTAL MASS, MIDLINE, NEEDLE CORE BIOPSY:  - Poorly differentiated sarcomatoid malignancy.   COMMENT:   CK7 is positive.  CKAE1/AE3, CK8/18, CK20, p40, CK5/6, S-100, Melan-A  and Desmin are negative.  SMA demonstrates nonspecific staining.  Ki-67 proliferation index is high.  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     05/24/2020 Procedure   Successful ultrasound submental mass 18 gauge core biopsy   06/07/2020 PET scan   IMPRESSION: 1. Unfortunately evidence of squamous cell carcinoma recurrence in the floor of the mouth. Broad lesion with intense peripheral metabolic activity in the anterior RIGHT floor the mouth. 2. Bilateral hypermetabolic nodules within the sub submandibular which are new from PET-CT 2015. Concern for unusual metastasis to the submandibular glands. 3. New small bilateral small pulmonary nodules with faint radiotracer activity. These are also concerning for metastasis. 4. Asymmetric hypermetabolic activity within the RIGHT testicle. Favor benign inflammation; however consider testicular ultrasound if concern for unusual pattern of metastasis.   06/15/2020 Procedure   Successful placement of a right internal jugular approach power injectable Port-A-Cath. The catheter is ready for immediate use.     06/19/2020 - 08/05/2020 Chemotherapy          08/21/2020 PET scan   1. Progressive tumor in the floor the mouth with overt permeative destructive changes involving the mandible. 2. Persistent hypermetabolic neck nodes as detailed above. No new findings. 3. Progressive pulmonary metastatic disease. 4. No findings for abdominal/pelvic metastatic disease. 5. Persistent hypermetabolism in the right hemiscrotum most likely due to a varicocele.     08/29/2020 -  Chemotherapy    Patient is on Treatment Plan: HEAD/NECK CISPLATIN + DOCETAXEL Q21D PLUS CETUXIMAB    Patient is on Antibody Plan: HEAD/NECK CETUXIMAB Q7D     08/31/2020 -  Chemotherapy    Patient is on Treatment Plan: HEAD/NECK CISPLATIN + DOCETAXEL Q21D PLUS CETUXIMAB    Patient is on Antibody Plan: HEAD/NECK CETUXIMAB Q7D      Metastasis to lung ([place])  06/09/2020 Initial Diagnosis   Metastasis to lung ([place])   08/31/2020 -  Chemotherapy    Patient is on Treatment Plan: HEAD/NECK CISPLATIN + DOCETAXEL Q21D PLUS CETUXIMAB    Patient is on Antibody Plan: HEAD/NECK CETUXIMAB Q7D       REVIEW OF SYSTEMS:   Constitutional: Denies fevers, chills or abnormal weight loss Eyes: Denies blurriness of vision Ears, nose, mouth, throat, and face: Denies mucositis or sore throat Respiratory: Denies cough, dyspnea or wheezes Cardiovascular: Denies palpitation, chest discomfort or lower extremity swelling Gastrointestinal:  Denies nausea, heartburn or change in bowel habits Lymphatics: Denies new lymphadenopathy or easy bruising Neurological:Denies numbness, tingling or new weaknesses Behavioral/Psych: Mood is stable, no new changes  All other systems were reviewed with the patient and are negative.  I have reviewed the past medical history, past surgical history, social history and family history with the patient and they are unchanged from previous note.  ALLERGIES:  is allergic to cetuximab.  MEDICATIONS:  Current Outpatient Medications  Medication Sig Dispense Refill   polyethylene glycol (MIRALAX / GLYCOLAX) 17 g packet Take 17 g by mouth daily.     senna (SENOKOT) 8.6 MG tablet Take 2 tablets by mouth 2 (two) times daily.     doxycycline (VIBRA-TABS) 100 MG tablet Take 1 tablet (100 mg total) by mouth 2 (two) times daily. 60 tablet  0   fluconazole (DIFLUCAN) 100 MG tablet Take 1 tablet (100 mg total) by mouth daily. 7 tablet 0   loratadine (CLARITIN) 10 MG tablet Take 10 mg by mouth daily as needed for allergies.     magic mouthwash (nystatin, lidocaine, diphenhydrAMINE, alum & mag hydroxide) suspension Swish and spit 5 mls by mouth 4 times a day as needed 240 mL 0   morphine (MS CONTIN) 60 MG 12 hr tablet Take 1 tablet (60 mg total) by mouth in the morning, at noon, and at bedtime. 90 tablet 0   morphine (MSIR) 30  MG tablet Take 1 tablet (30 mg total) by mouth every 4 (four) hours as needed for severe pain. 90 tablet 0   predniSONE (DELTASONE) 10 MG tablet Take 1 tablet (10 mg total) by mouth daily with breakfast. 30 tablet 2   prochlorperazine (COMPAZINE) 10 MG tablet Take 1 tablet (10 mg total) by mouth every 6 (six) hours as needed for nausea or vomiting. 30 tablet 3   No current facility-administered medications for this visit.   Facility-Administered Medications Ordered in Other Visits  Medication Dose Route Frequency Provider Last Rate Last Admin   sodium chloride flush (NS) 0.9 % injection 10 mL  10 mL Intracatheter PRN Alvy Bimler, Kimbrely Buckel, MD   10 mL at 09/14/20 1133    PHYSICAL EXAMINATION: ECOG PERFORMANCE STATUS: 2 - Symptomatic, <50% confined to bed  Vitals:   09/14/20 0811  BP: 106/68  Pulse: (!) 106  Resp: 18  Temp: 98 F (36.7 C)  SpO2: 100%   Filed Weights   09/14/20 0811  Weight: 193 lb (87.5 kg)    GENERAL:alert, no distress and comfortable SKIN: Noted significant acneform rash consistent with cetuximab induced side effects EYES: normal, Conjunctiva are pink and non-injected, sclera clear OROPHARYNX: Oral thrush has resolved.  The tumor at the base of his tongue is stable in size NECK: supple, thyroid normal size, non-tender, without nodularity LYMPH:  no palpable lymphadenopathy in the cervical, axillary or inguinal LUNGS: clear to auscultation and percussion with normal breathing effort HEART: regular rate & rhythm and no murmurs and no lower extremity edema ABDOMEN:abdomen soft, non-tender and normal bowel sounds Musculoskeletal:no cyanosis of digits and no clubbing  NEURO: alert & oriented x 3 with fluent speech, no focal motor/sensory deficits  LABORATORY DATA:  I have reviewed the data as listed    Component Value Date/Time   NA 137 09/14/2020 0743   NA 141 04/29/2016 1402   K 4.1 09/14/2020 0743   K 4.1 04/29/2016 1402   CL 100 09/14/2020 0743   CL 100  07/10/2012 1341   CO2 27 09/14/2020 0743   CO2 25 04/29/2016 1402   GLUCOSE 73 09/14/2020 0743   GLUCOSE 91 04/29/2016 1402   GLUCOSE 118 (H) 07/10/2012 1341   BUN 12 09/14/2020 0743   BUN 9.7 04/29/2016 1402   CREATININE 0.68 09/14/2020 0743   CREATININE 1.0 04/29/2016 1402   CALCIUM 9.5 09/14/2020 0743   CALCIUM 9.3 04/29/2016 1402   PROT 7.1 09/14/2020 0743   PROT 7.0 04/29/2016 1402   ALBUMIN 3.3 (L) 09/14/2020 0743   ALBUMIN 4.1 04/29/2016 1402   AST 44 (H) 09/14/2020 0743   AST 28 04/29/2016 1402   ALT 122 (H) 09/14/2020 0743   ALT 26 04/29/2016 1402   ALKPHOS 155 (H) 09/14/2020 0743   ALKPHOS 71 04/29/2016 1402   BILITOT 0.3 09/14/2020 0743   BILITOT 0.43 04/29/2016 1402   GFRNONAA >60 09/14/2020 1601  GFRAA >60 05/02/2017 0901    No results found for: SPEP, UPEP  Lab Results  Component Value Date   WBC 8.8 09/14/2020   NEUTROABS 6.9 09/14/2020   HGB 11.2 (L) 09/14/2020   HCT 34.2 (L) 09/14/2020   MCV 83.0 09/14/2020   PLT 359 09/14/2020      Chemistry      Component Value Date/Time   NA 137 09/14/2020 0743   NA 141 04/29/2016 1402   K 4.1 09/14/2020 0743   K 4.1 04/29/2016 1402   CL 100 09/14/2020 0743   CL 100 07/10/2012 1341   CO2 27 09/14/2020 0743   CO2 25 04/29/2016 1402   BUN 12 09/14/2020 0743   BUN 9.7 04/29/2016 1402   CREATININE 0.68 09/14/2020 0743   CREATININE 1.0 04/29/2016 1402      Component Value Date/Time   CALCIUM 9.5 09/14/2020 0743   CALCIUM 9.3 04/29/2016 1402   ALKPHOS 155 (H) 09/14/2020 0743   ALKPHOS 71 04/29/2016 1402   AST 44 (H) 09/14/2020 0743   AST 28 04/29/2016 1402   ALT 122 (H) 09/14/2020 0743   ALT 26 04/29/2016 1402   BILITOT 0.3 09/14/2020 0743   BILITOT 0.43 04/29/2016 1402

## 2020-09-14 NOTE — Assessment & Plan Note (Signed)
He is recovering well from treatment except for persistent elevated LFTs Oral thrush has resolved He still have significant cetuximab induced skin rash Plan to reduce the dose of cetuximab moving forward I will also reduce the dose of Taxotere for next cycle of therapy

## 2020-09-14 NOTE — Assessment & Plan Note (Signed)
This is due to side effects of chemo Plan to reduce the dose of Taxotere as above

## 2020-09-14 NOTE — Assessment & Plan Note (Signed)
His pain is well-controlled He will continue current prescribed pain medicine 

## 2020-09-14 NOTE — Assessment & Plan Note (Signed)
He will continue doxycycline for now I plan to reduce the dose of cetuximab

## 2020-09-19 ENCOUNTER — Other Ambulatory Visit: Payer: Self-pay

## 2020-09-19 ENCOUNTER — Inpatient Hospital Stay: Payer: BC Managed Care – PPO

## 2020-09-19 ENCOUNTER — Other Ambulatory Visit: Payer: Self-pay | Admitting: Hematology and Oncology

## 2020-09-19 VITALS — BP 115/86 | HR 93 | Temp 97.8°F | Resp 18 | Wt 194.2 lb

## 2020-09-19 DIAGNOSIS — C029 Malignant neoplasm of tongue, unspecified: Secondary | ICD-10-CM

## 2020-09-19 DIAGNOSIS — Z5112 Encounter for antineoplastic immunotherapy: Secondary | ICD-10-CM | POA: Diagnosis not present

## 2020-09-19 MED ORDER — SODIUM CHLORIDE 0.9% FLUSH
10.0000 mL | INTRAVENOUS | Status: DC | PRN
  Administered 2020-09-19: 10 mL

## 2020-09-19 MED ORDER — MAGNESIUM SULFATE 2 GM/50ML IV SOLN
2.0000 g | Freq: Once | INTRAVENOUS | Status: AC
  Administered 2020-09-19: 2 g via INTRAVENOUS
  Filled 2020-09-19: qty 50

## 2020-09-19 MED ORDER — DOCETAXEL CHEMO INJECTION 160 MG/16ML
60.0000 mg/m2 | Freq: Once | INTRAVENOUS | Status: AC
  Administered 2020-09-19: 130 mg via INTRAVENOUS
  Filled 2020-09-19: qty 13

## 2020-09-19 MED ORDER — SODIUM CHLORIDE 0.9 % IV SOLN
10.0000 mg | Freq: Once | INTRAVENOUS | Status: AC
  Administered 2020-09-19: 10 mg via INTRAVENOUS
  Filled 2020-09-19: qty 10

## 2020-09-19 MED ORDER — PALONOSETRON HCL INJECTION 0.25 MG/5ML
0.2500 mg | Freq: Once | INTRAVENOUS | Status: AC
  Administered 2020-09-19: 0.25 mg via INTRAVENOUS
  Filled 2020-09-19: qty 5

## 2020-09-19 MED ORDER — SODIUM CHLORIDE 0.9 % IV SOLN
150.0000 mg | Freq: Once | INTRAVENOUS | Status: AC
  Administered 2020-09-19: 150 mg via INTRAVENOUS
  Filled 2020-09-19: qty 150

## 2020-09-19 MED ORDER — SODIUM CHLORIDE 0.9 % IV SOLN
75.0000 mg/m2 | Freq: Once | INTRAVENOUS | Status: AC
  Administered 2020-09-19: 164 mg via INTRAVENOUS
  Filled 2020-09-19: qty 164

## 2020-09-19 MED ORDER — HEPARIN SOD (PORK) LOCK FLUSH 100 UNIT/ML IV SOLN
500.0000 [IU] | Freq: Once | INTRAVENOUS | Status: AC | PRN
  Administered 2020-09-19: 500 [IU]

## 2020-09-19 MED ORDER — SODIUM CHLORIDE 0.9 % IV SOLN
Freq: Once | INTRAVENOUS | Status: AC
Start: 2020-09-19 — End: 2020-09-19

## 2020-09-19 MED ORDER — POTASSIUM CHLORIDE IN NACL 20-0.9 MEQ/L-% IV SOLN
Freq: Once | INTRAVENOUS | Status: AC
  Filled 2020-09-19: qty 1000

## 2020-09-19 NOTE — Progress Notes (Signed)
Continue reduced dose Taxotere ('60mg'$ /m2) - per MD.  Acquanetta Belling, RPH, BCPS, BCOP 09/19/2020 9:25 AM

## 2020-09-19 NOTE — Patient Instructions (Signed)
[facility]  Discharge Instructions: Thank you for choosing [facility] to provide your oncology and hematology care.   If you have a lab appointment with the [facility], please go directly to the [facility] and check in at the registration area.   Wear comfortable clothing and clothing appropriate for easy access to any Portacath or PICC line.   We strive to give you quality time with your provider. You may need to reschedule your appointment if you arrive late (15 or more minutes).  Arriving late affects you and other patients whose appointments are after yours.  Also, if you miss three or more appointments without notifying the office, you may be dismissed from the clinic at the provider's discretion.      For prescription refill requests, have your pharmacy contact our office and allow 72 hours for refills to be completed.    Today you received the following chemotherapy and/or immunotherapy agents: Docetaxol, Cisplatin   To help prevent nausea and vomiting after your treatment, we encourage you to take your nausea medication as directed.  BELOW ARE SYMPTOMS THAT SHOULD BE REPORTED IMMEDIATELY: *FEVER GREATER THAN 100.4 F (38 C) OR HIGHER *CHILLS OR SWEATING *NAUSEA AND VOMITING THAT IS NOT CONTROLLED WITH YOUR NAUSEA MEDICATION *UNUSUAL SHORTNESS OF BREATH *UNUSUAL BRUISING OR BLEEDING *URINARY PROBLEMS (pain or burning when urinating, or frequent urination) *BOWEL PROBLEMS (unusual diarrhea, constipation, pain near the anus) TENDERNESS IN MOUTH AND THROAT WITH OR WITHOUT PRESENCE OF ULCERS (sore throat, sores in mouth, or a toothache) UNUSUAL RASH, SWELLING OR PAIN  UNUSUAL VAGINAL DISCHARGE OR ITCHING   Items with * indicate a potential emergency and should be followed up as soon as possible or go to the Emergency Department if any problems should occur.  Please show the CHEMOTHERAPY ALERT CARD or IMMUNOTHERAPY ALERT CARD at  check-in to the Emergency Department and triage nurse.  Should you have questions after your visit or need to cancel or reschedule your appointment, please contact [facility]  Dept: [phone]  and follow the prompts.  Office hours are 8:00 a.m. to 4:30 p.m. Monday - Friday. Please note that voicemails left after 4:00 p.m. may not be returned until the following business day.  We are closed weekends and major holidays. You have access to a nurse at all times for urgent questions. Please call the main number to the clinic Dept: [phone] and follow the prompts.   For any non-urgent questions, you may also contact your provider using MyChart. We now offer e-Visits for anyone 51 and older to request care online for non-urgent symptoms. For details visit [email or web address].   Also download the MyChart app! Go to the app store, search "MyChart", open the app, select [facility], and log in with your MyChart username and password.  Due to Covid, a mask is required upon entering the hospital/clinic. If you do not have a mask, one will be given to you upon arrival. For doctor visits, patients may have 1 support person aged 66 or older with them. For treatment visits, patients cannot have anyone with them due to current Covid guidelines and our immunocompromised population.   Docetaxel injection What is this medication? DOCETAXEL (doe se TAX el) is a chemotherapy drug. It targets fast dividing cells, like cancer cells, and causes these cells to die. This medicine is used to treat many types of cancers like breast cancer, certain stomach cancers,head and neck cancer, lung cancer, and  prostate cancer. This medicine may be used for other purposes; ask your health care provider orpharmacist if you have questions. COMMON BRAND NAME(S): Docefrez, Taxotere What should I tell my care team before I take this medication? They need to know if you have any of these  conditions: infection (especially a virus infection such as chickenpox, cold sores, or herpes) liver disease low blood counts, like low white cell, platelet, or red cell counts an unusual or allergic reaction to docetaxel, polysorbate 80, other chemotherapy agents, other medicines, foods, dyes, or preservatives pregnant or trying to get pregnant breast-feeding How should I use this medication? This drug is given as an infusion into a vein. It is administered in a hospitalor clinic by a specially trained health care professional. Talk to your pediatrician regarding the use of this medicine in children.Special care may be needed. Overdosage: If you think you have taken too much of this medicine contact apoison control center or emergency room at once. NOTE: This medicine is only for you. Do not share this medicine with others. What if I miss a dose? It is important not to miss your dose. Call your doctor or health careprofessional if you are unable to keep an appointment. What may interact with this medication? Do not take this medicine with any of the following medications: live virus vaccines This medicine may also interact with the following medications: aprepitant certain antibiotics like erythromycin or clarithromycin certain antivirals for HIV or hepatitis certain medicines for fungal infections like fluconazole, itraconazole, ketoconazole, posaconazole, or voriconazole cimetidine ciprofloxacin conivaptan cyclosporine dronedarone fluvoxamine grapefruit juice imatinib verapamil This list may not describe all possible interactions. Give your health care provider a list of all the medicines, herbs, non-prescription drugs, or dietary supplements you use. Also tell them if you smoke, drink alcohol, or use illegaldrugs. Some items may interact with your medicine. What should I watch for while using this medication? Your condition will be monitored carefully while you are receiving this  medicine. You will need important blood work done while you are taking thismedicine. Call your doctor or health care professional for advice if you get a fever, chills or sore throat, or other symptoms of a cold or flu. Do not treat yourself. This drug decreases your body's ability to fight infections. Try toavoid being around people who are sick. Some products may contain alcohol. Ask your health care professional if this medicine contains alcohol. Be sure to tell all health care professionals you are taking this medicine. Certain medicines, like metronidazole and disulfiram, can cause an unpleasant reaction when taken with alcohol. The reaction includes flushing, headache, nausea, vomiting, sweating, and increased thirst. Thereaction can last from 30 minutes to several hours. You may get drowsy or dizzy. Do not drive, use machinery, or do anything that needs mental alertness until you know how this medicine affects you. Do not stand or sit up quickly, especially if you are an older patient. This reduces the risk of dizzy or fainting spells. Alcohol may interfere with the effect ofthis medicine. Talk to your health care professional about your risk of cancer. You may bemore at risk for certain types of cancer if you take this medicine. Do not become pregnant while taking this medicine or for 6 months after stopping it. Women should inform their doctor if they wish to become pregnant or think they might be pregnant. There is a potential for serious side effects to an unborn child. Talk to your health care professional or pharmacist for more information.  Do not breast-feed an infant while taking this medicine orfor 1 week after stopping it. Males who get this medicine must use a condom during sex with females who can get pregnant. If you get a woman pregnant, the baby could have birth defects. The baby could die before they are born. You will need to continue wearing a condom for 3 months after stopping the  medicine. Tell your health care providerright away if your partner becomes pregnant while you are taking this medicine. This may interfere with the ability to father a child. You should talk to yourdoctor or health care professional if you are concerned about your fertility. What side effects may I notice from receiving this medication? Side effects that you should report to your doctor or health care professionalas soon as possible: allergic reactions like skin rash, itching or hives, swelling of the face, lips, or tongue blurred vision breathing problems changes in vision low blood counts - This drug may decrease the number of white blood cells, red blood cells and platelets. You may be at increased risk for infections and bleeding. nausea and vomiting pain, redness or irritation at site where injected pain, tingling, numbness in the hands or feet redness, blistering, peeling, or loosening of the skin, including inside the mouth signs of decreased platelets or bleeding - bruising, pinpoint red spots on the skin, black, tarry stools, nosebleeds signs of decreased red blood cells - unusually weak or tired, fainting spells, lightheadedness signs of infection - fever or chills, cough, sore throat, pain or difficulty passing urine swelling of the ankle, feet, hands Side effects that usually do not require medical attention (report to yourdoctor or health care professional if they continue or are bothersome): constipation diarrhea fingernail or toenail changes hair loss loss of appetite mouth sores muscle pain This list may not describe all possible side effects. Call your doctor for medical advice about side effects. You may report side effects to FDA at1-800-FDA-1088. Where should I keep my medication? This drug is given in a hospital or clinic and will not be stored at home. NOTE: This sheet is a summary. It may not cover all possible information. If you have questions about this medicine,  talk to your doctor, pharmacist, orhealth care provider.  2022 Elsevier/Gold Standard (2018-12-14 19:50:31)  Cisplatin injection What is this medication? CISPLATIN (SIS pla tin) is a chemotherapy drug. It targets fast dividing cells, like cancer cells, and causes these cells to die. This medicine is used totreat many types of cancer like bladder, ovarian, and testicular cancers. This medicine may be used for other purposes; ask your health care provider orpharmacist if you have questions. COMMON BRAND NAME(S): Platinol, Platinol -AQ What should I tell my care team before I take this medication? They need to know if you have any of these conditions: eye disease, vision problems hearing problems kidney disease low blood counts, like white cells, platelets, or red blood cells tingling of the fingers or toes, or other nerve disorder an unusual or allergic reaction to cisplatin, carboplatin, oxaliplatin, other medicines, foods, dyes, or preservatives pregnant or trying to get pregnant breast-feeding How should I use this medication? This drug is given as an infusion into a vein. It is administered in a hospitalor clinic by a specially trained health care professional. Talk to your pediatrician regarding the use of this medicine in children.Special care may be needed. Overdosage: If you think you have taken too much of this medicine contact apoison control center or emergency room at  once. NOTE: This medicine is only for you. Do not share this medicine with others. What if I miss a dose? It is important not to miss a dose. Call your doctor or health careprofessional if you are unable to keep an appointment. What may interact with this medication? This medicine may interact with the following medications: foscarnet certain antibiotics like amikacin, gentamicin, neomycin, polymyxin B, streptomycin, tobramycin, vancomycin This list may not describe all possible interactions. Give your health  care provider a list of all the medicines, herbs, non-prescription drugs, or dietary supplements you use. Also tell them if you smoke, drink alcohol, or use illegaldrugs. Some items may interact with your medicine. What should I watch for while using this medication? Your condition will be monitored carefully while you are receiving this medicine. You will need important blood work done while you are taking thismedicine. This drug may make you feel generally unwell. This is not uncommon, as chemotherapy can affect healthy cells as well as cancer cells. Report any side effects. Continue your course of treatment even though you feel ill unless yourdoctor tells you to stop. This medicine may increase your risk of getting an infection. Call your healthcare professional for advice if you get a fever, chills, or sore throat, or other symptoms of a cold or flu. Do not treat yourself. Try to avoid beingaround people who are sick. Avoid taking medicines that contain aspirin, acetaminophen, ibuprofen, naproxen, or ketoprofen unless instructed by your healthcare professional.These medicines may hide a fever. This medicine may increase your risk to bruise or bleed. Call your doctor orhealth care professional if you notice any unusual bleeding. Be careful brushing and flossing your teeth or using a toothpick because you may get an infection or bleed more easily. If you have any dental work done,tell your dentist you are receiving this medicine. Do not become pregnant while taking this medicine or for 14 months after stopping it. Women should inform their healthcare professional if they wish to become pregnant or think they might be pregnant. Men should not father a child while taking this medicine and for 11 months after stopping it. There is potential for serious side effects to an unborn child. Talk to your healthcareprofessional for more information. Do not breast-feed an infant while taking this medicine. This  medicine has caused ovarian failure in some women. This medicine may make it more difficult to get pregnant. Talk to your healthcare professional if [place] concerned about your fertility. This medicine has caused decreased sperm counts in some men. This may make it more difficult to father a child. Talk to your healthcare professional if [place] concerned about your fertility. Drink fluids as directed while you are taking this medicine. This will helpprotect your kidneys. Call your doctor or health care professional if you get diarrhea. Do not treatyourself. What side effects may I notice from receiving this medication? Side effects that you should report to your doctor or health care professionalas soon as possible: allergic reactions like skin rash, itching or hives, swelling of the face, lips, or tongue blurred vision changes in vision decreased hearing or ringing of the ears nausea, vomiting pain, redness, or irritation at site where injected pain, tingling, numbness in the hands or feet signs and symptoms of bleeding such as bloody or black, tarry stools; red or dark brown urine; spitting up blood or brown material that looks like coffee grounds; red spots on the skin; unusual bruising or bleeding from the eyes, gums, or nose signs and symptoms  of infection like fever; chills; cough; sore throat; pain or trouble passing urine signs and symptoms of kidney injury like trouble passing urine or change in the amount of urine signs and symptoms of low red blood cells or anemia such as unusually weak or tired; feeling faint or lightheaded; falls; breathing problems Side effects that usually do not require medical attention (report to yourdoctor or health care professional if they continue or are bothersome): loss of appetite mouth sores muscle cramps This list may not describe all possible side effects. Call your doctor for medical advice about side effects. You may report side effects to FDA  at1-800-FDA-1088. Where should I keep my medication? This drug is given in a hospital or clinic and will not be stored at home. NOTE: This sheet is a summary. It may not cover all possible information. If you have questions about this medicine, talk to your doctor, pharmacist, orhealth care provider.  2022 Elsevier/Gold Standard (2018-01-09 15:59:17)

## 2020-09-20 NOTE — Assessment & Plan Note (Addendum)
He is recovering well from treatment except for persistent elevated LFTs Oral thrush has resolved He still have significant cetuximab induced skin rash Due to his cardiovascular instability this morning and profound signs of dehydration, we will omit cetuximab and he will receive G-CSF injection only Continue supportive care

## 2020-09-21 ENCOUNTER — Inpatient Hospital Stay: Payer: BC Managed Care – PPO

## 2020-09-21 ENCOUNTER — Inpatient Hospital Stay (HOSPITAL_BASED_OUTPATIENT_CLINIC_OR_DEPARTMENT_OTHER): Payer: BC Managed Care – PPO | Admitting: Hematology and Oncology

## 2020-09-21 ENCOUNTER — Encounter: Payer: Self-pay | Admitting: Hematology and Oncology

## 2020-09-21 ENCOUNTER — Other Ambulatory Visit: Payer: Self-pay

## 2020-09-21 VITALS — BP 114/79 | HR 73 | Resp 16

## 2020-09-21 DIAGNOSIS — C7801 Secondary malignant neoplasm of right lung: Secondary | ICD-10-CM

## 2020-09-21 DIAGNOSIS — C029 Malignant neoplasm of tongue, unspecified: Secondary | ICD-10-CM

## 2020-09-21 DIAGNOSIS — R748 Abnormal levels of other serum enzymes: Secondary | ICD-10-CM | POA: Diagnosis not present

## 2020-09-21 DIAGNOSIS — R110 Nausea: Secondary | ICD-10-CM

## 2020-09-21 DIAGNOSIS — R531 Weakness: Secondary | ICD-10-CM | POA: Insufficient documentation

## 2020-09-21 DIAGNOSIS — C7802 Secondary malignant neoplasm of left lung: Secondary | ICD-10-CM

## 2020-09-21 DIAGNOSIS — Z5112 Encounter for antineoplastic immunotherapy: Secondary | ICD-10-CM | POA: Diagnosis not present

## 2020-09-21 LAB — CBC WITH DIFFERENTIAL (CANCER CENTER ONLY)
Abs Immature Granulocytes: 0.09 10*3/uL — ABNORMAL HIGH (ref 0.00–0.07)
Basophils Absolute: 0 10*3/uL (ref 0.0–0.1)
Basophils Relative: 0 %
Eosinophils Absolute: 0 10*3/uL (ref 0.0–0.5)
Eosinophils Relative: 0 %
HCT: 35.4 % — ABNORMAL LOW (ref 39.0–52.0)
Hemoglobin: 11.6 g/dL — ABNORMAL LOW (ref 13.0–17.0)
Immature Granulocytes: 1 %
Lymphocytes Relative: 4 %
Lymphs Abs: 0.3 10*3/uL — ABNORMAL LOW (ref 0.7–4.0)
MCH: 28 pg (ref 26.0–34.0)
MCHC: 32.8 g/dL (ref 30.0–36.0)
MCV: 85.5 fL (ref 80.0–100.0)
Monocytes Absolute: 0.2 10*3/uL (ref 0.1–1.0)
Monocytes Relative: 3 %
Neutro Abs: 8.5 10*3/uL — ABNORMAL HIGH (ref 1.7–7.7)
Neutrophils Relative %: 92 %
Platelet Count: 335 10*3/uL (ref 150–400)
RBC: 4.14 MIL/uL — ABNORMAL LOW (ref 4.22–5.81)
RDW: 20.6 % — ABNORMAL HIGH (ref 11.5–15.5)
WBC Count: 9.2 10*3/uL (ref 4.0–10.5)
nRBC: 0 % (ref 0.0–0.2)

## 2020-09-21 LAB — CMP (CANCER CENTER ONLY)
ALT: 68 U/L — ABNORMAL HIGH (ref 0–44)
AST: 42 U/L — ABNORMAL HIGH (ref 15–41)
Albumin: 3.2 g/dL — ABNORMAL LOW (ref 3.5–5.0)
Alkaline Phosphatase: 116 U/L (ref 38–126)
Anion gap: 9 (ref 5–15)
BUN: 17 mg/dL (ref 6–20)
CO2: 26 mmol/L (ref 22–32)
Calcium: 9 mg/dL (ref 8.9–10.3)
Chloride: 101 mmol/L (ref 98–111)
Creatinine: 0.69 mg/dL (ref 0.61–1.24)
GFR, Estimated: >60 mL/min (ref >60)
Glucose, Bld: 107 mg/dL — ABNORMAL HIGH (ref 70–99)
Potassium: 4 mmol/L (ref 3.5–5.1)
Sodium: 136 mmol/L (ref 135–145)
Total Bilirubin: 0.7 mg/dL (ref 0.3–1.2)
Total Protein: 6.9 g/dL (ref 6.5–8.1)

## 2020-09-21 LAB — MAGNESIUM: Magnesium: 1.9 mg/dL (ref 1.7–2.4)

## 2020-09-21 MED ORDER — SODIUM CHLORIDE 0.9 % IV SOLN: 8.0000 mg | Freq: Once | INTRAVENOUS | Status: DC

## 2020-09-21 MED ORDER — SODIUM CHLORIDE 0.9% FLUSH
10.0000 mL | Freq: Once | INTRAVENOUS | Status: AC
  Administered 2020-09-21: 10 mL

## 2020-09-21 MED ORDER — SODIUM CHLORIDE 0.9 % IV SOLN: INTRAVENOUS | Status: DC

## 2020-09-21 MED ORDER — PEGFILGRASTIM-JMDB 6 MG/0.6ML ~~LOC~~ SOSY
6.0000 mg | PREFILLED_SYRINGE | Freq: Once | SUBCUTANEOUS | Status: AC
  Administered 2020-09-21: 6 mg via SUBCUTANEOUS
  Filled 2020-09-21: qty 0.6

## 2020-09-21 MED ORDER — ONDANSETRON HCL 4 MG/2ML IJ SOLN
INTRAMUSCULAR | Status: AC
  Administered 2020-09-21: 8 mg via INTRAVENOUS
  Filled 2020-09-21: qty 4

## 2020-09-21 MED ORDER — HEPARIN SOD (PORK) LOCK FLUSH 100 UNIT/ML IV SOLN
500.0000 [IU] | Freq: Once | INTRAVENOUS | Status: AC
  Administered 2020-09-21: 500 [IU]

## 2020-09-21 MED ORDER — ONDANSETRON HCL 4 MG/2ML IJ SOLN: 8.0000 mg | Freq: Once | INTRAMUSCULAR | Status: AC

## 2020-09-21 NOTE — Progress Notes (Signed)
On presentation to flush room, patient c/o significant nausea with vomiting x 1 prior to arrival. After [facility] accessed, patient exhibited AMS which resolved in <1 min. Patient verbalized nausea had intensified. Patient then stood and began c/o dizziness. He returned to sitting position in treatment chair. Mr. [name] again demonstrating AMS with decreased responsiveness. AMS quickly resoloved. He became pale. SBP 80's. IV NS bolus initiated at wide open rate and Dr. Alvy Bimler came to flush room to evaluate. Medicated as documented in [facility]. Patient began verbalizing he was feeling better. Took patient via wheelchair to procedure room and report given to Laurence Compton, RN. At time of care transfer, patient alert and oriented in no obvious distress.

## 2020-09-21 NOTE — Assessment & Plan Note (Signed)
He was evaluated several times in the infusion room and had received 2 L of IV fluid before discharge due to symptomatic dizziness and hypotension I suspect he might be dehydrated He felt better at the time of discharge He did not receive cetuximab today due to his symptoms and we will just focus on supportive care

## 2020-09-21 NOTE — Progress Notes (Signed)
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 except for persistent elevated LFTs Oral thrush has resolved He still have significant cetuximab induced skin rash Due to his cardiovascular instability this morning and profound signs of dehydration, we will omit cetuximab and he will receive G-CSF injection only Continue supportive care  Generalized weakness He was evaluated several times in the infusion room and had received 2 L of IV fluid before discharge due to symptomatic dizziness and hypotension I suspect he might be dehydrated He felt better at the time of discharge He did not receive cetuximab today due to his symptoms and we will just focus on supportive care  Elevated liver enzymes This is due to side effects of chemo It is much improved compared to his baseline  Nausea without vomiting This is likely due to motion sickness He felt better after receiving IV Zofran  No orders of the defined types were placed in this encounter.   All questions were answered. The patient knows to call the clinic with any problems, questions or concerns. The total time spent in the appointment was 40 minutes encounter with patients including review of chart and various tests results, discussions about plan of care and coordination of care plan   Heath Lark, MD 09/21/2020 12:10 PM  INTERVAL HISTORY: Please see below for problem oriented charting. 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   09/06/2011 Surgery   He underwent right neck dissection which showed 1/24 LN involved and repeat resection of tongue was negative   03/23/2012 Surgery   Repeat tngue resection showed well differentiated SCC 1.5 cm which invaded into the muscle   04/17/2012 Imaging   PET/CT scan showed New adenopathy in the left neck, compatible with recurrent malignancy   04/23/2012 Surgery   he underwent left neck LN dissection and 2/34 LN were positve   06/01/2012 - 07/13/2012 Chemotherapy   Patient had 3 cycles of cisplatin   06/12/2012 - 07/14/2012 Radiation Therapy   Patient completed RT   12/10/2012 Imaging   Interval resolution of previous hypermetabolic cervical adenopathy. There is nonspecific asymmetric increased uptake within the right side of tongue   03/13/2013 Imaging   PET/CT scan showed no evidence of disease recurrence. He was noted to have incidental kidney stones   08/15/2014 Imaging   CT scan of the dissection no evidence of disease   05/02/2020 Imaging   CT scan of neck 1. 4.3 x 2.7 x 2.7 cm peripherally enhancing mass at the anterior tongue base with focal sclerosis of the anterior genu of the mandible. This is concerning for recurrent squamous cell carcinoma  with possible involvement of the mandible. 2. 8 mm peripherally enhancing left level 2 lymph node is concerning for metastatic disease. Recommend PET scan of the neck to further characterize disease. 3. No other significant recurrent left-sided lymph nodes. 4. Post radiation changes of the carotid  sheath bilaterally. 5. Post radiation changes at the lung apices bilaterally. 6. Polyps or mucous retention cysts within the maxillary sinuses bilaterally.   05/24/2020 Pathology Results   A. SUBMENTAL MASS, MIDLINE, NEEDLE CORE BIOPSY:  - Poorly differentiated sarcomatoid malignancy.   COMMENT:   CK7 is positive.  CKAE1/AE3, CK8/18, CK20, p40, CK5/6, S-100, Melan-A and Desmin are negative.  SMA demonstrates nonspecific staining.  Ki-67 proliferation index is high.  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     05/24/2020 Procedure   Successful ultrasound submental mass 18 gauge core biopsy   06/07/2020 PET scan   IMPRESSION: 1. Unfortunately evidence of squamous cell carcinoma recurrence in the floor of the mouth. Broad lesion with intense peripheral metabolic activity in the anterior RIGHT floor the mouth. 2. Bilateral hypermetabolic nodules within the sub submandibular which are new from PET-CT 2015. Concern for unusual metastasis to the submandibular glands. 3. New small bilateral small pulmonary nodules with faint radiotracer activity. These are also concerning for metastasis. 4. Asymmetric hypermetabolic activity within the RIGHT testicle. Favor benign inflammation; however consider testicular ultrasound if concern for unusual pattern of metastasis.   06/15/2020 Procedure   Successful placement of a right internal jugular approach power injectable Port-A-Cath. The catheter is ready for immediate use.     06/19/2020 - 08/05/2020 Chemotherapy          08/21/2020 PET scan   1. Progressive tumor in the floor the mouth with overt permeative destructive changes involving the mandible. 2. Persistent hypermetabolic neck nodes as detailed above. No new findings. 3. Progressive pulmonary metastatic disease. 4. No findings for abdominal/pelvic metastatic disease. 5. Persistent hypermetabolism in the right hemiscrotum most likely due to  a varicocele.     08/29/2020 -  Chemotherapy    Patient is on Treatment Plan: HEAD/NECK CISPLATIN + DOCETAXEL Q21D PLUS CETUXIMAB    Patient is on Antibody Plan: HEAD/NECK CETUXIMAB Q7D     08/31/2020 -  Chemotherapy    Patient is on Treatment Plan: HEAD/NECK CISPLATIN + DOCETAXEL Q21D PLUS CETUXIMAB    Patient is on Antibody Plan: HEAD/NECK CETUXIMAB Q7D     Metastasis to lung ([place])  06/09/2020 Initial Diagnosis   Metastasis to lung ([place])   08/31/2020 -  Chemotherapy    Patient is on Treatment Plan: HEAD/NECK CISPLATIN + DOCETAXEL Q21D PLUS CETUXIMAB    Patient is on Antibody Plan: HEAD/NECK CETUXIMAB Q7D       REVIEW OF SYSTEMS:   Constitutional: Denies fevers, chills or abnormal weight loss Eyes: Denies blurriness of vision Ears, nose, mouth, throat, and face: Denies mucositis or sore throat Respiratory: Denies cough, dyspnea or wheezes Cardiovascular: Denies palpitation, chest discomfort or lower extremity swelling Skin: Denies abnormal skin rashes Lymphatics: Denies new lymphadenopathy or easy bruising Behavioral/Psych: Mood is stable, no new changes  All other systems were reviewed with the patient and are negative.  I have reviewed the past medical history, past surgical history, social history and family history with the patient and they are unchanged from previous note.  ALLERGIES:  is allergic to cetuximab.  MEDICATIONS:  Current Outpatient Medications  Medication Sig Dispense Refill   doxycycline (VIBRA-TABS) 100 MG tablet Take 1 tablet (100  mg total) by mouth 2 (two) times daily. 60 tablet 0   fluconazole (DIFLUCAN) 100 MG tablet Take 1 tablet (100 mg total) by mouth daily. 7 tablet 0   loratadine (CLARITIN) 10 MG tablet Take 10 mg by mouth daily as needed for allergies.     magic mouthwash (nystatin, lidocaine, diphenhydrAMINE, alum & mag hydroxide) suspension Swish and spit 5 mls by mouth 4 times a day as needed 240 mL 0   morphine (MS CONTIN) 60 MG 12 hr  tablet Take 1 tablet (60 mg total) by mouth in the morning, at noon, and at bedtime. 90 tablet 0   morphine (MSIR) 30 MG tablet Take 1 tablet (30 mg total) by mouth every 4 (four) hours as needed for severe pain. 90 tablet 0   polyethylene glycol (MIRALAX / GLYCOLAX) 17 g packet Take 17 g by mouth daily.     predniSONE (DELTASONE) 10 MG tablet Take 1 tablet (10 mg total) by mouth daily with breakfast. 30 tablet 2   prochlorperazine (COMPAZINE) 10 MG tablet Take 1 tablet (10 mg total) by mouth every 6 (six) hours as needed for nausea or vomiting. 30 tablet 3   senna (SENOKOT) 8.6 MG tablet Take 2 tablets by mouth 2 (two) times daily.     No current facility-administered medications for this visit.   Facility-Administered Medications Ordered in Other Visits  Medication Dose Route Frequency Provider Last Rate Last Admin   0.9 %  sodium chloride infusion   Intravenous Continuous Heath Lark, MD   Stopped at 09/21/20 1055    PHYSICAL EXAMINATION: ECOG PERFORMANCE STATUS: 2 - Symptomatic, <50% confined to bed GENERAL:alert, no distress and comfortable.  He looks pale initially but much improved at the time of discharge SKIN: He is noted to have skin rash on his face EYES: normal, Conjunctiva are pink and non-injected, sclera clear OROPHARYNX:no exudate, no erythema and lips, buccal mucosa, and tongue normal.  No signs of oral thrush NECK: The previous erythematous changes at the base of his tongue is improved LYMPH:  no palpable lymphadenopathy in the cervical, axillary or inguinal LUNGS: clear to auscultation and percussion with normal breathing effort HEART: regular rate & rhythm and no murmurs and no lower extremity edema ABDOMEN:abdomen soft, non-tender and normal bowel sounds Musculoskeletal:no cyanosis of digits and no clubbing  NEURO: alert & oriented x 3 with fluent speech, no focal motor/sensory deficits  LABORATORY DATA:  I have reviewed the data as listed    Component Value  Date/Time   NA 136 09/21/2020 0834   NA 141 04/29/2016 1402   K 4.0 09/21/2020 0834   K 4.1 04/29/2016 1402   CL 101 09/21/2020 0834   CL 100 07/10/2012 1341   CO2 26 09/21/2020 0834   CO2 25 04/29/2016 1402   GLUCOSE 107 (H) 09/21/2020 0834   GLUCOSE 91 04/29/2016 1402   GLUCOSE 118 (H) 07/10/2012 1341   BUN 17 09/21/2020 0834   BUN 9.7 04/29/2016 1402   CREATININE 0.69 09/21/2020 0834   CREATININE 1.0 04/29/2016 1402   CALCIUM 9.0 09/21/2020 0834   CALCIUM 9.3 04/29/2016 1402   PROT 6.9 09/21/2020 0834   PROT 7.0 04/29/2016 1402   ALBUMIN 3.2 (L) 09/21/2020 0834   ALBUMIN 4.1 04/29/2016 1402   AST 42 (H) 09/21/2020 0834   AST 28 04/29/2016 1402   ALT 68 (H) 09/21/2020 0834   ALT 26 04/29/2016 1402   ALKPHOS 116 09/21/2020 0834   ALKPHOS 71 04/29/2016 1402   BILITOT  0.7 09/21/2020 0834   BILITOT 0.43 04/29/2016 1402   GFRNONAA >60 09/21/2020 0834   GFRAA >60 05/02/2017 0901    No results found for: SPEP, UPEP  Lab Results  Component Value Date   WBC 9.2 09/21/2020   NEUTROABS 8.5 (H) 09/21/2020   HGB 11.6 (L) 09/21/2020   HCT 35.4 (L) 09/21/2020   MCV 85.5 09/21/2020   PLT 335 09/21/2020      Chemistry      Component Value Date/Time   NA 136 09/21/2020 0834   NA 141 04/29/2016 1402   K 4.0 09/21/2020 0834   K 4.1 04/29/2016 1402   CL 101 09/21/2020 0834   CL 100 07/10/2012 1341   CO2 26 09/21/2020 0834   CO2 25 04/29/2016 1402   BUN 17 09/21/2020 0834   BUN 9.7 04/29/2016 1402   CREATININE 0.69 09/21/2020 0834   CREATININE 1.0 04/29/2016 1402      Component Value Date/Time   CALCIUM 9.0 09/21/2020 0834   CALCIUM 9.3 04/29/2016 1402   ALKPHOS 116 09/21/2020 0834   ALKPHOS 71 04/29/2016 1402   AST 42 (H) 09/21/2020 0834   AST 28 04/29/2016 1402   ALT 68 (H) 09/21/2020 0834   ALT 26 04/29/2016 1402   BILITOT 0.7 09/21/2020 0834   BILITOT 0.43 04/29/2016 1402

## 2020-09-21 NOTE — Assessment & Plan Note (Signed)
This is likely due to motion sickness He felt better after receiving IV Zofran

## 2020-09-21 NOTE — Assessment & Plan Note (Signed)
This is due to side effects of chemo It is much improved compared to his baseline

## 2020-09-22 ENCOUNTER — Telehealth: Payer: Self-pay

## 2020-09-22 NOTE — Telephone Encounter (Signed)
-----   Message from Heath Lark, MD sent at 09/22/2020  8:09 AM EDT ----- Can you calla nd check on him?

## 2020-09-22 NOTE — Telephone Encounter (Signed)
Left below message and ask that he call the office back.

## 2020-09-22 NOTE — Telephone Encounter (Signed)
Called and spoke with wife to see how [name] is doing today. He is laying down now and feeling much better today. They appreciated the call.  Instructed to call the office back for questions or concerns. Wife verbalized understanding.

## 2020-09-25 ENCOUNTER — Other Ambulatory Visit (HOSPITAL_COMMUNITY): Payer: Self-pay

## 2020-09-25 ENCOUNTER — Other Ambulatory Visit: Payer: Self-pay

## 2020-09-25 ENCOUNTER — Telehealth: Payer: Self-pay

## 2020-09-25 MED ORDER — NYSTATIN 100000 UNIT/ML MT SUSP
OROMUCOSAL | 0 refills | Status: DC
  Filled 2020-09-25: qty 240, 12d supply, fill #0

## 2020-09-25 NOTE — Telephone Encounter (Signed)
Yes Is he feeling better?

## 2020-09-25 NOTE — Telephone Encounter (Signed)
Called back. Rx sent to [facility].  He is feeling better and eating/drinking with no problems. Instructed to call the office back if needed. He verbalized understanding.

## 2020-09-25 NOTE — Telephone Encounter (Signed)
He called and left a message. He has thrush again and needs Rx. Okay to refill Magic mouth wash as ordered previously?

## 2020-09-26 ENCOUNTER — Other Ambulatory Visit (HOSPITAL_COMMUNITY): Payer: Self-pay

## 2020-09-27 ENCOUNTER — Other Ambulatory Visit (HOSPITAL_COMMUNITY): Payer: Self-pay

## 2020-09-28 ENCOUNTER — Inpatient Hospital Stay: Payer: BC Managed Care – PPO | Attending: Hematology and Oncology

## 2020-09-28 ENCOUNTER — Inpatient Hospital Stay: Payer: BC Managed Care – PPO

## 2020-09-28 ENCOUNTER — Other Ambulatory Visit: Payer: Self-pay

## 2020-09-28 ENCOUNTER — Other Ambulatory Visit: Payer: Self-pay | Admitting: Hematology and Oncology

## 2020-09-28 ENCOUNTER — Telehealth: Payer: Self-pay

## 2020-09-28 DIAGNOSIS — Z5189 Encounter for other specified aftercare: Secondary | ICD-10-CM | POA: Insufficient documentation

## 2020-09-28 DIAGNOSIS — R21 Rash and other nonspecific skin eruption: Secondary | ICD-10-CM | POA: Diagnosis not present

## 2020-09-28 DIAGNOSIS — C7801 Secondary malignant neoplasm of right lung: Secondary | ICD-10-CM

## 2020-09-28 DIAGNOSIS — C029 Malignant neoplasm of tongue, unspecified: Secondary | ICD-10-CM | POA: Diagnosis present

## 2020-09-28 DIAGNOSIS — Z5111 Encounter for antineoplastic chemotherapy: Secondary | ICD-10-CM | POA: Diagnosis present

## 2020-09-28 DIAGNOSIS — Z5112 Encounter for antineoplastic immunotherapy: Secondary | ICD-10-CM | POA: Diagnosis not present

## 2020-09-28 DIAGNOSIS — G893 Neoplasm related pain (acute) (chronic): Secondary | ICD-10-CM | POA: Insufficient documentation

## 2020-09-28 LAB — CBC WITH DIFFERENTIAL (CANCER CENTER ONLY)
Abs Immature Granulocytes: 0.15 10*3/uL — ABNORMAL HIGH (ref 0.00–0.07)
Basophils Absolute: 0.1 10*3/uL (ref 0.0–0.1)
Basophils Relative: 1 %
Eosinophils Absolute: 0 10*3/uL (ref 0.0–0.5)
Eosinophils Relative: 0 %
HCT: 32.3 % — ABNORMAL LOW (ref 39.0–52.0)
Hemoglobin: 10.9 g/dL — ABNORMAL LOW (ref 13.0–17.0)
Immature Granulocytes: 4 %
Lymphocytes Relative: 6 %
Lymphs Abs: 0.2 10*3/uL — ABNORMAL LOW (ref 0.7–4.0)
MCH: 28.5 pg (ref 26.0–34.0)
MCHC: 33.7 g/dL (ref 30.0–36.0)
MCV: 84.6 fL (ref 80.0–100.0)
Monocytes Absolute: 0.6 10*3/uL (ref 0.1–1.0)
Monocytes Relative: 17 %
Neutro Abs: 2.7 10*3/uL (ref 1.7–7.7)
Neutrophils Relative %: 72 %
Platelet Count: 293 10*3/uL (ref 150–400)
RBC: 3.82 MIL/uL — ABNORMAL LOW (ref 4.22–5.81)
RDW: 19.5 % — ABNORMAL HIGH (ref 11.5–15.5)
WBC Count: 3.7 10*3/uL — ABNORMAL LOW (ref 4.0–10.5)
WBC Morphology: INCREASED
nRBC: 0 % (ref 0.0–0.2)

## 2020-09-28 LAB — CMP (CANCER CENTER ONLY)
ALT: 60 U/L — ABNORMAL HIGH (ref 0–44)
AST: 20 U/L (ref 15–41)
Albumin: 3.4 g/dL — ABNORMAL LOW (ref 3.5–5.0)
Alkaline Phosphatase: 108 U/L (ref 38–126)
Anion gap: 12 (ref 5–15)
BUN: 11 mg/dL (ref 6–20)
CO2: 23 mmol/L (ref 22–32)
Calcium: 9.5 mg/dL (ref 8.9–10.3)
Chloride: 101 mmol/L (ref 98–111)
Creatinine: 0.64 mg/dL (ref 0.61–1.24)
GFR, Estimated: >60 mL/min (ref >60)
Glucose, Bld: 122 mg/dL — ABNORMAL HIGH (ref 70–99)
Potassium: 4 mmol/L (ref 3.5–5.1)
Sodium: 136 mmol/L (ref 135–145)
Total Bilirubin: 0.3 mg/dL (ref 0.3–1.2)
Total Protein: 7 g/dL (ref 6.5–8.1)

## 2020-09-28 LAB — MAGNESIUM: Magnesium: 1.6 mg/dL — ABNORMAL LOW (ref 1.7–2.4)

## 2020-09-28 MED ORDER — HEPARIN SOD (PORK) LOCK FLUSH 100 UNIT/ML IV SOLN
500.0000 [IU] | Freq: Once | INTRAVENOUS | Status: AC | PRN
  Administered 2020-09-28: 500 [IU]

## 2020-09-28 MED ORDER — MAGNESIUM SULFATE 2 GM/50ML IV SOLN
2.0000 g | Freq: Once | INTRAVENOUS | Status: AC
  Administered 2020-09-28: 2 g via INTRAVENOUS
  Filled 2020-09-28: qty 50

## 2020-09-28 MED ORDER — SODIUM CHLORIDE 0.9% FLUSH
10.0000 mL | Freq: Once | INTRAVENOUS | Status: AC
  Administered 2020-09-28: 10 mL

## 2020-09-28 MED ORDER — SODIUM CHLORIDE 0.9 % IV SOLN: Freq: Once | INTRAVENOUS | Status: AC

## 2020-09-28 MED ORDER — SODIUM CHLORIDE 0.9% FLUSH
10.0000 mL | Freq: Once | INTRAVENOUS | Status: AC | PRN
  Administered 2020-09-28: 10 mL

## 2020-09-28 NOTE — Progress Notes (Signed)
Per Dr. Chryl Heck (Dr. Alvy Bimler on [facility]), will not resume Cetuximab today.  Pt had cardiovascular instability on 8/25.   Pt is slightly tachycardic today (HR = 99 bpm) and hypomagnesemic. Give IL NS and Mg 2 g IV today.  Pt due to f/u w/ Dr. Alvy Bimler on 9/8 for consideration of resuming Cetuximab at that time.  Kennith Center, Pharm.D., CPP 09/28/2020'@10'$ :47 AM

## 2020-09-28 NOTE — Patient Instructions (Signed)

## 2020-09-28 NOTE — Telephone Encounter (Signed)
Returned call to wife. She is requesting a refill of Morphine and MS Contin Rx. Oleg has enough to last until tomorrow.  She is requesting Diflucan refill as before for thrush. The thrush gets worse after treatment. He is using the magic mouth wash Rx.  Wife ask that Rx's be sent to [facility] in [place] tomorrow.

## 2020-09-29 ENCOUNTER — Other Ambulatory Visit (HOSPITAL_COMMUNITY): Payer: Self-pay

## 2020-09-29 ENCOUNTER — Other Ambulatory Visit: Payer: Self-pay | Admitting: Hematology and Oncology

## 2020-09-29 ENCOUNTER — Telehealth: Payer: Self-pay

## 2020-09-29 ENCOUNTER — Encounter: Payer: Self-pay | Admitting: Hematology and Oncology

## 2020-09-29 DIAGNOSIS — B379 Candidiasis, unspecified: Secondary | ICD-10-CM

## 2020-09-29 MED ORDER — MORPHINE SULFATE ER 60 MG PO TBCR: 60.0000 mg | EXTENDED_RELEASE_TABLET | Freq: Three times a day (TID) | ORAL | 0 refills | Status: DC

## 2020-09-29 MED ORDER — MORPHINE SULFATE 30 MG PO TABS
30.0000 mg | ORAL_TABLET | ORAL | 0 refills | Status: DC | PRN
  Filled 2020-09-29: qty 90, 30d supply, fill #0
  Filled 2020-09-29: qty 90, 15d supply, fill #0

## 2020-09-29 MED ORDER — MORPHINE SULFATE 30 MG PO TABS: 30.0000 mg | ORAL_TABLET | ORAL | 0 refills | Status: DC | PRN

## 2020-09-29 MED ORDER — FLUCONAZOLE 100 MG PO TABS: 100.0000 mg | ORAL_TABLET | Freq: Every day | ORAL | 0 refills | Status: DC

## 2020-09-29 MED ORDER — FLUCONAZOLE 100 MG PO TABS
100.0000 mg | ORAL_TABLET | Freq: Every day | ORAL | 0 refills | Status: DC
  Filled 2020-09-29 (×2): qty 7, 7d supply, fill #0

## 2020-09-29 MED ORDER — MORPHINE SULFATE ER 60 MG PO TBCR
60.0000 mg | EXTENDED_RELEASE_TABLET | Freq: Three times a day (TID) | ORAL | 0 refills | Status: DC
  Filled 2020-09-29 (×2): qty 90, 30d supply, fill #0

## 2020-09-29 NOTE — Telephone Encounter (Signed)
Wife called and left a message. [facility] does not have the x 3 Rx's in stock. Please send to [facility].

## 2020-09-29 NOTE — Telephone Encounter (Signed)
Called wife and told Rx's sent. She verbalized understanding.

## 2020-09-29 NOTE — Telephone Encounter (Signed)
I sent refills I suggest him or wife to call the pharmacy and make sure they have the pain meds in stock, if not, we can switch to [facility] It's a long weekend

## 2020-09-29 NOTE — Telephone Encounter (Signed)
Called wife and given below message. She will call the pharmacy and call the office back if needed.

## 2020-09-29 NOTE — Telephone Encounter (Signed)
done

## 2020-10-05 ENCOUNTER — Inpatient Hospital Stay: Payer: BC Managed Care – PPO

## 2020-10-05 ENCOUNTER — Inpatient Hospital Stay (HOSPITAL_BASED_OUTPATIENT_CLINIC_OR_DEPARTMENT_OTHER): Payer: BC Managed Care – PPO | Admitting: Hematology and Oncology

## 2020-10-05 ENCOUNTER — Other Ambulatory Visit: Payer: Self-pay

## 2020-10-05 ENCOUNTER — Encounter: Payer: Self-pay | Admitting: Hematology and Oncology

## 2020-10-05 ENCOUNTER — Encounter: Payer: Self-pay | Admitting: *Deleted

## 2020-10-05 ENCOUNTER — Other Ambulatory Visit (HOSPITAL_COMMUNITY): Payer: Self-pay

## 2020-10-05 VITALS — HR 88

## 2020-10-05 DIAGNOSIS — C7802 Secondary malignant neoplasm of left lung: Secondary | ICD-10-CM

## 2020-10-05 DIAGNOSIS — L270 Generalized skin eruption due to drugs and medicaments taken internally: Secondary | ICD-10-CM

## 2020-10-05 DIAGNOSIS — C029 Malignant neoplasm of tongue, unspecified: Secondary | ICD-10-CM

## 2020-10-05 DIAGNOSIS — B370 Candidal stomatitis: Secondary | ICD-10-CM | POA: Diagnosis not present

## 2020-10-05 DIAGNOSIS — G893 Neoplasm related pain (acute) (chronic): Secondary | ICD-10-CM | POA: Diagnosis not present

## 2020-10-05 DIAGNOSIS — C7801 Secondary malignant neoplasm of right lung: Secondary | ICD-10-CM

## 2020-10-05 DIAGNOSIS — Z5112 Encounter for antineoplastic immunotherapy: Secondary | ICD-10-CM | POA: Diagnosis not present

## 2020-10-05 LAB — CMP (CANCER CENTER ONLY)
ALT: 26 U/L (ref 0–44)
AST: 15 U/L (ref 15–41)
Albumin: 3.3 g/dL — ABNORMAL LOW (ref 3.5–5.0)
Alkaline Phosphatase: 103 U/L (ref 38–126)
Anion gap: 10 (ref 5–15)
BUN: 7 mg/dL (ref 6–20)
CO2: 27 mmol/L (ref 22–32)
Calcium: 9.5 mg/dL (ref 8.9–10.3)
Chloride: 102 mmol/L (ref 98–111)
Creatinine: 0.66 mg/dL (ref 0.61–1.24)
GFR, Estimated: >60 mL/min (ref >60)
Glucose, Bld: 117 mg/dL — ABNORMAL HIGH (ref 70–99)
Potassium: 4 mmol/L (ref 3.5–5.1)
Sodium: 139 mmol/L (ref 135–145)
Total Bilirubin: 0.4 mg/dL (ref 0.3–1.2)
Total Protein: 6.8 g/dL (ref 6.5–8.1)

## 2020-10-05 LAB — CBC WITH DIFFERENTIAL (CANCER CENTER ONLY)
Abs Immature Granulocytes: 0.11 10*3/uL — ABNORMAL HIGH (ref 0.00–0.07)
Basophils Absolute: 0 10*3/uL (ref 0.0–0.1)
Basophils Relative: 0 %
Eosinophils Absolute: 0 10*3/uL (ref 0.0–0.5)
Eosinophils Relative: 0 %
HCT: 31.7 % — ABNORMAL LOW (ref 39.0–52.0)
Hemoglobin: 10.1 g/dL — ABNORMAL LOW (ref 13.0–17.0)
Immature Granulocytes: 1 %
Lymphocytes Relative: 2 %
Lymphs Abs: 0.2 10*3/uL — ABNORMAL LOW (ref 0.7–4.0)
MCH: 28.3 pg (ref 26.0–34.0)
MCHC: 31.9 g/dL (ref 30.0–36.0)
MCV: 88.8 fL (ref 80.0–100.0)
Monocytes Absolute: 1 10*3/uL (ref 0.1–1.0)
Monocytes Relative: 9 %
Neutro Abs: 9.7 10*3/uL — ABNORMAL HIGH (ref 1.7–7.7)
Neutrophils Relative %: 88 %
Platelet Count: 262 10*3/uL (ref 150–400)
RBC: 3.57 MIL/uL — ABNORMAL LOW (ref 4.22–5.81)
RDW: 19.7 % — ABNORMAL HIGH (ref 11.5–15.5)
WBC Count: 11 10*3/uL — ABNORMAL HIGH (ref 4.0–10.5)
nRBC: 0 % (ref 0.0–0.2)

## 2020-10-05 LAB — MAGNESIUM: Magnesium: 1.8 mg/dL (ref 1.7–2.4)

## 2020-10-05 MED ORDER — PREDNISONE 5 MG PO TABS
5.0000 mg | ORAL_TABLET | Freq: Every day | ORAL | 1 refills | Status: DC
  Filled 2020-10-05: qty 30, 30d supply, fill #0

## 2020-10-05 MED ORDER — FAMOTIDINE 20 MG IN NS 100 ML IVPB: 20.0000 mg | Freq: Once | INTRAVENOUS | Status: AC

## 2020-10-05 MED ORDER — SODIUM CHLORIDE 0.9% FLUSH
10.0000 mL | Freq: Once | INTRAVENOUS | Status: AC
  Administered 2020-10-05: 10 mL

## 2020-10-05 MED ORDER — FAMOTIDINE 20 MG IN NS 100 ML IVPB
INTRAVENOUS | Status: AC
  Administered 2020-10-05: 20 mg via INTRAVENOUS
  Filled 2020-10-05: qty 100

## 2020-10-05 MED ORDER — EMPTY CONTAINERS FLEXIBLE MISC
150.0000 mg/m2 | Freq: Once | Status: AC
  Administered 2020-10-05: 300 mg via INTRAVENOUS
  Filled 2020-10-05: qty 150

## 2020-10-05 MED ORDER — SODIUM CHLORIDE 0.9 % IV SOLN: Freq: Once | INTRAVENOUS | Status: AC

## 2020-10-05 MED ORDER — METHYLPREDNISOLONE SODIUM SUCC 125 MG IJ SOLR: 125.0000 mg | Freq: Once | INTRAMUSCULAR | Status: AC

## 2020-10-05 MED ORDER — HEPARIN SOD (PORK) LOCK FLUSH 100 UNIT/ML IV SOLN
500.0000 [IU] | Freq: Once | INTRAVENOUS | Status: AC | PRN
  Administered 2020-10-05: 500 [IU]

## 2020-10-05 MED ORDER — SODIUM CHLORIDE 0.9% FLUSH
10.0000 mL | INTRAVENOUS | Status: DC | PRN
Start: 2020-10-05 — End: 2020-10-05
  Administered 2020-10-05: 10 mL

## 2020-10-05 MED ORDER — DIPHENHYDRAMINE HCL 50 MG/ML IJ SOLN
25.0000 mg | Freq: Once | INTRAMUSCULAR | Status: AC
  Administered 2020-10-05: 25 mg via INTRAVENOUS
  Filled 2020-10-05: qty 1

## 2020-10-05 MED ORDER — METHYLPREDNISOLONE SODIUM SUCC 125 MG IJ SOLR
INTRAMUSCULAR | Status: AC
  Administered 2020-10-05: 125 mg via INTRAVENOUS
  Filled 2020-10-05: qty 2

## 2020-10-05 NOTE — Assessment & Plan Note (Signed)
His skin rash is stable He will continue low-dose doxycycline

## 2020-10-05 NOTE — Assessment & Plan Note (Signed)
His oral thrush has resolved I recommend reducing dose of prednisone and eventual prednisone taper

## 2020-10-05 NOTE — Assessment & Plan Note (Signed)
The base of his tongue is discharging necrotic tumor He has less pain and has started to gain weight Overall, I think the patient is responding well to chemotherapy He will proceed with cycle 3 of treatment next week and I plan to order imaging study when I see him back next week He tolerated reduced dose cetuximab better and we will continue on reduced dose cetuximab today

## 2020-10-05 NOTE — Patient Instructions (Signed)
[facility] ONCOLOGY  Discharge Instructions: Thank you for choosing [facility] to provide your oncology and hematology care.   If you have a lab appointment with the [facility], please go directly to the [facility] and check in at the registration area.   Wear comfortable clothing and clothing appropriate for easy access to any Portacath or PICC line.   We strive to give you quality time with your provider. You may need to reschedule your appointment if you arrive late (15 or more minutes).  Arriving late affects you and other patients whose appointments are after yours.  Also, if you miss three or more appointments without notifying the office, you may be dismissed from the clinic at the provider's discretion.      For prescription refill requests, have your pharmacy contact our office and allow 72 hours for refills to be completed.    Today you received the following chemotherapy and/or immunotherapy agents Cetuximab        To help prevent nausea and vomiting after your treatment, we encourage you to take your nausea medication as directed.  BELOW ARE SYMPTOMS THAT SHOULD BE REPORTED IMMEDIATELY: *FEVER GREATER THAN 100.4 F (38 C) OR HIGHER *CHILLS OR SWEATING *NAUSEA AND VOMITING THAT IS NOT CONTROLLED WITH YOUR NAUSEA MEDICATION *UNUSUAL SHORTNESS OF BREATH *UNUSUAL BRUISING OR BLEEDING *URINARY PROBLEMS (pain or burning when urinating, or frequent urination) *BOWEL PROBLEMS (unusual diarrhea, constipation, pain near the anus) TENDERNESS IN MOUTH AND THROAT WITH OR WITHOUT PRESENCE OF ULCERS (sore throat, sores in mouth, or a toothache) UNUSUAL RASH, SWELLING OR PAIN  UNUSUAL VAGINAL DISCHARGE OR ITCHING   Items with * indicate a potential emergency and should be followed up as soon as possible or go to the Emergency Department if any problems should occur.  Please show the CHEMOTHERAPY ALERT CARD or IMMUNOTHERAPY ALERT CARD at check-in to  the Emergency Department and triage nurse.  Should you have questions after your visit or need to cancel or reschedule your appointment, please contact [facility]  Dept: [phone]  and follow the prompts.  Office hours are 8:00 a.m. to 4:30 p.m. Monday - Friday. Please note that voicemails left after 4:00 p.m. may not be returned until the following business day.  We are closed weekends and major holidays. You have access to a nurse at all times for urgent questions. Please call the main number to the clinic Dept: [phone] and follow the prompts.   For any non-urgent questions, you may also contact your provider using MyChart. We now offer e-Visits for anyone 75 and older to request care online for non-urgent symptoms. For details visit [email or web address].   Also download the MyChart app! Go to the app store, search "MyChart", open the app, select [facility], and log in with your MyChart username and password.  Due to Covid, a mask is required upon entering the hospital/clinic. If you do not have a mask, one will be given to you upon arrival. For doctor visits, patients may have 1 support person aged 56 or older with them. For treatment visits, patients cannot have anyone with them due to current Covid guidelines and our immunocompromised population.

## 2020-10-05 NOTE — Assessment & Plan Note (Signed)
His pain is better controlled He will continue current prescribed pain regimen

## 2020-10-05 NOTE — Progress Notes (Signed)
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 He has less pain and has started to gain weight Overall, I think the patient is responding well to chemotherapy He will proceed with cycle 3 of treatment next week and I plan to order imaging study when I see him back next week He tolerated reduced dose cetuximab better and we will continue on reduced dose cetuximab today  Cancer associated pain His pain is better controlled He will continue current prescribed pain regimen  Drug-induced skin rash His skin rash is stable He will continue low-dose doxycycline  Thrush, oral His oral thrush has resolved I recommend reducing dose of prednisone and eventual prednisone taper  No orders of the defined types were placed in this encounter.   All questions were answered. The patient knows to call the clinic with any problems, questions or concerns. The total time spent in the appointment was 20 minutes encounter with patients including review of chart and various tests results, discussions about plan of care and coordination of care plan   Heath Lark, MD 10/05/2020 12:08 PM  INTERVAL HISTORY: Please see below for problem oriented charting. 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 Since last time I saw him, he has gained some weight He continues to have discharging necrotic tumor at the base of his tongue but it does not bother him His facial rash has improved His pain is well controlled  REVIEW OF SYSTEMS:   Constitutional: Denies fevers, chills or  abnormal weight loss Eyes: Denies blurriness of vision Ears, nose, mouth, throat, and face: Denies mucositis or sore throat Respiratory: Denies cough, dyspnea or wheezes Cardiovascular: Denies palpitation, chest discomfort or lower extremity swelling Lymphatics: Denies new lymphadenopathy or easy bruising Neurological:Denies numbness, tingling or new weaknesses Behavioral/Psych: Mood is stable, no new changes  All other systems were reviewed with the patient and are negative.  I have reviewed the past medical history, past surgical history, social history and family history with the patient and they are unchanged from previous note.  ALLERGIES:  is allergic to cetuximab.  MEDICATIONS:  Current Outpatient Medications  Medication Sig Dispense Refill   doxycycline (VIBRA-TABS) 100 MG tablet Take 1 tablet (100 mg total) by mouth 2 (two) times daily. 60 tablet 0   fluconazole (DIFLUCAN) 100 MG tablet Take 1 tablet (100 mg total) by mouth daily. 7 tablet 0   loratadine (CLARITIN) 10 MG tablet Take 10 mg by mouth daily as needed for allergies.     magic mouthwash (nystatin, lidocaine, diphenhydrAMINE, alum & mag hydroxide) suspension Swish and spit 5 mls by mouth 4 times a day as needed 240 mL 0   morphine (MS CONTIN) 60 MG 12 hr tablet Take 1 tablet (60 mg total) by mouth in the morning, at noon, and at bedtime. 90 tablet 0   morphine (MSIR) 30 MG tablet Take 1 tablet (30 mg total) by mouth every 4 (four) hours as needed for severe pain. 90 tablet 0   polyethylene glycol (MIRALAX / GLYCOLAX) 17 g packet Take 17 g by mouth daily.     predniSONE (DELTASONE) 5 MG tablet Take 1 tablet (  5 mg total) by mouth daily with breakfast. 30 tablet 1   prochlorperazine (COMPAZINE) 10 MG tablet Take 1 tablet (10 mg total) by mouth every 6 (six) hours as needed for nausea or vomiting. 30 tablet 3   senna (SENOKOT) 8.6 MG tablet Take 2 tablets by mouth 2 (two) times daily.     No current facility-administered  medications for this visit.   Facility-Administered Medications Ordered in Other Visits  Medication Dose Route Frequency Provider Last Rate Last Admin   cetuximab (ERBITUX) chemo infusion 300 mg  150 mg/m2 (Treatment Plan Recorded) Intravenous Once Alvy Bimler, Aubery Date, MD       heparin lock flush 100 unit/mL  500 Units Intracatheter Once PRN Alvy Bimler, Krithi Bray, MD       sodium chloride flush (NS) 0.9 % injection 10 mL  10 mL Intracatheter PRN Heath Lark, MD        SUMMARY OF ONCOLOGIC HISTORY: Oncology History  Tongue cancer ([place])  08/22/2011 Surgery   He had partial glossectomy which showed invasive SCC measured 1.3 cm   09/06/2011 Surgery   He underwent right neck dissection which showed 1/24 LN involved and repeat resection of tongue was negative   03/23/2012 Surgery   Repeat tngue resection showed well differentiated SCC 1.5 cm which invaded into the muscle   04/17/2012 Imaging   PET/CT scan showed New adenopathy in the left neck, compatible with recurrent malignancy   04/23/2012 Surgery   he underwent left neck LN dissection and 2/34 LN were positve   06/01/2012 - 07/13/2012 Chemotherapy   Patient had 3 cycles of cisplatin   06/12/2012 - 07/14/2012 Radiation Therapy   Patient completed RT   12/10/2012 Imaging   Interval resolution of previous hypermetabolic cervical adenopathy. There is nonspecific asymmetric increased uptake within the right side of tongue   03/13/2013 Imaging   PET/CT scan showed no evidence of disease recurrence. He was noted to have incidental kidney stones   08/15/2014 Imaging   CT scan of the dissection no evidence of disease   05/02/2020 Imaging   CT scan of neck 1. 4.3 x 2.7 x 2.7 cm peripherally enhancing mass at the anterior tongue base with focal sclerosis of the anterior genu of the mandible. 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 2. 8 mm peripherally enhancing left level 2 lymph node is concerning for  metastatic disease. Recommend PET scan of the neck to further characterize disease. 3. No other significant recurrent left-sided lymph nodes. 4. Post radiation changes of the carotid sheath bilaterally. 5. Post radiation changes at the lung apices bilaterally. 6. Polyps or mucous retention cysts within the maxillary sinuses bilaterally.   05/24/2020 Pathology Results   A. SUBMENTAL MASS, MIDLINE, NEEDLE CORE BIOPSY:  - Poorly differentiated sarcomatoid malignancy.   COMMENT:   CK7 is positive.  CKAE1/AE3, CK8/18, CK20, p40, CK5/6, S-100, Melan-A and Desmin are negative.  SMA demonstrates nonspecific staining.  Ki-67 proliferation index is high.  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     05/24/2020 Procedure   Successful ultrasound submental mass 18 gauge core biopsy   06/07/2020 PET scan   IMPRESSION: 1. Unfortunately evidence of squamous cell carcinoma recurrence in the floor of the mouth. Broad lesion with intense peripheral metabolic activity in the anterior RIGHT floor the mouth. 2. Bilateral hypermetabolic nodules within the sub submandibular which are new from PET-CT 2015. Concern for unusual metastasis to the submandibular glands. 3. New  small bilateral small pulmonary nodules with faint radiotracer activity. These are also concerning for metastasis. 4. Asymmetric hypermetabolic activity within the RIGHT testicle. Favor benign inflammation; however consider testicular ultrasound if concern for unusual pattern of metastasis.   06/15/2020 Procedure   Successful placement of a right internal jugular approach power injectable Port-A-Cath. The catheter is ready for immediate use.     06/19/2020 - 08/05/2020 Chemotherapy          08/21/2020 PET scan   1. Progressive tumor in the floor the mouth with overt permeative destructive changes involving the mandible. 2. Persistent hypermetabolic neck nodes as detailed above. No new  findings. 3. Progressive pulmonary metastatic disease. 4. No findings for abdominal/pelvic metastatic disease. 5. Persistent hypermetabolism in the right hemiscrotum most likely due to a varicocele.     08/29/2020 -  Chemotherapy    Patient is on Treatment Plan: HEAD/NECK CISPLATIN + DOCETAXEL Q21D PLUS CETUXIMAB    Patient is on Antibody Plan: HEAD/NECK CETUXIMAB Q7D     08/31/2020 -  Chemotherapy    Patient is on Treatment Plan: HEAD/NECK CISPLATIN + DOCETAXEL Q21D PLUS CETUXIMAB    Patient is on Antibody Plan: HEAD/NECK CETUXIMAB Q7D     Metastasis to lung ([place])  06/09/2020 Initial Diagnosis   Metastasis to lung ([place])   08/31/2020 -  Chemotherapy    Patient is on Treatment Plan: HEAD/NECK CISPLATIN + DOCETAXEL Q21D PLUS CETUXIMAB    Patient is on Antibody Plan: HEAD/NECK CETUXIMAB Q7D       PHYSICAL EXAMINATION: ECOG PERFORMANCE STATUS: 1 - Symptomatic but completely ambulatory  Vitals:   10/05/20 1101  BP: 126/71  Pulse: (!) 104  Resp: 18  Temp: 97.8 F (36.6 C)  SpO2: 100%   Filed Weights   10/05/20 1101  Weight: 196 lb 12.8 oz (89.3 kg)    GENERAL:alert, no distress and comfortable SKIN: His facial skin rash has improved EYES: normal, Conjunctiva are pink and non-injected, sclera clear OROPHARYNX: No oral thrush.  There is discharging pus at the base of his jaw LYMPH:  no palpable lymphadenopathy in the cervical, axillary or inguinal LUNGS: clear to auscultation and percussion with normal breathing effort HEART: regular rate & rhythm and no murmurs and no lower extremity edema ABDOMEN:abdomen soft, non-tender and normal bowel sounds Musculoskeletal:no cyanosis of digits and no clubbing  NEURO: alert & oriented x 3 with fluent speech, no focal motor/sensory deficits  LABORATORY DATA:  I have reviewed the data as listed    Component Value Date/Time   NA 139 10/05/2020 1044   NA 141 04/29/2016 1402   K 4.0 10/05/2020 1044   K 4.1 04/29/2016 1402   CL 102  10/05/2020 1044   CL 100 07/10/2012 1341   CO2 27 10/05/2020 1044   CO2 25 04/29/2016 1402   GLUCOSE 117 (H) 10/05/2020 1044   GLUCOSE 91 04/29/2016 1402   GLUCOSE 118 (H) 07/10/2012 1341   BUN 7 10/05/2020 1044   BUN 9.7 04/29/2016 1402   CREATININE 0.66 10/05/2020 1044   CREATININE 1.0 04/29/2016 1402   CALCIUM 9.5 10/05/2020 1044   CALCIUM 9.3 04/29/2016 1402   PROT 6.8 10/05/2020 1044   PROT 7.0 04/29/2016 1402   ALBUMIN 3.3 (L) 10/05/2020 1044   ALBUMIN 4.1 04/29/2016 1402   AST 15 10/05/2020 1044   AST 28 04/29/2016 1402   ALT 26 10/05/2020 1044   ALT 26 04/29/2016 1402   ALKPHOS 103 10/05/2020 1044   ALKPHOS 71 04/29/2016 1402   BILITOT 0.4  10/05/2020 1044   BILITOT 0.43 04/29/2016 1402   GFRNONAA >60 10/05/2020 1044   GFRAA >60 05/02/2017 0901    No results found for: SPEP, UPEP  Lab Results  Component Value Date   WBC 11.0 (H) 10/05/2020   NEUTROABS 9.7 (H) 10/05/2020   HGB 10.1 (L) 10/05/2020   HCT 31.7 (L) 10/05/2020   MCV 88.8 10/05/2020   PLT 262 10/05/2020      Chemistry      Component Value Date/Time   NA 139 10/05/2020 1044   NA 141 04/29/2016 1402   K 4.0 10/05/2020 1044   K 4.1 04/29/2016 1402   CL 102 10/05/2020 1044   CL 100 07/10/2012 1341   CO2 27 10/05/2020 1044   CO2 25 04/29/2016 1402   BUN 7 10/05/2020 1044   BUN 9.7 04/29/2016 1402   CREATININE 0.66 10/05/2020 1044   CREATININE 1.0 04/29/2016 1402      Component Value Date/Time   CALCIUM 9.5 10/05/2020 1044   CALCIUM 9.3 04/29/2016 1402   ALKPHOS 103 10/05/2020 1044   ALKPHOS 71 04/29/2016 1402   AST 15 10/05/2020 1044   AST 28 04/29/2016 1402   ALT 26 10/05/2020 1044   ALT 26 04/29/2016 1402   BILITOT 0.4 10/05/2020 1044   BILITOT 0.43 04/29/2016 1402

## 2020-10-05 NOTE — Research (Signed)
[id number] Study: Gave patient the 3 months questionnaires for this study to take home for him and his wife to complete. Requested he return completed questionnaires next week when he comes in for his next appointment and to please call research nurse if any questions. Patient verbalized understanding/ agreement. He confirmed that he and his wife have been in contact with the Financial Navigator for this study. Thanked patient for his time and ongoing participation in this study.  Foye Spurling, BSN, RN Clinical Research Nurse 10/05/2020 11:22 AM

## 2020-10-09 MED FILL — Dexamethasone Sodium Phosphate Inj 100 MG/10ML: INTRAMUSCULAR | Qty: 1 | Status: AC

## 2020-10-09 MED FILL — Fosaprepitant Dimeglumine For IV Infusion 150 MG (Base Eq): INTRAVENOUS | Qty: 5 | Status: AC

## 2020-10-10 ENCOUNTER — Inpatient Hospital Stay: Payer: BC Managed Care – PPO

## 2020-10-10 ENCOUNTER — Other Ambulatory Visit: Payer: Self-pay

## 2020-10-10 VITALS — BP 93/72 | HR 96 | Temp 98.2°F | Resp 20 | Wt 189.2 lb

## 2020-10-10 DIAGNOSIS — C029 Malignant neoplasm of tongue, unspecified: Secondary | ICD-10-CM

## 2020-10-10 DIAGNOSIS — Z5112 Encounter for antineoplastic immunotherapy: Secondary | ICD-10-CM | POA: Diagnosis not present

## 2020-10-10 MED ORDER — POTASSIUM CHLORIDE IN NACL 20-0.9 MEQ/L-% IV SOLN
Freq: Once | INTRAVENOUS | Status: AC
  Filled 2020-10-10: qty 1000

## 2020-10-10 MED ORDER — SODIUM CHLORIDE 0.9 % IV SOLN: Freq: Once | INTRAVENOUS | Status: AC

## 2020-10-10 MED ORDER — SODIUM CHLORIDE 0.9 % IV SOLN
75.0000 mg/m2 | Freq: Once | INTRAVENOUS | Status: AC
  Administered 2020-10-10: 164 mg via INTRAVENOUS
  Filled 2020-10-10: qty 164

## 2020-10-10 MED ORDER — SODIUM CHLORIDE 0.9 % IV SOLN
60.0000 mg/m2 | Freq: Once | INTRAVENOUS | Status: AC
  Administered 2020-10-10: 130 mg via INTRAVENOUS
  Filled 2020-10-10: qty 13

## 2020-10-10 MED ORDER — SODIUM CHLORIDE 0.9 % IV SOLN
150.0000 mg | Freq: Once | INTRAVENOUS | Status: AC
  Administered 2020-10-10: 150 mg via INTRAVENOUS
  Filled 2020-10-10: qty 150

## 2020-10-10 MED ORDER — MAGNESIUM SULFATE 2 GM/50ML IV SOLN
2.0000 g | Freq: Once | INTRAVENOUS | Status: AC
  Administered 2020-10-10: 2 g via INTRAVENOUS
  Filled 2020-10-10: qty 50

## 2020-10-10 MED ORDER — SODIUM CHLORIDE 0.9 % IV SOLN
10.0000 mg | Freq: Once | INTRAVENOUS | Status: AC
  Administered 2020-10-10: 10 mg via INTRAVENOUS
  Filled 2020-10-10: qty 10

## 2020-10-10 MED ORDER — PALONOSETRON HCL INJECTION 0.25 MG/5ML
0.2500 mg | Freq: Once | INTRAVENOUS | Status: AC
  Administered 2020-10-10: 0.25 mg via INTRAVENOUS
  Filled 2020-10-10: qty 5

## 2020-10-10 MED ORDER — SODIUM CHLORIDE 0.9% FLUSH
10.0000 mL | INTRAVENOUS | Status: DC | PRN
  Administered 2020-10-10: 10 mL

## 2020-10-10 MED ORDER — HEPARIN SOD (PORK) LOCK FLUSH 100 UNIT/ML IV SOLN
500.0000 [IU] | Freq: Once | INTRAVENOUS | Status: AC | PRN
  Administered 2020-10-10: 500 [IU]

## 2020-10-10 NOTE — Patient Instructions (Signed)
[facility]  Discharge Instructions: Thank you for choosing [facility] to provide your oncology and hematology care.   If you have a lab appointment with the [facility], please go directly to the [facility] and check in at the registration area.   Wear comfortable clothing and clothing appropriate for easy access to any Portacath or PICC line.   We strive to give you quality time with your provider. You may need to reschedule your appointment if you arrive late (15 or more minutes).  Arriving late affects you and other patients whose appointments are after yours.  Also, if you miss three or more appointments without notifying the office, you may be dismissed from the clinic at the provider's discretion.      For prescription refill requests, have your pharmacy contact our office and allow 72 hours for refills to be completed.    Today you received the following chemotherapy and/or immunotherapy agents :  Cisplatin & Docetaxel       To help prevent nausea and vomiting after your treatment, we encourage you to take your nausea medication as directed.  BELOW ARE SYMPTOMS THAT SHOULD BE REPORTED IMMEDIATELY: *FEVER GREATER THAN 100.4 F (38 C) OR HIGHER *CHILLS OR SWEATING *NAUSEA AND VOMITING THAT IS NOT CONTROLLED WITH YOUR NAUSEA MEDICATION *UNUSUAL SHORTNESS OF BREATH *UNUSUAL BRUISING OR BLEEDING *URINARY PROBLEMS (pain or burning when urinating, or frequent urination) *BOWEL PROBLEMS (unusual diarrhea, constipation, pain near the anus) TENDERNESS IN MOUTH AND THROAT WITH OR WITHOUT PRESENCE OF ULCERS (sore throat, sores in mouth, or a toothache) UNUSUAL RASH, SWELLING OR PAIN  UNUSUAL VAGINAL DISCHARGE OR ITCHING   Items with * indicate a potential emergency and should be followed up as soon as possible or go to the Emergency Department if any problems should occur.  Please show the CHEMOTHERAPY ALERT CARD or IMMUNOTHERAPY ALERT CARD  at check-in to the Emergency Department and triage nurse.  Should you have questions after your visit or need to cancel or reschedule your appointment, please contact [facility]  Dept: [phone]  and follow the prompts.  Office hours are 8:00 a.m. to 4:30 p.m. Monday - Friday. Please note that voicemails left after 4:00 p.m. may not be returned until the following business day.  We are closed weekends and major holidays. You have access to a nurse at all times for urgent questions. Please call the main number to the clinic Dept: [phone] and follow the prompts.   For any non-urgent questions, you may also contact your provider using MyChart. We now offer e-Visits for anyone 20 and older to request care online for non-urgent symptoms. For details visit [email or web address].   Also download the MyChart app! Go to the app store, search "MyChart", open the app, select [facility], and log in with your MyChart username and password.  Due to Covid, a mask is required upon entering the hospital/clinic. If you do not have a mask, one will be given to you upon arrival. For doctor visits, patients may have 1 support person aged 75 or older with them. For treatment visits, patients cannot have anyone with them due to current Covid guidelines and our immunocompromised population.

## 2020-10-12 ENCOUNTER — Other Ambulatory Visit: Payer: Self-pay | Admitting: Hematology and Oncology

## 2020-10-12 ENCOUNTER — Inpatient Hospital Stay: Payer: BC Managed Care – PPO

## 2020-10-12 ENCOUNTER — Encounter: Payer: Self-pay | Admitting: *Deleted

## 2020-10-12 ENCOUNTER — Encounter: Payer: Self-pay | Admitting: Hematology and Oncology

## 2020-10-12 ENCOUNTER — Other Ambulatory Visit: Payer: Self-pay

## 2020-10-12 ENCOUNTER — Inpatient Hospital Stay (HOSPITAL_BASED_OUTPATIENT_CLINIC_OR_DEPARTMENT_OTHER): Payer: BC Managed Care – PPO | Admitting: Hematology and Oncology

## 2020-10-12 VITALS — BP 105/74 | HR 86 | Resp 19

## 2020-10-12 DIAGNOSIS — C029 Malignant neoplasm of tongue, unspecified: Secondary | ICD-10-CM

## 2020-10-12 DIAGNOSIS — L270 Generalized skin eruption due to drugs and medicaments taken internally: Secondary | ICD-10-CM | POA: Diagnosis not present

## 2020-10-12 DIAGNOSIS — C7802 Secondary malignant neoplasm of left lung: Secondary | ICD-10-CM

## 2020-10-12 DIAGNOSIS — C7801 Secondary malignant neoplasm of right lung: Secondary | ICD-10-CM

## 2020-10-12 DIAGNOSIS — Z5112 Encounter for antineoplastic immunotherapy: Secondary | ICD-10-CM | POA: Diagnosis not present

## 2020-10-12 DIAGNOSIS — R110 Nausea: Secondary | ICD-10-CM | POA: Diagnosis not present

## 2020-10-12 LAB — CBC WITH DIFFERENTIAL (CANCER CENTER ONLY)
Abs Immature Granulocytes: 0.13 10*3/uL — ABNORMAL HIGH (ref 0.00–0.07)
Basophils Absolute: 0 10*3/uL (ref 0.0–0.1)
Basophils Relative: 0 %
Eosinophils Absolute: 0 10*3/uL (ref 0.0–0.5)
Eosinophils Relative: 0 %
HCT: 31.2 % — ABNORMAL LOW (ref 39.0–52.0)
Hemoglobin: 10.3 g/dL — ABNORMAL LOW (ref 13.0–17.0)
Immature Granulocytes: 1 %
Lymphocytes Relative: 1 %
Lymphs Abs: 0.2 10*3/uL — ABNORMAL LOW (ref 0.7–4.0)
MCH: 28.9 pg (ref 26.0–34.0)
MCHC: 33 g/dL (ref 30.0–36.0)
MCV: 87.4 fL (ref 80.0–100.0)
Monocytes Absolute: 0.3 10*3/uL (ref 0.1–1.0)
Monocytes Relative: 2 %
Neutro Abs: 13.6 10*3/uL — ABNORMAL HIGH (ref 1.7–7.7)
Neutrophils Relative %: 96 %
Platelet Count: 334 10*3/uL (ref 150–400)
RBC: 3.57 MIL/uL — ABNORMAL LOW (ref 4.22–5.81)
RDW: 18 % — ABNORMAL HIGH (ref 11.5–15.5)
WBC Count: 14.1 10*3/uL — ABNORMAL HIGH (ref 4.0–10.5)
nRBC: 0 % (ref 0.0–0.2)

## 2020-10-12 LAB — CMP (CANCER CENTER ONLY)
ALT: 34 U/L (ref 0–44)
AST: 20 U/L (ref 15–41)
Albumin: 3 g/dL — ABNORMAL LOW (ref 3.5–5.0)
Alkaline Phosphatase: 95 U/L (ref 38–126)
Anion gap: 10 (ref 5–15)
BUN: 21 mg/dL — ABNORMAL HIGH (ref 6–20)
CO2: 23 mmol/L (ref 22–32)
Calcium: 8.6 mg/dL — ABNORMAL LOW (ref 8.9–10.3)
Chloride: 102 mmol/L (ref 98–111)
Creatinine: 0.73 mg/dL (ref 0.61–1.24)
GFR, Estimated: >60 mL/min (ref >60)
Glucose, Bld: 166 mg/dL — ABNORMAL HIGH (ref 70–99)
Potassium: 3.3 mmol/L — ABNORMAL LOW (ref 3.5–5.1)
Sodium: 135 mmol/L (ref 135–145)
Total Bilirubin: 0.3 mg/dL (ref 0.3–1.2)
Total Protein: 6.6 g/dL (ref 6.5–8.1)

## 2020-10-12 LAB — MAGNESIUM: Magnesium: 1.5 mg/dL — ABNORMAL LOW (ref 1.7–2.4)

## 2020-10-12 MED ORDER — PEGFILGRASTIM-JMDB 6 MG/0.6ML ~~LOC~~ SOSY
6.0000 mg | PREFILLED_SYRINGE | Freq: Once | SUBCUTANEOUS | Status: AC
  Administered 2020-10-12: 6 mg via SUBCUTANEOUS
  Filled 2020-10-12: qty 0.6

## 2020-10-12 MED ORDER — HEPARIN SOD (PORK) LOCK FLUSH 100 UNIT/ML IV SOLN
500.0000 [IU] | Freq: Once | INTRAVENOUS | Status: AC | PRN
  Administered 2020-10-12: 500 [IU]

## 2020-10-12 MED ORDER — ONDANSETRON HCL 4 MG/2ML IJ SOLN
8.0000 mg | Freq: Once | INTRAMUSCULAR | Status: AC
  Administered 2020-10-12: 8 mg via INTRAVENOUS
  Filled 2020-10-12: qty 4

## 2020-10-12 MED ORDER — SODIUM CHLORIDE 0.9 % IV SOLN: Freq: Once | INTRAVENOUS | Status: AC

## 2020-10-12 MED ORDER — SODIUM CHLORIDE 0.9 % IV SOLN
10.0000 mg | Freq: Once | INTRAVENOUS | Status: AC
  Administered 2020-10-12: 10 mg via INTRAVENOUS
  Filled 2020-10-12: qty 10

## 2020-10-12 MED ORDER — SODIUM CHLORIDE 0.9 % IV SOLN: Freq: Once | INTRAVENOUS | Status: DC

## 2020-10-12 MED ORDER — MAGNESIUM SULFATE 2 GM/50ML IV SOLN
2.0000 g | Freq: Once | INTRAVENOUS | Status: AC
  Administered 2020-10-12: 2 g via INTRAVENOUS
  Filled 2020-10-12: qty 50

## 2020-10-12 MED ORDER — SODIUM CHLORIDE 0.9% FLUSH
10.0000 mL | Freq: Once | INTRAVENOUS | Status: AC
  Administered 2020-10-12: 10 mL

## 2020-10-12 MED ORDER — SODIUM CHLORIDE 0.9% FLUSH
10.0000 mL | Freq: Once | INTRAVENOUS | Status: AC | PRN
  Administered 2020-10-12: 10 mL

## 2020-10-12 NOTE — Assessment & Plan Note (Signed)
This is stable He will continue antibiotics and we will omit cetuximab

## 2020-10-12 NOTE — Assessment & Plan Note (Signed)
This is likely due to recent chemo We will give him IV fluids, IV steroids and IV antiemetics He will continue Compazine as needed at home

## 2020-10-12 NOTE — Progress Notes (Signed)
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   All questions were answered. The patient knows to call the clinic with any problems, questions or concerns. The total time spent in the appointment was 30 minutes encounter with patients including review of chart and various tests results, discussions about plan of care and coordination of care plan   Heath Lark, MD 10/12/2020 10:34 AM  INTERVAL HISTORY: Please see below for problem oriented charting. he returns for treatment follow-up and cetuximab treatment He complained of severe nausea since last night The discharging wound at the base of his neck is stable His chronic pain is stable He tapered off prednisone completely recently He denies dizziness, cough or shortness of breath Denies constipation  REVIEW OF SYSTEMS:   Constitutional: Denies fevers, chills  Eyes: Denies blurriness of vision Ears, nose, mouth, throat, and face: Denies  mucositis or sore throat Respiratory: Denies cough, dyspnea or wheezes Cardiovascular: Denies palpitation, chest discomfort or lower extremity swelling Lymphatics: Denies new lymphadenopathy or easy bruising Neurological:Denies numbness, tingling or new weaknesses Behavioral/Psych: Mood is stable, no new changes  All other systems were reviewed with the patient and are negative.  I have reviewed the past medical history, past surgical history, social history and family history with the patient and they are unchanged from previous note.  ALLERGIES:  is allergic to cetuximab.  MEDICATIONS:  Current Outpatient Medications  Medication Sig Dispense Refill   doxycycline (VIBRA-TABS) 100 MG tablet Take 1 tablet (100 mg total) by mouth 2 (two) times daily. 60 tablet 0   fluconazole (DIFLUCAN) 100 MG tablet Take 1 tablet (100 mg total) by mouth daily. 7 tablet 0   loratadine (CLARITIN) 10 MG tablet Take 10 mg by mouth daily as needed for allergies.     magic mouthwash (nystatin, lidocaine, diphenhydrAMINE, alum & mag hydroxide) suspension Swish and spit 5 mls by mouth 4 times a day as needed 240 mL 0   morphine (MS CONTIN) 60 MG 12 hr tablet Take 1 tablet (60 mg total) by mouth in the morning, at noon, and at bedtime. 90 tablet 0   morphine (MSIR) 30 MG tablet Take 1 tablet (30 mg total) by mouth every 4 (four) hours as needed for severe pain. 90 tablet 0   polyethylene glycol (MIRALAX / GLYCOLAX) 17 g packet Take 17 g by mouth daily.     predniSONE (DELTASONE) 5 MG tablet Take 1 tablet (5 mg total) by mouth daily with breakfast. 30 tablet 1   prochlorperazine (COMPAZINE) 10 MG tablet Take 1  tablet (10 mg total) by mouth every 6 (six) hours as needed for nausea or vomiting. 30 tablet 3   senna (SENOKOT) 8.6 MG tablet Take 2 tablets by mouth 2 (two) times daily.     No current facility-administered medications for this visit.   Facility-Administered Medications Ordered in Other Visits  Medication  Dose Route Frequency Provider Last Rate Last Admin   dexamethasone (DECADRON) 10 mg in sodium chloride 0.9 % 50 mL IVPB  10 mg Intravenous Once Alvy Bimler, Dash Cardarelli, MD 204 mL/hr at 10/12/20 1026 10 mg at 10/12/20 1026   magnesium sulfate IVPB 2 g 50 mL  2 g Intravenous Once Alvy Bimler, Lisanne Ponce, MD       pegfilgrastim-jmdb (FULPHILA) injection 6 mg  6 mg Subcutaneous Once Alvy Bimler, Alphonza Tramell, MD       sodium chloride flush (NS) 0.9 % injection 10 mL  10 mL Intracatheter Once PRN Heath Lark, MD        SUMMARY OF ONCOLOGIC HISTORY: Oncology History  Tongue cancer ([place])  08/22/2011 Surgery   He had partial glossectomy which showed invasive SCC measured 1.3 cm   09/06/2011 Surgery   He underwent right neck dissection which showed 1/24 LN involved and repeat resection of tongue was negative   03/23/2012 Surgery   Repeat tngue resection showed well differentiated SCC 1.5 cm which invaded into the muscle   04/17/2012 Imaging   PET/CT scan showed New adenopathy in the left neck, compatible with recurrent malignancy   04/23/2012 Surgery   he underwent left neck LN dissection and 2/34 LN were positve   06/01/2012 - 07/13/2012 Chemotherapy   Patient had 3 cycles of cisplatin   06/12/2012 - 07/14/2012 Radiation Therapy   Patient completed RT   12/10/2012 Imaging   Interval resolution of previous hypermetabolic cervical adenopathy. There is nonspecific asymmetric increased uptake within the right side of tongue   03/13/2013 Imaging   PET/CT scan showed no evidence of disease recurrence. He was noted to have incidental kidney stones   08/15/2014 Imaging   CT scan of the dissection no evidence of disease   05/02/2020 Imaging   CT scan of neck 1. 4.3 x 2.7 x 2.7 cm peripherally enhancing mass at the anterior tongue base with focal sclerosis of the anterior genu of the mandible. 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 2. 8 mm peripherally enhancing left level 2 lymph  node is concerning for metastatic disease. Recommend PET scan of the neck to further characterize disease. 3. No other significant recurrent left-sided lymph nodes. 4. Post radiation changes of the carotid sheath bilaterally. 5. Post radiation changes at the lung apices bilaterally. 6. Polyps or mucous retention cysts within the maxillary sinuses bilaterally.   05/24/2020 Pathology Results   A. SUBMENTAL MASS, MIDLINE, NEEDLE CORE BIOPSY:  - Poorly differentiated sarcomatoid malignancy.   COMMENT:   CK7 is positive.  CKAE1/AE3, CK8/18, CK20, p40, CK5/6, S-100, Melan-A and Desmin are negative.  SMA demonstrates nonspecific staining.  Ki-67 proliferation index is high.  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     05/24/2020 Procedure   Successful ultrasound submental mass 18 gauge core biopsy   06/07/2020 PET scan   IMPRESSION: 1. Unfortunately evidence of squamous cell carcinoma recurrence in the floor of the mouth. Broad lesion with intense peripheral metabolic activity in the anterior RIGHT floor the mouth. 2. Bilateral hypermetabolic nodules within the sub submandibular which are new from PET-CT 2015.  Concern for unusual metastasis to the submandibular glands. 3. New small bilateral small pulmonary nodules with faint radiotracer activity. These are also concerning for metastasis. 4. Asymmetric hypermetabolic activity within the RIGHT testicle. Favor benign inflammation; however consider testicular ultrasound if concern for unusual pattern of metastasis.   06/15/2020 Procedure   Successful placement of a right internal jugular approach power injectable Port-A-Cath. The catheter is ready for immediate use.     06/19/2020 - 08/05/2020 Chemotherapy          08/21/2020 PET scan   1. Progressive tumor in the floor the mouth with overt permeative destructive changes involving the mandible. 2. Persistent hypermetabolic neck nodes as  detailed above. No new findings. 3. Progressive pulmonary metastatic disease. 4. No findings for abdominal/pelvic metastatic disease. 5. Persistent hypermetabolism in the right hemiscrotum most likely due to a varicocele.     08/29/2020 -  Chemotherapy    Patient is on Treatment Plan: HEAD/NECK CISPLATIN + DOCETAXEL Q21D PLUS CETUXIMAB    Patient is on Antibody Plan: HEAD/NECK CETUXIMAB Q7D     08/31/2020 -  Chemotherapy    Patient is on Treatment Plan: HEAD/NECK CISPLATIN + DOCETAXEL Q21D PLUS CETUXIMAB    Patient is on Antibody Plan: HEAD/NECK CETUXIMAB Q7D     Metastasis to lung ([place])  06/09/2020 Initial Diagnosis   Metastasis to lung ([place])   08/31/2020 -  Chemotherapy    Patient is on Treatment Plan: HEAD/NECK CISPLATIN + DOCETAXEL Q21D PLUS CETUXIMAB    Patient is on Antibody Plan: HEAD/NECK CETUXIMAB Q7D       PHYSICAL EXAMINATION: ECOG PERFORMANCE STATUS: 1 - Symptomatic but completely ambulatory  Vitals:   10/12/20 0935  BP: 122/66  Pulse: 97  Resp: 20  Temp: (!) 97 F (36.1 C)  SpO2: 100%   Filed Weights   10/12/20 0935  Weight: 191 lb 12.8 oz (87 kg)    GENERAL:alert, no distress and comfortable SKIN: The rash on his face is stable/improved EYES: normal, Conjunctiva are pink and non-injected, sclera clear OROPHARYNX: Noted debris in his mouth.  No thrush NECK: Noted persistent discharge at the base of his neck, stable LYMPH:  no palpable lymphadenopathy in the cervical, axillary or inguinal LUNGS: clear to auscultation and percussion with normal breathing effort HEART: regular rate & rhythm and no murmurs and no lower extremity edema ABDOMEN:abdomen soft, non-tender and normal bowel sounds Musculoskeletal:no cyanosis of digits and no clubbing  NEURO: alert & oriented x 3 with fluent speech, no focal motor/sensory deficits  LABORATORY DATA:  I have reviewed the data as listed    Component Value Date/Time   NA 135 10/12/2020 0924   NA 141 04/29/2016  1402   K 3.3 (L) 10/12/2020 0924   K 4.1 04/29/2016 1402   CL 102 10/12/2020 0924   CL 100 07/10/2012 1341   CO2 23 10/12/2020 0924   CO2 25 04/29/2016 1402   GLUCOSE 166 (H) 10/12/2020 0924   GLUCOSE 91 04/29/2016 1402   GLUCOSE 118 (H) 07/10/2012 1341   BUN 21 (H) 10/12/2020 0924   BUN 9.7 04/29/2016 1402   CREATININE 0.73 10/12/2020 0924   CREATININE 1.0 04/29/2016 1402   CALCIUM 8.6 (L) 10/12/2020 0924   CALCIUM 9.3 04/29/2016 1402   PROT 6.6 10/12/2020 0924   PROT 7.0 04/29/2016 1402   ALBUMIN 3.0 (L) 10/12/2020 0924   ALBUMIN 4.1 04/29/2016 1402   AST 20 10/12/2020 0924   AST 28 04/29/2016 1402   ALT 34 10/12/2020 0924   ALT  26 04/29/2016 1402   ALKPHOS 95 10/12/2020 0924   ALKPHOS 71 04/29/2016 1402   BILITOT 0.3 10/12/2020 0924   BILITOT 0.43 04/29/2016 1402   GFRNONAA >60 10/12/2020 0924   GFRAA >60 05/02/2017 0901    No results found for: SPEP, UPEP  Lab Results  Component Value Date   WBC 14.1 (H) 10/12/2020   NEUTROABS 13.6 (H) 10/12/2020   HGB 10.3 (L) 10/12/2020   HCT 31.2 (L) 10/12/2020   MCV 87.4 10/12/2020   PLT 334 10/12/2020      Chemistry      Component Value Date/Time   NA 135 10/12/2020 0924   NA 141 04/29/2016 1402   K 3.3 (L) 10/12/2020 0924   K 4.1 04/29/2016 1402   CL 102 10/12/2020 0924   CL 100 07/10/2012 1341   CO2 23 10/12/2020 0924   CO2 25 04/29/2016 1402   BUN 21 (H) 10/12/2020 0924   BUN 9.7 04/29/2016 1402   CREATININE 0.73 10/12/2020 0924   CREATININE 1.0 04/29/2016 1402      Component Value Date/Time   CALCIUM 8.6 (L) 10/12/2020 0924   CALCIUM 9.3 04/29/2016 1402   ALKPHOS 95 10/12/2020 0924   ALKPHOS 71 04/29/2016 1402   AST 20 10/12/2020 0924   AST 28 04/29/2016 1402   ALT 34 10/12/2020 0924   ALT 26 04/29/2016 1402   BILITOT 0.3 10/12/2020 0924   BILITOT 0.43 04/29/2016 1402

## 2020-10-12 NOTE — Assessment & Plan Note (Signed)
This is due to recent treatment I will give him IV magnesium today

## 2020-10-12 NOTE — Progress Notes (Signed)
[id number] Study: Met patient in infusion room to collect study questionnaires.  He reported he wasn't feeling well today and forgot to bring them with him.  He stated he will bring them on his appointment next week. Thanked patient and plan to collect the questionnaires on his appointment next week.  Foye Spurling, BSN, RN Clinical Research Nurse 10/12/2020 11:49 AM

## 2020-10-12 NOTE — Assessment & Plan Note (Signed)
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

## 2020-10-13 ENCOUNTER — Telehealth: Payer: Self-pay

## 2020-10-13 NOTE — Telephone Encounter (Signed)
Called and scheduled appt with Dr. Alvy Bimler on 10/3 at 0820. Wife is aware of date/time appt.

## 2020-10-16 ENCOUNTER — Telehealth: Payer: Self-pay | Admitting: *Deleted

## 2020-10-16 ENCOUNTER — Other Ambulatory Visit (HOSPITAL_COMMUNITY): Payer: Self-pay

## 2020-10-16 MED ORDER — NYSTATIN 100000 UNIT/ML MT SUSP
OROMUCOSAL | 1 refills | Status: DC
  Filled 2020-10-16: qty 240, 12d supply, fill #0

## 2020-10-16 MED ORDER — FLUCONAZOLE 100 MG PO TABS
100.0000 mg | ORAL_TABLET | Freq: Every day | ORAL | 0 refills | Status: DC
  Filled 2020-10-16: qty 7, 7d supply, fill #0

## 2020-10-16 NOTE — Telephone Encounter (Signed)
Pt wife Senica Nilo called stating pt is having reoccurrence of thrush. Per Dr.Gorsuch, called magic mouthwash order in and diflucan 100 mg to [facility] pharmacy. Called pt wife to inform her of prescriptions being refilled. Pt wife verbalized understanding.

## 2020-10-17 ENCOUNTER — Telehealth: Payer: Self-pay

## 2020-10-17 ENCOUNTER — Other Ambulatory Visit: Payer: Self-pay

## 2020-10-17 DIAGNOSIS — C029 Malignant neoplasm of tongue, unspecified: Secondary | ICD-10-CM

## 2020-10-17 NOTE — Telephone Encounter (Signed)
Patient notified of change in imaging.  Confirmed upcoming appointments and instructions.  No further needs at this time.

## 2020-10-17 NOTE — Telephone Encounter (Signed)
RN received notification that PET scan required P2P.    MD notified and P2P attempted, however pet scan was denied due to not meeting criteria.  MD obtained approval for CT Neck W Contrast, and CT Chest W Contrast.  Approval number [id number] Exp 12/15/2020.   RN notified central schedule.  CT scans set up for 9/28 @ 2pm, with arrival of 1:45. Liquids only 4 hours prior.    Voicemail left with patient to CB regarding changes.

## 2020-10-19 ENCOUNTER — Inpatient Hospital Stay: Payer: BC Managed Care – PPO

## 2020-10-19 ENCOUNTER — Other Ambulatory Visit: Payer: Self-pay | Admitting: Hematology and Oncology

## 2020-10-19 ENCOUNTER — Other Ambulatory Visit: Payer: Self-pay

## 2020-10-19 DIAGNOSIS — Z5112 Encounter for antineoplastic immunotherapy: Secondary | ICD-10-CM | POA: Diagnosis not present

## 2020-10-19 DIAGNOSIS — C7801 Secondary malignant neoplasm of right lung: Secondary | ICD-10-CM

## 2020-10-19 DIAGNOSIS — C7802 Secondary malignant neoplasm of left lung: Secondary | ICD-10-CM

## 2020-10-19 DIAGNOSIS — C029 Malignant neoplasm of tongue, unspecified: Secondary | ICD-10-CM

## 2020-10-19 LAB — CBC WITH DIFFERENTIAL (CANCER CENTER ONLY)
Abs Immature Granulocytes: 0.72 10*3/uL — ABNORMAL HIGH (ref 0.00–0.07)
Basophils Absolute: 0 10*3/uL (ref 0.0–0.1)
Basophils Relative: 0 %
Eosinophils Absolute: 0 10*3/uL (ref 0.0–0.5)
Eosinophils Relative: 0 %
HCT: 31 % — ABNORMAL LOW (ref 39.0–52.0)
Hemoglobin: 10.5 g/dL — ABNORMAL LOW (ref 13.0–17.0)
Immature Granulocytes: 9 %
Lymphocytes Relative: 4 %
Lymphs Abs: 0.3 10*3/uL — ABNORMAL LOW (ref 0.7–4.0)
MCH: 29.3 pg (ref 26.0–34.0)
MCHC: 33.9 g/dL (ref 30.0–36.0)
MCV: 86.6 fL (ref 80.0–100.0)
Monocytes Absolute: 1.1 10*3/uL — ABNORMAL HIGH (ref 0.1–1.0)
Monocytes Relative: 14 %
Neutro Abs: 5.7 10*3/uL (ref 1.7–7.7)
Neutrophils Relative %: 73 %
Platelet Count: 400 10*3/uL (ref 150–400)
RBC: 3.58 MIL/uL — ABNORMAL LOW (ref 4.22–5.81)
RDW: 16.1 % — ABNORMAL HIGH (ref 11.5–15.5)
WBC Count: 7.8 10*3/uL (ref 4.0–10.5)
nRBC: 0 % (ref 0.0–0.2)

## 2020-10-19 LAB — CMP (CANCER CENTER ONLY)
ALT: 50 U/L — ABNORMAL HIGH (ref 0–44)
AST: 15 U/L (ref 15–41)
Albumin: 3.2 g/dL — ABNORMAL LOW (ref 3.5–5.0)
Alkaline Phosphatase: 168 U/L — ABNORMAL HIGH (ref 38–126)
Anion gap: 12 (ref 5–15)
BUN: 5 mg/dL — ABNORMAL LOW (ref 6–20)
CO2: 24 mmol/L (ref 22–32)
Calcium: 9.1 mg/dL (ref 8.9–10.3)
Chloride: 99 mmol/L (ref 98–111)
Creatinine: 0.68 mg/dL (ref 0.61–1.24)
GFR, Estimated: >60 mL/min (ref >60)
Glucose, Bld: 117 mg/dL — ABNORMAL HIGH (ref 70–99)
Potassium: 3.5 mmol/L (ref 3.5–5.1)
Sodium: 135 mmol/L (ref 135–145)
Total Bilirubin: 0.4 mg/dL (ref 0.3–1.2)
Total Protein: 6.7 g/dL (ref 6.5–8.1)

## 2020-10-19 LAB — MAGNESIUM: Magnesium: 1.5 mg/dL — ABNORMAL LOW (ref 1.7–2.4)

## 2020-10-19 MED ORDER — METHYLPREDNISOLONE SODIUM SUCC 125 MG IJ SOLR
125.0000 mg | Freq: Once | INTRAMUSCULAR | Status: AC
  Administered 2020-10-19: 125 mg via INTRAVENOUS
  Filled 2020-10-19: qty 2

## 2020-10-19 MED ORDER — HEPARIN SOD (PORK) LOCK FLUSH 100 UNIT/ML IV SOLN
500.0000 [IU] | Freq: Once | INTRAVENOUS | Status: DC | PRN
Start: 2020-10-19 — End: 2020-10-19

## 2020-10-19 MED ORDER — SODIUM CHLORIDE 0.9 % IV SOLN: Freq: Once | INTRAVENOUS | Status: DC

## 2020-10-19 MED ORDER — DIPHENHYDRAMINE HCL 50 MG/ML IJ SOLN
25.0000 mg | Freq: Once | INTRAMUSCULAR | Status: AC
  Administered 2020-10-19: 25 mg via INTRAVENOUS
  Filled 2020-10-19: qty 1

## 2020-10-19 MED ORDER — CETUXIMAB CHEMO IV INJECTION 200 MG/100ML
150.0000 mg/m2 | Freq: Once | INTRAVENOUS | Status: AC
  Administered 2020-10-19: 300 mg via INTRAVENOUS
  Filled 2020-10-19: qty 100

## 2020-10-19 MED ORDER — SODIUM CHLORIDE 0.9% FLUSH
10.0000 mL | INTRAVENOUS | Status: DC | PRN
Start: 2020-10-19 — End: 2020-10-19

## 2020-10-19 MED ORDER — FAMOTIDINE 20 MG IN NS 100 ML IVPB
20.0000 mg | Freq: Once | INTRAVENOUS | Status: AC
  Administered 2020-10-19: 20 mg via INTRAVENOUS
  Filled 2020-10-19: qty 100

## 2020-10-19 MED ORDER — SODIUM CHLORIDE 0.9% FLUSH
10.0000 mL | Freq: Once | INTRAVENOUS | Status: AC
  Administered 2020-10-19: 10 mL

## 2020-10-19 MED ORDER — MAGNESIUM SULFATE 2 GM/50ML IV SOLN
2.0000 g | Freq: Once | INTRAVENOUS | Status: AC
  Administered 2020-10-19: 2 g via INTRAVENOUS
  Filled 2020-10-19: qty 50

## 2020-10-19 NOTE — Progress Notes (Signed)
Mg = 1.5 today. Dr. Chryl Heck ok for IV Mg 2 g today.  Kennith Center, Pharm.D., CPP 10/19/2020@11 :04 AM

## 2020-10-19 NOTE — Progress Notes (Signed)
I was asked to see this patient of Dr Alvy Bimler while she is away requiring some clarification for cetuximab administration Patient was due for it last week but he was severely nauseated so it was held. He is here for it today. He feels well, taking medication for thrush and has been able to swallow and eat well. No nausea or vomiting since last week. No skin rash No cough or SOB. No change in bowel habits or urinary habits He feels ready to proceed Brief history and PE done today, no absolute contraindication to cetuximab, so we will proceed with it along with Mg supplementation.  [name]

## 2020-10-25 ENCOUNTER — Ambulatory Visit (HOSPITAL_COMMUNITY): Payer: BC Managed Care – PPO

## 2020-10-25 ENCOUNTER — Encounter (HOSPITAL_COMMUNITY): Payer: Self-pay

## 2020-10-25 ENCOUNTER — Ambulatory Visit (HOSPITAL_COMMUNITY)
Admission: RE | Admit: 2020-10-25 | Discharge: 2020-10-25 | Disposition: A | Discharge status: Home / Self Care | Payer: BC Managed Care – PPO | Source: Ambulatory Visit | Attending: Hematology and Oncology | Admitting: Hematology and Oncology

## 2020-10-25 ENCOUNTER — Other Ambulatory Visit: Payer: Self-pay

## 2020-10-25 DIAGNOSIS — C029 Malignant neoplasm of tongue, unspecified: Secondary | ICD-10-CM | POA: Diagnosis present

## 2020-10-25 MED ORDER — IOHEXOL 350 MG/ML SOLN
60.0000 mL | Freq: Once | INTRAVENOUS | Status: AC | PRN
  Administered 2020-10-25: 60 mL via INTRAVENOUS

## 2020-10-27 ENCOUNTER — Telehealth: Payer: Self-pay | Admitting: *Deleted

## 2020-10-27 NOTE — Telephone Encounter (Signed)
[id number] Study; Reminded patient to bring completed questionnaires with him when he sees Dr. Alvy Bimler Monday morning. Informed him that research nurse will be out of the office Monday morning but he can just give them to Dr. Alvy Bimler or her nurse and I will collect them later. Patient agreed, verbalized understanding. Thanked patient for his time and participation.  Foye Spurling, BSN, RN Clinical Research Nurse 10/27/2020 11:46 AM

## 2020-10-30 ENCOUNTER — Other Ambulatory Visit: Payer: Self-pay

## 2020-10-30 ENCOUNTER — Encounter: Payer: Self-pay | Admitting: Hematology and Oncology

## 2020-10-30 ENCOUNTER — Inpatient Hospital Stay: Payer: BC Managed Care – PPO | Attending: Hematology and Oncology | Admitting: Hematology and Oncology

## 2020-10-30 ENCOUNTER — Encounter: Payer: Self-pay | Admitting: *Deleted

## 2020-10-30 ENCOUNTER — Other Ambulatory Visit (HOSPITAL_COMMUNITY): Payer: Self-pay

## 2020-10-30 DIAGNOSIS — Z5111 Encounter for antineoplastic chemotherapy: Secondary | ICD-10-CM | POA: Diagnosis present

## 2020-10-30 DIAGNOSIS — K1231 Oral mucositis (ulcerative) due to antineoplastic therapy: Secondary | ICD-10-CM | POA: Insufficient documentation

## 2020-10-30 DIAGNOSIS — C7800 Secondary malignant neoplasm of unspecified lung: Secondary | ICD-10-CM | POA: Insufficient documentation

## 2020-10-30 DIAGNOSIS — Z5189 Encounter for other specified aftercare: Secondary | ICD-10-CM | POA: Insufficient documentation

## 2020-10-30 DIAGNOSIS — Z5112 Encounter for antineoplastic immunotherapy: Secondary | ICD-10-CM | POA: Diagnosis not present

## 2020-10-30 DIAGNOSIS — G893 Neoplasm related pain (acute) (chronic): Secondary | ICD-10-CM | POA: Insufficient documentation

## 2020-10-30 DIAGNOSIS — R634 Abnormal weight loss: Secondary | ICD-10-CM | POA: Diagnosis not present

## 2020-10-30 DIAGNOSIS — C7802 Secondary malignant neoplasm of left lung: Secondary | ICD-10-CM

## 2020-10-30 DIAGNOSIS — C029 Malignant neoplasm of tongue, unspecified: Secondary | ICD-10-CM | POA: Insufficient documentation

## 2020-10-30 DIAGNOSIS — C7801 Secondary malignant neoplasm of right lung: Secondary | ICD-10-CM

## 2020-10-30 MED ORDER — NYSTATIN 100000 UNIT/ML MT SUSP
OROMUCOSAL | 1 refills | Status: DC
  Filled 2020-10-30: qty 240, 12d supply, fill #0
  Filled 2020-11-08: qty 240, 12d supply, fill #1

## 2020-10-30 NOTE — Assessment & Plan Note (Signed)
I have reviewed multiple CT imaging and PET CT scan with the patient and his wife We discussed the challenges due to insurance not approving a PET CT scan for direct comparison Even though the radiologist commented that there is more disease progression at the base of tongue area, in my opinion, that is untrue given his clinical impression Overall, I felt that he has achieved stable disease control with his current chemotherapy regimen I plan to resume cycle 4 of chemotherapy as soon as possible He has missed 1 dose of cetuximab recently, due to limitation of chemotherapy infusion room availability, we will get everything restarted next week I will see him prior to cetuximab infusion at the end of the month for toxicity review and supportive care The patient has intermittent weight loss and I will adjust the dose of his chemotherapy accordingly

## 2020-10-30 NOTE — Progress Notes (Signed)
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 and PET CT scan with the patient and his wife We discussed the challenges due to insurance not approving a PET CT scan for direct comparison Even though the radiologist commented that there is more disease progression at the base of tongue area, in my opinion, that is untrue given his clinical impression Overall, I felt that he has achieved stable disease control with his current chemotherapy regimen I plan to resume cycle 4 of chemotherapy as soon as possible He has missed 1 dose of cetuximab recently, due to limitation of chemotherapy infusion room availability, we will get everything restarted next week I will see him prior to cetuximab infusion at the end of the month for toxicity review and supportive care The patient has intermittent weight loss and I will adjust the dose of his chemotherapy accordingly  Metastasis to lung [facility]) The lung nodules are stable in size We will continue current regimen The changes seen on CT imaging is suggestive of recent infection He has completed a course of antibiotic therapy and will remain on doxycycline  Cancer associated pain His pain is better controlled He will continue current prescribed pain regimen  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   All questions were answered. The patient knows to call the  clinic with any problems, questions or concerns. The total time spent in the appointment was 40 minutes encounter with patients including review of chart and various tests results, discussions about plan of care and coordination of care plan   Heath Lark, MD 10/30/2020 11:24 AM  INTERVAL HISTORY: Please see below for problem oriented charting. he returns for treatment follow-up He is Accompanied by his wife today to review recent CT imaging test results Unfortunately, due to insurance approval process, he had CT imaging done instead of PET CT scan His pain is well controlled He continues to have intermittent mucositis that comes and goes  REVIEW OF SYSTEMS:   Constitutional: Denies fevers, chills or abnormal weight loss Eyes: Denies blurriness of vision Respiratory: Denies cough, dyspnea or wheezes Cardiovascular: Denies palpitation, chest discomfort or lower extremity swelling Gastrointestinal:  Denies nausea, heartburn or change in bowel habits Skin: Denies abnormal skin rashes Lymphatics: Denies new lymphadenopathy or easy bruising Neurological:Denies numbness, tingling or new weaknesses Behavioral/Psych: Mood is stable, no new changes  All other systems were reviewed with the patient and are negative.  I have reviewed the past medical history, past surgical history, social history and family history with the patient and they are unchanged from previous note.  ALLERGIES:  is allergic to cetuximab.  MEDICATIONS:  Current Outpatient Medications  Medication Sig Dispense Refill   doxycycline (VIBRA-TABS) 100 MG tablet Take 1 tablet (100 mg total) by mouth 2 (two) times daily. 60 tablet 0   fluconazole (DIFLUCAN) 100 MG tablet Take 1 tablet (100 mg total) by mouth daily. 7 tablet 0   loratadine (CLARITIN) 10 MG tablet Take 10 mg by mouth daily as  needed for allergies.     magic mouthwash (nystatin, lidocaine, diphenhydrAMINE, alum & mag hydroxide) suspension Swish and spit 5 mls 4  times a day as needed 240 mL 1   morphine (MS CONTIN) 60 MG 12 hr tablet Take 1 tablet (60 mg total) by mouth in the morning, at noon, and at bedtime. 90 tablet 0   morphine (MSIR) 30 MG tablet Take 1 tablet (30 mg total) by mouth every 4 (four) hours as needed for severe pain. 90 tablet 0   polyethylene glycol (MIRALAX / GLYCOLAX) 17 g packet Take 17 g by mouth daily.     predniSONE (DELTASONE) 5 MG tablet Take 1 tablet (5 mg total) by mouth daily with breakfast. 30 tablet 1   prochlorperazine (COMPAZINE) 10 MG tablet Take 1 tablet (10 mg total) by mouth every 6 (six) hours as needed for nausea or vomiting. 30 tablet 3   senna (SENOKOT) 8.6 MG tablet Take 2 tablets by mouth 2 (two) times daily.     No current facility-administered medications for this visit.    SUMMARY OF ONCOLOGIC HISTORY: Oncology History  Tongue cancer ([place])  08/22/2011 Surgery   He had partial glossectomy which showed invasive SCC measured 1.3 cm   09/06/2011 Surgery   He underwent right neck dissection which showed 1/24 LN involved and repeat resection of tongue was negative   03/23/2012 Surgery   Repeat tngue resection showed well differentiated SCC 1.5 cm which invaded into the muscle   04/17/2012 Imaging   PET/CT scan showed New adenopathy in the left neck, compatible with recurrent malignancy   04/23/2012 Surgery   he underwent left neck LN dissection and 2/34 LN were positve   06/01/2012 - 07/13/2012 Chemotherapy   Patient had 3 cycles of cisplatin   06/12/2012 - 07/14/2012 Radiation Therapy   Patient completed RT   12/10/2012 Imaging   Interval resolution of previous hypermetabolic cervical adenopathy. There is nonspecific asymmetric increased uptake within the right side of tongue   03/13/2013 Imaging   PET/CT scan showed no evidence of disease recurrence. He was noted to have incidental kidney stones   08/15/2014 Imaging   CT scan of the dissection no evidence of disease   05/02/2020 Imaging   CT scan of  neck 1. 4.3 x 2.7 x 2.7 cm peripherally enhancing mass at the anterior tongue base with focal sclerosis of the anterior genu of the mandible. 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 2. 8 mm peripherally enhancing left level 2 lymph node is concerning for metastatic disease. Recommend PET scan of the neck to further characterize disease. 3. No other significant recurrent left-sided lymph nodes. 4. Post radiation changes of the carotid sheath bilaterally. 5. Post radiation changes at the lung apices bilaterally. 6. Polyps or mucous retention cysts within the maxillary sinuses bilaterally.   05/24/2020 Pathology Results   A. SUBMENTAL MASS, MIDLINE, NEEDLE CORE BIOPSY:  - Poorly differentiated sarcomatoid malignancy.   COMMENT:   CK7 is positive.  CKAE1/AE3, CK8/18, CK20, p40, CK5/6, S-100, Melan-A and Desmin are negative.  SMA demonstrates nonspecific staining.  Ki-67 proliferation index is high.  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     05/24/2020 Procedure   Successful ultrasound submental mass 18 gauge core biopsy   06/07/2020 PET scan   IMPRESSION: 1. Unfortunately evidence of squamous cell carcinoma recurrence in the floor of the mouth. Broad lesion with intense peripheral metabolic activity  in the anterior RIGHT floor the mouth. 2. Bilateral hypermetabolic nodules within the sub submandibular which are new from PET-CT 2015. Concern for unusual metastasis to the submandibular glands. 3. New small bilateral small pulmonary nodules with faint radiotracer activity. These are also concerning for metastasis. 4. Asymmetric hypermetabolic activity within the RIGHT testicle. Favor benign inflammation; however consider testicular ultrasound if concern for unusual pattern of metastasis.   06/15/2020 Procedure   Successful placement of a right internal jugular approach power  injectable Port-A-Cath. The catheter is ready for immediate use.     06/19/2020 - 08/05/2020 Chemotherapy          08/21/2020 PET scan   1. Progressive tumor in the floor the mouth with overt permeative destructive changes involving the mandible. 2. Persistent hypermetabolic neck nodes as detailed above. No new findings. 3. Progressive pulmonary metastatic disease. 4. No findings for abdominal/pelvic metastatic disease. 5. Persistent hypermetabolism in the right hemiscrotum most likely due to a varicocele.     08/29/2020 -  Chemotherapy   Patient is on Treatment Plan : HEAD/NECK Cisplatin + Docetaxel q21d plus cetuximab      08/31/2020 -  Chemotherapy   Patient is on Treatment Plan : HEAD/NECK Cetuximab q7d     10/27/2020 Imaging   1. Allowing for differing technique, no change in bilateral solid pulmonary nodules. 2. Several ground-glass nodules in the RIGHT upper lobe and mild tree-in-bud pattern in the RIGHT lower lobe could indicate mild pulmonary infection. 3. No lymphadenopathy.     10/27/2020 Imaging   Considerable enlargement of tumor at the anterior tongue and floor of the mouth measuring approximately 6 x 5 x 3 cm, with areas of intratumoral necrosis, particularly anterior.   Considerable progression of lytic destructive involvement of the mandible, more extensive on the right side than the left.   Enlargement of a low-density malignant level 2 node on the left, 12 mm today compared with 7 mm in April.   Metastasis to lung ([place])  06/09/2020 Initial Diagnosis   Metastasis to lung ([place])   08/31/2020 -  Chemotherapy   Patient is on Treatment Plan : HEAD/NECK Cetuximab q7d       PHYSICAL EXAMINATION: ECOG PERFORMANCE STATUS: 1 - Symptomatic but completely ambulatory  Vitals:   10/30/20 0826  BP: 118/85  Pulse: 88  Resp: 18  Temp: 99.6 F (37.6 C)  SpO2: 99%   Filed Weights   10/30/20 0826  Weight: 189 lb (85.7 kg)    GENERAL:alert, no distress and  comfortable SKIN: skin color, texture, turgor are normal, no rashes or significant lesions EYES: normal, Conjunctiva are pink and non-injected, sclera clear OROPHARYNX: He has persistent intermittent discharge at the base of his tongue, stable compared to prior exam NECK: supple, thyroid normal size, non-tender, without nodularity LYMPH:  no palpable lymphadenopathy in the cervical, axillary or inguinal LUNGS: clear to auscultation and percussion with normal breathing effort HEART: regular rate & rhythm and no murmurs and no lower extremity edema ABDOMEN:abdomen soft, non-tender and normal bowel sounds Musculoskeletal:no cyanosis of digits and no clubbing  NEURO: alert & oriented x 3 with fluent speech, no focal motor/sensory deficits  LABORATORY DATA:  I have reviewed the data as listed    Component Value Date/Time   NA 135 10/19/2020 0912   NA 141 04/29/2016 1402   K 3.5 10/19/2020 0912   K 4.1 04/29/2016 1402   CL 99 10/19/2020 0912   CL 100 07/10/2012 1341   CO2 24 10/19/2020 0912  CO2 25 04/29/2016 1402   GLUCOSE 117 (H) 10/19/2020 0912   GLUCOSE 91 04/29/2016 1402   GLUCOSE 118 (H) 07/10/2012 1341   BUN 5 (L) 10/19/2020 0912   BUN 9.7 04/29/2016 1402   CREATININE 0.68 10/19/2020 0912   CREATININE 1.0 04/29/2016 1402   CALCIUM 9.1 10/19/2020 0912   CALCIUM 9.3 04/29/2016 1402   PROT 6.7 10/19/2020 0912   PROT 7.0 04/29/2016 1402   ALBUMIN 3.2 (L) 10/19/2020 0912   ALBUMIN 4.1 04/29/2016 1402   AST 15 10/19/2020 0912   AST 28 04/29/2016 1402   ALT 50 (H) 10/19/2020 0912   ALT 26 04/29/2016 1402   ALKPHOS 168 (H) 10/19/2020 0912   ALKPHOS 71 04/29/2016 1402   BILITOT 0.4 10/19/2020 0912   BILITOT 0.43 04/29/2016 1402   GFRNONAA >60 10/19/2020 0912   GFRAA >60 05/02/2017 0901    No results found for: SPEP, UPEP  Lab Results  Component Value Date   WBC 7.8 10/19/2020   NEUTROABS 5.7 10/19/2020   HGB 10.5 (L) 10/19/2020   HCT 31.0 (L) 10/19/2020   MCV 86.6  10/19/2020   PLT 400 10/19/2020      Chemistry      Component Value Date/Time   NA 135 10/19/2020 0912   NA 141 04/29/2016 1402   K 3.5 10/19/2020 0912   K 4.1 04/29/2016 1402   CL 99 10/19/2020 0912   CL 100 07/10/2012 1341   CO2 24 10/19/2020 0912   CO2 25 04/29/2016 1402   BUN 5 (L) 10/19/2020 0912   BUN 9.7 04/29/2016 1402   CREATININE 0.68 10/19/2020 0912   CREATININE 1.0 04/29/2016 1402      Component Value Date/Time   CALCIUM 9.1 10/19/2020 0912   CALCIUM 9.3 04/29/2016 1402   ALKPHOS 168 (H) 10/19/2020 0912   ALKPHOS 71 04/29/2016 1402   AST 15 10/19/2020 0912   AST 28 04/29/2016 1402   ALT 50 (H) 10/19/2020 0912   ALT 26 04/29/2016 1402   BILITOT 0.4 10/19/2020 0912   BILITOT 0.43 04/29/2016 1402       RADIOGRAPHIC STUDIES: I have reviewed multiple imaging studies with the patient and his wife I have personally reviewed the radiological images as listed and agreed with the findings in the report. CT Soft Tissue Neck W Contrast  Result Date: 10/27/2020 CLINICAL DATA:  Restaging of tongue cancer. EXAM: CT NECK WITH CONTRAST TECHNIQUE: Multidetector CT imaging of the neck was performed using the standard protocol following the bolus administration of intravenous contrast. CONTRAST:  61m OMNIPAQUE IOHEXOL 350 MG/ML SOLN COMPARISON:  05/02/2020 CT.  PET scan 08/18/2020. FINDINGS: Pharynx and larynx: There is been considerable growth tumor of the anterior tongue and floor of the mouth now measuring approximately 6 x 5 x 3 cm. There appear to be areas intratumoral necrosis particularly anterior. There is been marked progression of lytic destruction of the mandible, extensively within the right body of the mandible the crossing the midline from right to left, now with involvement of the anterior left body. Salivary glands: Submandibular glands are atrophic or surgically absent. No primary parotid pathology. Bilateral parotid lymph nodes appear stable since the prior CT and  PET study. Thyroid: Normal Lymph nodes: Slight enlargement of a necrotic level 2 lymph node on the left axial image 40, now measuring 12 mm in diameter compared with 7 mm in diameter in April. Bilateral parotid lymph nodes appear stable since the prior examination is and I would favor that they are not  involved by malignancy. No other new lymph node. Vascular: Intimal thickening likely related to radiation. No flow limiting stenosis. Limited intracranial: Normal Visualized orbits: Limited, negative. Mastoids and visualized paranasal sinuses: Retention cyst in the left maxillary sinus. Skeleton: Extensive mandibular involvement as discussed above. Cervical spine appears negative. Upper chest: See results of chest CT. Other: None IMPRESSION: Considerable enlargement of tumor at the anterior tongue and floor of the mouth measuring approximately 6 x 5 x 3 cm, with areas of intratumoral necrosis, particularly anterior. Considerable progression of lytic destructive involvement of the mandible, more extensive on the right side than the left. Enlargement of a low-density malignant level 2 node on the left, 12 mm today compared with 7 mm in April. Electronically Signed   By: Nelson Chimes M.D.   On: 10/27/2020 10:10   CT Chest W Contrast  Result Date: 10/26/2020 CLINICAL DATA:  Head neck carcinoma.  Pulmonary metastasis EXAM: CT CHEST WITH CONTRAST TECHNIQUE: Multidetector CT imaging of the chest was performed during intravenous contrast administration. CONTRAST:  53m OMNIPAQUE IOHEXOL 350 MG/ML SOLN COMPARISON:  PET-CT 08/18/2020 FINDINGS: Cardiovascular: Port in the anterior chest wall with tip in distal SVC. Mediastinum/Nodes: No axillary or supraclavicular adenopathy. No mediastinal or hilar adenopathy. No pericardial fluid. Esophagus normal. Lungs/Pleura: Bilateral pulmonary nodules again demonstrated. Example: LEFT upper lobe rounded nodule measures 10 mm (image 58/series 7) compared with 8 mm on comparison CT from  most recent PET exam. LEFT lower lobe nodule measures 7 mm (image 80) compared with 8 mm. In the medial RIGHT middle lobe, 11 mm nodule (image 77) compares to 11 mm. Within the RIGHT upper lobe 6 mm nodule (image 45) compares to 6 mm. There several smudgy ground-glass nodules in the RIGHT upper lobe (image 38/series 7) which are not clearly seen on prior but there is differing technique. Subtle tree-in-bud pattern in the RIGHT lower lobe (image 59/series 7). Upper Abdomen: Limited view of the liver, kidneys, pancreas are unremarkable. Normal adrenal glands. Musculoskeletal: No aggressive osseous lesion. IMPRESSION: 1. Allowing for differing technique, no change in bilateral solid pulmonary nodules. 2. Several ground-glass nodules in the RIGHT upper lobe and mild tree-in-bud pattern in the RIGHT lower lobe could indicate mild pulmonary infection. 3. No lymphadenopathy. Electronically Signed   By: SSuzy BouchardM.D.   On: 10/26/2020 16:11

## 2020-10-30 NOTE — Progress Notes (Signed)
S1912CD Study: Per Dr. Alvy Bimler, patient and wife forgot to bring in their study questionnaires today. Patient will bring with him on his visit next week.  Foye Spurling, BSN, RN Clinical Research Nurse 10/30/2020 12:29 PM

## 2020-10-30 NOTE — Assessment & Plan Note (Signed)
His pain is better controlled He will continue current prescribed pain regimen

## 2020-10-30 NOTE — Assessment & Plan Note (Signed)
He has recurrent mucositis, stable on Magic mouthwash We will refill his prescription We will proceed with treatment next week as scheduled

## 2020-10-30 NOTE — Assessment & Plan Note (Signed)
The lung nodules are stable in size We will continue current regimen The changes seen on CT imaging is suggestive of recent infection He has completed a course of antibiotic therapy and will remain on doxycycline

## 2020-11-07 ENCOUNTER — Telehealth: Payer: Self-pay | Admitting: *Deleted

## 2020-11-07 MED FILL — Fosaprepitant Dimeglumine For IV Infusion 150 MG (Base Eq): INTRAVENOUS | Qty: 5 | Status: AC

## 2020-11-07 MED FILL — Dexamethasone Sodium Phosphate Inj 100 MG/10ML: INTRAMUSCULAR | Qty: 1 | Status: AC

## 2020-11-07 NOTE — Telephone Encounter (Signed)
S1912CD Study Asked patient's wife to remind patient to bring in the completed study questionnaires on tomorrow's visit. She agreed and denied any questions. Thanked her for her participation in this study.  Foye Spurling, BSN, RN Clinical Research Nurse 11/07/2020 1:22 PM

## 2020-11-08 ENCOUNTER — Other Ambulatory Visit (HOSPITAL_COMMUNITY): Payer: Self-pay

## 2020-11-08 ENCOUNTER — Inpatient Hospital Stay: Payer: BC Managed Care – PPO

## 2020-11-08 ENCOUNTER — Encounter: Payer: Self-pay | Admitting: *Deleted

## 2020-11-08 ENCOUNTER — Other Ambulatory Visit: Payer: Self-pay | Admitting: *Deleted

## 2020-11-08 ENCOUNTER — Other Ambulatory Visit: Payer: Self-pay

## 2020-11-08 VITALS — BP 125/77 | HR 99 | Temp 98.5°F | Resp 18 | Wt 187.2 lb

## 2020-11-08 DIAGNOSIS — C029 Malignant neoplasm of tongue, unspecified: Secondary | ICD-10-CM

## 2020-11-08 DIAGNOSIS — C7802 Secondary malignant neoplasm of left lung: Secondary | ICD-10-CM

## 2020-11-08 DIAGNOSIS — Z5112 Encounter for antineoplastic immunotherapy: Secondary | ICD-10-CM | POA: Diagnosis not present

## 2020-11-08 DIAGNOSIS — C7801 Secondary malignant neoplasm of right lung: Secondary | ICD-10-CM

## 2020-11-08 LAB — CBC WITH DIFFERENTIAL (CANCER CENTER ONLY)
Abs Immature Granulocytes: 0.07 10*3/uL (ref 0.00–0.07)
Basophils Absolute: 0.1 10*3/uL (ref 0.0–0.1)
Basophils Relative: 1 %
Eosinophils Absolute: 0.1 10*3/uL (ref 0.0–0.5)
Eosinophils Relative: 1 %
HCT: 29.5 % — ABNORMAL LOW (ref 39.0–52.0)
Hemoglobin: 9.7 g/dL — ABNORMAL LOW (ref 13.0–17.0)
Immature Granulocytes: 1 %
Lymphocytes Relative: 2 %
Lymphs Abs: 0.3 10*3/uL — ABNORMAL LOW (ref 0.7–4.0)
MCH: 29.2 pg (ref 26.0–34.0)
MCHC: 32.9 g/dL (ref 30.0–36.0)
MCV: 88.9 fL (ref 80.0–100.0)
Monocytes Absolute: 0.8 10*3/uL (ref 0.1–1.0)
Monocytes Relative: 8 %
Neutro Abs: 9.1 10*3/uL — ABNORMAL HIGH (ref 1.7–7.7)
Neutrophils Relative %: 87 %
Platelet Count: 318 10*3/uL (ref 150–400)
RBC: 3.32 MIL/uL — ABNORMAL LOW (ref 4.22–5.81)
RDW: 14.9 % (ref 11.5–15.5)
WBC Count: 10.4 10*3/uL (ref 4.0–10.5)
nRBC: 0 % (ref 0.0–0.2)

## 2020-11-08 LAB — CMP (CANCER CENTER ONLY)
ALT: 18 U/L (ref 0–44)
AST: 19 U/L (ref 15–41)
Albumin: 3 g/dL — ABNORMAL LOW (ref 3.5–5.0)
Alkaline Phosphatase: 91 U/L (ref 38–126)
Anion gap: 10 (ref 5–15)
BUN: 6 mg/dL (ref 6–20)
CO2: 28 mmol/L (ref 22–32)
Calcium: 9.1 mg/dL (ref 8.9–10.3)
Chloride: 103 mmol/L (ref 98–111)
Creatinine: 0.57 mg/dL — ABNORMAL LOW (ref 0.61–1.24)
GFR, Estimated: >60 mL/min (ref >60)
Glucose, Bld: 126 mg/dL — ABNORMAL HIGH (ref 70–99)
Potassium: 3.8 mmol/L (ref 3.5–5.1)
Sodium: 141 mmol/L (ref 135–145)
Total Bilirubin: 0.4 mg/dL (ref 0.3–1.2)
Total Protein: 6.9 g/dL (ref 6.5–8.1)

## 2020-11-08 LAB — MAGNESIUM: Magnesium: 1.7 mg/dL (ref 1.7–2.4)

## 2020-11-08 MED ORDER — SODIUM CHLORIDE 0.9% FLUSH
10.0000 mL | INTRAVENOUS | Status: DC | PRN
  Administered 2020-11-08: 10 mL

## 2020-11-08 MED ORDER — SODIUM CHLORIDE 0.9 % IV SOLN
60.0000 mg/m2 | Freq: Once | INTRAVENOUS | Status: AC
  Administered 2020-11-08: 130 mg via INTRAVENOUS
  Filled 2020-11-08: qty 13

## 2020-11-08 MED ORDER — SODIUM CHLORIDE 0.9 % IV SOLN: Freq: Once | INTRAVENOUS | Status: AC

## 2020-11-08 MED ORDER — FLUCONAZOLE 100 MG PO TABS
100.0000 mg | ORAL_TABLET | Freq: Every day | ORAL | 0 refills | Status: DC
  Filled 2020-11-08: qty 7, 7d supply, fill #0

## 2020-11-08 MED ORDER — SODIUM CHLORIDE 0.9 % IV SOLN
150.0000 mg | Freq: Once | INTRAVENOUS | Status: AC
  Administered 2020-11-08: 150 mg via INTRAVENOUS
  Filled 2020-11-08: qty 150

## 2020-11-08 MED ORDER — SODIUM CHLORIDE 0.9 % IV SOLN
75.0000 mg/m2 | Freq: Once | INTRAVENOUS | Status: AC
  Administered 2020-11-08: 159 mg via INTRAVENOUS
  Filled 2020-11-08: qty 159

## 2020-11-08 MED ORDER — PALONOSETRON HCL INJECTION 0.25 MG/5ML
0.2500 mg | Freq: Once | INTRAVENOUS | Status: AC
Start: 2020-11-08 — End: 2020-11-08
  Administered 2020-11-08: 0.25 mg via INTRAVENOUS
  Filled 2020-11-08: qty 5

## 2020-11-08 MED ORDER — MAGNESIUM SULFATE 2 GM/50ML IV SOLN
2.0000 g | Freq: Once | INTRAVENOUS | Status: AC
  Administered 2020-11-08: 2 g via INTRAVENOUS
  Filled 2020-11-08: qty 50

## 2020-11-08 MED ORDER — SODIUM CHLORIDE 0.9% FLUSH
10.0000 mL | Freq: Once | INTRAVENOUS | Status: AC
  Administered 2020-11-08: 10 mL

## 2020-11-08 MED ORDER — SODIUM CHLORIDE 0.9 % IV SOLN
10.0000 mg | Freq: Once | INTRAVENOUS | Status: AC
  Administered 2020-11-08: 10 mg via INTRAVENOUS
  Filled 2020-11-08: qty 10

## 2020-11-08 MED ORDER — HEPARIN SOD (PORK) LOCK FLUSH 100 UNIT/ML IV SOLN
500.0000 [IU] | Freq: Once | INTRAVENOUS | Status: AC | PRN
  Administered 2020-11-08: 500 [IU]

## 2020-11-08 MED ORDER — POTASSIUM CHLORIDE IN NACL 20-0.9 MEQ/L-% IV SOLN
Freq: Once | INTRAVENOUS | Status: AC
  Filled 2020-11-08: qty 1000

## 2020-11-08 NOTE — Research (Signed)
S1912CD Study: Collected the 3 months questionnaires from patient in infusion room. Reviewed for completeness and accuracy. Clarified with patient that some questionnaires were completed on Sept 9th and others on Sept 27th as dated. Thanked patient for his participation and bringing in the questionnaires. Informed him questionnaires will be requested in December and he verbalized understanding.  Called patient's spouse, Ria Comment, to clarify some of her answers on the Spouse Follow Up Financial Form. She answered "no" to the question #2 "are you and your spouse still living in the same household?" She states that is incorrect and the answer is "yes" they are still living in the same household. Question #4 she answered "no" to watching videos but checked "yes" to some of the videos. She states she did not watch any videos and the other videos should not have been checked "yes."  Ria Comment asks if research nurse will re-send email with the links to watch the study videos stating she cannot find the email. Informed her I will resend the email and to please call if she does not receive them or has any questions. Thanked her for her ongoing participation in this study. She verbalized understanding.  Foye Spurling, BSN, Therapist, sports, [employer] Clinical Research Nurse 11/08/2020

## 2020-11-08 NOTE — Patient Instructions (Signed)
[facility] ONCOLOGY  Discharge Instructions: Thank you for choosing [facility] to provide your oncology and hematology care.   If you have a lab appointment with the [facility], please go directly to the [facility] and check in at the registration area.   Wear comfortable clothing and clothing appropriate for easy access to any Portacath or PICC line.   We strive to give you quality time with your provider. You may need to reschedule your appointment if you arrive late (15 or more minutes).  Arriving late affects you and other patients whose appointments are after yours.  Also, if you miss three or more appointments without notifying the office, you may be dismissed from the clinic at the provider's discretion.      For prescription refill requests, have your pharmacy contact our office and allow 72 hours for refills to be completed.    Today you received the following chemotherapy and/or immunotherapy agents Taxotere and Cisplatin      To help prevent nausea and vomiting after your treatment, we encourage you to take your nausea medication as directed.  BELOW ARE SYMPTOMS THAT SHOULD BE REPORTED IMMEDIATELY: *FEVER GREATER THAN 100.4 F (38 C) OR HIGHER *CHILLS OR SWEATING *NAUSEA AND VOMITING THAT IS NOT CONTROLLED WITH YOUR NAUSEA MEDICATION *UNUSUAL SHORTNESS OF BREATH *UNUSUAL BRUISING OR BLEEDING *URINARY PROBLEMS (pain or burning when urinating, or frequent urination) *BOWEL PROBLEMS (unusual diarrhea, constipation, pain near the anus) TENDERNESS IN MOUTH AND THROAT WITH OR WITHOUT PRESENCE OF ULCERS (sore throat, sores in mouth, or a toothache) UNUSUAL RASH, SWELLING OR PAIN  UNUSUAL VAGINAL DISCHARGE OR ITCHING   Items with * indicate a potential emergency and should be followed up as soon as possible or go to the Emergency Department if any problems should occur.  Please show the CHEMOTHERAPY ALERT CARD or IMMUNOTHERAPY ALERT CARD at  check-in to the Emergency Department and triage nurse.  Should you have questions after your visit or need to cancel or reschedule your appointment, please contact [facility]  Dept: [phone]  and follow the prompts.  Office hours are 8:00 a.m. to 4:30 p.m. Monday - Friday. Please note that voicemails left after 4:00 p.m. may not be returned until the following business day.  We are closed weekends and major holidays. You have access to a nurse at all times for urgent questions. Please call the main number to the clinic Dept: [phone] and follow the prompts.   For any non-urgent questions, you may also contact your provider using MyChart. We now offer e-Visits for anyone 73 and older to request care online for non-urgent symptoms. For details visit [email or web address].   Also download the MyChart app! Go to the app store, search "MyChart", open the app, select [facility], and log in with your MyChart username and password.  Due to Covid, a mask is required upon entering the hospital/clinic. If you do not have a mask, one will be given to you upon arrival. For doctor visits, patients may have 1 support person aged 12 or older with them. For treatment visits, patients cannot have anyone with them due to current Covid guidelines and our immunocompromised population.

## 2020-11-09 ENCOUNTER — Other Ambulatory Visit: Payer: Self-pay | Admitting: Hematology and Oncology

## 2020-11-09 ENCOUNTER — Telehealth: Payer: Self-pay

## 2020-11-09 ENCOUNTER — Other Ambulatory Visit (HOSPITAL_COMMUNITY): Payer: Self-pay

## 2020-11-09 MED ORDER — MORPHINE SULFATE 30 MG PO TABS
30.0000 mg | ORAL_TABLET | ORAL | 0 refills | Status: DC | PRN
  Filled 2020-11-09: qty 90, 30d supply, fill #0

## 2020-11-09 MED ORDER — MORPHINE SULFATE ER 60 MG PO TBCR
60.0000 mg | EXTENDED_RELEASE_TABLET | Freq: Three times a day (TID) | ORAL | 0 refills | Status: DC
  Filled 2020-11-09: qty 90, 30d supply, fill #0

## 2020-11-09 NOTE — Telephone Encounter (Signed)
Called wife. He needs both pain medication refills and send to [facility] outpatient pharmacy.

## 2020-11-09 NOTE — Telephone Encounter (Signed)
done

## 2020-11-10 ENCOUNTER — Other Ambulatory Visit: Payer: Self-pay

## 2020-11-10 ENCOUNTER — Inpatient Hospital Stay: Payer: BC Managed Care – PPO

## 2020-11-10 ENCOUNTER — Other Ambulatory Visit: Payer: Self-pay | Admitting: Hematology and Oncology

## 2020-11-10 ENCOUNTER — Ambulatory Visit: Payer: BC Managed Care – PPO | Admitting: Dietician

## 2020-11-10 ENCOUNTER — Other Ambulatory Visit (HOSPITAL_COMMUNITY): Payer: Self-pay

## 2020-11-10 VITALS — BP 109/77 | HR 77 | Temp 97.9°F | Resp 17 | Wt 185.5 lb

## 2020-11-10 DIAGNOSIS — C7802 Secondary malignant neoplasm of left lung: Secondary | ICD-10-CM

## 2020-11-10 DIAGNOSIS — C7801 Secondary malignant neoplasm of right lung: Secondary | ICD-10-CM

## 2020-11-10 DIAGNOSIS — Z5112 Encounter for antineoplastic immunotherapy: Secondary | ICD-10-CM | POA: Diagnosis not present

## 2020-11-10 DIAGNOSIS — C029 Malignant neoplasm of tongue, unspecified: Secondary | ICD-10-CM

## 2020-11-10 LAB — CBC WITH DIFFERENTIAL (CANCER CENTER ONLY)
Abs Immature Granulocytes: 0.06 10*3/uL (ref 0.00–0.07)
Basophils Absolute: 0 10*3/uL (ref 0.0–0.1)
Basophils Relative: 0 %
Eosinophils Absolute: 0 10*3/uL (ref 0.0–0.5)
Eosinophils Relative: 0 %
HCT: 31.8 % — ABNORMAL LOW (ref 39.0–52.0)
Hemoglobin: 10.1 g/dL — ABNORMAL LOW (ref 13.0–17.0)
Immature Granulocytes: 1 %
Lymphocytes Relative: 2 %
Lymphs Abs: 0.2 10*3/uL — ABNORMAL LOW (ref 0.7–4.0)
MCH: 28.4 pg (ref 26.0–34.0)
MCHC: 31.8 g/dL (ref 30.0–36.0)
MCV: 89.3 fL (ref 80.0–100.0)
Monocytes Absolute: 0.2 10*3/uL (ref 0.1–1.0)
Monocytes Relative: 2 %
Neutro Abs: 10 10*3/uL — ABNORMAL HIGH (ref 1.7–7.7)
Neutrophils Relative %: 95 %
Platelet Count: 397 10*3/uL (ref 150–400)
RBC: 3.56 MIL/uL — ABNORMAL LOW (ref 4.22–5.81)
RDW: 14.6 % (ref 11.5–15.5)
WBC Count: 10.5 10*3/uL (ref 4.0–10.5)
nRBC: 0 % (ref 0.0–0.2)

## 2020-11-10 LAB — CMP (CANCER CENTER ONLY)
ALT: 22 U/L (ref 0–44)
AST: 27 U/L (ref 15–41)
Albumin: 3.1 g/dL — ABNORMAL LOW (ref 3.5–5.0)
Alkaline Phosphatase: 92 U/L (ref 38–126)
Anion gap: 8 (ref 5–15)
BUN: 18 mg/dL (ref 6–20)
CO2: 26 mmol/L (ref 22–32)
Calcium: 8.5 mg/dL — ABNORMAL LOW (ref 8.9–10.3)
Chloride: 99 mmol/L (ref 98–111)
Creatinine: 0.68 mg/dL (ref 0.61–1.24)
GFR, Estimated: >60 mL/min (ref >60)
Glucose, Bld: 118 mg/dL — ABNORMAL HIGH (ref 70–99)
Potassium: 3.7 mmol/L (ref 3.5–5.1)
Sodium: 133 mmol/L — ABNORMAL LOW (ref 135–145)
Total Bilirubin: 0.6 mg/dL (ref 0.3–1.2)
Total Protein: 7 g/dL (ref 6.5–8.1)

## 2020-11-10 LAB — MAGNESIUM: Magnesium: 2 mg/dL (ref 1.7–2.4)

## 2020-11-10 MED ORDER — HEPARIN SOD (PORK) LOCK FLUSH 100 UNIT/ML IV SOLN
500.0000 [IU] | Freq: Once | INTRAVENOUS | Status: AC | PRN
  Administered 2020-11-10: 500 [IU]

## 2020-11-10 MED ORDER — DIPHENHYDRAMINE HCL 50 MG/ML IJ SOLN
25.0000 mg | Freq: Once | INTRAMUSCULAR | Status: AC
  Administered 2020-11-10: 25 mg via INTRAVENOUS
  Filled 2020-11-10: qty 1

## 2020-11-10 MED ORDER — PEGFILGRASTIM-JMDB 6 MG/0.6ML ~~LOC~~ SOSY
6.0000 mg | PREFILLED_SYRINGE | Freq: Once | SUBCUTANEOUS | Status: AC
  Administered 2020-11-10: 6 mg via SUBCUTANEOUS
  Filled 2020-11-10: qty 0.6

## 2020-11-10 MED ORDER — SODIUM CHLORIDE 0.9 % IV SOLN: Freq: Once | INTRAVENOUS | Status: AC

## 2020-11-10 MED ORDER — METHYLPREDNISOLONE SODIUM SUCC 125 MG IJ SOLR
125.0000 mg | Freq: Once | INTRAMUSCULAR | Status: AC
Start: 2020-11-10 — End: 2020-11-10
  Administered 2020-11-10: 125 mg via INTRAVENOUS
  Filled 2020-11-10: qty 2

## 2020-11-10 MED ORDER — FAMOTIDINE 20 MG IN NS 100 ML IVPB
20.0000 mg | Freq: Once | INTRAVENOUS | Status: AC
  Administered 2020-11-10: 20 mg via INTRAVENOUS
  Filled 2020-11-10: qty 100

## 2020-11-10 MED ORDER — SODIUM CHLORIDE 0.9% FLUSH
10.0000 mL | Freq: Once | INTRAVENOUS | Status: AC
  Administered 2020-11-10: 10 mL

## 2020-11-10 MED ORDER — SODIUM CHLORIDE 0.9% FLUSH
10.0000 mL | INTRAVENOUS | Status: DC | PRN
  Administered 2020-11-10: 10 mL

## 2020-11-10 MED ORDER — CETUXIMAB CHEMO IV INJECTION 200 MG/100ML
150.0000 mg/m2 | Freq: Once | INTRAVENOUS | Status: AC
  Administered 2020-11-10: 300 mg via INTRAVENOUS
  Filled 2020-11-10: qty 100

## 2020-11-10 NOTE — Progress Notes (Signed)
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 Patient is s/p partial glossectomy in 2013, completed concurrent chemoradiation 2014. He is currently receiving cisplatin and docetaxel.   Met with patient during infusion. He reports prior feeding tube. Patient does not want another tube. Patient reports he has had multiple extractions, says he is tolerating smooth textures without difficulty. Patient reports drinking 3-4 Boost VHC, soups, bone broth, water, gatorade. Patient reports early satiety, occasional nausea with Boost VHC due to thickness. He cuts supplement with milk which helps. Patient reports mucositis is better. Patient is using magic mouthwash and this has been working well.    Nutrition Focused Physical Exam: unable to complete, patient wearing jeans, zipped hooded sweatshirt with hood pulled up at time of visit. Observed severe orbital fat depletion.    Medications: Diflucan, magic mouthwash, MSIR, miralax, MS Contin, Prednisone, Compazine, Senokot   Labs: Na 133, Glucose 118   Anthropometrics: Weights have decreased 78 lbs (29%) from usual weight in 5 months. This is severe.   Height: 6'2" Weight: 189 lb (10/3) UBW: 267 lb 9.6 oz (5/3) BMI: 23.82   Estimated Energy Needs  Kcals: [phone] Protein: 135-151 Fluid: 2.5 L   NUTRITION DIAGNOSIS: Severe malnutrition related to chronic illness as evidenced by dietary recall meeting </= 75% of estimated needs and severe 29% (78 lb) weight loss in 5 months.    INTERVENTION:  Continue drinking Boost VHC (530 kcal, 22 grams protein), recommend drinking 4/day Suggested patient drink 2 Ensure Enlive/equivalent for added calories and protein to promote weight gain (350 kcal, 20 g protein) Complimentary case of Ensure Enlive provided today Continue strategies for increasing calories and protein to soft smooth foods (using whole milk in soups,  adding cheese, using bone broth to alter textures) - handout with easy to chew/swallow foods as well as tips to boost calories/protein, shake recipes provided Continue magic mouthwash as needed Suggested baking soda, salt water rinses several times/day - handout with recipe provided Contact information provided    MONITORING, EVALUATION, GOAL: Patient will tolerate increased calories and protein to promote weight gain   Next Visit: Friday November 4 during infusion

## 2020-11-10 NOTE — Patient Instructions (Signed)
[facility] ONCOLOGY  Discharge Instructions: Thank you for choosing [facility] to provide your oncology and hematology care.   If you have a lab appointment with the [facility], please go directly to the [facility] and check in at the registration area.   Wear comfortable clothing and clothing appropriate for easy access to any Portacath or PICC line.   We strive to give you quality time with your provider. You may need to reschedule your appointment if you arrive late (15 or more minutes).  Arriving late affects you and other patients whose appointments are after yours.  Also, if you miss three or more appointments without notifying the office, you may be dismissed from the clinic at the provider's discretion.      For prescription refill requests, have your pharmacy contact our office and allow 72 hours for refills to be completed.    Today you received the following chemotherapy and/or immunotherapy agents: Erbitux.      To help prevent nausea and vomiting after your treatment, we encourage you to take your nausea medication as directed.  BELOW ARE SYMPTOMS THAT SHOULD BE REPORTED IMMEDIATELY: *FEVER GREATER THAN 100.4 F (38 C) OR HIGHER *CHILLS OR SWEATING *NAUSEA AND VOMITING THAT IS NOT CONTROLLED WITH YOUR NAUSEA MEDICATION *UNUSUAL SHORTNESS OF BREATH *UNUSUAL BRUISING OR BLEEDING *URINARY PROBLEMS (pain or burning when urinating, or frequent urination) *BOWEL PROBLEMS (unusual diarrhea, constipation, pain near the anus) TENDERNESS IN MOUTH AND THROAT WITH OR WITHOUT PRESENCE OF ULCERS (sore throat, sores in mouth, or a toothache) UNUSUAL RASH, SWELLING OR PAIN  UNUSUAL VAGINAL DISCHARGE OR ITCHING   Items with * indicate a potential emergency and should be followed up as soon as possible or go to the Emergency Department if any problems should occur.  Please show the CHEMOTHERAPY ALERT CARD or IMMUNOTHERAPY ALERT CARD at check-in to  the Emergency Department and triage nurse.  Should you have questions after your visit or need to cancel or reschedule your appointment, please contact [facility]  Dept: [phone]  and follow the prompts.  Office hours are 8:00 a.m. to 4:30 p.m. Monday - Friday. Please note that voicemails left after 4:00 p.m. may not be returned until the following business day.  We are closed weekends and major holidays. You have access to a nurse at all times for urgent questions. Please call the main number to the clinic Dept: [phone] and follow the prompts.   For any non-urgent questions, you may also contact your provider using MyChart. We now offer e-Visits for anyone 93 and older to request care online for non-urgent symptoms. For details visit [email or web address].   Also download the MyChart app! Go to the app store, search "MyChart", open the app, select [facility], and log in with your MyChart username and password.  Due to Covid, a mask is required upon entering the hospital/clinic. If you do not have a mask, one will be given to you upon arrival. For doctor visits, patients may have 1 support person aged 5 or older with them. For treatment visits, patients cannot have anyone with them due to current Covid guidelines and our immunocompromised population.

## 2020-11-16 ENCOUNTER — Other Ambulatory Visit (HOSPITAL_COMMUNITY): Payer: Self-pay

## 2020-11-17 ENCOUNTER — Inpatient Hospital Stay: Payer: BC Managed Care – PPO

## 2020-11-17 ENCOUNTER — Encounter: Payer: Self-pay | Admitting: Hematology and Oncology

## 2020-11-17 ENCOUNTER — Ambulatory Visit: Payer: BC Managed Care – PPO | Admitting: Hematology and Oncology

## 2020-11-17 ENCOUNTER — Inpatient Hospital Stay (HOSPITAL_BASED_OUTPATIENT_CLINIC_OR_DEPARTMENT_OTHER): Payer: BC Managed Care – PPO | Admitting: Hematology and Oncology

## 2020-11-17 ENCOUNTER — Other Ambulatory Visit: Payer: Self-pay

## 2020-11-17 ENCOUNTER — Other Ambulatory Visit (HOSPITAL_COMMUNITY): Payer: Self-pay

## 2020-11-17 VITALS — BP 134/80 | HR 113 | Temp 97.4°F | Resp 18 | Wt 176.8 lb

## 2020-11-17 DIAGNOSIS — K1231 Oral mucositis (ulcerative) due to antineoplastic therapy: Secondary | ICD-10-CM

## 2020-11-17 DIAGNOSIS — C7802 Secondary malignant neoplasm of left lung: Secondary | ICD-10-CM

## 2020-11-17 DIAGNOSIS — G893 Neoplasm related pain (acute) (chronic): Secondary | ICD-10-CM

## 2020-11-17 DIAGNOSIS — R634 Abnormal weight loss: Secondary | ICD-10-CM

## 2020-11-17 DIAGNOSIS — C029 Malignant neoplasm of tongue, unspecified: Secondary | ICD-10-CM | POA: Diagnosis not present

## 2020-11-17 DIAGNOSIS — Z5112 Encounter for antineoplastic immunotherapy: Secondary | ICD-10-CM | POA: Diagnosis not present

## 2020-11-17 LAB — CMP (CANCER CENTER ONLY)
ALT: 55 U/L — ABNORMAL HIGH (ref 0–44)
AST: 22 U/L (ref 15–41)
Albumin: 3.5 g/dL (ref 3.5–5.0)
Alkaline Phosphatase: 140 U/L — ABNORMAL HIGH (ref 38–126)
Anion gap: 11 (ref 5–15)
BUN: 10 mg/dL (ref 6–20)
CO2: 26 mmol/L (ref 22–32)
Calcium: 9.2 mg/dL (ref 8.9–10.3)
Chloride: 97 mmol/L — ABNORMAL LOW (ref 98–111)
Creatinine: 0.67 mg/dL (ref 0.61–1.24)
GFR, Estimated: >60 mL/min (ref >60)
Glucose, Bld: 118 mg/dL — ABNORMAL HIGH (ref 70–99)
Potassium: 3.6 mmol/L (ref 3.5–5.1)
Sodium: 134 mmol/L — ABNORMAL LOW (ref 135–145)
Total Bilirubin: 0.4 mg/dL (ref 0.3–1.2)
Total Protein: 7.5 g/dL (ref 6.5–8.1)

## 2020-11-17 LAB — CBC WITH DIFFERENTIAL (CANCER CENTER ONLY)
Abs Immature Granulocytes: 0.12 10*3/uL — ABNORMAL HIGH (ref 0.00–0.07)
Basophils Absolute: 0 10*3/uL (ref 0.0–0.1)
Basophils Relative: 0 %
Eosinophils Absolute: 0 10*3/uL (ref 0.0–0.5)
Eosinophils Relative: 0 %
HCT: 33 % — ABNORMAL LOW (ref 39.0–52.0)
Hemoglobin: 11.2 g/dL — ABNORMAL LOW (ref 13.0–17.0)
Immature Granulocytes: 2 %
Lymphocytes Relative: 5 %
Lymphs Abs: 0.3 10*3/uL — ABNORMAL LOW (ref 0.7–4.0)
MCH: 28.9 pg (ref 26.0–34.0)
MCHC: 33.9 g/dL (ref 30.0–36.0)
MCV: 85.1 fL (ref 80.0–100.0)
Monocytes Absolute: 0.8 10*3/uL (ref 0.1–1.0)
Monocytes Relative: 12 %
Neutro Abs: 4.9 10*3/uL (ref 1.7–7.7)
Neutrophils Relative %: 81 %
Platelet Count: 296 10*3/uL (ref 150–400)
RBC: 3.88 MIL/uL — ABNORMAL LOW (ref 4.22–5.81)
RDW: 14.2 % (ref 11.5–15.5)
Smear Review: NORMAL
WBC Count: 6.1 10*3/uL (ref 4.0–10.5)
nRBC: 0 % (ref 0.0–0.2)

## 2020-11-17 LAB — MAGNESIUM: Magnesium: 1.9 mg/dL (ref 1.7–2.4)

## 2020-11-17 MED ORDER — CLINDAMYCIN HCL 300 MG PO CAPS
300.0000 mg | ORAL_CAPSULE | Freq: Three times a day (TID) | ORAL | 0 refills | Status: DC
  Filled 2020-11-17: qty 21, 7d supply, fill #0

## 2020-11-17 MED ORDER — NYSTATIN 100000 UNIT/ML MT SUSP
5.0000 mL | Freq: Four times a day (QID) | OROMUCOSAL | 1 refills | Status: DC | PRN
  Filled 2020-11-17 – 2020-11-18 (×2): qty 240, fill #0
  Filled 2020-11-24: qty 240, 12d supply, fill #0

## 2020-11-17 MED ORDER — SODIUM CHLORIDE 0.9 % IV SOLN: Freq: Once | INTRAVENOUS | Status: AC

## 2020-11-17 MED ORDER — SODIUM CHLORIDE 0.9% FLUSH
10.0000 mL | Freq: Once | INTRAVENOUS | Status: AC
  Administered 2020-11-17: 10 mL

## 2020-11-17 NOTE — Patient Instructions (Signed)

## 2020-11-17 NOTE — Assessment & Plan Note (Signed)
His pain is stable but he has signs of mucositis and thrush We will renew his prescription of Magic mouthwash with lidocaine

## 2020-11-17 NOTE — Assessment & Plan Note (Signed)
Clinically, it appears that he had abscess draining inside his mouth causing sensation of bad taste and weight loss I recommend we hold treatment today and focus on supportive care We will proceed to give him a course of clindamycin antibiotics I given specific written instructions to stop doxycycline in the meantime and will reevaluate next week

## 2020-11-17 NOTE — Progress Notes (Signed)
[facility] OFFICE PROGRESS NOTE  Patient Care Team: Redmond School, MD as PCP - General (Internal Medicine) [name], MD as Attending Physician (Otolaryngology) Gery Pray, MD as Attending Physician (Radiation Oncology) Heath Lark, MD as Consulting Physician (Hematology and Oncology) Daneil Dolin, MD as Consulting Physician (Gastroenterology)  ASSESSMENT & PLAN:  Tongue cancer Clinically, it appears that he had abscess draining inside his mouth causing sensation of bad taste and weight loss I recommend we hold treatment today and focus on supportive care We will proceed to give him a course of clindamycin antibiotics I given specific written instructions to stop doxycycline in the meantime and will reevaluate next week  Cancer associated pain His pain is stable but he has signs of mucositis and thrush We will renew his prescription of Magic mouthwash with lidocaine  Weight loss, abnormal He has significant unintentional weight loss due to difficulties with eating We discussed strategies to improve his oral intake I am hopeful that the new antibiotics might help with the oral abscess  Mucositis due to antineoplastic therapy As above, I will put cetuximab on hold We will focus on supportive care today He will receive a liter of IV fluid  No orders of the defined types were placed in this encounter.   All questions were answered. The patient knows to call the clinic with any problems, questions or concerns. The total time spent in the appointment was 30 minutes encounter with patients including review of chart and various tests results, discussions about plan of care and coordination of care plan   Heath Lark, MD 11/17/2020 1:00 PM  INTERVAL HISTORY: Please see below for problem oriented charting. he returns for treatment follow-up urgently, seen in the infusion room due to feeling unwell The patient has lost a lot of weight over the course of the  last few days due to necrotic discharge inside his mouth causing bad taste and difficulties with swallowing He also noted some increased secretion inside his mouth His pain is stable He has some mild nausea He is attempting to eat several Ensure per day  REVIEW OF SYSTEMS:   Constitutional: Denies fevers, chills  Eyes: Denies blurriness of vision Respiratory: Denies cough, dyspnea or wheezes Cardiovascular: Denies palpitation, chest discomfort or lower extremity swelling Gastrointestinal:  Denies nausea, heartburn or change in bowel habits Skin: Denies abnormal skin rashes Lymphatics: Denies new lymphadenopathy or easy bruising Neurological:Denies numbness, tingling or new weaknesses Behavioral/Psych: Mood is stable, no new changes  All other systems were reviewed with the patient and are negative.  I have reviewed the past medical history, past surgical history, social history and family history with the patient and they are unchanged from previous note.  ALLERGIES:  is allergic to cetuximab.  MEDICATIONS:  Current Outpatient Medications  Medication Sig Dispense Refill   clindamycin (CLEOCIN) 300 MG capsule Take 1 capsule by mouth 3 (three) times daily. 21 capsule 0   fluconazole (DIFLUCAN) 100 MG tablet Take 1 tablet (100 mg total) by mouth daily. 7 tablet 0   loratadine (CLARITIN) 10 MG tablet Take 10 mg by mouth daily as needed for allergies.     magic mouthwash (nystatin, lidocaine, diphenhydrAMINE, alum & mag hydroxide) suspension Swish and spit 5 mls 4 times a day as needed 240 mL 1   morphine (MS CONTIN) 60 MG 12 hr tablet Take 1 tablet (60 mg total) by mouth in the morning, at noon, and at bedtime. 90 tablet 0   morphine (MSIR) 30 MG  tablet Take 1 tablet (30 mg total) by mouth every 4 (four) hours as needed for severe pain. 90 tablet 0   polyethylene glycol (MIRALAX / GLYCOLAX) 17 g packet Take 17 g by mouth daily.     predniSONE (DELTASONE) 5 MG tablet Take 1 tablet (5 mg  total) by mouth daily with breakfast. 30 tablet 1   prochlorperazine (COMPAZINE) 10 MG tablet Take 1 tablet (10 mg total) by mouth every 6 (six) hours as needed for nausea or vomiting. 30 tablet 3   senna (SENOKOT) 8.6 MG tablet Take 2 tablets by mouth 2 (two) times daily.     No current facility-administered medications for this visit.    SUMMARY OF ONCOLOGIC HISTORY: Oncology History  Tongue cancer ([place])  08/22/2011 Surgery   He had partial glossectomy which showed invasive SCC measured 1.3 cm   09/06/2011 Surgery   He underwent right neck dissection which showed 1/24 LN involved and repeat resection of tongue was negative   03/23/2012 Surgery   Repeat tngue resection showed well differentiated SCC 1.5 cm which invaded into the muscle   04/17/2012 Imaging   PET/CT scan showed New adenopathy in the left neck, compatible with recurrent malignancy   04/23/2012 Surgery   he underwent left neck LN dissection and 2/34 LN were positve   06/01/2012 - 07/13/2012 Chemotherapy   Patient had 3 cycles of cisplatin   06/12/2012 - 07/14/2012 Radiation Therapy   Patient completed RT   12/10/2012 Imaging   Interval resolution of previous hypermetabolic cervical adenopathy. There is nonspecific asymmetric increased uptake within the right side of tongue   03/13/2013 Imaging   PET/CT scan showed no evidence of disease recurrence. He was noted to have incidental kidney stones   08/15/2014 Imaging   CT scan of the dissection no evidence of disease   05/02/2020 Imaging   CT scan of neck 1. 4.3 x 2.7 x 2.7 cm peripherally enhancing mass at the anterior tongue base with focal sclerosis of the anterior genu of the mandible. 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 2. 8 mm peripherally enhancing left level 2 lymph node is concerning for metastatic disease. Recommend PET scan of the neck to further characterize disease. 3. No other significant recurrent  left-sided lymph nodes. 4. Post radiation changes of the carotid sheath bilaterally. 5. Post radiation changes at the lung apices bilaterally. 6. Polyps or mucous retention cysts within the maxillary sinuses bilaterally.   05/24/2020 Pathology Results   A. SUBMENTAL MASS, MIDLINE, NEEDLE CORE BIOPSY:  - Poorly differentiated sarcomatoid malignancy.   COMMENT:   CK7 is positive.  CKAE1/AE3, CK8/18, CK20, p40, CK5/6, S-100, Melan-A and Desmin are negative.  SMA demonstrates nonspecific staining.  Ki-67 proliferation index is high.  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     05/24/2020 Procedure   Successful ultrasound submental mass 18 gauge core biopsy   06/07/2020 PET scan   IMPRESSION: 1. Unfortunately evidence of squamous cell carcinoma recurrence in the floor of the mouth. Broad lesion with intense peripheral metabolic activity in the anterior RIGHT floor the mouth. 2. Bilateral hypermetabolic nodules within the sub submandibular which are new from PET-CT 2015. Concern for unusual metastasis to the submandibular glands. 3. New small bilateral small pulmonary nodules with faint radiotracer activity. These are also concerning for metastasis. 4. Asymmetric hypermetabolic activity within the RIGHT testicle. Favor benign inflammation; however consider testicular ultrasound if concern for unusual pattern  of metastasis.   06/15/2020 Procedure   Successful placement of a right internal jugular approach power injectable Port-A-Cath. The catheter is ready for immediate use.     06/19/2020 - 08/05/2020 Chemotherapy          08/21/2020 PET scan   1. Progressive tumor in the floor the mouth with overt permeative destructive changes involving the mandible. 2. Persistent hypermetabolic neck nodes as detailed above. No new findings. 3. Progressive pulmonary metastatic disease. 4. No findings for abdominal/pelvic metastatic disease. 5.  Persistent hypermetabolism in the right hemiscrotum most likely due to a varicocele.     08/29/2020 -  Chemotherapy   Patient is on Treatment Plan : HEAD/NECK Cisplatin + Docetaxel q21d plus cetuximab      08/31/2020 -  Chemotherapy   Patient is on Treatment Plan : HEAD/NECK Cetuximab q7d     10/27/2020 Imaging   1. Allowing for differing technique, no change in bilateral solid pulmonary nodules. 2. Several ground-glass nodules in the RIGHT upper lobe and mild tree-in-bud pattern in the RIGHT lower lobe could indicate mild pulmonary infection. 3. No lymphadenopathy.     10/27/2020 Imaging   Considerable enlargement of tumor at the anterior tongue and floor of the mouth measuring approximately 6 x 5 x 3 cm, with areas of intratumoral necrosis, particularly anterior.   Considerable progression of lytic destructive involvement of the mandible, more extensive on the right side than the left.   Enlargement of a low-density malignant level 2 node on the left, 12 mm today compared with 7 mm in April.   Metastasis to lung ([facility])  06/09/2020 Initial Diagnosis   Metastasis to lung ([place])   08/31/2020 -  Chemotherapy   Patient is on Treatment Plan : HEAD/NECK Cetuximab q7d       PHYSICAL EXAMINATION: ECOG PERFORMANCE STATUS: 1 - Symptomatic but completely ambulatory GENERAL:alert, no distress and comfortable SKIN: skin color, texture, turgor are normal, no rashes or significant lesions EYES: normal, Conjunctiva are pink and non-injected, sclera clear OROPHARYNX: Noted facial deformity due to cancer.  The floor of his mouth is erythematous and necrotic tumor/discharge is noted within the oropharynx LYMPH:  no palpable lymphadenopathy in the cervical, axillary or inguinal LUNGS: clear to auscultation and percussion with normal breathing effort HEART: regular rate & rhythm and no murmurs and no lower extremity edema ABDOMEN:abdomen soft, non-tender and normal bowel sounds Musculoskeletal:no cyanosis  of digits and no clubbing  NEURO: alert & oriented x 3 with fluent speech, no focal motor/sensory deficits  LABORATORY DATA:  I have reviewed the data as listed    Component Value Date/Time   NA 134 (L) 11/17/2020 0829   NA 141 04/29/2016 1402   K 3.6 11/17/2020 0829   K 4.1 04/29/2016 1402   CL 97 (L) 11/17/2020 0829   CL 100 07/10/2012 1341   CO2 26 11/17/2020 0829   CO2 25 04/29/2016 1402   GLUCOSE 118 (H) 11/17/2020 0829   GLUCOSE 91 04/29/2016 1402   GLUCOSE 118 (H) 07/10/2012 1341   BUN 10 11/17/2020 0829   BUN 9.7 04/29/2016 1402   CREATININE 0.67 11/17/2020 0829   CREATININE 1.0 04/29/2016 1402   CALCIUM 9.2 11/17/2020 0829   CALCIUM 9.3 04/29/2016 1402   PROT 7.5 11/17/2020 0829   PROT 7.0 04/29/2016 1402   ALBUMIN 3.5 11/17/2020 0829   ALBUMIN 4.1 04/29/2016 1402   AST 22 11/17/2020 0829   AST 28 04/29/2016 1402   ALT 55 (H) 11/17/2020 0829   ALT 26  04/29/2016 1402   ALKPHOS 140 (H) 11/17/2020 0829   ALKPHOS 71 04/29/2016 1402   BILITOT 0.4 11/17/2020 0829   BILITOT 0.43 04/29/2016 1402   GFRNONAA >60 11/17/2020 0829   GFRAA >60 05/02/2017 0901    No results found for: SPEP, UPEP  Lab Results  Component Value Date   WBC 6.1 11/17/2020   NEUTROABS 4.9 11/17/2020   HGB 11.2 (L) 11/17/2020   HCT 33.0 (L) 11/17/2020   MCV 85.1 11/17/2020   PLT 296 11/17/2020      Chemistry      Component Value Date/Time   NA 134 (L) 11/17/2020 0829   NA 141 04/29/2016 1402   K 3.6 11/17/2020 0829   K 4.1 04/29/2016 1402   CL 97 (L) 11/17/2020 0829   CL 100 07/10/2012 1341   CO2 26 11/17/2020 0829   CO2 25 04/29/2016 1402   BUN 10 11/17/2020 0829   BUN 9.7 04/29/2016 1402   CREATININE 0.67 11/17/2020 0829   CREATININE 1.0 04/29/2016 1402      Component Value Date/Time   CALCIUM 9.2 11/17/2020 0829   CALCIUM 9.3 04/29/2016 1402   ALKPHOS 140 (H) 11/17/2020 0829   ALKPHOS 71 04/29/2016 1402   AST 22 11/17/2020 0829   AST 28 04/29/2016 1402   ALT 55 (H)  11/17/2020 0829   ALT 26 04/29/2016 1402   BILITOT 0.4 11/17/2020 0829   BILITOT 0.43 04/29/2016 1402

## 2020-11-17 NOTE — Assessment & Plan Note (Signed)
As above, I will put cetuximab on hold We will focus on supportive care today He will receive a liter of IV fluid

## 2020-11-17 NOTE — Assessment & Plan Note (Signed)
He has significant unintentional weight loss due to difficulties with eating We discussed strategies to improve his oral intake I am hopeful that the new antibiotics might help with the oral abscess

## 2020-11-17 NOTE — Progress Notes (Signed)
Per Dr. Alvy Bimler, skip chemo today and run 1L over 1 hour.

## 2020-11-18 ENCOUNTER — Other Ambulatory Visit (HOSPITAL_COMMUNITY): Payer: Self-pay

## 2020-11-21 ENCOUNTER — Ambulatory Visit: Payer: BC Managed Care – PPO | Admitting: Hematology and Oncology

## 2020-11-24 ENCOUNTER — Inpatient Hospital Stay (HOSPITAL_BASED_OUTPATIENT_CLINIC_OR_DEPARTMENT_OTHER): Payer: BC Managed Care – PPO | Admitting: Hematology and Oncology

## 2020-11-24 ENCOUNTER — Inpatient Hospital Stay: Payer: BC Managed Care – PPO

## 2020-11-24 ENCOUNTER — Other Ambulatory Visit (HOSPITAL_COMMUNITY): Payer: Self-pay

## 2020-11-24 ENCOUNTER — Encounter: Payer: Self-pay | Admitting: Hematology and Oncology

## 2020-11-24 ENCOUNTER — Other Ambulatory Visit: Payer: Self-pay

## 2020-11-24 VITALS — BP 108/71 | HR 97 | Temp 98.0°F | Resp 17

## 2020-11-24 DIAGNOSIS — C7801 Secondary malignant neoplasm of right lung: Secondary | ICD-10-CM

## 2020-11-24 DIAGNOSIS — K1231 Oral mucositis (ulcerative) due to antineoplastic therapy: Secondary | ICD-10-CM | POA: Diagnosis not present

## 2020-11-24 DIAGNOSIS — C029 Malignant neoplasm of tongue, unspecified: Secondary | ICD-10-CM

## 2020-11-24 DIAGNOSIS — Z5112 Encounter for antineoplastic immunotherapy: Secondary | ICD-10-CM | POA: Diagnosis not present

## 2020-11-24 DIAGNOSIS — G893 Neoplasm related pain (acute) (chronic): Secondary | ICD-10-CM | POA: Diagnosis not present

## 2020-11-24 DIAGNOSIS — C7802 Secondary malignant neoplasm of left lung: Secondary | ICD-10-CM

## 2020-11-24 LAB — CBC WITH DIFFERENTIAL (CANCER CENTER ONLY)
Abs Immature Granulocytes: 0.12 10*3/uL — ABNORMAL HIGH (ref 0.00–0.07)
Basophils Absolute: 0.1 10*3/uL (ref 0.0–0.1)
Basophils Relative: 1 %
Eosinophils Absolute: 0 10*3/uL (ref 0.0–0.5)
Eosinophils Relative: 0 %
HCT: 30.2 % — ABNORMAL LOW (ref 39.0–52.0)
Hemoglobin: 9.6 g/dL — ABNORMAL LOW (ref 13.0–17.0)
Immature Granulocytes: 1 %
Lymphocytes Relative: 3 %
Lymphs Abs: 0.3 10*3/uL — ABNORMAL LOW (ref 0.7–4.0)
MCH: 28.2 pg (ref 26.0–34.0)
MCHC: 31.8 g/dL (ref 30.0–36.0)
MCV: 88.6 fL (ref 80.0–100.0)
Monocytes Absolute: 0.6 10*3/uL (ref 0.1–1.0)
Monocytes Relative: 7 %
Neutro Abs: 7.7 10*3/uL (ref 1.7–7.7)
Neutrophils Relative %: 88 %
Platelet Count: 311 10*3/uL (ref 150–400)
RBC: 3.41 MIL/uL — ABNORMAL LOW (ref 4.22–5.81)
RDW: 14.7 % (ref 11.5–15.5)
WBC Count: 8.8 10*3/uL (ref 4.0–10.5)
nRBC: 0 % (ref 0.0–0.2)

## 2020-11-24 LAB — CMP (CANCER CENTER ONLY)
ALT: 14 U/L (ref 0–44)
AST: 12 U/L — ABNORMAL LOW (ref 15–41)
Albumin: 3.1 g/dL — ABNORMAL LOW (ref 3.5–5.0)
Alkaline Phosphatase: 100 U/L (ref 38–126)
Anion gap: 10 (ref 5–15)
BUN: 9 mg/dL (ref 6–20)
CO2: 27 mmol/L (ref 22–32)
Calcium: 9.5 mg/dL (ref 8.9–10.3)
Chloride: 102 mmol/L (ref 98–111)
Creatinine: 0.64 mg/dL (ref 0.61–1.24)
GFR, Estimated: >60 mL/min (ref >60)
Glucose, Bld: 116 mg/dL — ABNORMAL HIGH (ref 70–99)
Potassium: 4 mmol/L (ref 3.5–5.1)
Sodium: 139 mmol/L (ref 135–145)
Total Bilirubin: 0.3 mg/dL (ref 0.3–1.2)
Total Protein: 6.8 g/dL (ref 6.5–8.1)

## 2020-11-24 LAB — MAGNESIUM: Magnesium: 1.7 mg/dL (ref 1.7–2.4)

## 2020-11-24 MED ORDER — METHYLPREDNISOLONE SODIUM SUCC 125 MG IJ SOLR
125.0000 mg | Freq: Once | INTRAMUSCULAR | Status: AC
  Administered 2020-11-24: 125 mg via INTRAVENOUS
  Filled 2020-11-24: qty 2

## 2020-11-24 MED ORDER — EMPTY CONTAINERS FLEXIBLE MISC
150.0000 mg/m2 | Freq: Once | Status: AC
  Administered 2020-11-24: 300 mg via INTRAVENOUS
  Filled 2020-11-24: qty 50

## 2020-11-24 MED ORDER — SODIUM CHLORIDE 0.9 % IV SOLN: Freq: Once | INTRAVENOUS | Status: AC

## 2020-11-24 MED ORDER — SODIUM CHLORIDE 0.9% FLUSH
10.0000 mL | INTRAVENOUS | Status: DC | PRN
  Administered 2020-11-24: 10 mL

## 2020-11-24 MED ORDER — HEPARIN SOD (PORK) LOCK FLUSH 100 UNIT/ML IV SOLN
500.0000 [IU] | Freq: Once | INTRAVENOUS | Status: AC | PRN
  Administered 2020-11-24: 500 [IU]

## 2020-11-24 MED ORDER — DIPHENHYDRAMINE HCL 50 MG/ML IJ SOLN
25.0000 mg | Freq: Once | INTRAMUSCULAR | Status: AC
  Administered 2020-11-24: 25 mg via INTRAVENOUS
  Filled 2020-11-24: qty 1

## 2020-11-24 MED ORDER — FAMOTIDINE 20 MG IN NS 100 ML IVPB
20.0000 mg | Freq: Once | INTRAVENOUS | Status: AC
  Administered 2020-11-24: 20 mg via INTRAVENOUS
  Filled 2020-11-24: qty 100

## 2020-11-24 MED ORDER — SODIUM CHLORIDE 0.9% FLUSH
10.0000 mL | Freq: Once | INTRAVENOUS | Status: AC
  Administered 2020-11-24: 10 mL

## 2020-11-24 NOTE — Assessment & Plan Note (Signed)
He is not symptomatic We will monitor with CT imaging

## 2020-11-24 NOTE — Assessment & Plan Note (Signed)
He will continue Magic mouthwash with lidocaine as needed

## 2020-11-24 NOTE — Patient Instructions (Signed)
[facility] ONCOLOGY  Discharge Instructions: Thank you for choosing [facility] to provide your oncology and hematology care.   If you have a lab appointment with the [facility], please go directly to the [facility] and check in at the registration area.   Wear comfortable clothing and clothing appropriate for easy access to any Portacath or PICC line.   We strive to give you quality time with your provider. You may need to reschedule your appointment if you arrive late (15 or more minutes).  Arriving late affects you and other patients whose appointments are after yours.  Also, if you miss three or more appointments without notifying the office, you may be dismissed from the clinic at the provider's discretion.      For prescription refill requests, have your pharmacy contact our office and allow 72 hours for refills to be completed.    Today you received the following chemotherapy and/or immunotherapy agents: Erbitux   To help prevent nausea and vomiting after your treatment, we encourage you to take your nausea medication as directed.  BELOW ARE SYMPTOMS THAT SHOULD BE REPORTED IMMEDIATELY: *FEVER GREATER THAN 100.4 F (38 C) OR HIGHER *CHILLS OR SWEATING *NAUSEA AND VOMITING THAT IS NOT CONTROLLED WITH YOUR NAUSEA MEDICATION *UNUSUAL SHORTNESS OF BREATH *UNUSUAL BRUISING OR BLEEDING *URINARY PROBLEMS (pain or burning when urinating, or frequent urination) *BOWEL PROBLEMS (unusual diarrhea, constipation, pain near the anus) TENDERNESS IN MOUTH AND THROAT WITH OR WITHOUT PRESENCE OF ULCERS (sore throat, sores in mouth, or a toothache) UNUSUAL RASH, SWELLING OR PAIN  UNUSUAL VAGINAL DISCHARGE OR ITCHING   Items with * indicate a potential emergency and should be followed up as soon as possible or go to the Emergency Department if any problems should occur.  Please show the CHEMOTHERAPY ALERT CARD or IMMUNOTHERAPY ALERT CARD at check-in to the  Emergency Department and triage nurse.  Should you have questions after your visit or need to cancel or reschedule your appointment, please contact [facility]  Dept: [phone]  and follow the prompts.  Office hours are 8:00 a.m. to 4:30 p.m. Monday - Friday. Please note that voicemails left after 4:00 p.m. may not be returned until the following business day.  We are closed weekends and major holidays. You have access to a nurse at all times for urgent questions. Please call the main number to the clinic Dept: [phone] and follow the prompts.   For any non-urgent questions, you may also contact your provider using MyChart. We now offer e-Visits for anyone 24 and older to request care online for non-urgent symptoms. For details visit [email or web address].   Also download the MyChart app! Go to the app store, search "MyChart", open the app, select [facility], and log in with your MyChart username and password.  Due to Covid, a mask is required upon entering the hospital/clinic. If you do not have a mask, one will be given to you upon arrival. For doctor visits, patients may have 1 support person aged 102 or older with them. For treatment visits, patients cannot have anyone with them due to current Covid guidelines and our immunocompromised population.

## 2020-11-24 NOTE — Assessment & Plan Note (Signed)
His pain is stable but he has signs of mucositis and thrush He will continue using his prescription of Magic mouthwash with lidocaine

## 2020-11-24 NOTE — Progress Notes (Signed)
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 Clinically, his pain control is stable He has presence of necrotic tumor in his oropharynx but overall based on recent imaging, he has stable disease control We will proceed with treatment today as scheduled He will return next week for his long chemoinfusion treatment I will see him every other week for supportive care and assessment  Metastasis to lung [facility]) He is not symptomatic We will monitor with CT imaging  Cancer associated pain His pain is stable but he has signs of mucositis and thrush He will continue using his prescription of Magic mouthwash with lidocaine  Mucositis due to antineoplastic therapy He will continue Magic mouthwash with lidocaine as needed  No orders of the defined types were placed in this encounter.   All questions were answered. The patient knows to call the clinic with any problems, questions or concerns. The total time spent in the appointment was 30 minutes encounter with patients including review of chart and various tests results, discussions about plan of care and coordination of care plan   Heath Lark, MD 11/24/2020 2:25 PM  INTERVAL HISTORY: Please see below for problem oriented charting. he returns for treatment follow-up on cetuximab Since I prescribed clindamycin antibiotics a week ago, the drainage from his mouth has improved He is able to eat better and has started to gain some weight His pain is well controlled He continues to use Magic mouthwash with lidocaine for  mucositis No recent nausea vomiting No constipation  REVIEW OF SYSTEMS:   Constitutional: Denies fevers, chills or abnormal weight loss Eyes: Denies blurriness of vision Respiratory: Denies cough, dyspnea or wheezes Cardiovascular: Denies palpitation, chest discomfort or lower extremity swelling Gastrointestinal:  Denies nausea, heartburn or change in bowel habits Skin: Denies abnormal skin rashes Lymphatics: Denies new lymphadenopathy or easy bruising Neurological:Denies numbness, tingling or new weaknesses Behavioral/Psych: Mood is stable, no new changes  All other systems were reviewed with the patient and are negative.  I have reviewed the past medical history, past surgical history, social history and family history with the patient and they are unchanged from previous note.  ALLERGIES:  is allergic to cetuximab.  MEDICATIONS:  Current Outpatient Medications  Medication Sig Dispense Refill   loratadine (CLARITIN) 10 MG tablet Take 10 mg by mouth daily as needed for allergies.     magic mouthwash (nystatin, lidocaine, diphenhydrAMINE, alum & mag hydroxide) suspension Swish and spit 5 mls 4 times a day as needed 240 mL 1   morphine (MS CONTIN) 60 MG 12 hr tablet Take 1 tablet (60 mg total) by mouth in the morning, at noon, and at bedtime. 90 tablet 0   morphine (MSIR) 30 MG tablet Take 1 tablet (30 mg total) by mouth every 4 (four) hours as needed for severe pain. 90 tablet 0   polyethylene glycol (MIRALAX / GLYCOLAX) 17 g packet Take 17 g by mouth daily.     predniSONE (DELTASONE) 5 MG tablet Take 1 tablet (5 mg total) by mouth daily with breakfast. 30 tablet 1   prochlorperazine (COMPAZINE) 10 MG tablet Take 1 tablet (10 mg total) by  mouth every 6 (six) hours as needed for nausea or vomiting. 30 tablet 3   senna (SENOKOT) 8.6 MG tablet Take 2 tablets by mouth 2 (two) times daily.     No current facility-administered medications for this visit.   Facility-Administered  Medications Ordered in Other Visits  Medication Dose Route Frequency Provider Last Rate Last Admin   heparin lock flush 100 unit/mL  500 Units Intracatheter Once PRN Alvy Bimler, Miasha Emmons, MD       sodium chloride flush (NS) 0.9 % injection 10 mL  10 mL Intracatheter PRN Alvy Bimler, Laiba Fuerte, MD        SUMMARY OF ONCOLOGIC HISTORY: Oncology History  Tongue cancer ([place])  08/22/2011 Surgery   He had partial glossectomy which showed invasive SCC measured 1.3 cm   09/06/2011 Surgery   He underwent right neck dissection which showed 1/24 LN involved and repeat resection of tongue was negative   03/23/2012 Surgery   Repeat tngue resection showed well differentiated SCC 1.5 cm which invaded into the muscle   04/17/2012 Imaging   PET/CT scan showed New adenopathy in the left neck, compatible with recurrent malignancy   04/23/2012 Surgery   he underwent left neck LN dissection and 2/34 LN were positve   06/01/2012 - 07/13/2012 Chemotherapy   Patient had 3 cycles of cisplatin   06/12/2012 - 07/14/2012 Radiation Therapy   Patient completed RT   12/10/2012 Imaging   Interval resolution of previous hypermetabolic cervical adenopathy. There is nonspecific asymmetric increased uptake within the right side of tongue   03/13/2013 Imaging   PET/CT scan showed no evidence of disease recurrence. He was noted to have incidental kidney stones   08/15/2014 Imaging   CT scan of the dissection no evidence of disease   05/02/2020 Imaging   CT scan of neck 1. 4.3 x 2.7 x 2.7 cm peripherally enhancing mass at the anterior tongue base with focal sclerosis of the anterior genu of the mandible. 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 2. 8 mm peripherally enhancing left level 2 lymph node is concerning for metastatic disease. Recommend PET scan of the neck to further characterize disease. 3. No other significant recurrent left-sided lymph nodes. 4. Post radiation changes of the  carotid sheath bilaterally. 5. Post radiation changes at the lung apices bilaterally. 6. Polyps or mucous retention cysts within the maxillary sinuses bilaterally.   05/24/2020 Pathology Results   A. SUBMENTAL MASS, MIDLINE, NEEDLE CORE BIOPSY:  - Poorly differentiated sarcomatoid malignancy.   COMMENT:   CK7 is positive.  CKAE1/AE3, CK8/18, CK20, p40, CK5/6, S-100, Melan-A and Desmin are negative.  SMA demonstrates nonspecific staining.  Ki-67 proliferation index is high.  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     05/24/2020 Procedure   Successful ultrasound submental mass 18 gauge core biopsy   06/07/2020 PET scan   IMPRESSION: 1. Unfortunately evidence of squamous cell carcinoma recurrence in the floor of the mouth. Broad lesion with intense peripheral metabolic activity in the anterior RIGHT floor the mouth. 2. Bilateral hypermetabolic nodules within the sub submandibular which are new from PET-CT 2015. Concern for unusual metastasis to the submandibular glands. 3. New small bilateral small pulmonary nodules with faint radiotracer activity. These are also concerning for metastasis. 4. Asymmetric hypermetabolic activity within the RIGHT testicle. Favor benign inflammation; however consider testicular ultrasound if concern for unusual pattern of metastasis.   06/15/2020 Procedure   Successful placement of a right  internal jugular approach power injectable Port-A-Cath. The catheter is ready for immediate use.     06/19/2020 - 08/05/2020 Chemotherapy          08/21/2020 PET scan   1. Progressive tumor in the floor the mouth with overt permeative destructive changes involving the mandible. 2. Persistent hypermetabolic neck nodes as detailed above. No new findings. 3. Progressive pulmonary metastatic disease. 4. No findings for abdominal/pelvic metastatic disease. 5. Persistent hypermetabolism in the right hemiscrotum most  likely due to a varicocele.     08/29/2020 -  Chemotherapy   Patient is on Treatment Plan : HEAD/NECK Cisplatin + Docetaxel q21d plus cetuximab      08/31/2020 -  Chemotherapy   Patient is on Treatment Plan : HEAD/NECK Cetuximab q7d     10/27/2020 Imaging   1. Allowing for differing technique, no change in bilateral solid pulmonary nodules. 2. Several ground-glass nodules in the RIGHT upper lobe and mild tree-in-bud pattern in the RIGHT lower lobe could indicate mild pulmonary infection. 3. No lymphadenopathy.     10/27/2020 Imaging   Considerable enlargement of tumor at the anterior tongue and floor of the mouth measuring approximately 6 x 5 x 3 cm, with areas of intratumoral necrosis, particularly anterior.   Considerable progression of lytic destructive involvement of the mandible, more extensive on the right side than the left.   Enlargement of a low-density malignant level 2 node on the left, 12 mm today compared with 7 mm in April.   Metastasis to lung ([place])  06/09/2020 Initial Diagnosis   Metastasis to lung ([place])   08/31/2020 -  Chemotherapy   Patient is on Treatment Plan : HEAD/NECK Cetuximab q7d       PHYSICAL EXAMINATION: ECOG PERFORMANCE STATUS: 1 - Symptomatic but completely ambulatory  Vitals:   11/24/20 1025  BP: 122/79  Pulse: (!) 107  Resp: 18  Temp: 98.3 F (36.8 C)  SpO2: 100%   Filed Weights   11/24/20 1025  Weight: 188 lb 12.8 oz (85.6 kg)    GENERAL:alert, no distress and comfortable SKIN: skin color, texture, turgor are normal, no rashes or significant lesions EYES: normal, Conjunctiva are pink and non-injected, sclera clear OROPHARYNX: Drainage is noted from the base of his tongue with necrotic tumor in his mouth NECK: Noted facial deformity from his cancer involvement.   LYMPH:  no palpable lymphadenopathy in the cervical, axillary or inguinal LUNGS: clear to auscultation and percussion with normal breathing effort HEART: regular rate & rhythm and  no murmurs and no lower extremity edema ABDOMEN:abdomen soft, non-tender and normal bowel sounds Musculoskeletal:no cyanosis of digits and no clubbing  NEURO: alert & oriented x 3 with fluent speech, no focal motor/sensory deficits  LABORATORY DATA:  I have reviewed the data as listed    Component Value Date/Time   NA 139 11/24/2020 1013   NA 141 04/29/2016 1402   K 4.0 11/24/2020 1013   K 4.1 04/29/2016 1402   CL 102 11/24/2020 1013   CL 100 07/10/2012 1341   CO2 27 11/24/2020 1013   CO2 25 04/29/2016 1402   GLUCOSE 116 (H) 11/24/2020 1013   GLUCOSE 91 04/29/2016 1402   GLUCOSE 118 (H) 07/10/2012 1341   BUN 9 11/24/2020 1013   BUN 9.7 04/29/2016 1402   CREATININE 0.64 11/24/2020 1013   CREATININE 1.0 04/29/2016 1402   CALCIUM 9.5 11/24/2020 1013   CALCIUM 9.3 04/29/2016 1402   PROT 6.8 11/24/2020 1013   PROT 7.0 04/29/2016 1402   ALBUMIN  3.1 (L) 11/24/2020 1013   ALBUMIN 4.1 04/29/2016 1402   AST 12 (L) 11/24/2020 1013   AST 28 04/29/2016 1402   ALT 14 11/24/2020 1013   ALT 26 04/29/2016 1402   ALKPHOS 100 11/24/2020 1013   ALKPHOS 71 04/29/2016 1402   BILITOT 0.3 11/24/2020 1013   BILITOT 0.43 04/29/2016 1402   GFRNONAA >60 11/24/2020 1013   GFRAA >60 05/02/2017 0901    No results found for: SPEP, UPEP  Lab Results  Component Value Date   WBC 8.8 11/24/2020   NEUTROABS 7.7 11/24/2020   HGB 9.6 (L) 11/24/2020   HCT 30.2 (L) 11/24/2020   MCV 88.6 11/24/2020   PLT 311 11/24/2020      Chemistry      Component Value Date/Time   NA 139 11/24/2020 1013   NA 141 04/29/2016 1402   K 4.0 11/24/2020 1013   K 4.1 04/29/2016 1402   CL 102 11/24/2020 1013   CL 100 07/10/2012 1341   CO2 27 11/24/2020 1013   CO2 25 04/29/2016 1402   BUN 9 11/24/2020 1013   BUN 9.7 04/29/2016 1402   CREATININE 0.64 11/24/2020 1013   CREATININE 1.0 04/29/2016 1402      Component Value Date/Time   CALCIUM 9.5 11/24/2020 1013   CALCIUM 9.3 04/29/2016 1402   ALKPHOS 100  11/24/2020 1013   ALKPHOS 71 04/29/2016 1402   AST 12 (L) 11/24/2020 1013   AST 28 04/29/2016 1402   ALT 14 11/24/2020 1013   ALT 26 04/29/2016 1402   BILITOT 0.3 11/24/2020 1013   BILITOT 0.43 04/29/2016 1402

## 2020-11-24 NOTE — Assessment & Plan Note (Signed)
The patient has gained some weight since last time I saw him Clinically, his pain control is stable He has presence of necrotic tumor in his oropharynx but overall based on recent imaging, he has stable disease control We will proceed with treatment today as scheduled He will return next week for his long chemoinfusion treatment I will see him every other week for supportive care and assessment

## 2020-11-28 MED FILL — Dexamethasone Sodium Phosphate Inj 100 MG/10ML: INTRAMUSCULAR | Qty: 1 | Status: AC

## 2020-11-28 MED FILL — Fosaprepitant Dimeglumine For IV Infusion 150 MG (Base Eq): INTRAVENOUS | Qty: 5 | Status: AC

## 2020-11-29 ENCOUNTER — Other Ambulatory Visit: Payer: Self-pay | Admitting: Hematology and Oncology

## 2020-11-29 ENCOUNTER — Inpatient Hospital Stay: Payer: BC Managed Care – PPO | Attending: Hematology and Oncology

## 2020-11-29 ENCOUNTER — Other Ambulatory Visit: Payer: Self-pay

## 2020-11-29 ENCOUNTER — Encounter: Payer: BC Managed Care – PPO | Admitting: Dietician

## 2020-11-29 ENCOUNTER — Other Ambulatory Visit (HOSPITAL_COMMUNITY): Payer: Self-pay

## 2020-11-29 VITALS — BP 124/86 | HR 98 | Temp 97.9°F | Resp 17 | Wt 185.0 lb

## 2020-11-29 DIAGNOSIS — C029 Malignant neoplasm of tongue, unspecified: Secondary | ICD-10-CM | POA: Insufficient documentation

## 2020-11-29 DIAGNOSIS — Z5189 Encounter for other specified aftercare: Secondary | ICD-10-CM | POA: Diagnosis not present

## 2020-11-29 DIAGNOSIS — Z5111 Encounter for antineoplastic chemotherapy: Secondary | ICD-10-CM | POA: Insufficient documentation

## 2020-11-29 DIAGNOSIS — E860 Dehydration: Secondary | ICD-10-CM | POA: Insufficient documentation

## 2020-11-29 DIAGNOSIS — G893 Neoplasm related pain (acute) (chronic): Secondary | ICD-10-CM | POA: Insufficient documentation

## 2020-11-29 DIAGNOSIS — Z5112 Encounter for antineoplastic immunotherapy: Secondary | ICD-10-CM | POA: Insufficient documentation

## 2020-11-29 DIAGNOSIS — R634 Abnormal weight loss: Secondary | ICD-10-CM | POA: Diagnosis not present

## 2020-11-29 DIAGNOSIS — K1231 Oral mucositis (ulcerative) due to antineoplastic therapy: Secondary | ICD-10-CM | POA: Insufficient documentation

## 2020-11-29 DIAGNOSIS — K122 Cellulitis and abscess of mouth: Secondary | ICD-10-CM | POA: Insufficient documentation

## 2020-11-29 LAB — CBC WITH DIFFERENTIAL (CANCER CENTER ONLY)
Abs Immature Granulocytes: 0.4 10*3/uL — ABNORMAL HIGH (ref 0.00–0.07)
Basophils Absolute: 0.1 10*3/uL (ref 0.0–0.1)
Basophils Relative: 1 %
Eosinophils Absolute: 0 10*3/uL (ref 0.0–0.5)
Eosinophils Relative: 0 %
HCT: 33 % — ABNORMAL LOW (ref 39.0–52.0)
Hemoglobin: 10.8 g/dL — ABNORMAL LOW (ref 13.0–17.0)
Immature Granulocytes: 4 %
Lymphocytes Relative: 4 %
Lymphs Abs: 0.5 10*3/uL — ABNORMAL LOW (ref 0.7–4.0)
MCH: 28.4 pg (ref 26.0–34.0)
MCHC: 32.7 g/dL (ref 30.0–36.0)
MCV: 86.8 fL (ref 80.0–100.0)
Monocytes Absolute: 1.1 10*3/uL — ABNORMAL HIGH (ref 0.1–1.0)
Monocytes Relative: 10 %
Neutro Abs: 9.3 10*3/uL — ABNORMAL HIGH (ref 1.7–7.7)
Neutrophils Relative %: 81 %
Platelet Count: 462 10*3/uL — ABNORMAL HIGH (ref 150–400)
RBC: 3.8 MIL/uL — ABNORMAL LOW (ref 4.22–5.81)
RDW: 15.1 % (ref 11.5–15.5)
WBC Count: 11.4 10*3/uL — ABNORMAL HIGH (ref 4.0–10.5)
nRBC: 0 % (ref 0.0–0.2)

## 2020-11-29 LAB — CMP (CANCER CENTER ONLY)
ALT: 16 U/L (ref 0–44)
AST: 13 U/L — ABNORMAL LOW (ref 15–41)
Albumin: 3.3 g/dL — ABNORMAL LOW (ref 3.5–5.0)
Alkaline Phosphatase: 114 U/L (ref 38–126)
Anion gap: 11 (ref 5–15)
BUN: 14 mg/dL (ref 6–20)
CO2: 27 mmol/L (ref 22–32)
Calcium: 9.1 mg/dL (ref 8.9–10.3)
Chloride: 99 mmol/L (ref 98–111)
Creatinine: 0.61 mg/dL (ref 0.61–1.24)
GFR, Estimated: >60 mL/min (ref >60)
Glucose, Bld: 103 mg/dL — ABNORMAL HIGH (ref 70–99)
Potassium: 3.9 mmol/L (ref 3.5–5.1)
Sodium: 137 mmol/L (ref 135–145)
Total Bilirubin: 0.3 mg/dL (ref 0.3–1.2)
Total Protein: 7.2 g/dL (ref 6.5–8.1)

## 2020-11-29 LAB — MAGNESIUM: Magnesium: 1.9 mg/dL (ref 1.7–2.4)

## 2020-11-29 MED ORDER — SODIUM CHLORIDE 0.9 % IV SOLN: Freq: Once | INTRAVENOUS | Status: AC

## 2020-11-29 MED ORDER — SODIUM CHLORIDE 0.9 % IV SOLN
150.0000 mg | Freq: Once | INTRAVENOUS | Status: AC
  Administered 2020-11-29: 150 mg via INTRAVENOUS
  Filled 2020-11-29: qty 150

## 2020-11-29 MED ORDER — SODIUM CHLORIDE 0.9% FLUSH: 10.0000 mL | INTRAVENOUS | Status: DC | PRN

## 2020-11-29 MED ORDER — MAGNESIUM SULFATE 2 GM/50ML IV SOLN
2.0000 g | Freq: Once | INTRAVENOUS | Status: AC
  Administered 2020-11-29: 2 g via INTRAVENOUS
  Filled 2020-11-29: qty 50

## 2020-11-29 MED ORDER — POTASSIUM CHLORIDE IN NACL 20-0.9 MEQ/L-% IV SOLN
Freq: Once | INTRAVENOUS | Status: AC
  Filled 2020-11-29: qty 1000

## 2020-11-29 MED ORDER — FLUCONAZOLE 100 MG PO TABS
100.0000 mg | ORAL_TABLET | Freq: Every day | ORAL | 0 refills | Status: DC
Start: 2020-11-29 — End: 2020-12-19
  Filled 2020-11-29: qty 7, 7d supply, fill #0

## 2020-11-29 MED ORDER — PALONOSETRON HCL INJECTION 0.25 MG/5ML
0.2500 mg | Freq: Once | INTRAVENOUS | Status: AC
  Administered 2020-11-29: 0.25 mg via INTRAVENOUS
  Filled 2020-11-29: qty 5

## 2020-11-29 MED ORDER — HEPARIN SOD (PORK) LOCK FLUSH 100 UNIT/ML IV SOLN: 500.0000 [IU] | Freq: Once | INTRAVENOUS | Status: DC | PRN

## 2020-11-29 MED ORDER — SODIUM CHLORIDE 0.9 % IV SOLN
10.0000 mg | Freq: Once | INTRAVENOUS | Status: AC
  Administered 2020-11-29: 10 mg via INTRAVENOUS
  Filled 2020-11-29: qty 10

## 2020-11-29 MED ORDER — SODIUM CHLORIDE 0.9 % IV SOLN
60.0000 mg/m2 | Freq: Once | INTRAVENOUS | Status: AC
  Administered 2020-11-29: 130 mg via INTRAVENOUS
  Filled 2020-11-29: qty 13

## 2020-11-29 MED ORDER — SODIUM CHLORIDE 0.9 % IV SOLN
75.0000 mg/m2 | Freq: Once | INTRAVENOUS | Status: AC
  Administered 2020-11-29: 159 mg via INTRAVENOUS
  Filled 2020-11-29: qty 159

## 2020-11-29 NOTE — Progress Notes (Signed)
Ok to use labs from 10/28 per Dr. Alvy Bimler for treatment today

## 2020-11-29 NOTE — Patient Instructions (Signed)
[facility] ONCOLOGY  Discharge Instructions: Thank you for choosing [facility] to provide your oncology and hematology care.   If you have a lab appointment with the [facility], please go directly to the [facility] and check in at the registration area.   Wear comfortable clothing and clothing appropriate for easy access to any Portacath or PICC line.   We strive to give you quality time with your provider. You may need to reschedule your appointment if you arrive late (15 or more minutes).  Arriving late affects you and other patients whose appointments are after yours.  Also, if you miss three or more appointments without notifying the office, you may be dismissed from the clinic at the provider's discretion.      For prescription refill requests, have your pharmacy contact our office and allow 72 hours for refills to be completed.    Today you received the following chemotherapy and/or immunotherapy agents Cisplatin and Docetaxel      To help prevent nausea and vomiting after your treatment, we encourage you to take your nausea medication as directed.  BELOW ARE SYMPTOMS THAT SHOULD BE REPORTED IMMEDIATELY: *FEVER GREATER THAN 100.4 F (38 C) OR HIGHER *CHILLS OR SWEATING *NAUSEA AND VOMITING THAT IS NOT CONTROLLED WITH YOUR NAUSEA MEDICATION *UNUSUAL SHORTNESS OF BREATH *UNUSUAL BRUISING OR BLEEDING *URINARY PROBLEMS (pain or burning when urinating, or frequent urination) *BOWEL PROBLEMS (unusual diarrhea, constipation, pain near the anus) TENDERNESS IN MOUTH AND THROAT WITH OR WITHOUT PRESENCE OF ULCERS (sore throat, sores in mouth, or a toothache) UNUSUAL RASH, SWELLING OR PAIN  UNUSUAL VAGINAL DISCHARGE OR ITCHING   Items with * indicate a potential emergency and should be followed up as soon as possible or go to the Emergency Department if any problems should occur.  Please show the CHEMOTHERAPY ALERT CARD or IMMUNOTHERAPY ALERT CARD at  check-in to the Emergency Department and triage nurse.  Should you have questions after your visit or need to cancel or reschedule your appointment, please contact [facility]  Dept: [phone]  and follow the prompts.  Office hours are 8:00 a.m. to 4:30 p.m. Monday - Friday. Please note that voicemails left after 4:00 p.m. may not be returned until the following business day.  We are closed weekends and major holidays. You have access to a nurse at all times for urgent questions. Please call the main number to the clinic Dept: [phone] and follow the prompts.   For any non-urgent questions, you may also contact your provider using MyChart. We now offer e-Visits for anyone 24 and older to request care online for non-urgent symptoms. For details visit [email or web address].   Also download the MyChart app! Go to the app store, search "MyChart", open the app, select [facility], and log in with your MyChart username and password.  Due to Covid, a mask is required upon entering the hospital/clinic. If you do not have a mask, one will be given to you upon arrival. For doctor visits, patients may have 1 support person aged 46 or older with them. For treatment visits, patients cannot have anyone with them due to current Covid guidelines and our immunocompromised population.

## 2020-12-01 ENCOUNTER — Other Ambulatory Visit: Payer: Self-pay

## 2020-12-01 ENCOUNTER — Inpatient Hospital Stay: Payer: BC Managed Care – PPO | Admitting: Dietician

## 2020-12-01 ENCOUNTER — Inpatient Hospital Stay: Payer: BC Managed Care – PPO

## 2020-12-01 VITALS — BP 107/75 | HR 93 | Temp 98.1°F | Resp 18 | Ht 74.0 in | Wt 183.0 lb

## 2020-12-01 DIAGNOSIS — C7802 Secondary malignant neoplasm of left lung: Secondary | ICD-10-CM

## 2020-12-01 DIAGNOSIS — C7801 Secondary malignant neoplasm of right lung: Secondary | ICD-10-CM

## 2020-12-01 DIAGNOSIS — Z5112 Encounter for antineoplastic immunotherapy: Secondary | ICD-10-CM | POA: Diagnosis not present

## 2020-12-01 DIAGNOSIS — C029 Malignant neoplasm of tongue, unspecified: Secondary | ICD-10-CM

## 2020-12-01 LAB — CBC WITH DIFFERENTIAL (CANCER CENTER ONLY)
Abs Immature Granulocytes: 0.12 10*3/uL — ABNORMAL HIGH (ref 0.00–0.07)
Basophils Absolute: 0 10*3/uL (ref 0.0–0.1)
Basophils Relative: 0 %
Eosinophils Absolute: 0 10*3/uL (ref 0.0–0.5)
Eosinophils Relative: 0 %
HCT: 32.7 % — ABNORMAL LOW (ref 39.0–52.0)
Hemoglobin: 10.4 g/dL — ABNORMAL LOW (ref 13.0–17.0)
Immature Granulocytes: 1 %
Lymphocytes Relative: 3 %
Lymphs Abs: 0.3 10*3/uL — ABNORMAL LOW (ref 0.7–4.0)
MCH: 27.7 pg (ref 26.0–34.0)
MCHC: 31.8 g/dL (ref 30.0–36.0)
MCV: 87 fL (ref 80.0–100.0)
Monocytes Absolute: 0.3 10*3/uL (ref 0.1–1.0)
Monocytes Relative: 3 %
Neutro Abs: 8.1 10*3/uL — ABNORMAL HIGH (ref 1.7–7.7)
Neutrophils Relative %: 93 %
Platelet Count: 392 10*3/uL (ref 150–400)
RBC: 3.76 MIL/uL — ABNORMAL LOW (ref 4.22–5.81)
RDW: 14.9 % (ref 11.5–15.5)
WBC Count: 8.8 10*3/uL (ref 4.0–10.5)
nRBC: 0 % (ref 0.0–0.2)

## 2020-12-01 LAB — CMP (CANCER CENTER ONLY)
ALT: 24 U/L (ref 0–44)
AST: 25 U/L (ref 15–41)
Albumin: 3.2 g/dL — ABNORMAL LOW (ref 3.5–5.0)
Alkaline Phosphatase: 94 U/L (ref 38–126)
Anion gap: 12 (ref 5–15)
BUN: 17 mg/dL (ref 6–20)
CO2: 23 mmol/L (ref 22–32)
Calcium: 8.8 mg/dL — ABNORMAL LOW (ref 8.9–10.3)
Chloride: 101 mmol/L (ref 98–111)
Creatinine: 0.67 mg/dL (ref 0.61–1.24)
GFR, Estimated: >60 mL/min (ref >60)
Glucose, Bld: 98 mg/dL (ref 70–99)
Potassium: 3.6 mmol/L (ref 3.5–5.1)
Sodium: 136 mmol/L (ref 135–145)
Total Bilirubin: 0.3 mg/dL (ref 0.3–1.2)
Total Protein: 6.8 g/dL (ref 6.5–8.1)

## 2020-12-01 LAB — MAGNESIUM: Magnesium: 1.9 mg/dL (ref 1.7–2.4)

## 2020-12-01 MED ORDER — HEPARIN SOD (PORK) LOCK FLUSH 100 UNIT/ML IV SOLN
500.0000 [IU] | Freq: Once | INTRAVENOUS | Status: AC | PRN
  Administered 2020-12-01: 500 [IU]

## 2020-12-01 MED ORDER — FAMOTIDINE 20 MG IN NS 100 ML IVPB
20.0000 mg | Freq: Once | INTRAVENOUS | Status: AC
  Administered 2020-12-01: 20 mg via INTRAVENOUS
  Filled 2020-12-01: qty 100

## 2020-12-01 MED ORDER — SODIUM CHLORIDE 0.9% FLUSH
10.0000 mL | INTRAVENOUS | Status: DC | PRN
  Administered 2020-12-01: 10 mL

## 2020-12-01 MED ORDER — SODIUM CHLORIDE 0.9% FLUSH
10.0000 mL | Freq: Once | INTRAVENOUS | Status: AC
  Administered 2020-12-01: 10 mL

## 2020-12-01 MED ORDER — EMPTY CONTAINERS FLEXIBLE MISC
150.0000 mg/m2 | Freq: Once | Status: AC
  Administered 2020-12-01: 300 mg via INTRAVENOUS
  Filled 2020-12-01: qty 100

## 2020-12-01 MED ORDER — METHYLPREDNISOLONE SODIUM SUCC 125 MG IJ SOLR
125.0000 mg | Freq: Once | INTRAMUSCULAR | Status: AC
  Administered 2020-12-01: 125 mg via INTRAVENOUS
  Filled 2020-12-01: qty 2

## 2020-12-01 MED ORDER — SODIUM CHLORIDE 0.9 % IV SOLN: Freq: Once | INTRAVENOUS | Status: AC

## 2020-12-01 MED ORDER — DIPHENHYDRAMINE HCL 50 MG/ML IJ SOLN
25.0000 mg | Freq: Once | INTRAMUSCULAR | Status: AC
  Administered 2020-12-01: 25 mg via INTRAVENOUS
  Filled 2020-12-01: qty 1

## 2020-12-01 NOTE — Progress Notes (Signed)
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 S/p partial glossectomy in 2013, concurrent chemoradiation completed in 2014.   Met with patient during infusion. He reports no appetite, but is forcing himself to eat. Patient continues drinking Boost VHC and Ensure supplements. He mixes them together sometimes with added milk and chocolate syrup. Patient drinks 4 Gatorades and a few bottles of water. Patient reports his intake has been good until last couple of days. Patient reports 2-3 days of nausea with vomiting after treatments. He denies diarrhea, constipation. Patient had prior feeding tube, he does not want another one.    Medications: reviewed  Labs: reviewed  Anthropometrics: Weight 183 lb today decreased 2 lbs (1.2%) in the last 2 days and 5 lbs (2.7%) in one week. This is severe.   11/2 - 185 lb 10/28 - 188 lb 12.8 oz 10/21 - 176 lb 12 oz 10/14 - 187 lb 4 oz 10/3 - 189 lb  MALNUTRITION DIAGNOSIS: Severe malnutrition continues   INTERVENTION:  Patient does not wish to have feeding tube  Continue drinking 4 Boost VHC (530 kcal, 22 grams protein) as tolerated Encouraged 2 Ensure Enlive/equivalent daily for added calories and protein Complimentary Ensure Enlive case provided today Continue strategies for increasing calories and protein Patient tried shake recipes, found them too sweet Reviewed tips for nausea, encouraged taking nausea medication as prescribed and waiting 30 minutes before oral intake Patient has contact information  MONITORING, EVALUATION, GOAL: weight trends, intake   NEXT VISIT: Friday November 11 during infusion

## 2020-12-01 NOTE — Patient Instructions (Signed)
[facility] ONCOLOGY  Discharge Instructions: Thank you for choosing [facility] to provide your oncology and hematology care.   If you have a lab appointment with the [facility], please go directly to the [facility] and check in at the registration area.   Wear comfortable clothing and clothing appropriate for easy access to any Portacath or PICC line.   We strive to give you quality time with your provider. You may need to reschedule your appointment if you arrive late (15 or more minutes).  Arriving late affects you and other patients whose appointments are after yours.  Also, if you miss three or more appointments without notifying the office, you may be dismissed from the clinic at the provider's discretion.      For prescription refill requests, have your pharmacy contact our office and allow 72 hours for refills to be completed.    Today you received the following chemotherapy and/or immunotherapy agents: Erbitux   To help prevent nausea and vomiting after your treatment, we encourage you to take your nausea medication as directed.  BELOW ARE SYMPTOMS THAT SHOULD BE REPORTED IMMEDIATELY: *FEVER GREATER THAN 100.4 F (38 C) OR HIGHER *CHILLS OR SWEATING *NAUSEA AND VOMITING THAT IS NOT CONTROLLED WITH YOUR NAUSEA MEDICATION *UNUSUAL SHORTNESS OF BREATH *UNUSUAL BRUISING OR BLEEDING *URINARY PROBLEMS (pain or burning when urinating, or frequent urination) *BOWEL PROBLEMS (unusual diarrhea, constipation, pain near the anus) TENDERNESS IN MOUTH AND THROAT WITH OR WITHOUT PRESENCE OF ULCERS (sore throat, sores in mouth, or a toothache) UNUSUAL RASH, SWELLING OR PAIN  UNUSUAL VAGINAL DISCHARGE OR ITCHING   Items with * indicate a potential emergency and should be followed up as soon as possible or go to the Emergency Department if any problems should occur.  Please show the CHEMOTHERAPY ALERT CARD or IMMUNOTHERAPY ALERT CARD at check-in to the  Emergency Department and triage nurse.  Should you have questions after your visit or need to cancel or reschedule your appointment, please contact [facility]  Dept: [phone]  and follow the prompts.  Office hours are 8:00 a.m. to 4:30 p.m. Monday - Friday. Please note that voicemails left after 4:00 p.m. may not be returned until the following business day.  We are closed weekends and major holidays. You have access to a nurse at all times for urgent questions. Please call the main number to the clinic Dept: [phone] and follow the prompts.   For any non-urgent questions, you may also contact your provider using MyChart. We now offer e-Visits for anyone 24 and older to request care online for non-urgent symptoms. For details visit [email or web address].   Also download the MyChart app! Go to the app store, search "MyChart", open the app, select [facility], and log in with your MyChart username and password.  Due to Covid, a mask is required upon entering the hospital/clinic. If you do not have a mask, one will be given to you upon arrival. For doctor visits, patients may have 1 support person aged 102 or older with them. For treatment visits, patients cannot have anyone with them due to current Covid guidelines and our immunocompromised population.

## 2020-12-06 ENCOUNTER — Inpatient Hospital Stay (HOSPITAL_BASED_OUTPATIENT_CLINIC_OR_DEPARTMENT_OTHER): Payer: BC Managed Care – PPO | Admitting: Hematology and Oncology

## 2020-12-06 ENCOUNTER — Other Ambulatory Visit (HOSPITAL_COMMUNITY): Payer: Self-pay

## 2020-12-06 ENCOUNTER — Encounter: Payer: Self-pay | Admitting: Hematology and Oncology

## 2020-12-06 ENCOUNTER — Other Ambulatory Visit: Payer: Self-pay | Admitting: Hematology and Oncology

## 2020-12-06 ENCOUNTER — Inpatient Hospital Stay: Payer: BC Managed Care – PPO

## 2020-12-06 ENCOUNTER — Other Ambulatory Visit: Payer: Self-pay

## 2020-12-06 VITALS — BP 98/66 | HR 78

## 2020-12-06 VITALS — BP 107/67 | HR 86 | Temp 97.3°F | Resp 19 | Ht 74.0 in | Wt 174.4 lb

## 2020-12-06 DIAGNOSIS — R634 Abnormal weight loss: Secondary | ICD-10-CM

## 2020-12-06 DIAGNOSIS — C029 Malignant neoplasm of tongue, unspecified: Secondary | ICD-10-CM

## 2020-12-06 DIAGNOSIS — Z5112 Encounter for antineoplastic immunotherapy: Secondary | ICD-10-CM | POA: Diagnosis not present

## 2020-12-06 DIAGNOSIS — E860 Dehydration: Secondary | ICD-10-CM | POA: Insufficient documentation

## 2020-12-06 DIAGNOSIS — K1231 Oral mucositis (ulcerative) due to antineoplastic therapy: Secondary | ICD-10-CM

## 2020-12-06 LAB — CBC WITH DIFFERENTIAL (CANCER CENTER ONLY)
Abs Immature Granulocytes: 0.04 10*3/uL (ref 0.00–0.07)
Basophils Absolute: 0.1 10*3/uL (ref 0.0–0.1)
Basophils Relative: 1 %
Eosinophils Absolute: 0 10*3/uL (ref 0.0–0.5)
Eosinophils Relative: 0 %
HCT: 33.9 % — ABNORMAL LOW (ref 39.0–52.0)
Hemoglobin: 11.3 g/dL — ABNORMAL LOW (ref 13.0–17.0)
Immature Granulocytes: 1 %
Lymphocytes Relative: 6 %
Lymphs Abs: 0.2 10*3/uL — ABNORMAL LOW (ref 0.7–4.0)
MCH: 28 pg (ref 26.0–34.0)
MCHC: 33.3 g/dL (ref 30.0–36.0)
MCV: 83.9 fL (ref 80.0–100.0)
Monocytes Absolute: 0.1 10*3/uL (ref 0.1–1.0)
Monocytes Relative: 3 %
Neutro Abs: 3.6 10*3/uL (ref 1.7–7.7)
Neutrophils Relative %: 89 %
Platelet Count: 445 10*3/uL — ABNORMAL HIGH (ref 150–400)
RBC: 4.04 MIL/uL — ABNORMAL LOW (ref 4.22–5.81)
RDW: 14.6 % (ref 11.5–15.5)
WBC Count: 4 10*3/uL (ref 4.0–10.5)
nRBC: 0 % (ref 0.0–0.2)

## 2020-12-06 LAB — CMP (CANCER CENTER ONLY)
ALT: 33 U/L (ref 0–44)
AST: 22 U/L (ref 15–41)
Albumin: 3.5 g/dL (ref 3.5–5.0)
Alkaline Phosphatase: 103 U/L (ref 38–126)
Anion gap: 11 (ref 5–15)
BUN: 13 mg/dL (ref 6–20)
CO2: 23 mmol/L (ref 22–32)
Calcium: 9.1 mg/dL (ref 8.9–10.3)
Chloride: 97 mmol/L — ABNORMAL LOW (ref 98–111)
Creatinine: 0.66 mg/dL (ref 0.61–1.24)
GFR, Estimated: >60 mL/min (ref >60)
Glucose, Bld: 149 mg/dL — ABNORMAL HIGH (ref 70–99)
Potassium: 3.6 mmol/L (ref 3.5–5.1)
Sodium: 131 mmol/L — ABNORMAL LOW (ref 135–145)
Total Bilirubin: 0.6 mg/dL (ref 0.3–1.2)
Total Protein: 7.2 g/dL (ref 6.5–8.1)

## 2020-12-06 LAB — MAGNESIUM: Magnesium: 1.9 mg/dL (ref 1.7–2.4)

## 2020-12-06 MED ORDER — CLINDAMYCIN HCL 300 MG PO CAPS
300.0000 mg | ORAL_CAPSULE | Freq: Three times a day (TID) | ORAL | 0 refills | Status: DC
  Filled 2020-12-06: qty 21, 7d supply, fill #0

## 2020-12-06 MED ORDER — PEGFILGRASTIM-JMDB 6 MG/0.6ML ~~LOC~~ SOSY
6.0000 mg | PREFILLED_SYRINGE | Freq: Once | SUBCUTANEOUS | Status: AC
  Administered 2020-12-06: 6 mg via SUBCUTANEOUS
  Filled 2020-12-06: qty 0.6

## 2020-12-06 MED ORDER — SODIUM CHLORIDE 0.9 % IV SOLN: Freq: Once | INTRAVENOUS | Status: AC

## 2020-12-06 NOTE — Assessment & Plan Note (Signed)
He continues on the trajectory of abnormal weight loss He is in agreement for placement of feeding tube I will consult dietitian for prescription of tube feeding

## 2020-12-06 NOTE — Assessment & Plan Note (Signed)
He has persistent drainage of necrotic tumor from his oropharynx He is struggling to maintain oral intake I recommend a course of antibiotics and to placement of feeding tube We discussed the risk and benefits of antibiotic treatment and he is in agreement

## 2020-12-06 NOTE — Assessment & Plan Note (Signed)
He is tachycardic and dehydrated We will give him IV fluid support prior to discharge

## 2020-12-06 NOTE — Progress Notes (Signed)
[facility] OFFICE PROGRESS NOTE  Patient Care Team: Redmond School, MD as PCP - General (Internal Medicine) [name], MD as Attending Physician (Otolaryngology) Gery Pray, MD as Attending Physician (Radiation Oncology) Heath Lark, MD as Consulting Physician (Hematology and Oncology) Daneil Dolin, MD as Consulting Physician (Gastroenterology)  ASSESSMENT & PLAN:  Tongue cancer Overall, the patient continues to have trajectory of decline He is struggling maintaining oral intake and has progressive weight loss The patient is also clinically dehydrated I recommend placement of feeding tube for additional nutritional support and he is in agreement Today, we will give him IV fluid support before discharging him  Mucositis due to antineoplastic therapy He has persistent drainage of necrotic tumor from his oropharynx He is struggling to maintain oral intake I recommend a course of antibiotics and to placement of feeding tube We discussed the risk and benefits of antibiotic treatment and he is in agreement  Weight loss, abnormal He continues on the trajectory of abnormal weight loss He is in agreement for placement of feeding tube I will consult dietitian for prescription of tube feeding  Dehydration He is tachycardic and dehydrated We will give him IV fluid support prior to discharge  Orders Placed This Encounter  Procedures   IR Gastrostomy Tube    Standing Status:   Future    Standing Expiration Date:   12/06/2021    Order Specific Question:   Reason for exam:    Answer:   recurrent tongue cancer, dysphagia, malnutrition    Order Specific Question:   Preferred Imaging Location?    Answer:   [facility]    All questions were answered. The patient knows to call the clinic with any problems, questions or concerns. The total time spent in the appointment was 40 minutes encounter with patients including review of chart and various tests results,  discussions about plan of care and coordination of care plan   Heath Lark, MD 12/06/2020 10:27 AM  INTERVAL HISTORY: Please see below for problem oriented charting. he returns for treatment follow-up with his wife due to inability to maintain weight and oral intake He continues to have drainage from necrotic tumor inside his mouth The patient struggle to maintain oral intake through nutritional supplement He has difficulties with swallowing He denies pain The patient was evaluated a second time due to syncopal episode in the bathroom He hit his head but no major injuries  REVIEW OF SYSTEMS:   Constitutional: Denies fevers, chills  Eyes: Denies blurriness of vision Respiratory: Denies cough, dyspnea or wheezes Cardiovascular: Denies palpitation, chest discomfort or lower extremity swelling Gastrointestinal:  Denies nausea, heartburn or change in bowel habits Skin: Denies abnormal skin rashes Lymphatics: Denies new lymphadenopathy or easy bruising Neurological:Denies numbness, tingling or new weaknesses Behavioral/Psych: Mood is stable, no new changes  All other systems were reviewed with the patient and are negative.  I have reviewed the past medical history, past surgical history, social history and family history with the patient and they are unchanged from previous note.  ALLERGIES:  is allergic to cetuximab.  MEDICATIONS:  Current Outpatient Medications  Medication Sig Dispense Refill   clindamycin (CLEOCIN) 300 MG capsule Take 1 capsule (300 mg total) by mouth 3 (three) times daily. 21 capsule 0   fluconazole (DIFLUCAN) 100 MG tablet Take 1 tablet (100 mg total) by mouth daily. 7 tablet 0   loratadine (CLARITIN) 10 MG tablet Take 10 mg by mouth daily as needed for allergies.  magic mouthwash (nystatin, lidocaine, diphenhydrAMINE, alum & mag hydroxide) suspension Swish and spit 5 mls 4 times a day as needed 240 mL 1   morphine (MS CONTIN) 60 MG 12 hr tablet Take 1 tablet  (60 mg total) by mouth in the morning, at noon, and at bedtime. 90 tablet 0   morphine (MSIR) 30 MG tablet Take 1 tablet (30 mg total) by mouth every 4 (four) hours as needed for severe pain. 90 tablet 0   polyethylene glycol (MIRALAX / GLYCOLAX) 17 g packet Take 17 g by mouth daily.     predniSONE (DELTASONE) 5 MG tablet Take 1 tablet (5 mg total) by mouth daily with breakfast. 30 tablet 1   prochlorperazine (COMPAZINE) 10 MG tablet Take 1 tablet (10 mg total) by mouth every 6 (six) hours as needed for nausea or vomiting. 30 tablet 3   senna (SENOKOT) 8.6 MG tablet Take 2 tablets by mouth 2 (two) times daily.     No current facility-administered medications for this visit.    SUMMARY OF ONCOLOGIC HISTORY: Oncology History  Tongue cancer ([place])  08/22/2011 Surgery   He had partial glossectomy which showed invasive SCC measured 1.3 cm   09/06/2011 Surgery   He underwent right neck dissection which showed 1/24 LN involved and repeat resection of tongue was negative   03/23/2012 Surgery   Repeat tngue resection showed well differentiated SCC 1.5 cm which invaded into the muscle   04/17/2012 Imaging   PET/CT scan showed New adenopathy in the left neck, compatible with recurrent malignancy   04/23/2012 Surgery   he underwent left neck LN dissection and 2/34 LN were positve   06/01/2012 - 07/13/2012 Chemotherapy   Patient had 3 cycles of cisplatin   06/12/2012 - 07/14/2012 Radiation Therapy   Patient completed RT   12/10/2012 Imaging   Interval resolution of previous hypermetabolic cervical adenopathy. There is nonspecific asymmetric increased uptake within the right side of tongue   03/13/2013 Imaging   PET/CT scan showed no evidence of disease recurrence. He was noted to have incidental kidney stones   08/15/2014 Imaging   CT scan of the dissection no evidence of disease   05/02/2020 Imaging   CT scan of neck 1. 4.3 x 2.7 x 2.7 cm peripherally enhancing mass at the anterior tongue base with  focal sclerosis of the anterior genu of the mandible. 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 2. 8 mm peripherally enhancing left level 2 lymph node is concerning for metastatic disease. Recommend PET scan of the neck to further characterize disease. 3. No other significant recurrent left-sided lymph nodes. 4. Post radiation changes of the carotid sheath bilaterally. 5. Post radiation changes at the lung apices bilaterally. 6. Polyps or mucous retention cysts within the maxillary sinuses bilaterally.   05/24/2020 Pathology Results   A. SUBMENTAL MASS, MIDLINE, NEEDLE CORE BIOPSY:  - Poorly differentiated sarcomatoid malignancy.   COMMENT:   CK7 is positive.  CKAE1/AE3, CK8/18, CK20, p40, CK5/6, S-100, Melan-A and Desmin are negative.  SMA demonstrates nonspecific staining.  Ki-67 proliferation index is high.  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     05/24/2020 Procedure   Successful ultrasound submental mass 18 gauge core biopsy   06/07/2020 PET scan   IMPRESSION: 1. Unfortunately evidence of squamous cell carcinoma recurrence in the floor of the mouth. Broad lesion with intense peripheral metabolic activity in the anterior RIGHT floor the mouth.  2. Bilateral hypermetabolic nodules within the sub submandibular which are new from PET-CT 2015. Concern for unusual metastasis to the submandibular glands. 3. New small bilateral small pulmonary nodules with faint radiotracer activity. These are also concerning for metastasis. 4. Asymmetric hypermetabolic activity within the RIGHT testicle. Favor benign inflammation; however consider testicular ultrasound if concern for unusual pattern of metastasis.   06/15/2020 Procedure   Successful placement of a right internal jugular approach power injectable Port-A-Cath. The catheter is ready for immediate use.     06/19/2020 - 08/05/2020  Chemotherapy          08/21/2020 PET scan   1. Progressive tumor in the floor the mouth with overt permeative destructive changes involving the mandible. 2. Persistent hypermetabolic neck nodes as detailed above. No new findings. 3. Progressive pulmonary metastatic disease. 4. No findings for abdominal/pelvic metastatic disease. 5. Persistent hypermetabolism in the right hemiscrotum most likely due to a varicocele.     08/29/2020 -  Chemotherapy   Patient is on Treatment Plan : HEAD/NECK Cisplatin + Docetaxel q21d plus cetuximab      08/31/2020 -  Chemotherapy   Patient is on Treatment Plan : HEAD/NECK Cetuximab q7d     10/27/2020 Imaging   1. Allowing for differing technique, no change in bilateral solid pulmonary nodules. 2. Several ground-glass nodules in the RIGHT upper lobe and mild tree-in-bud pattern in the RIGHT lower lobe could indicate mild pulmonary infection. 3. No lymphadenopathy.     10/27/2020 Imaging   Considerable enlargement of tumor at the anterior tongue and floor of the mouth measuring approximately 6 x 5 x 3 cm, with areas of intratumoral necrosis, particularly anterior.   Considerable progression of lytic destructive involvement of the mandible, more extensive on the right side than the left.   Enlargement of a low-density malignant level 2 node on the left, 12 mm today compared with 7 mm in April.   Metastasis to lung ([place])  06/09/2020 Initial Diagnosis   Metastasis to lung ([place])   08/31/2020 -  Chemotherapy   Patient is on Treatment Plan : HEAD/NECK Cetuximab q7d       PHYSICAL EXAMINATION: ECOG PERFORMANCE STATUS: 2 - Symptomatic, <50% confined to bed  Vitals:   12/06/20 0837 12/06/20 0917  BP: 106/75 107/67  Pulse: (!) 121 86  Resp: 19   Temp: (!) 97.3 F (36.3 C)   SpO2: 100% 100%   Filed Weights   12/06/20 0837  Weight: 174 lb 6.4 oz (79.1 kg)    GENERAL:alert, no distress and comfortable SKIN: skin color, texture, turgor are normal, no  rashes or significant lesions EYES: normal, Conjunctiva are pink and non-injected, sclera clear OROPHARYNX: Noted necrotic tumor at the base of the tongue. NECK: Noted neck deformity from prior surgery and radiation LYMPH:  no palpable lymphadenopathy in the cervical, axillary or inguinal LUNGS: clear to auscultation and percussion with normal breathing effort HEART: tachycardia no murmurs and no lower extremity edema ABDOMEN:abdomen soft, non-tender and normal bowel sounds Musculoskeletal:no cyanosis of digits and no clubbing  NEURO: alert & oriented x 3 with fluent speech, no focal motor/sensory deficits  LABORATORY DATA:  I have reviewed the data as listed    Component Value Date/Time   NA 131 (L) 12/06/2020 0906   NA 141 04/29/2016 1402   K 3.6 12/06/2020 0906   K 4.1 04/29/2016 1402   CL 97 (L) 12/06/2020 0906   CL 100 07/10/2012 1341   CO2 23 12/06/2020 0906   CO2 25 04/29/2016  1402   GLUCOSE 149 (H) 12/06/2020 0906   GLUCOSE 91 04/29/2016 1402   GLUCOSE 118 (H) 07/10/2012 1341   BUN 13 12/06/2020 0906   BUN 9.7 04/29/2016 1402   CREATININE 0.66 12/06/2020 0906   CREATININE 1.0 04/29/2016 1402   CALCIUM 9.1 12/06/2020 0906   CALCIUM 9.3 04/29/2016 1402   PROT 7.2 12/06/2020 0906   PROT 7.0 04/29/2016 1402   ALBUMIN 3.5 12/06/2020 0906   ALBUMIN 4.1 04/29/2016 1402   AST 22 12/06/2020 0906   AST 28 04/29/2016 1402   ALT 33 12/06/2020 0906   ALT 26 04/29/2016 1402   ALKPHOS 103 12/06/2020 0906   ALKPHOS 71 04/29/2016 1402   BILITOT 0.6 12/06/2020 0906   BILITOT 0.43 04/29/2016 1402   GFRNONAA >60 12/06/2020 0906   GFRAA >60 05/02/2017 0901    No results found for: SPEP, UPEP  Lab Results  Component Value Date   WBC 4.0 12/06/2020   NEUTROABS 3.6 12/06/2020   HGB 11.3 (L) 12/06/2020   HCT 33.9 (L) 12/06/2020   MCV 83.9 12/06/2020   PLT 445 (H) 12/06/2020      Chemistry      Component Value Date/Time   NA 131 (L) 12/06/2020 0906   NA 141 04/29/2016  1402   K 3.6 12/06/2020 0906   K 4.1 04/29/2016 1402   CL 97 (L) 12/06/2020 0906   CL 100 07/10/2012 1341   CO2 23 12/06/2020 0906   CO2 25 04/29/2016 1402   BUN 13 12/06/2020 0906   BUN 9.7 04/29/2016 1402   CREATININE 0.66 12/06/2020 0906   CREATININE 1.0 04/29/2016 1402      Component Value Date/Time   CALCIUM 9.1 12/06/2020 0906   CALCIUM 9.3 04/29/2016 1402   ALKPHOS 103 12/06/2020 0906   ALKPHOS 71 04/29/2016 1402   AST 22 12/06/2020 0906   AST 28 04/29/2016 1402   ALT 33 12/06/2020 0906   ALT 26 04/29/2016 1402   BILITOT 0.6 12/06/2020 0906   BILITOT 0.43 04/29/2016 1402

## 2020-12-06 NOTE — Assessment & Plan Note (Signed)
Overall, the patient continues to have trajectory of decline He is struggling maintaining oral intake and has progressive weight loss The patient is also clinically dehydrated I recommend placement of feeding tube for additional nutritional support and he is in agreement Today, we will give him IV fluid support before discharging him

## 2020-12-06 NOTE — Patient Instructions (Signed)

## 2020-12-06 NOTE — Addendum Note (Signed)
Addended by: Gardiner Rhyme on: 12/06/2020 12:46 PM   Modules accepted: Orders

## 2020-12-07 ENCOUNTER — Other Ambulatory Visit: Payer: Self-pay | Admitting: Hematology and Oncology

## 2020-12-07 ENCOUNTER — Telehealth: Payer: Self-pay | Admitting: *Deleted

## 2020-12-07 ENCOUNTER — Ambulatory Visit (HOSPITAL_BASED_OUTPATIENT_CLINIC_OR_DEPARTMENT_OTHER)
Admission: RE | Admit: 2020-12-07 | Discharge: 2020-12-07 | Disposition: A | Discharge status: Home / Self Care | Payer: BC Managed Care – PPO | Source: Ambulatory Visit | Attending: Hematology and Oncology | Admitting: Hematology and Oncology

## 2020-12-07 DIAGNOSIS — R634 Abnormal weight loss: Secondary | ICD-10-CM | POA: Insufficient documentation

## 2020-12-07 DIAGNOSIS — C029 Malignant neoplasm of tongue, unspecified: Secondary | ICD-10-CM | POA: Diagnosis present

## 2020-12-07 NOTE — Telephone Encounter (Signed)
Scheduled for CT at [facility] today at 2pm. Only had liquids today, last at 9am.  Contacted patient/wife with time of appt and advised nothing more to eat or drink today. She verbalized understanding of all information.

## 2020-12-08 ENCOUNTER — Telehealth: Payer: Self-pay | Admitting: Internal Medicine

## 2020-12-08 ENCOUNTER — Inpatient Hospital Stay: Payer: BC Managed Care – PPO

## 2020-12-08 ENCOUNTER — Other Ambulatory Visit: Payer: Self-pay

## 2020-12-08 ENCOUNTER — Inpatient Hospital Stay: Payer: BC Managed Care – PPO | Admitting: Hematology and Oncology

## 2020-12-08 ENCOUNTER — Inpatient Hospital Stay: Payer: BC Managed Care – PPO | Admitting: Dietician

## 2020-12-08 VITALS — BP 144/75 | HR 95 | Temp 97.9°F | Resp 18 | Wt 177.5 lb

## 2020-12-08 DIAGNOSIS — C029 Malignant neoplasm of tongue, unspecified: Secondary | ICD-10-CM

## 2020-12-08 DIAGNOSIS — C7801 Secondary malignant neoplasm of right lung: Secondary | ICD-10-CM

## 2020-12-08 DIAGNOSIS — Z5112 Encounter for antineoplastic immunotherapy: Secondary | ICD-10-CM | POA: Diagnosis not present

## 2020-12-08 MED ORDER — SODIUM CHLORIDE 0.9% FLUSH
10.0000 mL | INTRAVENOUS | Status: DC | PRN
  Administered 2020-12-08: 10 mL

## 2020-12-08 MED ORDER — METHYLPREDNISOLONE SODIUM SUCC 125 MG IJ SOLR
125.0000 mg | Freq: Once | INTRAMUSCULAR | Status: AC
  Administered 2020-12-08: 125 mg via INTRAVENOUS
  Filled 2020-12-08: qty 2

## 2020-12-08 MED ORDER — DIPHENHYDRAMINE HCL 50 MG/ML IJ SOLN
25.0000 mg | Freq: Once | INTRAMUSCULAR | Status: AC
  Administered 2020-12-08: 25 mg via INTRAVENOUS
  Filled 2020-12-08: qty 1

## 2020-12-08 MED ORDER — SODIUM CHLORIDE 0.9 % IV SOLN: Freq: Once | INTRAVENOUS | Status: AC

## 2020-12-08 MED ORDER — HEPARIN SOD (PORK) LOCK FLUSH 100 UNIT/ML IV SOLN
500.0000 [IU] | Freq: Once | INTRAVENOUS | Status: AC | PRN
  Administered 2020-12-08: 500 [IU]

## 2020-12-08 MED ORDER — OSMOLITE 1.5 CAL PO LIQD: ORAL | 0 refills | Status: AC

## 2020-12-08 MED ORDER — EMPTY CONTAINERS FLEXIBLE MISC
150.0000 mg/m2 | Freq: Once | Status: AC
  Administered 2020-12-08: 300 mg via INTRAVENOUS
  Filled 2020-12-08: qty 100

## 2020-12-08 MED ORDER — FAMOTIDINE 20 MG IN NS 100 ML IVPB
20.0000 mg | Freq: Once | INTRAVENOUS | Status: AC
  Administered 2020-12-08: 20 mg via INTRAVENOUS
  Filled 2020-12-08: qty 100

## 2020-12-08 NOTE — Patient Instructions (Signed)
[facility] ONCOLOGY  Discharge Instructions: Thank you for choosing [facility] to provide your oncology and hematology care.   If you have a lab appointment with the [facility], please go directly to the [facility] and check in at the registration area.   Wear comfortable clothing and clothing appropriate for easy access to any Portacath or PICC line.   We strive to give you quality time with your provider. You may need to reschedule your appointment if you arrive late (15 or more minutes).  Arriving late affects you and other patients whose appointments are after yours.  Also, if you miss three or more appointments without notifying the office, you may be dismissed from the clinic at the provider's discretion.      For prescription refill requests, have your pharmacy contact our office and allow 72 hours for refills to be completed.    Today you received the following chemotherapy and/or immunotherapy agents: Erbitux   To help prevent nausea and vomiting after your treatment, we encourage you to take your nausea medication as directed.  BELOW ARE SYMPTOMS THAT SHOULD BE REPORTED IMMEDIATELY: *FEVER GREATER THAN 100.4 F (38 C) OR HIGHER *CHILLS OR SWEATING *NAUSEA AND VOMITING THAT IS NOT CONTROLLED WITH YOUR NAUSEA MEDICATION *UNUSUAL SHORTNESS OF BREATH *UNUSUAL BRUISING OR BLEEDING *URINARY PROBLEMS (pain or burning when urinating, or frequent urination) *BOWEL PROBLEMS (unusual diarrhea, constipation, pain near the anus) TENDERNESS IN MOUTH AND THROAT WITH OR WITHOUT PRESENCE OF ULCERS (sore throat, sores in mouth, or a toothache) UNUSUAL RASH, SWELLING OR PAIN  UNUSUAL VAGINAL DISCHARGE OR ITCHING   Items with * indicate a potential emergency and should be followed up as soon as possible or go to the Emergency Department if any problems should occur.  Please show the CHEMOTHERAPY ALERT CARD or IMMUNOTHERAPY ALERT CARD at check-in to the  Emergency Department and triage nurse.  Should you have questions after your visit or need to cancel or reschedule your appointment, please contact [facility]  Dept: [phone]  and follow the prompts.  Office hours are 8:00 a.m. to 4:30 p.m. Monday - Friday. Please note that voicemails left after 4:00 p.m. may not be returned until the following business day.  We are closed weekends and major holidays. You have access to a nurse at all times for urgent questions. Please call the main number to the clinic Dept: [phone] and follow the prompts.   For any non-urgent questions, you may also contact your provider using MyChart. We now offer e-Visits for anyone 24 and older to request care online for non-urgent symptoms. For details visit [email or web address].   Also download the MyChart app! Go to the app store, search "MyChart", open the app, select [facility], and log in with your MyChart username and password.  Due to Covid, a mask is required upon entering the hospital/clinic. If you do not have a mask, one will be given to you upon arrival. For doctor visits, patients may have 1 support person aged 102 or older with them. For treatment visits, patients cannot have anyone with them due to current Covid guidelines and our immunocompromised population.

## 2020-12-08 NOTE — Progress Notes (Signed)
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 S/p partial glossectomy in 2013, concurrent chemoradiation completed in 2014.   Met with patient during infusion. He reports feeling good today after having a rough week requiring IV fluids. Patient reports weakness, unable to tolerate oral intake due to nausea with vomiting lasting 4 days after treatment, reports he had a fall in infusion a few days ago while receiving fluids. Patient reports he did not want to have another feeding tube placed, but knows that he needs to in order to maintain hydration and weights. Patient previously used Osmolite 1.5 formula, reports he tolerated this well. Patient reports he had a 24 oz shake this morning before treatment, recalls Boost VHC mixed with Ensure, whole milk, chocolate syrup. Patient reports this shake has 800 kcal and 70 grams protein).   Medications: Cleocin, Diflucan  Labs: 11/9 - Na 131, Glucose 149, K 3.6 (WNL), Mg 1.9 (WNL)  Anthropometrics: Weight continues to trend down. Weight 177 lb 8 oz today decreased 5.5 lbs (3%) in one week. His weights have decreased 11.5 lbs (6%) in the last 4 weeks. This is severe.   11/4 - 183 lb 11/2 - 185 lb 10/28 - 188 lb 12.8 oz 10/21 - 176 lb 12 oz 10/14 - 187 lb 4 oz 10/3 - 189 lb  Estimated Energy Needs  Kcals: 2415-2650 Protein: 129-145 Fluid: 2.5 L   MALNUTRITION DIAGNOSIS: Severe malnutrition continues   INTERVENTION:  G-tube placement scheduled 11/15 - [facility] IR Reviewed bolus feeding and PEG care with patient - handout provided PEG kit and one complimentary case of Osmolite 1.5 given today Instructed patient on titrated regimen - written instructions also provided Encouraged oral intake of high calorie, high protein shakes as tolerated Take nausea medication has prescribed per MD Will plan to complete tube feeding with patient during infusion at next follow-up Will  contact [facility] for delivery of formula and supplies Patient has contact information  Patient will start with giving 1/2 carton Osmolite 1.5 QID. Increase by 1/2 carton/day to goal of 7 cartons/day.   7 cartons Osmolite 1.5 split over four feedings/day. Flush tube with 180 ml water QID. Drink by mouth or give via tube additional 2 1/2 cups fluid daily. This provides  2485 kcal, 104.3 g protein, 2581 ml total water.   MONITORING, EVALUATION, GOAL: weight trends, intake, tube feeding   NEXT VISIT: Friday November 18 during infusion

## 2020-12-08 NOTE — Progress Notes (Signed)
Patient's wife notified that she can pick up PO contrast at [facility] in radiology for pt to drink the night of 12/11/20 for his 12/12/20 G-tube procedure. [facility] flouro dept is aware that pt will pick up there.  Pts wife also verbalized understanding that pt is to be NPO after midnight prior to G-tube placement.   Narda Rutherford, AGNP-BC 12/08/2020, 11:31 AM

## 2020-12-11 ENCOUNTER — Other Ambulatory Visit: Payer: Self-pay | Admitting: Radiology

## 2020-12-12 ENCOUNTER — Ambulatory Visit (HOSPITAL_COMMUNITY)
Admission: RE | Admit: 2020-12-12 | Discharge: 2020-12-12 | Disposition: A | Discharge status: Home / Self Care | Payer: BC Managed Care – PPO | Source: Ambulatory Visit | Attending: Hematology and Oncology | Admitting: Hematology and Oncology

## 2020-12-12 ENCOUNTER — Other Ambulatory Visit: Payer: Self-pay | Admitting: Hematology and Oncology

## 2020-12-12 ENCOUNTER — Other Ambulatory Visit: Payer: Self-pay

## 2020-12-12 ENCOUNTER — Encounter (HOSPITAL_COMMUNITY): Payer: Self-pay

## 2020-12-12 DIAGNOSIS — C029 Malignant neoplasm of tongue, unspecified: Secondary | ICD-10-CM | POA: Insufficient documentation

## 2020-12-12 DIAGNOSIS — C7800 Secondary malignant neoplasm of unspecified lung: Secondary | ICD-10-CM | POA: Diagnosis not present

## 2020-12-12 DIAGNOSIS — K1231 Oral mucositis (ulcerative) due to antineoplastic therapy: Secondary | ICD-10-CM | POA: Insufficient documentation

## 2020-12-12 HISTORY — PX: IR GASTROSTOMY TUBE MOD SED: IMG625

## 2020-12-12 HISTORY — PX: IR CT SPINE LTD: IMG2387

## 2020-12-12 LAB — CBC
HCT: 35.2 % — ABNORMAL LOW (ref 39.0–52.0)
Hemoglobin: 10.8 g/dL — ABNORMAL LOW (ref 13.0–17.0)
MCH: 27.6 pg (ref 26.0–34.0)
MCHC: 30.7 g/dL (ref 30.0–36.0)
MCV: 89.8 fL (ref 80.0–100.0)
Platelets: 293 10*3/uL (ref 150–400)
RBC: 3.92 MIL/uL — ABNORMAL LOW (ref 4.22–5.81)
RDW: 16.7 % — ABNORMAL HIGH (ref 11.5–15.5)
WBC: 19.4 10*3/uL — ABNORMAL HIGH (ref 4.0–10.5)
nRBC: 0.1 % (ref 0.0–0.2)

## 2020-12-12 LAB — PROTIME-INR
INR: 1 (ref 0.8–1.2)
Prothrombin Time: 12.7 seconds (ref 11.4–15.2)

## 2020-12-12 MED ORDER — MIDAZOLAM HCL 2 MG/2ML IJ SOLN
INTRAMUSCULAR | Status: AC
  Filled 2020-12-12: qty 6

## 2020-12-12 MED ORDER — LIDOCAINE VISCOUS HCL 2 % MT SOLN
OROMUCOSAL | Status: AC
  Filled 2020-12-12: qty 15

## 2020-12-12 MED ORDER — CEFAZOLIN SODIUM-DEXTROSE 2-4 GM/100ML-% IV SOLN
INTRAVENOUS | Status: AC
  Filled 2020-12-12: qty 100

## 2020-12-12 MED ORDER — FENTANYL CITRATE (PF) 100 MCG/2ML IJ SOLN
INTRAMUSCULAR | Status: AC | PRN
  Administered 2020-12-12 (×3): 50 ug via INTRAVENOUS

## 2020-12-12 MED ORDER — LIDOCAINE HCL 1 % IJ SOLN: INTRAMUSCULAR | Status: AC | PRN

## 2020-12-12 MED ORDER — SODIUM CHLORIDE 0.9 % IV SOLN: INTRAVENOUS | Status: DC

## 2020-12-12 MED ORDER — LIDOCAINE-EPINEPHRINE 1 %-1:100000 IJ SOLN
INTRAMUSCULAR | Status: AC
  Filled 2020-12-12: qty 1

## 2020-12-12 MED ORDER — CEFAZOLIN SODIUM-DEXTROSE 2-4 GM/100ML-% IV SOLN
2.0000 g | INTRAVENOUS | Status: AC
  Administered 2020-12-12: 2 g via INTRAVENOUS

## 2020-12-12 MED ORDER — MIDAZOLAM HCL 2 MG/2ML IJ SOLN
INTRAMUSCULAR | Status: AC | PRN
  Administered 2020-12-12 (×4): 1 mg via INTRAVENOUS

## 2020-12-12 MED ORDER — LIDOCAINE-EPINEPHRINE 1 %-1:100000 IJ SOLN
INTRAMUSCULAR | Status: AC | PRN
  Administered 2020-12-12: 20 mL

## 2020-12-12 MED ORDER — IOHEXOL 300 MG/ML  SOLN
100.0000 mL | Freq: Once | INTRAMUSCULAR | Status: AC | PRN
  Administered 2020-12-12: 35 mL

## 2020-12-12 MED ORDER — FENTANYL CITRATE (PF) 100 MCG/2ML IJ SOLN
INTRAMUSCULAR | Status: AC
  Filled 2020-12-12: qty 4

## 2020-12-12 MED ORDER — STERILE WATER FOR INJECTION IJ SOLN
INTRAMUSCULAR | Status: AC
  Filled 2020-12-12: qty 10

## 2020-12-12 NOTE — Procedures (Signed)
Interventional Radiology Procedure Note ? ?Procedure: Placement of percutaneous 20F balloon retention gastrostomy tube. ? ?Complications: None ? ?Recommendations: ?- NPO except for sips and chips remainder of today and overnight ?- May advance diet as tolerated and begin using tube tomorrow morning ? ? ?Randy Whitsitt, MD ?Pager: [phone] ? ? ? ?

## 2020-12-12 NOTE — H&P (Addendum)
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 male  Tongue cancer 2013 Treatment ended 2014 Perc G tube placed in IR 03/2012 Did well with tube Removed 08/2012  Recurrent cancer 05/24/20:  Pathology Results     A. SUBMENTAL MASS, MIDLINE, NEEDLE CORE BIOPSY:  - Poorly differentiated sarcomatoid malignancy.     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 Wt loss; dysphagia Severe malnutrition Dr Alvy Bimler request percutaneous gastric tube placement    Past Medical History:  Diagnosis Date   Bradycardia 03/16/2014   Dry mouth    radiation   GERD (gastroesophageal reflux disease)    occ   Gout    Head and neck cancer ([place])    tongue cancer   High triglycerides    History of kidney stones    History of radiation therapy 06/02/12- 07/14/12   oral tongue and bilateral neck, 60 gray in 30 fx   Hypothyroidism    radiation- not working   PONV (postoperative nausea and vomiting)    tongue swelled-emergency trach   Ringing in ears    Staph skin infection    Elbow- resolved.     Stones in the urinary tract    Tongue cancer ([place])    Xerostomia 11/30/2012    Past Surgical History:  Procedure Laterality Date   COLONOSCOPY N/A 06/16/2019   Procedure: COLONOSCOPY;  Surgeon: Daneil Dolin, MD;  Location: [facility];  Service: Endoscopy;  Laterality: N/A;  1:15pm   CYSTOSCOPY WITH RETROGRADE PYELOGRAM, URETEROSCOPY AND STENT PLACEMENT Left 04/16/2013   Procedure: CYSTOSCOPY WITH RETROGRADE PYELOGRAM, URETEROSCOPY AND STENT PLACEMENT;  Surgeon: Molli Hazard, MD;  Location: [facility];  Service: Urology;  Laterality: Left;   FISTULOTOMY N/A 08/05/2019   Procedure: ANAL EXAM UNDER ANESTHESIA, FISTULOTOMY;  Surgeon: Leighton Ruff,  MD;  Location: [facility];  Service: General;  Laterality: N/A;   GASTROSTOMY W/ FEEDING TUBE  2/14   GLOSSECTOMY     GLOSSECTOMY  08/22/2011   Procedure: GLOSSECTOMY;  Surgeon: [name], MD;  Location: [place];  Service: ENT;  Laterality: Right;  WITH FROZEN SECTION   GLOSSECTOMY  09/06/2011   Procedure: GLOSSECTOMY;  Surgeon: [name], MD;  Location: [place];  Service: ENT;  Laterality: Right;  Right Tongue Re-Excision   HEMIGLOSSECTOMY N/A 03/23/2012   Procedure: BIOPSY OF TONGUE WITH FROZEN SECTION AND RIGHT HEMIGLOSSECTOMY;  Surgeon: [name], MD;  Location: [place];  Service: ENT;  Laterality: N/A;   HOLMIUM LASER APPLICATION Left 07/25/3149   Procedure: HOLMIUM LASER APPLICATION;  Surgeon: Molli Hazard, MD;  Location: [facility];  Service: Urology;  Laterality: Left;   IR IMAGING GUIDED PORT INSERTION  06/15/2020   MOUTH SURGERY     biopsy- (08/06/2011 Dr. Hoyt Koch), also had tissue graft (oral) 2010   RADICAL NECK DISSECTION  09/06/2011   Procedure: RADICAL NECK DISSECTION;  Surgeon: [name], MD;  Location: [place];  Service: ENT;  Laterality: Right;  Right Modified Neck Dissection    RADICAL NECK DISSECTION Left 04/23/2012   Procedure: LEFT MODIFIED  NECK DISSECTION;  Surgeon: [name], MD;  Location: [place];  Service: ENT;  Laterality: Left;  Left Supra Omo Hyoid Neck Dissection    TONGUE BIOPSY     TRACHEOSTOMY CLOSURE  [place] N/A 03/23/2012   Procedure: TRACHEOSTOMY;  Surgeon: [name], MD;  Location: [place];  Service: ENT;  Laterality: N/A;    Allergies: Cetuximab  Medications: Prior to Admission medications   Medication Sig Start Date End Date Taking? Authorizing Provider  clindamycin (CLEOCIN) 300 MG capsule Take 1 capsule (300 mg total) by mouth 3 (three) times daily. 12/06/20  Yes Gorsuch, Ni, MD  morphine (MS CONTIN) 60 MG 12 hr tablet Take 1 tablet (60 mg total) by mouth in the morning, at noon, and at bedtime. Patient taking differently:  Take 60 mg by mouth 3 (three) times daily as needed for pain. 11/09/20  Yes Gorsuch, Ni, MD  morphine (MSIR) 30 MG tablet Take 1 tablet (30 mg total) by mouth every 4 (four) hours as needed for severe pain. 11/09/20  Yes Gorsuch, Ni, MD  polyethylene glycol (MIRALAX / GLYCOLAX) 17 g packet Take 17 g by mouth daily as needed (constipation).   Yes [provider]  senna (SENOKOT) 8.6 MG tablet Take 2 tablets by mouth 2 (two) times daily.   Yes [provider]  fluconazole (DIFLUCAN) 100 MG tablet Take 1 tablet (100 mg total) by mouth daily. Patient not taking: No sig reported 11/29/20   Heath Lark, MD  loratadine (CLARITIN) 10 MG tablet Take 10 mg by mouth daily as needed for allergies.    [provider]  magic mouthwash (nystatin, lidocaine, diphenhydrAMINE, alum & mag hydroxide) suspension Swish and spit 5 mls 4 times a day as needed Patient taking differently: 5 mLs 4 (four) times daily as needed for mouth pain. 11/17/20   Heath Lark, MD  Nutritional Supplements (FEEDING SUPPLEMENT, OSMOLITE 1.5 CAL,) LIQD 7 cartons Osmolite 1.5 split over four feedings/day. Flush tube with 180 ml water QID. Drink by mouth or give via tube additional 2 1/2 cups fluid daily. This provides  2485 kcal, 104.3 g protein, 2581 ml total water. 12/08/20   Heath Lark, MD  predniSONE (DELTASONE) 5 MG tablet Take 1 tablet (5 mg total) by mouth daily with breakfast. Patient taking differently: Take 5 mg by mouth daily as needed (swelling, inflammation). 10/05/20   Heath Lark, MD  prochlorperazine (COMPAZINE) 10 MG tablet Take 1 tablet (10 mg total) by mouth every 6 (six) hours as needed for nausea or vomiting. 08/31/20   Heath Lark, MD     Family History  Problem Relation Age of Onset   Rheum arthritis Mother    Asthma Father    Cancer Maternal Grandmother    Diabetes Sister    Diabetes Brother    Colon cancer Neg Hx     Social History   Socioeconomic History   Marital status: Married     Spouse name: Not on file   Number of children: 3   Years of education: Not on file   Highest education level: Not on file  Occupational History   Occupation: Music therapist: [employer]    Comment: AC unit.   Tobacco Use   Smoking status: Former    Types: Cigars    Quit date: 08/14/2011    Years since quitting: 9.3   Smokeless tobacco: Never  Vaping Use   Vaping Use: Never used  Substance and Sexual Activity   Alcohol use: No    Comment: quit drinking 7/13 from 12 pack of beer on weekends   Drug use: No   Sexual activity: Yes  Other Topics Concern   Not on  file  Social History Narrative   Married.   History of smoking 5 small cigars daily but quit in 2013.   History of drinking approximately a 12 pack of beer on weekends but quit in July 2013.         Social Determinants of Health   Financial Resource Strain: Not on file  Food Insecurity: Not on file  Transportation Needs: Not on file  Physical Activity: Not on file  Stress: Not on file  Social Connections: Not on file    Review of Systems: A 12 point ROS discussed and pertinent positives are indicated in the HPI above.  All other systems are negative.  Review of Systems  Constitutional:  Positive for activity change, appetite change and unexpected weight change. Negative for fatigue and fever.  Respiratory:  Negative for cough and shortness of breath.   Cardiovascular:  Negative for chest pain.  Gastrointestinal:  Negative for abdominal pain.  Musculoskeletal:  Negative for back pain and gait problem.  Neurological:  Negative for weakness.  Psychiatric/Behavioral:  Negative for behavioral problems and confusion.    Vital Signs: BP 106/80   Pulse 99   Temp 98.3 F (36.8 C) (Oral)   Ht 6' (1.829 m)   Wt 175 lb (79.4 kg)   SpO2 100%   BMI 23.73 kg/m   Physical Exam Vitals reviewed.  HENT:     Mouth/Throat:     Mouth: Mucous membranes are moist.     Comments: Unable to open mouth wide Has  overflow mucus from mouth--- Cardiovascular:     Rate and Rhythm: Normal rate and regular rhythm.     Heart sounds: Normal heart sounds.  Pulmonary:     Effort: Pulmonary effort is normal.     Breath sounds: Normal breath sounds.  Abdominal:     Palpations: Abdomen is soft.  Musculoskeletal:        General: Normal range of motion.  Skin:    General: Skin is warm.  Neurological:     Mental Status: He is alert and oriented to person, place, and time.  Psychiatric:        Behavior: Behavior normal.    Imaging: CT Abdomen Wo Contrast  Result Date: 12/07/2020 CLINICAL DATA:  Declining health Struggling to maintain oral intake with progressive weight loss CT obtained for evaluation for possible feeding tube placement EXAM: CT ABDOMEN WITHOUT CONTRAST TECHNIQUE: Multidetector CT imaging of the abdomen was performed following the standard protocol without IV contrast. COMPARISON:  PET-CT 08/18/2020 FINDINGS: Lower chest: 8 x 5 mm nodule in the right middle lobe is not significantly changed since prior PET-CT. Visualized lung bases are otherwise clear. Hepatobiliary: No focal liver abnormality is seen. No gallstones, gallbladder wall thickening, or biliary dilatation. Pancreas: Unremarkable. No pancreatic ductal dilatation or surrounding inflammatory changes. Spleen: Normal in size without focal abnormality. Adrenals/Urinary Tract: Adrenal glands are normal in appearance. Multiple bilateral nonobstructing renal calculi with the largest measuring 8 mm, located on the right. Stomach/Bowel: No dilated loops of bowel within the visualized abdomen. Transfers colon is in close proximity to the stomach. Vascular/Lymphatic: No enlarged retroperitoneal or mesenteric lymph nodes. Other: No abdominal wall hernia or abnormality. Musculoskeletal: No acute or significant osseous findings. IMPRESSION: 1. Short segment of transverse colon is in close proximity to the stomach. It is possible that this segment can be  displaced by inflating the stomach, creating a safe percutaneous window for G-tube placement. The patient should be given oral contrast the night before gastrostomy tube  is attempted by IR. 2. Nonobstructing bilateral renal calculi. Electronically Signed   By: Miachel Roux M.D.   On: 12/07/2020 16:09    Labs:  CBC: Recent Labs    11/24/20 1013 11/29/20 0748 12/01/20 1021 12/06/20 0906  WBC 8.8 11.4* 8.8 4.0  HGB 9.6* 10.8* 10.4* 11.3*  HCT 30.2* 33.0* 32.7* 33.9*  PLT 311 462* 392 445*    COAGS: No results for input(s): INR, APTT in the last 8760 hours.  BMP: Recent Labs    11/24/20 1013 11/29/20 0748 12/01/20 1021 12/06/20 0906  NA 139 137 136 131*  K 4.0 3.9 3.6 3.6  CL 102 99 101 97*  CO2 27 27 23 23   GLUCOSE 116* 103* 98 149*  BUN 9 14 17 13   CALCIUM 9.5 9.1 8.8* 9.1  CREATININE 0.64 0.61 0.67 0.66  GFRNONAA >60 >60 >60 >60    LIVER FUNCTION TESTS: Recent Labs    11/24/20 1013 11/29/20 0748 12/01/20 1021 12/06/20 0906  BILITOT 0.3 0.3 0.3 0.6  AST 12* 13* 25 22  ALT 14 16 24  33  ALKPHOS 100 114 94 103  PROT 6.8 7.2 6.8 7.2  ALBUMIN 3.1* 3.3* 3.2* 3.5    TUMOR MARKERS: No results for input(s): AFPTM, CEA, CA199, CHROMGRNA in the last 8760 hours.  Assessment and Plan:  Tongue cancer 2013 Partial glossotomy  Recurrence 04/2020 Wt loss; dysphagia; severe malnutrition Scheduled for percutaneous gastric tube placement Risks and benefits image guided gastrostomy tube placement was discussed with the patient including, but not limited to the need for a barium enema during the procedure, bleeding, infection, peritonitis and/or damage to adjacent structures.  All of the patient's questions were answered, patient is agreeable to proceed. Consent signed and in chart.   Thank you for this interesting consult.  I greatly enjoyed meeting KASON BENAK and look forward to participating in their care.  A copy of this report was sent to the requesting provider on  this date.  Electronically Signed: Lavonia Drafts, PA-C 12/12/2020, 8:23 AM   I spent a total of  30 Minutes   in face to face in clinical consultation, greater than 50% of which was counseling/coordinating care for perc G tube placement

## 2020-12-15 ENCOUNTER — Ambulatory Visit (HOSPITAL_BASED_OUTPATIENT_CLINIC_OR_DEPARTMENT_OTHER): Payer: BC Managed Care – PPO | Admitting: Physician Assistant

## 2020-12-15 ENCOUNTER — Other Ambulatory Visit: Payer: Self-pay

## 2020-12-15 ENCOUNTER — Other Ambulatory Visit (HOSPITAL_COMMUNITY): Payer: Self-pay

## 2020-12-15 ENCOUNTER — Inpatient Hospital Stay: Payer: BC Managed Care – PPO | Admitting: Dietician

## 2020-12-15 ENCOUNTER — Inpatient Hospital Stay: Payer: BC Managed Care – PPO

## 2020-12-15 VITALS — BP 110/86 | HR 97 | Temp 98.3°F | Resp 17 | Wt 181.0 lb

## 2020-12-15 DIAGNOSIS — C029 Malignant neoplasm of tongue, unspecified: Secondary | ICD-10-CM | POA: Diagnosis not present

## 2020-12-15 DIAGNOSIS — Z5112 Encounter for antineoplastic immunotherapy: Secondary | ICD-10-CM | POA: Diagnosis not present

## 2020-12-15 DIAGNOSIS — C7802 Secondary malignant neoplasm of left lung: Secondary | ICD-10-CM

## 2020-12-15 LAB — CBC WITH DIFFERENTIAL (CANCER CENTER ONLY)
Abs Immature Granulocytes: 0.15 10*3/uL — ABNORMAL HIGH (ref 0.00–0.07)
Basophils Absolute: 0.1 10*3/uL (ref 0.0–0.1)
Basophils Relative: 1 %
Eosinophils Absolute: 0 10*3/uL (ref 0.0–0.5)
Eosinophils Relative: 0 %
HCT: 35.7 % — ABNORMAL LOW (ref 39.0–52.0)
Hemoglobin: 11.4 g/dL — ABNORMAL LOW (ref 13.0–17.0)
Immature Granulocytes: 1 %
Lymphocytes Relative: 2 %
Lymphs Abs: 0.3 10*3/uL — ABNORMAL LOW (ref 0.7–4.0)
MCH: 28.2 pg (ref 26.0–34.0)
MCHC: 31.9 g/dL (ref 30.0–36.0)
MCV: 88.4 fL (ref 80.0–100.0)
Monocytes Absolute: 0.8 10*3/uL (ref 0.1–1.0)
Monocytes Relative: 5 %
Neutro Abs: 13.9 10*3/uL — ABNORMAL HIGH (ref 1.7–7.7)
Neutrophils Relative %: 91 %
Platelet Count: 266 10*3/uL (ref 150–400)
RBC: 4.04 MIL/uL — ABNORMAL LOW (ref 4.22–5.81)
RDW: 16.4 % — ABNORMAL HIGH (ref 11.5–15.5)
WBC Count: 15.3 10*3/uL — ABNORMAL HIGH (ref 4.0–10.5)
nRBC: 0 % (ref 0.0–0.2)

## 2020-12-15 LAB — MAGNESIUM: Magnesium: 2 mg/dL (ref 1.7–2.4)

## 2020-12-15 LAB — CMP (CANCER CENTER ONLY)
ALT: 14 U/L (ref 0–44)
AST: 14 U/L — ABNORMAL LOW (ref 15–41)
Albumin: 3.6 g/dL (ref 3.5–5.0)
Alkaline Phosphatase: 133 U/L — ABNORMAL HIGH (ref 38–126)
Anion gap: 11 (ref 5–15)
BUN: 15 mg/dL (ref 6–20)
CO2: 28 mmol/L (ref 22–32)
Calcium: 9.4 mg/dL (ref 8.9–10.3)
Chloride: 99 mmol/L (ref 98–111)
Creatinine: 0.67 mg/dL (ref 0.61–1.24)
GFR, Estimated: >60 mL/min (ref >60)
Glucose, Bld: 107 mg/dL — ABNORMAL HIGH (ref 70–99)
Potassium: 4.2 mmol/L (ref 3.5–5.1)
Sodium: 138 mmol/L (ref 135–145)
Total Bilirubin: <0.2 mg/dL — ABNORMAL LOW (ref 0.3–1.2)
Total Protein: 7.2 g/dL (ref 6.5–8.1)

## 2020-12-15 MED ORDER — FAMOTIDINE 20 MG IN NS 100 ML IVPB
20.0000 mg | Freq: Once | INTRAVENOUS | Status: AC
  Administered 2020-12-15: 20 mg via INTRAVENOUS
  Filled 2020-12-15: qty 100

## 2020-12-15 MED ORDER — SODIUM CHLORIDE 0.9 % IV SOLN: Freq: Once | INTRAVENOUS | Status: AC

## 2020-12-15 MED ORDER — SULFAMETHOXAZOLE-TRIMETHOPRIM 800-160 MG PO TABS
1.0000 | ORAL_TABLET | Freq: Two times a day (BID) | ORAL | 0 refills | Status: DC
Start: 2020-12-15 — End: 2020-12-29
  Filled 2020-12-15: qty 28, 14d supply, fill #0

## 2020-12-15 MED ORDER — EMPTY CONTAINERS FLEXIBLE MISC
150.0000 mg/m2 | Freq: Once | Status: AC
  Administered 2020-12-15: 300 mg via INTRAVENOUS
  Filled 2020-12-15: qty 50

## 2020-12-15 MED ORDER — NYSTATIN 100000 UNIT/ML MT SUSP
5.0000 mL | Freq: Four times a day (QID) | OROMUCOSAL | 1 refills | Status: DC | PRN
  Filled 2020-12-15: qty 240, 12d supply, fill #0

## 2020-12-15 MED ORDER — SODIUM CHLORIDE 0.9% FLUSH: 10.0000 mL | Freq: Once | INTRAVENOUS | Status: DC

## 2020-12-15 MED ORDER — METHYLPREDNISOLONE SODIUM SUCC 125 MG IJ SOLR
125.0000 mg | Freq: Once | INTRAMUSCULAR | Status: AC
  Administered 2020-12-15: 125 mg via INTRAVENOUS
  Filled 2020-12-15: qty 2

## 2020-12-15 MED ORDER — HEPARIN SOD (PORK) LOCK FLUSH 100 UNIT/ML IV SOLN
500.0000 [IU] | Freq: Once | INTRAVENOUS | Status: AC | PRN
  Administered 2020-12-15: 500 [IU]

## 2020-12-15 MED ORDER — SODIUM CHLORIDE 0.9% FLUSH
10.0000 mL | INTRAVENOUS | Status: DC | PRN
  Administered 2020-12-15: 10 mL

## 2020-12-15 MED ORDER — DIPHENHYDRAMINE HCL 50 MG/ML IJ SOLN
25.0000 mg | Freq: Once | INTRAMUSCULAR | Status: AC
  Administered 2020-12-15: 25 mg via INTRAVENOUS
  Filled 2020-12-15: qty 1

## 2020-12-15 NOTE — Progress Notes (Signed)
Kaitlyn, PA assessed pt in infusion. OK to proceed with treatment today per PA.

## 2020-12-15 NOTE — Progress Notes (Signed)
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 S/p partial glossectomy in 2013, concurrent chemoradiation completed in 2014. G-tube placed 11/15  Met with patient during infusion. He reports mild abdominal soreness from G-tube placement. He reports tolerating high calorie, high protein shakes this week and has not needed to use feeding tube. Patient is flushing tube with 60 ml water daily. He is agreeable to completing tube feeding during infusion today.    Medications: reviewed  Labs: Glucose 107  Anthropometrics: Weight 181 lb today increased from 177 lb 8 oz on 11/11   11/4 - 183 lb 10/28 - 188 lb 12.8 oz  10/21 - 176 lb 12 oz  Estimated Energy Needs  Kcals: 2415-2650 Protein: 129-145 Fluid: 2.5 L  NUTRITION DIAGNOSIS: Severe malnutrition continues    INTERVENTION:  Continue oral intake of high calorie, high protein shakes as tolerated Patient agreeable to supplement with 1 carton Osmolite 1.5 daily in addition to oral intake to promote weight gain Patient understands he will increase tube feedings with decreased oral intake - patient has received titrated regimen instructions Patient successfully demonstrated bolus feeding and water flush during infusion  7 cartons Osmolite 1.5 split over four feedings/day. Flush tube with 180 ml water QID. Drink by mouth or give via tube additional 2 1/2 cups fluid daily. This provides  2485 kcal, 104.3 g protein, 2581 ml total water.    MONITORING, EVALUATION, GOAL: weight trends, intake, tube feeding   NEXT VISIT: Friday, November 25 during infusion

## 2020-12-15 NOTE — Patient Instructions (Signed)
[facility] ONCOLOGY  Discharge Instructions: Thank you for choosing [facility] to provide your oncology and hematology care.   If you have a lab appointment with the [facility], please go directly to the [facility] and check in at the registration area.   Wear comfortable clothing and clothing appropriate for easy access to any Portacath or PICC line.   We strive to give you quality time with your provider. You may need to reschedule your appointment if you arrive late (15 or more minutes).  Arriving late affects you and other patients whose appointments are after yours.  Also, if you miss three or more appointments without notifying the office, you may be dismissed from the clinic at the provider's discretion.      For prescription refill requests, have your pharmacy contact our office and allow 72 hours for refills to be completed.    Today you received the following chemotherapy and/or immunotherapy agents: Erbitux   To help prevent nausea and vomiting after your treatment, we encourage you to take your nausea medication as directed.  BELOW ARE SYMPTOMS THAT SHOULD BE REPORTED IMMEDIATELY: *FEVER GREATER THAN 100.4 F (38 C) OR HIGHER *CHILLS OR SWEATING *NAUSEA AND VOMITING THAT IS NOT CONTROLLED WITH YOUR NAUSEA MEDICATION *UNUSUAL SHORTNESS OF BREATH *UNUSUAL BRUISING OR BLEEDING *URINARY PROBLEMS (pain or burning when urinating, or frequent urination) *BOWEL PROBLEMS (unusual diarrhea, constipation, pain near the anus) TENDERNESS IN MOUTH AND THROAT WITH OR WITHOUT PRESENCE OF ULCERS (sore throat, sores in mouth, or a toothache) UNUSUAL RASH, SWELLING OR PAIN  UNUSUAL VAGINAL DISCHARGE OR ITCHING   Items with * indicate a potential emergency and should be followed up as soon as possible or go to the Emergency Department if any problems should occur.  Please show the CHEMOTHERAPY ALERT CARD or IMMUNOTHERAPY ALERT CARD at check-in to the  Emergency Department and triage nurse.  Should you have questions after your visit or need to cancel or reschedule your appointment, please contact [facility]  Dept: [phone]  and follow the prompts.  Office hours are 8:00 a.m. to 4:30 p.m. Monday - Friday. Please note that voicemails left after 4:00 p.m. may not be returned until the following business day.  We are closed weekends and major holidays. You have access to a nurse at all times for urgent questions. Please call the main number to the clinic Dept: [phone] and follow the prompts.   For any non-urgent questions, you may also contact your provider using MyChart. We now offer e-Visits for anyone 24 and older to request care online for non-urgent symptoms. For details visit [email or web address].   Also download the MyChart app! Go to the app store, search "MyChart", open the app, select [facility], and log in with your MyChart username and password.  Due to Covid, a mask is required upon entering the hospital/clinic. If you do not have a mask, one will be given to you upon arrival. For doctor visits, patients may have 1 support person aged 102 or older with them. For treatment visits, patients cannot have anyone with them due to current Covid guidelines and our immunocompromised population.

## 2020-12-15 NOTE — Progress Notes (Signed)
Pt has developed an open draining, wound on his left lower jaw.  Pt states "It opened a few days ago and is a straight hole to my mouth." Pt also claims that whenever he drinks anything it comes right back out of the hole.  RN notified Dr. Lindi Adie and [place], PA (symptom management) Verline Lema, [place] will see him in infusion today.

## 2020-12-15 NOTE — Progress Notes (Signed)
Symptom Management Consult note [place]    Patient Care Team: Redmond School, MD as PCP - General (Internal Medicine) [name], MD as Attending Physician (Otolaryngology) Gery Pray, MD as Attending Physician (Radiation Oncology) Heath Lark, MD as Consulting Physician (Hematology and Oncology) Daneil Dolin, MD as Consulting Physician (Gastroenterology)    Name of the patient: [name]  [id number]  08-Mar-1980   Date of visit: 12/15/2020    Chief complaint/ Reason for visit- wound  Oncology History  Tongue cancer ([place])  08/22/2011 Surgery   He had partial glossectomy which showed invasive SCC measured 1.3 cm   09/06/2011 Surgery   He underwent right neck dissection which showed 1/24 LN involved and repeat resection of tongue was negative   03/23/2012 Surgery   Repeat tngue resection showed well differentiated SCC 1.5 cm which invaded into the muscle   04/17/2012 Imaging   PET/CT scan showed New adenopathy in the left neck, compatible with recurrent malignancy   04/23/2012 Surgery   he underwent left neck LN dissection and 2/34 LN were positve   06/01/2012 - 07/13/2012 Chemotherapy   Patient had 3 cycles of cisplatin   06/12/2012 - 07/14/2012 Radiation Therapy   Patient completed RT   12/10/2012 Imaging   Interval resolution of previous hypermetabolic cervical adenopathy. There is nonspecific asymmetric increased uptake within the right side of tongue   03/13/2013 Imaging   PET/CT scan showed no evidence of disease recurrence. He was noted to have incidental kidney stones   08/15/2014 Imaging   CT scan of the dissection no evidence of disease   05/02/2020 Imaging   CT scan of neck 1. 4.3 x 2.7 x 2.7 cm peripherally enhancing mass at the anterior tongue base with focal sclerosis of the anterior genu of the mandible. 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 2. 8 mm peripherally enhancing  left level 2 lymph node is concerning for metastatic disease. Recommend PET scan of the neck to further characterize disease. 3. No other significant recurrent left-sided lymph nodes. 4. Post radiation changes of the carotid sheath bilaterally. 5. Post radiation changes at the lung apices bilaterally. 6. Polyps or mucous retention cysts within the maxillary sinuses bilaterally.   05/24/2020 Pathology Results   A. SUBMENTAL MASS, MIDLINE, NEEDLE CORE BIOPSY:  - Poorly differentiated sarcomatoid malignancy.   COMMENT:   CK7 is positive.  CKAE1/AE3, CK8/18, CK20, p40, CK5/6, S-100, Melan-A and Desmin are negative.  SMA demonstrates nonspecific staining.  Ki-67 proliferation index is high.  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     05/24/2020 Procedure   Successful ultrasound submental mass 18 gauge core biopsy   06/07/2020 PET scan   IMPRESSION: 1. Unfortunately evidence of squamous cell carcinoma recurrence in the floor of the mouth. Broad lesion with intense peripheral metabolic activity in the anterior RIGHT floor the mouth. 2. Bilateral hypermetabolic nodules within the sub submandibular which are new from PET-CT 2015. Concern for unusual metastasis to the submandibular glands. 3. New small bilateral small pulmonary nodules with faint radiotracer activity. These are also concerning for metastasis. 4. Asymmetric hypermetabolic activity within the RIGHT testicle. Favor benign inflammation; however consider testicular ultrasound if concern for unusual pattern of metastasis.   06/15/2020 Procedure   Successful placement of a right internal jugular approach power injectable Port-A-Cath. The catheter is ready for immediate use.     06/19/2020 - 08/05/2020 Chemotherapy  08/21/2020 PET scan   1. Progressive tumor in the floor the mouth with overt permeative destructive changes involving the mandible. 2. Persistent  hypermetabolic neck nodes as detailed above. No new findings. 3. Progressive pulmonary metastatic disease. 4. No findings for abdominal/pelvic metastatic disease. 5. Persistent hypermetabolism in the right hemiscrotum most likely due to a varicocele.     08/29/2020 -  Chemotherapy   Patient is on Treatment Plan : HEAD/NECK Cisplatin + Docetaxel q21d plus cetuximab      08/31/2020 -  Chemotherapy   Patient is on Treatment Plan : HEAD/NECK Cetuximab q7d     10/27/2020 Imaging   1. Allowing for differing technique, no change in bilateral solid pulmonary nodules. 2. Several ground-glass nodules in the RIGHT upper lobe and mild tree-in-bud pattern in the RIGHT lower lobe could indicate mild pulmonary infection. 3. No lymphadenopathy.     10/27/2020 Imaging   Considerable enlargement of tumor at the anterior tongue and floor of the mouth measuring approximately 6 x 5 x 3 cm, with areas of intratumoral necrosis, particularly anterior.   Considerable progression of lytic destructive involvement of the mandible, more extensive on the right side than the left.   Enlargement of a low-density malignant level 2 node on the left, 12 mm today compared with 7 mm in April.   12/12/2020 Procedure   Technically successful placement of 20 Fr gastrostomy tube with absorbable gastropexy sutures   Metastasis to lung ([place])  06/09/2020 Initial Diagnosis   Metastasis to lung ([place])   08/31/2020 -  Chemotherapy   Patient is on Treatment Plan : HEAD/NECK Cetuximab q7d       Current Therapy: Cetuximab last treatment 12/08/20, [id number]-[id number] last received 12/06/20  Interval history- [name] is a 40 yo male with history as stated above seen in infusion center today for wound evaluation. Patient has recently been struggling with persistent drainage from necrotic tumor from his oropharynx. Oncologist Dr. Alvy Bimler last saw him 12/06/20 and prescribed a week of Clindamycin. Prior to that he had been on doxycyline  for wounds in the past he reports. He finished antibiotics 3 days ago. He states while on the antibiotics he was experiencing little to no drainage. For the last x 3 days he has had persistent drainage from the wound. It is constant, worse when talking, sneezing, or coughing. He admits to trying to "plug the hole" so he can drink fluids otherwise they will also drain through the wound. He reports he had a similar wound that had frequent drainage however it is currently scabbed over. No OTC treatments tried prior to arrival. He denies any fever, chills, bleeding, congestions, sinus pressure. He denies any sick contacts.      ROS  All other systems are reviewed and are negative for acute change except as noted in the HPI.    Allergies  Allergen Reactions   Cetuximab Hives    Hypersensitivity protocol utilized. Symptoms quickly resolved. Drug resumed and completed without further incident.      Past Medical History:  Diagnosis Date   Bradycardia 03/16/2014   Dry mouth    radiation   GERD (gastroesophageal reflux disease)    occ   Gout    Head and neck cancer (HCC)    tongue cancer   High triglycerides    History of kidney stones    History of radiation therapy 06/02/12- 07/14/12   oral tongue and bilateral neck, 60 gray in 30 fx   Hypothyroidism    radiation- not  working   PONV (postoperative nausea and vomiting)    tongue swelled-emergency trach   Ringing in ears    Staph skin infection    Elbow- resolved.     Stones in the urinary tract    Tongue cancer ([place])    Xerostomia 11/30/2012     Past Surgical History:  Procedure Laterality Date   COLONOSCOPY N/A 06/16/2019   Procedure: COLONOSCOPY;  Surgeon: Daneil Dolin, MD;  [facility];  Service: Endoscopy;  Laterality: N/A;  1:15pm   CYSTOSCOPY WITH RETROGRADE PYELOGRAM, URETEROSCOPY AND STENT PLACEMENT Left 04/16/2013   Procedure: CYSTOSCOPY WITH RETROGRADE PYELOGRAM, URETEROSCOPY AND STENT PLACEMENT;  Surgeon:  Molli Hazard, MD;  Location: [facility];  Service: Urology;  Laterality: Left;   FISTULOTOMY N/A 08/05/2019   Procedure: ANAL EXAM UNDER ANESTHESIA, FISTULOTOMY;  Surgeon: Leighton Ruff, MD;  Location: [facility];  Service: General;  Laterality: N/A;   GASTROSTOMY W/ FEEDING TUBE  2/14   GLOSSECTOMY     GLOSSECTOMY  08/22/2011   Procedure: GLOSSECTOMY;  Surgeon: [name], MD;  Location: [facility];  Service: ENT;  Laterality: Right;  WITH FROZEN SECTION   GLOSSECTOMY  09/06/2011   Procedure: GLOSSECTOMY;  Surgeon: [name], MD;  Location: [facility];  Service: ENT;  Laterality: Right;  Right Tongue Re-Excision   HEMIGLOSSECTOMY N/A 03/23/2012   Procedure: BIOPSY OF TONGUE WITH FROZEN SECTION AND RIGHT HEMIGLOSSECTOMY;  Surgeon: [name], MD;  Location: [facility];  Service: ENT;  Laterality: N/A;   HOLMIUM LASER APPLICATION Left 09/22/35   Procedure: HOLMIUM LASER APPLICATION;  Surgeon: Molli Hazard, MD;  Location[facility];  Service: Urology;  Laterality: Left;   IR CT SPINE LTD  12/12/2020   IR GASTROSTOMY TUBE MOD SED  12/12/2020   IR IMAGING GUIDED PORT INSERTION  06/15/2020   MOUTH SURGERY     biopsy- (08/06/2011 Dr. Hoyt Koch), also had tissue graft (oral) 2010   RADICAL NECK DISSECTION  09/06/2011   Procedure: RADICAL NECK DISSECTION;  Surgeon: [name], MD;  Location: [facility];  Service: ENT;  Laterality: Right;  Right Modified Neck Dissection    RADICAL NECK DISSECTION Left 04/23/2012   Procedure: LEFT MODIFIED  NECK DISSECTION;  Surgeon: [name], MD;  Location: [facility];  Service: ENT;  Laterality: Left;  Left Supra Omo Hyoid Neck Dissection    TONGUE BIOPSY     TRACHEOSTOMY CLOSURE     14   TRACHEOSTOMY TUBE PLACEMENT N/A 03/23/2012   Procedure: TRACHEOSTOMY;  Surgeon: [name], MD;  [facility];  Service: ENT;  Laterality: N/A;    Social History   Socioeconomic History   Marital status: Married    Spouse name: Not on file   Number of children: 3   Years of education: Not  on file   Highest education level: Not on file  Occupational History   Occupation: Music therapist: [employer]    Comment: AC unit.   Tobacco Use   Smoking status: Former    Types: Cigars    Quit date: 08/14/2011    Years since quitting: 9.3   Smokeless tobacco: Never  Vaping Use   Vaping Use: Never used  Substance and Sexual Activity   Alcohol use: No    Comment: quit drinking 7/13 from 12 pack of beer on weekends   Drug use: No   Sexual activity: Yes  Other Topics Concern   Not on file  Social History Narrative   Married.  History of smoking 5 small cigars daily but quit in 2013.   History of drinking approximately a 12 pack of beer on weekends but quit in July 2013.         Social Determinants of Health   Financial Resource Strain: Not on file  Food Insecurity: Not on file  Transportation Needs: Not on file  Physical Activity: Not on file  Stress: Not on file  Social Connections: Not on file  Intimate Partner Violence: Not on file    Family History  Problem Relation Age of Onset   Rheum arthritis Mother    Asthma Father    Cancer Maternal Grandmother    Diabetes Sister    Diabetes Brother    Colon cancer Neg Hx      Current Outpatient Medications:    sulfamethoxazole-trimethoprim (BACTRIM DS) 800-160 MG tablet, Take 1 tablet by mouth 2 (two) times daily for 14 days., Disp: 28 tablet, Rfl: 0   fluconazole (DIFLUCAN) 100 MG tablet, Take 1 tablet (100 mg total) by mouth daily. (Patient not taking: No sig reported), Disp: 7 tablet, Rfl: 0   loratadine (CLARITIN) 10 MG tablet, Take 10 mg by mouth daily as needed for allergies., Disp: , Rfl:    magic mouthwash (nystatin, lidocaine, diphenhydrAMINE, alum & mag hydroxide) suspension, Swish and spit 5 mls 4 times a day as needed, Disp: 240 mL, Rfl: 1   morphine (MS CONTIN) 60 MG 12 hr tablet, Take 1 tablet (60 mg total) by mouth in the morning, at noon, and at bedtime. (Patient taking differently: Take 60  mg by mouth 3 (three) times daily as needed for pain.), Disp: 90 tablet, Rfl: 0   morphine (MSIR) 30 MG tablet, Take 1 tablet (30 mg total) by mouth every 4 (four) hours as needed for severe pain., Disp: 90 tablet, Rfl: 0   Nutritional Supplements (FEEDING SUPPLEMENT, OSMOLITE 1.5 CAL,) LIQD, 7 cartons Osmolite 1.5 split over four feedings/day. Flush tube with 180 ml water QID. Drink by mouth or give via tube additional 2 1/2 cups fluid daily. This provides  2485 kcal, 104.3 g protein, 2581 ml total water., Disp: 1659 mL, Rfl: 0   polyethylene glycol (MIRALAX / GLYCOLAX) 17 g packet, Take 17 g by mouth daily as needed (constipation)., Disp: , Rfl:    predniSONE (DELTASONE) 5 MG tablet, Take 1 tablet (5 mg total) by mouth daily with breakfast. (Patient taking differently: Take 5 mg by mouth daily as needed (swelling, inflammation).), Disp: 30 tablet, Rfl: 1   prochlorperazine (COMPAZINE) 10 MG tablet, Take 1 tablet (10 mg total) by mouth every 6 (six) hours as needed for nausea or vomiting., Disp: 30 tablet, Rfl: 3   senna (SENOKOT) 8.6 MG tablet, Take 2 tablets by mouth 2 (two) times daily., Disp: , Rfl:   PHYSICAL EXAM: ECOG FS:1 - Symptomatic but completely ambulatory  T: 98.12F oral    BP: 110/86     HR: 106    O2: 99% on  room air    Resp:17  Physical Exam Vitals and nursing note reviewed.  Constitutional:      Appearance: He is well-developed. He is not ill-appearing or toxic-appearing.  HENT:     Head: Normocephalic and atraumatic.     Nose: Nose normal.     Mouth/Throat:     Comments: Please see media below. Noted facial deformity 2/2 cancer. Floor of mouth is erythematous with necrotic wound. There is purulent drainage from wound draining through chin. There is erythema noted  to external chin around wounds. Scabs seen on other chin wounds. Area is tender to palpation and warm. No palpable abscess. Eyes:     General: No scleral icterus.       Right eye: No discharge.        Left eye: No  discharge.     Conjunctiva/sclera: Conjunctivae normal.  Neck:     Vascular: No JVD.  Cardiovascular:     Rate and Rhythm: Normal rate and regular rhythm.     Pulses: Normal pulses.     Heart sounds: Normal heart sounds.     Comments: HR ranging from 90-99 during exam Pulmonary:     Effort: Pulmonary effort is normal.     Breath sounds: Normal breath sounds.  Abdominal:     General: There is no distension.  Musculoskeletal:        General: Normal range of motion.     Cervical back: Normal range of motion.  Skin:    General: Skin is warm and dry.     Capillary Refill: Capillary refill takes less than 2 seconds.  Neurological:     Mental Status: He is oriented to person, place, and time.     GCS: GCS eye subscore is 4. GCS verbal subscore is 5. GCS motor subscore is 6.     Comments: Fluent speech, no facial droop.  Psychiatric:        Behavior: Behavior normal.          LABORATORY DATA: I have reviewed the data as listed CBC Latest Ref Rng & Units 12/15/2020 12/12/2020 12/06/2020  WBC 4.0 - 10.5 K/uL 15.3(H) 19.4(H) 4.0  Hemoglobin 13.0 - 17.0 g/dL 11.4(L) 10.8(L) 11.3(L)  Hematocrit 39.0 - 52.0 % 35.7(L) 35.2(L) 33.9(L)  Platelets 150 - 400 K/uL 266 293 445(H)     CMP Latest Ref Rng & Units 12/15/2020 12/06/2020 12/01/2020  Glucose 70 - 99 mg/dL 107(H) 149(H) 98  BUN 6 - 20 mg/dL 15 13 17   Creatinine 0.61 - 1.24 mg/dL 0.67 0.66 0.67  Sodium 135 - 145 mmol/L 138 131(L) 136  Potassium 3.5 - 5.1 mmol/L 4.2 3.6 3.6  Chloride 98 - 111 mmol/L 99 97(L) 101  CO2 22 - 32 mmol/L 28 23 23   Calcium 8.9 - 10.3 mg/dL 9.4 9.1 8.8(L)  Total Protein 6.5 - 8.1 g/dL 7.2 7.2 6.8  Total Bilirubin 0.3 - 1.2 mg/dL <0.2(L) 0.6 0.3  Alkaline Phos 38 - 126 U/L 133(H) 103 94  AST 15 - 41 U/L 14(L) 22 25  ALT 0 - 44 U/L 14 33 24       RADIOGRAPHIC STUDIES: I have personally reviewed the radiological images as listed and agreed with the findings in the report. No images are attached to  the encounter. CT Abdomen Wo Contrast  Result Date: 12/07/2020 CLINICAL DATA:  Declining health Struggling to maintain oral intake with progressive weight loss CT obtained for evaluation for possible feeding tube placement EXAM: CT ABDOMEN WITHOUT CONTRAST TECHNIQUE: Multidetector CT imaging of the abdomen was performed following the standard protocol without IV contrast. COMPARISON:  PET-CT 08/18/2020 FINDINGS: Lower chest: 8 x 5 mm nodule in the right middle lobe is not significantly changed since prior PET-CT. Visualized lung bases are otherwise clear. Hepatobiliary: No focal liver abnormality is seen. No gallstones, gallbladder wall thickening, or biliary dilatation. Pancreas: Unremarkable. No pancreatic ductal dilatation or surrounding inflammatory changes. Spleen: Normal in size without focal abnormality. Adrenals/Urinary Tract: Adrenal glands are normal in appearance. Multiple bilateral nonobstructing renal  calculi with the largest measuring 8 mm, located on the right. Stomach/Bowel: No dilated loops of bowel within the visualized abdomen. Transfers colon is in close proximity to the stomach. Vascular/Lymphatic: No enlarged retroperitoneal or mesenteric lymph nodes. Other: No abdominal wall hernia or abnormality. Musculoskeletal: No acute or significant osseous findings. IMPRESSION: 1. Short segment of transverse colon is in close proximity to the stomach. It is possible that this segment can be displaced by inflating the stomach, creating a safe percutaneous window for G-tube placement. The patient should be given oral contrast the night before gastrostomy tube is attempted by IR. 2. Nonobstructing bilateral renal calculi. Electronically Signed   By: Miachel Roux M.D.   On: 12/07/2020 16:09   IR Gastrostomy Tube  Result Date: 12/12/2020 INDICATION: 40 year old male with history of tongue cancer, dysphagia, and failure to thrive. Gastrostomy tube requested for supplemental nutrition. EXAM: PERC  PLACEMENT GASTROSTOMY; IR CT SPINE LIMITED MEDICATIONS: Ancef 2 gm IV; Antibiotics were administered within 1 hour of the procedure. ANESTHESIA/SEDATION: Versed 4 mg IV; Fentanyl 150 mcg IV Moderate Sedation Time:  28 The patient was continuously monitored during the procedure by the interventional radiology nurse under my direct supervision. CONTRAST:  33m OMNIPAQUE IOHEXOL 300 MG/ML SOLN - administered into the gastric lumen. FLUOROSCOPY TIME:  Fluoroscopy Time: 1.5 minutes (74 mGy). COMPLICATIONS: None immediate. PROCEDURE: Informed written consent was obtained from the patient after a thorough discussion of the procedural risks, benefits and alternatives. All questions were addressed. Maximal Sterile barrier Technique was utilized including caps, mask, sterile gowns, sterile gloves, sterile drape, hand hygiene and skin antiseptic. A timeout was performed prior to the initiation of the procedure. The patient was placed on the procedure table in the supine position. Pre-procedure abdominal film confirmed visualization of the transverse colon. An angled 5-French catheter was passed through the nares into the stomach. The patient was prepped and draped in usual sterile fashion. The stomach was insufflated with air via the indwelling nasogastric tube. Under fluoroscopy, a puncture site was selected and local analgesia achieved with 1% lidocaine infiltrated subcutaneously. Under fluoroscopic guidance, a gastropexy needle was passed into the stomach and the T-bar suture was released. Entry into the stomach was confirmed with fluoroscopy, aspiration of air, and injection of contrast material. This was repeated with an additional gastropexy suture (for a total of 2 fasteners). At the center of these gastropexy sutures, a dermatotomy was performed. An 18 gauge needle was passed into the stomach at the site of this dermatotomy, and position within the gastric lumen again confirmed under fluoroscopy using aspiration of air  and contrast injection. Injection of contrast demonstrated brisk flow into what appeared to be a loop of neighboring small bowel. The needle was removed. Given small access window, cone beam CT was then performed. Cone beam CT demonstrated gaseous distension of the stomach which is well apposed to the anterior abdominal wall without any intervening loops of small or large bowel. An 18 gauge needle was passed into the stomach at the site of this dermatotomy, and position within the gastric lumen again confirmed under fluoroscopy using aspiration of air and contrast injection. An Amplatz guidewire was passed through this needle and intraluminal placement within the stomach was confirmed by fluoroscopy. The needle was removed. Over the guidewire, the percutaneous tract was dilated using a 10 mm non-compliant balloon. The balloon was deflated, then pushed into the gastric lumen followed in concert by the 20 Fr gastrostomy tube. The retention balloon of the percutaneous gastrostomy tube was  inflated with 10 mL of sterile water. The tube was withdrawn until the retention balloon was at the edge of the gastric lumen. The external bumper was brought to the abdominal wall. Contrast was injected through the gastrostomy tube, confirming intraluminal positioning. The patient tolerated the procedure well without any immediate post-procedural complications. IMPRESSION: Technically successful placement of 20 Fr gastrostomy tube with absorbable gastropexy sutures. Ruthann Cancer, MD [facility] Radiology Electronically Signed   By: Ruthann Cancer M.D.   On: 12/12/2020 10:57   IR CT [facility]  Result Date: 12/12/2020 INDICATION: 40 year old male with history of tongue cancer, dysphagia, and failure to thrive. Gastrostomy tube requested for supplemental nutrition. EXAM: PERC PLACEMENT GASTROSTOMY; IR CT SPINE LIMITED MEDICATIONS: Ancef 2 gm IV; Antibiotics were administered within 1  hour of the procedure. ANESTHESIA/SEDATION: Versed 4 mg IV; Fentanyl 150 mcg IV Moderate Sedation Time:  28 The patient was continuously monitored during the procedure by the interventional radiology nurse under my direct supervision. CONTRAST:  53m OMNIPAQUE IOHEXOL 300 MG/ML SOLN - administered into the gastric lumen. FLUOROSCOPY TIME:  Fluoroscopy Time: 1.5 minutes (74 mGy). COMPLICATIONS: None immediate. PROCEDURE: Informed written consent was obtained from the patient after a thorough discussion of the procedural risks, benefits and alternatives. All questions were addressed. Maximal Sterile barrier Technique was utilized including caps, mask, sterile gowns, sterile gloves, sterile drape, hand hygiene and skin antiseptic. A timeout was performed prior to the initiation of the procedure. The patient was placed on the procedure table in the supine position. Pre-procedure abdominal film confirmed visualization of the transverse colon. An angled 5-French catheter was passed through the nares into the stomach. The patient was prepped and draped in usual sterile fashion. The stomach was insufflated with air via the indwelling nasogastric tube. Under fluoroscopy, a puncture site was selected and local analgesia achieved with 1% lidocaine infiltrated subcutaneously. Under fluoroscopic guidance, a gastropexy needle was passed into the stomach and the T-bar suture was released. Entry into the stomach was confirmed with fluoroscopy, aspiration of air, and injection of contrast material. This was repeated with an additional gastropexy suture (for a total of 2 fasteners). At the center of these gastropexy sutures, a dermatotomy was performed. An 18 gauge needle was passed into the stomach at the site of this dermatotomy, and position within the gastric lumen again confirmed under fluoroscopy using aspiration of air and contrast injection. Injection of contrast demonstrated brisk flow into what appeared to be a loop of  neighboring small bowel. The needle was removed. Given small access window, cone beam CT was then performed. Cone beam CT demonstrated gaseous distension of the stomach which is well apposed to the anterior abdominal wall without any intervening loops of small or large bowel. An 18 gauge needle was passed into the stomach at the site of this dermatotomy, and position within the gastric lumen again confirmed under fluoroscopy using aspiration of air and contrast injection. An Amplatz guidewire was passed through this needle and intraluminal placement within the stomach was confirmed by fluoroscopy. The needle was removed. Over the guidewire, the percutaneous tract was dilated using a 10 mm non-compliant balloon. The balloon was deflated, then pushed into the gastric lumen followed in concert by the 20 Fr gastrostomy tube. The retention balloon of the percutaneous gastrostomy tube was inflated with 10 mL of sterile water. The tube was withdrawn until the retention balloon was at the edge of the gastric lumen. The external bumper was brought to the abdominal  wall. Contrast was injected through the gastrostomy tube, confirming intraluminal positioning. The patient tolerated the procedure well without any immediate post-procedural complications. IMPRESSION: Technically successful placement of 20 Fr gastrostomy tube with absorbable gastropexy sutures. Ruthann Cancer, MD [facility] Radiology Electronically Signed   By: Ruthann Cancer M.D.   On: 12/12/2020 10:57     ASSESSMENT & PLAN: Patient is a 40 y.o. male with history of tongue cancer s/p glossectomy currently receiving Cetuximab and pegfilgrastim-jmbd followed by oncologist Dr. Alvy Bimler.  #)Wound- Patient afebrile, nontoxic appearing. He has history of known abscess in floor of mouth that has been draining, thought to be drainage from necrotic tumor per oncologist's previous notes. On exam today patient has  significant purulent drainage. The surrounding area is erythematous and tender to palpation. Clinically there is concern for infection given he was asymptomatic while on clindamycin and he has tenderness and erythema with drainage today. I viewed labs from today and CBC shows leukocytosis 15.3, could be related to recent steroids and pegfilgrastim injection. CMP without renal insufficiency. Discussed treatment options with patient to include another trial of antibiotics this time with Bactrim to still include MRSA coverage. Will also place referral for ID evaluation of wound given this is 3rd round of antibiotics and he is having difficulty with wound healing. He has not seen wound management in the past per patient.   #)Tongue cancer- continue treatment per primary oncologist. Dr. Alvy Bimler is not in clinic today. He has appointment already scheduled with her 11/25 which I recommendedh e keep for follow up.   Visit Diagnosis: 1. Tongue cancer ([place])   2. Open wound of mouth, initial encounter      Orders Placed This Encounter  Procedures   Ambulatory referral to Infectious Disease    Referral Priority:   Urgent    Referral Type:   Consultation    Referral Reason:   Specialty Services Required    Requested Specialty:   Infectious Diseases    Number of Visits Requested:   1    All questions were answered. The patient knows to call the clinic with any problems, questions or concerns. No barriers to learning was detected.  I have spent a total of 20 minutes minutes of face-to-face and non-face-to-face time, preparing to see the patient, obtaining and/or reviewing separately obtained history, performing a medically appropriate examination, counseling and educating the patient, ordering tests,  documenting clinical information in the electronic health record, and care coordination.     Thank you for allowing me to participate in the care of this patient.    Barrie Folk,  PA-C Department of Hematology/Oncology [facility] at [facility] Phone: [phone]  [phone]    12/15/2020 5:36 PM

## 2020-12-18 ENCOUNTER — Encounter: Payer: Self-pay | Admitting: Internal Medicine

## 2020-12-18 ENCOUNTER — Other Ambulatory Visit: Payer: Self-pay

## 2020-12-18 ENCOUNTER — Telehealth: Payer: Self-pay

## 2020-12-18 ENCOUNTER — Ambulatory Visit (INDEPENDENT_AMBULATORY_CARE_PROVIDER_SITE_OTHER): Payer: BC Managed Care – PPO | Admitting: Internal Medicine

## 2020-12-18 VITALS — BP 135/84 | HR 126 | Temp 97.5°F | Wt 183.0 lb

## 2020-12-18 DIAGNOSIS — C760 Malignant neoplasm of head, face and neck: Secondary | ICD-10-CM | POA: Diagnosis not present

## 2020-12-18 DIAGNOSIS — L988 Other specified disorders of the skin and subcutaneous tissue: Secondary | ICD-10-CM | POA: Diagnosis not present

## 2020-12-18 NOTE — Telephone Encounter (Signed)
Spoke with patient to offer follow-up visit with Dr. Alvy Bimler tomorrow. Patient agreed to an 8:50 appointment time. Appointment scheduled. Dr. Alvy Bimler made aware.

## 2020-12-18 NOTE — Patient Instructions (Signed)
Will need to rule out infection with actinomyces which is a difficult bacteria to isolate/treat. Avoid antibiotics for now until seen by ENT doctor (potentially for I&D and biopsy)  Neck ct scan repeat to look for abscess  See me in 2 weeks, or call me if fever, chill, rapidly spreading redness/pain

## 2020-12-18 NOTE — Telephone Encounter (Signed)
-----   Message from Heath Lark, MD sent at 12/18/2020 10:07 AM EST ----- Pls call his or wife I will see him tomorrow for evaluation only [facility] saw him and referred him to ID and I need to discuss next step with him

## 2020-12-18 NOTE — Progress Notes (Signed)
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 Clindamycin then bactrim rx since 10/21 and referred to id clinic  No f/c Tender when he opens his mouth Able to eat liquid only Has peg tube as well for extra nutrition  Cancer -- mets in lung -- recurrent/mets dx'ed 05/2020  Review of Systems: ROS All other ros negative     Past Medical History:  Diagnosis  Date   Bradycardia 03/16/2014   Dry mouth    radiation   GERD (gastroesophageal reflux disease)    occ   Gout    Head and neck cancer (HCC)    tongue cancer   High triglycerides    History of kidney stones    History of radiation therapy 06/02/12- 07/14/12   oral tongue and bilateral neck, 60 gray in 30 fx   Hypothyroidism    radiation- not working   PONV (postoperative nausea and vomiting)    tongue swelled-emergency trach   Ringing in ears    Staph skin infection    Elbow- resolved.     Stones in the urinary tract    Tongue cancer ([place])    Xerostomia 11/30/2012    Social History   Tobacco Use   Smoking status: Former    Types: Cigars    Quit date: 08/14/2011    Years since quitting: 9.3   Smokeless tobacco: Never  Vaping Use   Vaping Use: Never used  Substance Use Topics   Alcohol use: No    Comment: quit drinking 7/13 from 12 pack of beer on weekends   Drug use: No    Family History  Problem Relation Age of Onset   Rheum arthritis Mother    Asthma Father    Cancer Maternal Grandmother    Diabetes Sister    Diabetes Brother    Colon cancer Neg Hx     Allergies  Allergen  Reactions   Cetuximab Hives    Hypersensitivity protocol utilized. Symptoms quickly resolved. Drug resumed and completed without further incident.     OBJECTIVE: Vitals:   12/18/20 0858  BP: 135/84  Pulse: (!) 126  Temp: (!) 97.5 F (36.4 C)  TempSrc: Temporal  SpO2: 97%  Weight: 183 lb (83 kg)   Body mass index is 24.82 kg/m.   Physical Exam General/constitutional: no distress, pleasant HEENT: Normocephalic, PER, Conj Clear; right lower jaw with ?tooth decay vs infected area near molar area; tender to palpation there and along lower jaw on right; no fluctuance (see picture for whitish coating on right molar area) Neck: indurated erythematous plaque with purulence CV: rrr no mrg Lungs: clear to auscultation, normal respiratory effort Abd: Soft, Nontender; peg site  clean/dry Ext: no edema Skin: see picture Neuro: nonfocal MSK: no peripheral joint swelling/tenderness/warmth; back spines nontender  11/21        Central line presence: right chest port site no erythema/purulence  Lab: Lab Results  Component Value Date   WBC 15.3 (H) 12/15/2020   HGB 11.4 (L) 12/15/2020   HCT 35.7 (L) 12/15/2020   MCV 88.4 12/15/2020   PLT 266 29/56/2130   Last metabolic panel Lab Results  Component Value Date   GLUCOSE 107 (H) 12/15/2020   NA 138 12/15/2020   K 4.2 12/15/2020   CL 99 12/15/2020   CO2 28 12/15/2020   BUN 15 12/15/2020   CREATININE 0.67 12/15/2020   GFRNONAA >60 12/15/2020   CALCIUM 9.4 12/15/2020   PROT 7.2 12/15/2020   ALBUMIN 3.6 12/15/2020   BILITOT <0.2 (L) 12/15/2020   ALKPHOS 133 (H) 12/15/2020   AST 14 (L) 12/15/2020   ALT 14 12/15/2020   ANIONGAP 11 12/15/2020    Microbiology:  Serology:  Imaging: Reviewed 09/2018 ct neck  Assessment/plan: Problem List Items Addressed This Visit   None Visit Diagnoses     Head and neck cancer ([place])    -  Primary   Draining cutaneous sinus tract             Multifactorial but agree concerning changes for infectious process as well, potentially jaw involvement. Actinomyces a high consideration, so would like ent to evaluate and biopsy/culture for treatment planning with abx as if that is present would require initial iv penicillin and prolonged 6 months at least beta-lactam therapy  Possibly will need to have oncologic team review and see if chemo might need to be temporarily hold while getting infection cleaned/treated  -ent referal -hold abx for now -- actinomyces difficult to cultivate -f/u 2 weeks or sooner if fever/chill, rapidly spreading redness/pain -- might need admission for expedited workup       Follow-up: Return in about 2 weeks (around 01/01/2021).  Jabier Mutton, [place] [facility] [phone] pager    [phone] cell 12/18/2020, 9:25 AM

## 2020-12-19 ENCOUNTER — Inpatient Hospital Stay (HOSPITAL_BASED_OUTPATIENT_CLINIC_OR_DEPARTMENT_OTHER): Payer: BC Managed Care – PPO | Admitting: Hematology and Oncology

## 2020-12-19 ENCOUNTER — Other Ambulatory Visit (HOSPITAL_COMMUNITY): Payer: Self-pay

## 2020-12-19 ENCOUNTER — Encounter: Payer: Self-pay | Admitting: Hematology and Oncology

## 2020-12-19 VITALS — BP 125/79 | HR 119 | Temp 97.6°F | Resp 18 | Ht 72.0 in | Wt 185.2 lb

## 2020-12-19 DIAGNOSIS — K122 Cellulitis and abscess of mouth: Secondary | ICD-10-CM

## 2020-12-19 DIAGNOSIS — G893 Neoplasm related pain (acute) (chronic): Secondary | ICD-10-CM | POA: Diagnosis not present

## 2020-12-19 DIAGNOSIS — C7802 Secondary malignant neoplasm of left lung: Secondary | ICD-10-CM

## 2020-12-19 DIAGNOSIS — C029 Malignant neoplasm of tongue, unspecified: Secondary | ICD-10-CM

## 2020-12-19 DIAGNOSIS — C7801 Secondary malignant neoplasm of right lung: Secondary | ICD-10-CM | POA: Diagnosis not present

## 2020-12-19 DIAGNOSIS — Z5112 Encounter for antineoplastic immunotherapy: Secondary | ICD-10-CM | POA: Diagnosis not present

## 2020-12-19 DIAGNOSIS — R634 Abnormal weight loss: Secondary | ICD-10-CM

## 2020-12-19 LAB — C-REACTIVE PROTEIN: CRP: 27.1 mg/L — ABNORMAL HIGH (ref <8.0)

## 2020-12-19 MED ORDER — MORPHINE SULFATE 30 MG PO TABS
30.0000 mg | ORAL_TABLET | ORAL | 0 refills | Status: DC | PRN
  Filled 2020-12-19: qty 90, 30d supply, fill #0

## 2020-12-19 MED ORDER — MORPHINE SULFATE ER 60 MG PO TBCR
60.0000 mg | EXTENDED_RELEASE_TABLET | Freq: Three times a day (TID) | ORAL | 0 refills | Status: DC
  Filled 2020-12-19: qty 90, 30d supply, fill #0

## 2020-12-19 MED FILL — Dexamethasone Sodium Phosphate Inj 100 MG/10ML: INTRAMUSCULAR | Qty: 1 | Status: AC

## 2020-12-19 MED FILL — Fosaprepitant Dimeglumine For IV Infusion 150 MG (Base Eq): INTRAVENOUS | Qty: 5 | Status: AC

## 2020-12-19 NOTE — Assessment & Plan Note (Signed)
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 The biggest concern I have is whether the patient has progressed on chemotherapy It would be impossible to assess definitively response to treatment given the oral fistula However, he had measurable lung disease on prior CT imaging of the chest Given significant signs of fistula, nonhealing wound at the base of his oropharynx as well as elevated white count, I recommend we discontinue chemotherapy this week and focus on supportive care I will try to get CT imaging of the chest added to his schedule CT scan of the neck that was ordered by infectious disease physician for objective assessment of response to treatment If he has definitive signs of cancer progression in his lungs, then he would not be a candidate for further treatment

## 2020-12-19 NOTE — Assessment & Plan Note (Signed)
He has developed oral fistula and significant signs of infection at the floor of the mouth I recommend he continues taking Bactrim and focus on wound care I also recommend the patient not to eat food orally and focus on using his feeding tube for now to see if we can reduce bacterial overgrowth

## 2020-12-19 NOTE — Assessment & Plan Note (Signed)
He is gaining weight but will likely be dependent on feeding tube in the future We will get dietitian to continue to follow on his nutritional needs

## 2020-12-19 NOTE — Assessment & Plan Note (Signed)
His pain is well controlled.  I have refilled his prescription pain medicine He will continue using his prescription of Magic mouthwash with lidocaine

## 2020-12-19 NOTE — Progress Notes (Signed)
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 The biggest concern I have is whether the patient has progressed on chemotherapy It would be impossible to assess definitively response to treatment given the oral fistula However, he had measurable lung disease on prior CT imaging of the chest Given significant signs of fistula, nonhealing wound at the base of his oropharynx as well as elevated white count, I recommend we discontinue chemotherapy this week and focus on supportive care I will try to get CT imaging of the chest added to his schedule CT scan of the neck that was ordered by infectious disease physician for objective assessment of response to treatment If he has definitive signs of cancer progression in his lungs, then he would not be a candidate for further treatment  Oral fistula He has developed oral fistula and significant signs of infection at the floor of the mouth I recommend he continues taking Bactrim and focus on wound care I also recommend the patient not to eat food orally and focus on using his feeding tube for now to see if we can reduce bacterial overgrowth  Cancer associated pain His pain is well controlled.  I have refilled his prescription pain medicine He will continue using his prescription of Magic mouthwash with lidocaine  Weight loss, abnormal He is gaining weight but will likely be dependent on feeding tube in the future We will get dietitian to continue to follow on his nutritional needs  Orders Placed This  Encounter  Procedures   CT CHEST W CONTRAST    Standing Status:   Future    Standing Expiration Date:   12/19/2021    Order Specific Question:   If indicated for the ordered procedure, I authorize the administration of contrast media per Radiology protocol    Answer:   Yes    Order Specific Question:   Preferred imaging location?    Answer:   [facility]    Order Specific Question:   Radiology Contrast Protocol - do NOT remove file path    Answer:   [email or web address]    All questions were answered. The patient knows to call the clinic with any problems, questions or concerns. The total time spent in the appointment was 40 minutes encounter with patients including review of chart and various tests results, discussions about plan of care and coordination of care plan   Heath Lark, MD 12/19/2020 10:16 AM  INTERVAL HISTORY: Please see below for problem oriented charting. he returns for follow-up with his wife due to recent findings of oral fistula He was seen at symptom management last week due to significant breakout of skin lesions at the floor of his mouth His blood count show evidence of leukocytosis and he was started on Bactrim He was referred to see infectious disease physician who has ordered CT imaging of the neck for evaluation He denies significant pain He has trouble managing persistent drainage from the oral fistula No fever or chills He is gaining weight by adding nutritional supplement through his feeding tube  REVIEW OF SYSTEMS:   Constitutional: Denies fevers, chills Eyes: Denies blurriness of vision Ears, nose,  mouth, throat, and face: Denies mucositis or sore throat Respiratory: Denies cough, dyspnea or wheezes Cardiovascular: Denies palpitation, chest discomfort or lower extremity swelling Gastrointestinal:  Denies nausea, heartburn or change in bowel habits Lymphatics: Denies new lymphadenopathy or easy  bruising Neurological:Denies numbness, tingling or new weaknesses Behavioral/Psych: Mood is stable, no new changes  All other systems were reviewed with the patient and are negative.  I have reviewed the past medical history, past surgical history, social history and family history with the patient and they are unchanged from previous note.  ALLERGIES:  is allergic to cetuximab.  MEDICATIONS:  Current Outpatient Medications  Medication Sig Dispense Refill   loratadine (CLARITIN) 10 MG tablet Take 10 mg by mouth daily as needed for allergies.     magic mouthwash (nystatin, lidocaine, diphenhydrAMINE, alum & mag hydroxide) suspension Swish and spit 5 mls 4 times a day as needed 240 mL 1   morphine (MS CONTIN) 60 MG 12 hr tablet Take 1 tablet (60 mg total) by mouth in the morning, at noon, and at bedtime. 90 tablet 0   morphine (MSIR) 30 MG tablet Take 1 tablet (30 mg total) by mouth every 4 (four) hours as needed for severe pain. 90 tablet 0   Nutritional Supplements (FEEDING SUPPLEMENT, OSMOLITE 1.5 CAL,) LIQD 7 cartons Osmolite 1.5 split over four feedings/day. Flush tube with 180 ml water QID. Drink by mouth or give via tube additional 2 1/2 cups fluid daily. This provides  2485 kcal, 104.3 g protein, 2581 ml total water. 1659 mL 0   polyethylene glycol (MIRALAX / GLYCOLAX) 17 g packet Take 17 g by mouth daily as needed (constipation).     predniSONE (DELTASONE) 5 MG tablet Take 1 tablet (5 mg total) by mouth daily with breakfast. (Patient taking differently: Take 5 mg by mouth daily as needed (swelling, inflammation).) 30 tablet 1   prochlorperazine (COMPAZINE) 10 MG tablet Take 1 tablet (10 mg total) by mouth every 6 (six) hours as needed for nausea or vomiting. 30 tablet 3   senna (SENOKOT) 8.6 MG tablet Take 2 tablets by mouth 2 (two) times daily. (Patient not taking: Reported on 12/18/2020)     sulfamethoxazole-trimethoprim (BACTRIM DS) 800-160 MG tablet Take 1 tablet by mouth 2 (two)  times daily for 14 days. 28 tablet 0   No current facility-administered medications for this visit.    SUMMARY OF ONCOLOGIC HISTORY: Oncology History  Tongue cancer ([place])  08/22/2011 Surgery   He had partial glossectomy which showed invasive SCC measured 1.3 cm   09/06/2011 Surgery   He underwent right neck dissection which showed 1/24 LN involved and repeat resection of tongue was negative   03/23/2012 Surgery   Repeat tngue resection showed well differentiated SCC 1.5 cm which invaded into the muscle   04/17/2012 Imaging   PET/CT scan showed New adenopathy in the left neck, compatible with recurrent malignancy   04/23/2012 Surgery   he underwent left neck LN dissection and 2/34 LN were positve   06/01/2012 - 07/13/2012 Chemotherapy   Patient had 3 cycles of cisplatin   06/12/2012 - 07/14/2012 Radiation Therapy   Patient completed RT   12/10/2012 Imaging   Interval resolution of previous hypermetabolic cervical adenopathy. There is nonspecific asymmetric increased uptake within the right side of tongue   03/13/2013 Imaging   PET/CT scan showed no evidence of disease recurrence. He was noted to have incidental kidney stones   08/15/2014 Imaging   CT scan of the dissection no evidence of  disease   05/02/2020 Imaging   CT scan of neck 1. 4.3 x 2.7 x 2.7 cm peripherally enhancing mass at the anterior tongue base with focal sclerosis of the anterior genu of the mandible. 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 2. 8 mm peripherally enhancing left level 2 lymph node is concerning for metastatic disease. Recommend PET scan of the neck to further characterize disease. 3. No other significant recurrent left-sided lymph nodes. 4. Post radiation changes of the carotid sheath bilaterally. 5. Post radiation changes at the lung apices bilaterally. 6. Polyps or mucous retention cysts within the maxillary sinuses bilaterally.   05/24/2020  Pathology Results   A. SUBMENTAL MASS, MIDLINE, NEEDLE CORE BIOPSY:  - Poorly differentiated sarcomatoid malignancy.   COMMENT:   CK7 is positive.  CKAE1/AE3, CK8/18, CK20, p40, CK5/6, S-100, Melan-A and Desmin are negative.  SMA demonstrates nonspecific staining.  Ki-67 proliferation index is high.  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     05/24/2020 Procedure   Successful ultrasound submental mass 18 gauge core biopsy   06/07/2020 PET scan   IMPRESSION: 1. Unfortunately evidence of squamous cell carcinoma recurrence in the floor of the mouth. Broad lesion with intense peripheral metabolic activity in the anterior RIGHT floor the mouth. 2. Bilateral hypermetabolic nodules within the sub submandibular which are new from PET-CT 2015. Concern for unusual metastasis to the submandibular glands. 3. New small bilateral small pulmonary nodules with faint radiotracer activity. These are also concerning for metastasis. 4. Asymmetric hypermetabolic activity within the RIGHT testicle. Favor benign inflammation; however consider testicular ultrasound if concern for unusual pattern of metastasis.   06/15/2020 Procedure   Successful placement of a right internal jugular approach power injectable Port-A-Cath. The catheter is ready for immediate use.     06/19/2020 - 08/05/2020 Chemotherapy          08/21/2020 PET scan   1. Progressive tumor in the floor the mouth with overt permeative destructive changes involving the mandible. 2. Persistent hypermetabolic neck nodes as detailed above. No new findings. 3. Progressive pulmonary metastatic disease. 4. No findings for abdominal/pelvic metastatic disease. 5. Persistent hypermetabolism in the right hemiscrotum most likely due to a varicocele.     08/29/2020 -  Chemotherapy   Patient is on Treatment Plan : HEAD/NECK Cisplatin + Docetaxel q21d plus cetuximab      08/31/2020 -  Chemotherapy    Patient is on Treatment Plan : HEAD/NECK Cetuximab q7d     10/27/2020 Imaging   1. Allowing for differing technique, no change in bilateral solid pulmonary nodules. 2. Several ground-glass nodules in the RIGHT upper lobe and mild tree-in-bud pattern in the RIGHT lower lobe could indicate mild pulmonary infection. 3. No lymphadenopathy.     10/27/2020 Imaging   Considerable enlargement of tumor at the anterior tongue and floor of the mouth measuring approximately 6 x 5 x 3 cm, with areas of intratumoral necrosis, particularly anterior.   Considerable progression of lytic destructive involvement of the mandible, more extensive on the right side than the left.   Enlargement of a low-density malignant level 2 node on the left, 12 mm today compared with 7 mm in April.   12/12/2020 Procedure   Technically successful placement of 20 Fr gastrostomy tube with absorbable gastropexy sutures   Metastasis to lung ([place])  06/09/2020 Initial Diagnosis   Metastasis to lung [facility])   08/31/2020 -  Chemotherapy   Patient  is on Treatment Plan : HEAD/NECK Cetuximab q7d       PHYSICAL EXAMINATION: ECOG PERFORMANCE STATUS: 2 - Symptomatic, <50% confined to bed  Vitals:   12/19/20 0851  BP: 125/79  Pulse: (!) 119  Resp: 18  Temp: 97.6 F (36.4 C)  SpO2: 100%   Filed Weights   12/19/20 0851  Weight: 185 lb 3.2 oz (84 kg)    GENERAL:alert, no distress and comfortable SKIN: Significant erythematous changes at the floor of the mouth from infection EYES: normal, Conjunctiva are pink and non-injected, sclera clear OROPHARYNX: He has persistent malignant growth in the floor of the mouth but now with new onset of fistula that is draining consistently NECK: Significant radiation-induced fibrosis is noted LYMPH:  no palpable lymphadenopathy in the cervical, axillary or inguinal LUNGS: clear to auscultation and percussion with normal breathing effort HEART: regular rate & rhythm and no murmurs and no lower  extremity edema ABDOMEN:abdomen soft, non-tender and normal bowel sounds.  His feeding tube looks okay Musculoskeletal:no cyanosis of digits and no clubbing  NEURO: alert & oriented x 3 with fluent speech, no focal motor/sensory deficits  LABORATORY DATA:  I have reviewed the data as listed    Component Value Date/Time   NA 138 12/15/2020 0957   NA 141 04/29/2016 1402   K 4.2 12/15/2020 0957   K 4.1 04/29/2016 1402   CL 99 12/15/2020 0957   CL 100 07/10/2012 1341   CO2 28 12/15/2020 0957   CO2 25 04/29/2016 1402   GLUCOSE 107 (H) 12/15/2020 0957   GLUCOSE 91 04/29/2016 1402   GLUCOSE 118 (H) 07/10/2012 1341   BUN 15 12/15/2020 0957   BUN 9.7 04/29/2016 1402   CREATININE 0.67 12/15/2020 0957   CREATININE 1.0 04/29/2016 1402   CALCIUM 9.4 12/15/2020 0957   CALCIUM 9.3 04/29/2016 1402   PROT 7.2 12/15/2020 0957   PROT 7.0 04/29/2016 1402   ALBUMIN 3.6 12/15/2020 0957   ALBUMIN 4.1 04/29/2016 1402   AST 14 (L) 12/15/2020 0957   AST 28 04/29/2016 1402   ALT 14 12/15/2020 0957   ALT 26 04/29/2016 1402   ALKPHOS 133 (H) 12/15/2020 0957   ALKPHOS 71 04/29/2016 1402   BILITOT <0.2 (L) 12/15/2020 0957   BILITOT 0.43 04/29/2016 1402   GFRNONAA >60 12/15/2020 0957   GFRAA >60 05/02/2017 0901    No results found for: SPEP, UPEP  Lab Results  Component Value Date   WBC 15.3 (H) 12/15/2020   NEUTROABS 13.9 (H) 12/15/2020   HGB 11.4 (L) 12/15/2020   HCT 35.7 (L) 12/15/2020   MCV 88.4 12/15/2020   PLT 266 12/15/2020      Chemistry      Component Value Date/Time   NA 138 12/15/2020 0957   NA 141 04/29/2016 1402   K 4.2 12/15/2020 0957   K 4.1 04/29/2016 1402   CL 99 12/15/2020 0957   CL 100 07/10/2012 1341   CO2 28 12/15/2020 0957   CO2 25 04/29/2016 1402   BUN 15 12/15/2020 0957   BUN 9.7 04/29/2016 1402   CREATININE 0.67 12/15/2020 0957   CREATININE 1.0 04/29/2016 1402      Component Value Date/Time   CALCIUM 9.4 12/15/2020 0957   CALCIUM 9.3 04/29/2016 1402    ALKPHOS 133 (H) 12/15/2020 0957   ALKPHOS 71 04/29/2016 1402   AST 14 (L) 12/15/2020 0957   AST 28 04/29/2016 1402   ALT 14 12/15/2020 0957   ALT 26 04/29/2016 1402   BILITOT <  0.2 (L) 12/15/2020 0957   BILITOT 0.43 04/29/2016 1402

## 2020-12-20 ENCOUNTER — Inpatient Hospital Stay: Payer: BC Managed Care – PPO

## 2020-12-22 ENCOUNTER — Inpatient Hospital Stay: Payer: BC Managed Care – PPO

## 2020-12-22 ENCOUNTER — Inpatient Hospital Stay: Payer: BC Managed Care – PPO | Admitting: Dietician

## 2020-12-22 ENCOUNTER — Inpatient Hospital Stay: Payer: BC Managed Care – PPO | Admitting: Hematology and Oncology

## 2020-12-25 ENCOUNTER — Other Ambulatory Visit: Payer: Self-pay

## 2020-12-25 ENCOUNTER — Ambulatory Visit (HOSPITAL_COMMUNITY)
Admission: RE | Admit: 2020-12-25 | Discharge: 2020-12-25 | Disposition: A | Discharge status: Home / Self Care | Payer: BC Managed Care – PPO | Source: Ambulatory Visit | Attending: Internal Medicine | Admitting: Internal Medicine

## 2020-12-25 DIAGNOSIS — C7801 Secondary malignant neoplasm of right lung: Secondary | ICD-10-CM | POA: Insufficient documentation

## 2020-12-25 DIAGNOSIS — L988 Other specified disorders of the skin and subcutaneous tissue: Secondary | ICD-10-CM | POA: Insufficient documentation

## 2020-12-25 DIAGNOSIS — C029 Malignant neoplasm of tongue, unspecified: Secondary | ICD-10-CM | POA: Diagnosis present

## 2020-12-25 DIAGNOSIS — C760 Malignant neoplasm of head, face and neck: Secondary | ICD-10-CM | POA: Diagnosis not present

## 2020-12-25 DIAGNOSIS — C7802 Secondary malignant neoplasm of left lung: Secondary | ICD-10-CM | POA: Insufficient documentation

## 2020-12-25 MED ORDER — IOHEXOL 350 MG/ML SOLN
80.0000 mL | Freq: Once | INTRAVENOUS | Status: AC | PRN
  Administered 2020-12-25: 19:00:00 80 mL via INTRAVENOUS

## 2020-12-28 MED FILL — Fosaprepitant Dimeglumine For IV Infusion 150 MG (Base Eq): INTRAVENOUS | Qty: 5 | Status: AC

## 2020-12-28 MED FILL — Dexamethasone Sodium Phosphate Inj 100 MG/10ML: INTRAMUSCULAR | Qty: 1 | Status: AC

## 2020-12-29 ENCOUNTER — Other Ambulatory Visit: Payer: Self-pay

## 2020-12-29 ENCOUNTER — Encounter: Payer: Self-pay | Admitting: Hematology and Oncology

## 2020-12-29 ENCOUNTER — Inpatient Hospital Stay (HOSPITAL_BASED_OUTPATIENT_CLINIC_OR_DEPARTMENT_OTHER): Payer: BC Managed Care – PPO | Admitting: Hematology and Oncology

## 2020-12-29 ENCOUNTER — Inpatient Hospital Stay: Payer: BC Managed Care – PPO | Admitting: Dietician

## 2020-12-29 ENCOUNTER — Other Ambulatory Visit (HOSPITAL_COMMUNITY): Payer: Self-pay

## 2020-12-29 ENCOUNTER — Inpatient Hospital Stay: Payer: BC Managed Care – PPO | Attending: Hematology and Oncology

## 2020-12-29 ENCOUNTER — Inpatient Hospital Stay: Payer: BC Managed Care – PPO

## 2020-12-29 DIAGNOSIS — Z452 Encounter for adjustment and management of vascular access device: Secondary | ICD-10-CM | POA: Insufficient documentation

## 2020-12-29 DIAGNOSIS — R634 Abnormal weight loss: Secondary | ICD-10-CM | POA: Insufficient documentation

## 2020-12-29 DIAGNOSIS — C029 Malignant neoplasm of tongue, unspecified: Secondary | ICD-10-CM

## 2020-12-29 DIAGNOSIS — G893 Neoplasm related pain (acute) (chronic): Secondary | ICD-10-CM

## 2020-12-29 DIAGNOSIS — K1231 Oral mucositis (ulcerative) due to antineoplastic therapy: Secondary | ICD-10-CM

## 2020-12-29 DIAGNOSIS — Z7189 Other specified counseling: Secondary | ICD-10-CM

## 2020-12-29 LAB — CMP (CANCER CENTER ONLY)
ALT: 17 U/L (ref 0–44)
AST: 19 U/L (ref 15–41)
Albumin: 3.5 g/dL (ref 3.5–5.0)
Alkaline Phosphatase: 95 U/L (ref 38–126)
Anion gap: 9 (ref 5–15)
BUN: 12 mg/dL (ref 6–20)
CO2: 27 mmol/L (ref 22–32)
Calcium: 9.1 mg/dL (ref 8.9–10.3)
Chloride: 101 mmol/L (ref 98–111)
Creatinine: 0.8 mg/dL (ref 0.61–1.24)
GFR, Estimated: >60 mL/min (ref >60)
Glucose, Bld: 130 mg/dL — ABNORMAL HIGH (ref 70–99)
Potassium: 3.9 mmol/L (ref 3.5–5.1)
Sodium: 137 mmol/L (ref 135–145)
Total Bilirubin: 0.3 mg/dL (ref 0.3–1.2)
Total Protein: 7 g/dL (ref 6.5–8.1)

## 2020-12-29 LAB — CBC WITH DIFFERENTIAL (CANCER CENTER ONLY)
Abs Immature Granulocytes: 0.03 10*3/uL (ref 0.00–0.07)
Basophils Absolute: 0.1 10*3/uL (ref 0.0–0.1)
Basophils Relative: 1 %
Eosinophils Absolute: 0.1 10*3/uL (ref 0.0–0.5)
Eosinophils Relative: 1 %
HCT: 33.5 % — ABNORMAL LOW (ref 39.0–52.0)
Hemoglobin: 10.7 g/dL — ABNORMAL LOW (ref 13.0–17.0)
Immature Granulocytes: 0 %
Lymphocytes Relative: 3 %
Lymphs Abs: 0.3 10*3/uL — ABNORMAL LOW (ref 0.7–4.0)
MCH: 28.3 pg (ref 26.0–34.0)
MCHC: 31.9 g/dL (ref 30.0–36.0)
MCV: 88.6 fL (ref 80.0–100.0)
Monocytes Absolute: 0.6 10*3/uL (ref 0.1–1.0)
Monocytes Relative: 7 %
Neutro Abs: 7.8 10*3/uL — ABNORMAL HIGH (ref 1.7–7.7)
Neutrophils Relative %: 88 %
Platelet Count: 330 10*3/uL (ref 150–400)
RBC: 3.78 MIL/uL — ABNORMAL LOW (ref 4.22–5.81)
RDW: 16.6 % — ABNORMAL HIGH (ref 11.5–15.5)
WBC Count: 8.8 10*3/uL (ref 4.0–10.5)
nRBC: 0 % (ref 0.0–0.2)

## 2020-12-29 LAB — MAGNESIUM: Magnesium: 2 mg/dL (ref 1.7–2.4)

## 2020-12-29 MED ORDER — DOXYCYCLINE HYCLATE 100 MG PO TABS
100.0000 mg | ORAL_TABLET | Freq: Two times a day (BID) | ORAL | 3 refills | Status: DC
  Filled 2020-12-29: qty 60, 30d supply, fill #0

## 2020-12-29 MED ORDER — SODIUM CHLORIDE 0.9% FLUSH
10.0000 mL | Freq: Once | INTRAVENOUS | Status: AC
  Administered 2020-12-29: 10 mL

## 2020-12-29 MED ORDER — ALUM & MAG HYDROXIDE-SIMETH 200-200-20 MG/5ML PO SUSP
5.0000 mL | Freq: Four times a day (QID) | ORAL | 1 refills | Status: DC | PRN
Start: 2020-12-29 — End: 2021-01-25

## 2020-12-29 MED ORDER — NYSTATIN 100000 UNIT/ML MT SUSP
OROMUCOSAL | 1 refills | Status: DC
  Filled 2020-12-29: qty 240, 12d supply, fill #0

## 2020-12-29 MED ORDER — PREDNISONE 5 MG PO TABS
5.0000 mg | ORAL_TABLET | Freq: Every day | ORAL | 2 refills | Status: DC | PRN
  Filled 2020-12-29: qty 30, 30d supply, fill #0

## 2020-12-29 NOTE — Progress Notes (Signed)
Pt's wife aware of Rx refill for magic mouth wash at [facility].

## 2020-12-29 NOTE — Progress Notes (Signed)
[place] OFFICE PROGRESS NOTE  Patient Care Team: Redmond School, MD as PCP - General (Internal Medicine) [name], MD as Attending Physician (Otolaryngology) Gery Pray, MD as Attending Physician (Radiation Oncology) Heath Lark, MD as Consulting Physician (Hematology and Oncology) Daneil Dolin, MD as Consulting Physician (Gastroenterology)  ASSESSMENT & PLAN:  Tongue cancer Unfortunately, his recent CT imaging show progression The patient has progressed on carboplatin, 5-FU, pembrolizumab, cisplatin, paclitaxel as well as cetuximab He is not a candidate for surgery or further radiation We discussed the risk and benefits of second opinion Due to his ongoing oral infection, I doubt he is a candidate for any clinical trial Ultimately, he agreed for palliative care referral as well as supportive care I will see him on a monthly basis for symptom management  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 We will refill Magic mouthwash He responded well to aggressive antibiotics He will continue doxycycline and wound care  Cancer associated pain His pain is well controlled He will continue the same regimen  Weight loss, abnormal He is gaining weight He will continue dietitian review and nutritional supplement as needed  Goals of care, counseling/discussion We have extensive discussions about goals of care Without further treatment, we can continue to provide symptom management and supportive care He agreed for palliative care referral We discussed prognosis will be less than 6 months  Orders Placed This Encounter  Procedures   Amb Referral to Palliative Care    Referral Priority:   Routine    Referral Type:   Consultation    Number of Visits Requested:   1    All questions were answered. The patient knows to call the clinic with any problems, questions or concerns. The total time spent in the appointment was 55  minutes encounter with patients including review of chart and various tests results, discussions about plan of care and coordination of care plan   Heath Lark, MD 12/29/2020 11:24 AM  INTERVAL HISTORY: Please see below for problem oriented charting. 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 with wound care and antibiotics He is gaining weight  REVIEW OF SYSTEMS:   Constitutional: Denies fevers, chills or abnormal weight loss Eyes: Denies blurriness of vision Respiratory: Denies cough, dyspnea or wheezes Cardiovascular: Denies palpitation, chest discomfort or lower extremity swelling Gastrointestinal:  Denies nausea, heartburn or change in bowel habits Skin: Denies abnormal skin rashes Lymphatics: Denies new lymphadenopathy or easy bruising Neurological:Denies numbness, tingling or new weaknesses Behavioral/Psych: Mood is stable, no new changes  All other systems were reviewed with the patient and are negative.  I have reviewed the past medical history, past surgical history, social history and family history with the patient and they are unchanged from previous note.  ALLERGIES:  is allergic to cetuximab.  MEDICATIONS:  Current Outpatient Medications  Medication Sig Dispense Refill   doxycycline (VIBRA-TABS) 100 MG tablet Take 1 tablet (100 mg total) by mouth 2 (two) times daily. 60 tablet 3   loratadine (CLARITIN) 10 MG tablet Take 10 mg by mouth daily as needed for allergies.     magic mouthwash (nystatin, lidocaine, diphenhydrAMINE, alum & mag hydroxide) suspension Swish and spit 5 mls 4 times a day as needed 240 mL 1   morphine (MS CONTIN) 60 MG 12 hr tablet Take 1 tablet (60 mg total) by mouth in the morning, at noon, and at bedtime. 90 tablet 0   morphine (MSIR)  30 MG tablet Take 1 tablet (30 mg total) by mouth every 4 (four) hours as needed for severe pain. 90 tablet 0   Nutritional Supplements (FEEDING SUPPLEMENT, OSMOLITE 1.5 CAL,)  LIQD 7 cartons Osmolite 1.5 split over four feedings/day. Flush tube with 180 ml water QID. Drink by mouth or give via tube additional 2 1/2 cups fluid daily. This provides  2485 kcal, 104.3 g protein, 2581 ml total water. 1659 mL 0   polyethylene glycol (MIRALAX / GLYCOLAX) 17 g packet Take 17 g by mouth daily as needed (constipation).     predniSONE (DELTASONE) 5 MG tablet Take 1 tablet (5 mg total) by mouth daily as needed (swelling, inflammation). 30 tablet 2   prochlorperazine (COMPAZINE) 10 MG tablet Take 1 tablet (10 mg total) by mouth every 6 (six) hours as needed for nausea or vomiting. 30 tablet 3   No current facility-administered medications for this visit.    SUMMARY OF ONCOLOGIC HISTORY: Oncology History  Tongue cancer ([place])  08/22/2011 Surgery   He had partial glossectomy which showed invasive SCC measured 1.3 cm   09/06/2011 Surgery   He underwent right neck dissection which showed 1/24 LN involved and repeat resection of tongue was negative   03/23/2012 Surgery   Repeat tngue resection showed well differentiated SCC 1.5 cm which invaded into the muscle   04/17/2012 Imaging   PET/CT scan showed New adenopathy in the left neck, compatible with recurrent malignancy   04/23/2012 Surgery   he underwent left neck LN dissection and 2/34 LN were positve   06/01/2012 - 07/13/2012 Chemotherapy   Patient had 3 cycles of cisplatin   06/12/2012 - 07/14/2012 Radiation Therapy   Patient completed RT   12/10/2012 Imaging   Interval resolution of previous hypermetabolic cervical adenopathy. There is nonspecific asymmetric increased uptake within the right side of tongue   03/13/2013 Imaging   PET/CT scan showed no evidence of disease recurrence. He was noted to have incidental kidney stones   08/15/2014 Imaging   CT scan of the dissection no evidence of disease   05/02/2020 Imaging   CT scan of neck 1. 4.3 x 2.7 x 2.7 cm peripherally enhancing mass at the anterior tongue base with focal  sclerosis of the anterior genu of the mandible. 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 2. 8 mm peripherally enhancing left level 2 lymph node is concerning for metastatic disease. Recommend PET scan of the neck to further characterize disease. 3. No other significant recurrent left-sided lymph nodes. 4. Post radiation changes of the carotid sheath bilaterally. 5. Post radiation changes at the lung apices bilaterally. 6. Polyps or mucous retention cysts within the maxillary sinuses bilaterally.   05/24/2020 Pathology Results   A. SUBMENTAL MASS, MIDLINE, NEEDLE CORE BIOPSY:  - Poorly differentiated sarcomatoid malignancy.   COMMENT:   CK7 is positive.  CKAE1/AE3, CK8/18, CK20, p40, CK5/6, S-100, Melan-A and Desmin are negative.  SMA demonstrates nonspecific staining.  Ki-67 proliferation index is high.  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     05/24/2020 Procedure   Successful ultrasound submental mass 18 gauge core biopsy   06/07/2020 PET scan   IMPRESSION: 1. Unfortunately evidence of squamous cell carcinoma recurrence in the floor of the mouth. Broad lesion with intense peripheral metabolic activity in the anterior RIGHT floor the mouth. 2. Bilateral hypermetabolic nodules within the sub submandibular which are new from PET-CT 2015. Concern for unusual  metastasis to the submandibular glands. 3. New small bilateral small pulmonary nodules with faint radiotracer activity. These are also concerning for metastasis. 4. Asymmetric hypermetabolic activity within the RIGHT testicle. Favor benign inflammation; however consider testicular ultrasound if concern for unusual pattern of metastasis.   06/15/2020 Procedure   Successful placement of a right internal jugular approach power injectable Port-A-Cath. The catheter is ready for immediate use.     06/19/2020 - 08/05/2020  Chemotherapy          08/21/2020 PET scan   1. Progressive tumor in the floor the mouth with overt permeative destructive changes involving the mandible. 2. Persistent hypermetabolic neck nodes as detailed above. No new findings. 3. Progressive pulmonary metastatic disease. 4. No findings for abdominal/pelvic metastatic disease. 5. Persistent hypermetabolism in the right hemiscrotum most likely due to a varicocele.     08/29/2020 - 12/06/2020 Chemotherapy   Patient is on Treatment Plan : HEAD/NECK Cisplatin + Docetaxel q21d plus cetuximab      08/31/2020 - 12/15/2020 Chemotherapy   Patient is on Treatment Plan : HEAD/NECK Cetuximab q7d     10/27/2020 Imaging   1. Allowing for differing technique, no change in bilateral solid pulmonary nodules. 2. Several ground-glass nodules in the RIGHT upper lobe and mild tree-in-bud pattern in the RIGHT lower lobe could indicate mild pulmonary infection. 3. No lymphadenopathy.     10/27/2020 Imaging   Considerable enlargement of tumor at the anterior tongue and floor of the mouth measuring approximately 6 x 5 x 3 cm, with areas of intratumoral necrosis, particularly anterior.   Considerable progression of lytic destructive involvement of the mandible, more extensive on the right side than the left.   Enlargement of a low-density malignant level 2 node on the left, 12 mm today compared with 7 mm in April.   12/12/2020 Procedure   Technically successful placement of 20 Fr gastrostomy tube with absorbable gastropexy sutures   12/25/2020 Imaging   CT neck 1. Mild enlargement of necrotic tumor involving the floor of mouth and anterior tongue with extension to the skin. 2. Mild progression of mandibular destruction with mildly displaced right and nondisplaced left mandibular body fractures. 3. Unchanged necrotic left level II lymph node.  4. Increased, large left mastoid effusion with a new left middle ear effusion.     12/26/2020 Imaging   1.  Multiple small bilateral pulmonary nodules are slightly enlarged compared to prior examination, consistent with worsened pulmonary metastatic disease. 2. 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   Metastasis to lung ([place])  06/09/2020 Initial Diagnosis   Metastasis to lung ([place])   08/31/2020 - 12/15/2020 Chemotherapy   Patient is on Treatment Plan : HEAD/NECK Cetuximab q7d       PHYSICAL EXAMINATION: ECOG PERFORMANCE STATUS: 1 - Symptomatic but completely ambulatory  Vitals:   12/29/20 1006  BP: 108/76  Pulse: (!) 114  Resp: 18  Temp: (!) 97.4 F (36.3 C)  SpO2: 100%   Filed Weights   12/29/20 1006  Weight: 187 lb (84.8 kg)    GENERAL:alert, no distress and comfortable SKIN: skin color, texture, turgor are normal, no rashes or significant lesions EYES: normal, Conjunctiva are pink and non-injected, sclera clear 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  NEURO: alert & oriented x 3 with fluent speech, no focal motor/sensory deficits  LABORATORY DATA:  I have reviewed the data as listed    Component Value Date/Time   NA 137 12/29/2020 0956   NA 141 04/29/2016 1402  K 3.9 12/29/2020 0956   K 4.1 04/29/2016 1402   CL 101 12/29/2020 0956   CL 100 07/10/2012 1341   CO2 27 12/29/2020 0956   CO2 25 04/29/2016 1402   GLUCOSE 130 (H) 12/29/2020 0956   GLUCOSE 91 04/29/2016 1402   GLUCOSE 118 (H) 07/10/2012 1341   BUN 12 12/29/2020 0956   BUN 9.7 04/29/2016 1402   CREATININE 0.80 12/29/2020 0956   CREATININE 1.0 04/29/2016 1402   CALCIUM 9.1 12/29/2020 0956   CALCIUM 9.3 04/29/2016 1402   PROT 7.0 12/29/2020 0956   PROT 7.0 04/29/2016 1402   ALBUMIN 3.5 12/29/2020 0956   ALBUMIN 4.1 04/29/2016 1402   AST 19 12/29/2020 0956   AST 28 04/29/2016 1402   ALT 17 12/29/2020 0956   ALT 26 04/29/2016 1402   ALKPHOS 95 12/29/2020 0956   ALKPHOS 71 04/29/2016 1402   BILITOT 0.3 12/29/2020 0956   BILITOT 0.43 04/29/2016 1402   GFRNONAA >60 12/29/2020 0956    GFRAA >60 05/02/2017 0901    No results found for: SPEP, UPEP  Lab Results  Component Value Date   WBC 8.8 12/29/2020   NEUTROABS 7.8 (H) 12/29/2020   HGB 10.7 (L) 12/29/2020   HCT 33.5 (L) 12/29/2020   MCV 88.6 12/29/2020   PLT 330 12/29/2020      Chemistry      Component Value Date/Time   NA 137 12/29/2020 0956   NA 141 04/29/2016 1402   K 3.9 12/29/2020 0956   K 4.1 04/29/2016 1402   CL 101 12/29/2020 0956   CL 100 07/10/2012 1341   CO2 27 12/29/2020 0956   CO2 25 04/29/2016 1402   BUN 12 12/29/2020 0956   BUN 9.7 04/29/2016 1402   CREATININE 0.80 12/29/2020 0956   CREATININE 1.0 04/29/2016 1402      Component Value Date/Time   CALCIUM 9.1 12/29/2020 0956   CALCIUM 9.3 04/29/2016 1402   ALKPHOS 95 12/29/2020 0956   ALKPHOS 71 04/29/2016 1402   AST 19 12/29/2020 0956   AST 28 04/29/2016 1402   ALT 17 12/29/2020 0956   ALT 26 04/29/2016 1402   BILITOT 0.3 12/29/2020 0956   BILITOT 0.43 04/29/2016 1402       RADIOGRAPHIC STUDIES: I have personally reviewed the radiological images as listed and agreed with the findings in the report. CT Abdomen Wo Contrast  Result Date: 12/07/2020 CLINICAL DATA:  Declining health Struggling to maintain oral intake with progressive weight loss CT obtained for evaluation for possible feeding tube placement EXAM: CT ABDOMEN WITHOUT CONTRAST TECHNIQUE: Multidetector CT imaging of the abdomen was performed following the standard protocol without IV contrast. COMPARISON:  PET-CT 08/18/2020 FINDINGS: Lower chest: 8 x 5 mm nodule in the right middle lobe is not significantly changed since prior PET-CT. Visualized lung bases are otherwise clear. Hepatobiliary: No focal liver abnormality is seen. No gallstones, gallbladder wall thickening, or biliary dilatation. Pancreas: Unremarkable. No pancreatic ductal dilatation or surrounding inflammatory changes. Spleen: Normal in size without focal abnormality. Adrenals/Urinary Tract: Adrenal glands  are normal in appearance. Multiple bilateral nonobstructing renal calculi with the largest measuring 8 mm, located on the right. Stomach/Bowel: No dilated loops of bowel within the visualized abdomen. Transfers colon is in close proximity to the stomach. Vascular/Lymphatic: No enlarged retroperitoneal or mesenteric lymph nodes. Other: No abdominal wall hernia or abnormality. Musculoskeletal: No acute or significant osseous findings. IMPRESSION: 1. Short segment of transverse colon is in close proximity to the stomach. It is possible that  this segment can be displaced by inflating the stomach, creating a safe percutaneous window for G-tube placement. The patient should be given oral contrast the night before gastrostomy tube is attempted by IR. 2. Nonobstructing bilateral renal calculi. Electronically Signed   By: Miachel Roux M.D.   On: 12/07/2020 16:09   CT SOFT TISSUE NECK W CONTRAST  Result Date: 12/27/2020 CLINICAL DATA:  Head and neck cancer. Infection with draining tract. EXAM: CT NECK WITH CONTRAST TECHNIQUE: Multidetector CT imaging of the neck was performed using the standard protocol following the bolus administration of intravenous contrast. CONTRAST:  100m OMNIPAQUE IOHEXOL 350 MG/ML SOLN COMPARISON:  10/25/2020 FINDINGS: Pharynx and larynx: Heterogeneously enhancing, partially necrotic and ulcerated mass involving the floor of mouth and anterior tongue has mildly enlarged, now measuring approximately 6.3 x 5.3 x 4.9 cm. There is increased tumor extension anterior and inferior to the mandible predominantly right of midline with extension to the skin. Salivary glands: Smaller absent submandibular glands. Unchanged subcentimeter bilateral parotid lymph nodes. Thyroid: Unremarkable. Lymph nodes: Unchanged 1.3 x 1.3 cm necrotic left level II lymph node (series 2, image 54). No evidence of new cervical lymphadenopathy. Vascular: Major vascular structures of the neck are patent. Carotid intimal thickening  likely related to radiation therapy. Limited intracranial: Unremarkable. Visualized orbits: Unremarkable. Mastoids and visualized paranasal sinuses: Small mucous retention cysts in the maxillary sinuses. Increased, large left mastoid effusion with new left middle ear effusion. Skeleton: Destructive changes of the mandible have mildly progressed with extensive areas of lucency and sclerosis involving the right mandibular body, parasymphyseal region, and anterior left mandibular body. There are mildly displaced right and nondisplaced left mandibular body fractures (series 8, image 46). Upper chest: Reported separately. Other: None. IMPRESSION: 1. Mild enlargement of necrotic tumor involving the floor of mouth and anterior tongue with extension to the skin. 2. Mild progression of mandibular destruction with mildly displaced right and nondisplaced left mandibular body fractures. 3. Unchanged necrotic left level II lymph node. 4. Increased, large left mastoid effusion with a new left middle ear effusion. Electronically Signed   By: ALogan BoresM.D.   On: 12/27/2020 12:07   CT CHEST W CONTRAST  Result Date: 12/26/2020 CLINICAL DATA:  Head and neck cancer, pulmonary metastatic disease, ongoing chemotherapy, assess treatment response EXAM: CT CHEST WITH CONTRAST TECHNIQUE: Multidetector CT imaging of the chest was performed during intravenous contrast administration. CONTRAST:  860mOMNIPAQUE IOHEXOL 350 MG/ML SOLN COMPARISON:  10/25/2020 FINDINGS: Cardiovascular: Right chest port catheter. Normal heart size. No pericardial effusion. Mediastinum/Nodes: No enlarged mediastinal, hilar, or axillary lymph nodes. Thyroid gland, trachea, and esophagus demonstrate no significant findings. Lungs/Pleura: Minimal radiation fibrosis of the anterior apices (series 7, image 21). Multiple small bilateral pulmonary nodules are slightly enlarged compared to prior examination, an index nodule of the medial right middle lobe measuring  1.3 x 1.1 cm previously 1.1 x 0.9 cm previously series 7, image 90), an additional index nodule of the posterior left upper lobe measuring 0.7 cm, previously 0.5 cm (series 7, image 72), an additional index nodule of the central right upper lobe measuring 0.6 cm, previously 0.3 cm (series 7, image 44). No pleural effusion or pneumothorax. Upper Abdomen: No acute abnormality.  Percutaneous gastrostomy tube. Musculoskeletal: No chest wall mass or suspicious bone lesions identified. IMPRESSION: 1. Multiple small bilateral pulmonary nodules are slightly enlarged compared to prior examination, consistent with worsened pulmonary metastatic disease. 2. 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 Electronically Signed   By: AlLanae Crumbly  Laqueta Carina M.D.   On: 12/26/2020 13:38   IR Gastrostomy Tube  Result Date: 12/12/2020 INDICATION: 40 year old male with history of tongue cancer, dysphagia, and failure to thrive. Gastrostomy tube requested for supplemental nutrition. EXAM: PERC PLACEMENT GASTROSTOMY; IR CT SPINE LIMITED MEDICATIONS: Ancef 2 gm IV; Antibiotics were administered within 1 hour of the procedure. ANESTHESIA/SEDATION: Versed 4 mg IV; Fentanyl 150 mcg IV Moderate Sedation Time:  28 The patient was continuously monitored during the procedure by the interventional radiology nurse under my direct supervision. CONTRAST:  85m OMNIPAQUE IOHEXOL 300 MG/ML SOLN - administered into the gastric lumen. FLUOROSCOPY TIME:  Fluoroscopy Time: 1.5 minutes (74 mGy). COMPLICATIONS: None immediate. PROCEDURE: Informed written consent was obtained from the patient after a thorough discussion of the procedural risks, benefits and alternatives. All questions were addressed. Maximal Sterile barrier Technique was utilized including caps, mask, sterile gowns, sterile gloves, sterile drape, hand hygiene and skin antiseptic. A timeout was performed prior to the initiation of the procedure. The patient was placed on the procedure  table in the supine position. Pre-procedure abdominal film confirmed visualization of the transverse colon. An angled 5-French catheter was passed through the nares into the stomach. The patient was prepped and draped in usual sterile fashion. The stomach was insufflated with air via the indwelling nasogastric tube. Under fluoroscopy, a puncture site was selected and local analgesia achieved with 1% lidocaine infiltrated subcutaneously. Under fluoroscopic guidance, a gastropexy needle was passed into the stomach and the T-bar suture was released. Entry into the stomach was confirmed with fluoroscopy, aspiration of air, and injection of contrast material. This was repeated with an additional gastropexy suture (for a total of 2 fasteners). At the center of these gastropexy sutures, a dermatotomy was performed. An 18 gauge needle was passed into the stomach at the site of this dermatotomy, and position within the gastric lumen again confirmed under fluoroscopy using aspiration of air and contrast injection. Injection of contrast demonstrated brisk flow into what appeared to be a loop of neighboring small bowel. The needle was removed. Given small access window, cone beam CT was then performed. Cone beam CT demonstrated gaseous distension of the stomach which is well apposed to the anterior abdominal wall without any intervening loops of small or large bowel. An 18 gauge needle was passed into the stomach at the site of this dermatotomy, and position within the gastric lumen again confirmed under fluoroscopy using aspiration of air and contrast injection. An Amplatz guidewire was passed through this needle and intraluminal placement within the stomach was confirmed by fluoroscopy. The needle was removed. Over the guidewire, the percutaneous tract was dilated using a 10 mm non-compliant balloon. The balloon was deflated, then pushed into the gastric lumen followed in concert by the 20 Fr gastrostomy tube. The retention  balloon of the percutaneous gastrostomy tube was inflated with 10 mL of sterile water. The tube was withdrawn until the retention balloon was at the edge of the gastric lumen. The external bumper was brought to the abdominal wall. Contrast was injected through the gastrostomy tube, confirming intraluminal positioning. The patient tolerated the procedure well without any immediate post-procedural complications. IMPRESSION: Technically successful placement of 20 Fr gastrostomy tube with absorbable gastropexy sutures. DRuthann Cancer MD [facility]ad[facility] CenterRadiology Electronically Signed   By: DRuthann CancerM.D.   On: 12/12/2020 10:57   IR CT [facility]  Result Date: 12/12/2020 INDICATION: 40year old male with history of tongue cancer, dysphagia, and failure to thrive. Gastrostomy tube  requested for supplemental nutrition. EXAM: PERC PLACEMENT GASTROSTOMY; IR CT SPINE LIMITED MEDICATIONS: Ancef 2 gm IV; Antibiotics were administered within 1 hour of the procedure. ANESTHESIA/SEDATION: Versed 4 mg IV; Fentanyl 150 mcg IV Moderate Sedation Time:  28 The patient was continuously monitored during the procedure by the interventional radiology nurse under my direct supervision. CONTRAST:  27m OMNIPAQUE IOHEXOL 300 MG/ML SOLN - administered into the gastric lumen. FLUOROSCOPY TIME:  Fluoroscopy Time: 1.5 minutes (74 mGy). COMPLICATIONS: None immediate. PROCEDURE: Informed written consent was obtained from the patient after a thorough discussion of the procedural risks, benefits and alternatives. All questions were addressed. Maximal Sterile barrier Technique was utilized including caps, mask, sterile gowns, sterile gloves, sterile drape, hand hygiene and skin antiseptic. A timeout was performed prior to the initiation of the procedure. The patient was placed on the procedure table in the supine position. Pre-procedure abdominal film confirmed visualization of the transverse colon.  An angled 5-French catheter was passed through the nares into the stomach. The patient was prepped and draped in usual sterile fashion. The stomach was insufflated with air via the indwelling nasogastric tube. Under fluoroscopy, a puncture site was selected and local analgesia achieved with 1% lidocaine infiltrated subcutaneously. Under fluoroscopic guidance, a gastropexy needle was passed into the stomach and the T-bar suture was released. Entry into the stomach was confirmed with fluoroscopy, aspiration of air, and injection of contrast material. This was repeated with an additional gastropexy suture (for a total of 2 fasteners). At the center of these gastropexy sutures, a dermatotomy was performed. An 18 gauge needle was passed into the stomach at the site of this dermatotomy, and position within the gastric lumen again confirmed under fluoroscopy using aspiration of air and contrast injection. Injection of contrast demonstrated brisk flow into what appeared to be a loop of neighboring small bowel. The needle was removed. Given small access window, cone beam CT was then performed. Cone beam CT demonstrated gaseous distension of the stomach which is well apposed to the anterior abdominal wall without any intervening loops of small or large bowel. An 18 gauge needle was passed into the stomach at the site of this dermatotomy, and position within the gastric lumen again confirmed under fluoroscopy using aspiration of air and contrast injection. An Amplatz guidewire was passed through this needle and intraluminal placement within the stomach was confirmed by fluoroscopy. The needle was removed. Over the guidewire, the percutaneous tract was dilated using a 10 mm non-compliant balloon. The balloon was deflated, then pushed into the gastric lumen followed in concert by the 20 Fr gastrostomy tube. The retention balloon of the percutaneous gastrostomy tube was inflated with 10 mL of sterile water. The tube was withdrawn  until the retention balloon was at the edge of the gastric lumen. The external bumper was brought to the abdominal wall. Contrast was injected through the gastrostomy tube, confirming intraluminal positioning. The patient tolerated the procedure well without any immediate post-procedural complications. IMPRESSION: Technically successful placement of 20 Fr gastrostomy tube with absorbable gastropexy sutures. DRuthann Cancer MD [facility] Electronically Signed   By: DRuthann CancerM.D.   On: 12/12/2020 10:57

## 2020-12-29 NOTE — Assessment & Plan Note (Signed)
He has chronic infection and mucositis We will refill Magic mouthwash He responded well to aggressive antibiotics He will continue doxycycline and wound care

## 2020-12-29 NOTE — Assessment & Plan Note (Signed)
He is gaining weight He will continue dietitian review and nutritional supplement as needed

## 2020-12-29 NOTE — Assessment & Plan Note (Signed)
We have extensive discussions about goals of care Without further treatment, we can continue to provide symptom management and supportive care He agreed for palliative care referral We discussed prognosis will be less than 6 months

## 2020-12-29 NOTE — Assessment & Plan Note (Signed)
His pain is well controlled He will continue the same regimen

## 2020-12-29 NOTE — Assessment & Plan Note (Signed)
Unfortunately, his recent CT imaging show progression The patient has progressed on carboplatin, 5-FU, pembrolizumab, cisplatin, paclitaxel as well as cetuximab He is not a candidate for surgery or further radiation We discussed the risk and benefits of second opinion Due to his ongoing oral infection, I doubt he is a candidate for any clinical trial Ultimately, he agreed for palliative care referral as well as supportive care I will see him on a monthly basis for symptom management

## 2021-01-02 ENCOUNTER — Telehealth: Payer: Self-pay | Admitting: Nurse Practitioner

## 2021-01-02 ENCOUNTER — Ambulatory Visit: Payer: BC Managed Care – PPO | Admitting: Internal Medicine

## 2021-01-02 ENCOUNTER — Telehealth: Payer: Self-pay

## 2021-01-02 NOTE — Telephone Encounter (Signed)
Called back. He is requesting a disc with imaging to take to appt for 2 nd opinion. Called radiology and spoke with [facility]. He can pick up disc on Friday at admitting office at [facility] before  5 pm. He verbalized understanding.

## 2021-01-02 NOTE — Telephone Encounter (Signed)
Spoke with patient's wife Mendel Ryder, regarding the Palliative referral/services and all questions were answered and she was in agreement with scheduling visit.  I have scheduled a [place] for 01/16/21 @ 11 AM.

## 2021-01-03 ENCOUNTER — Other Ambulatory Visit (HOSPITAL_COMMUNITY): Payer: Self-pay

## 2021-01-04 ENCOUNTER — Other Ambulatory Visit (HOSPITAL_COMMUNITY): Payer: Self-pay

## 2021-01-05 ENCOUNTER — Other Ambulatory Visit (HOSPITAL_COMMUNITY): Payer: Self-pay

## 2021-01-16 ENCOUNTER — Telehealth: Payer: Self-pay | Admitting: Nurse Practitioner

## 2021-01-16 ENCOUNTER — Other Ambulatory Visit: Payer: Self-pay | Admitting: Nurse Practitioner

## 2021-01-16 NOTE — Telephone Encounter (Signed)
I called [name] as it appeared that Mr Horn had a telemedicine visit initial PC visit scheduled on outlook calendar; [name] endorses the visit was moved to inperson for 01/18/2021. [name] endorses she wishes to d/c from pc as [name] is receiving treatment in [place] where he currently is at. Notified Palliative Admit.

## 2021-01-18 ENCOUNTER — Telehealth: Payer: Self-pay

## 2021-01-18 ENCOUNTER — Other Ambulatory Visit: Payer: Self-pay | Admitting: Nurse Practitioner

## 2021-01-18 NOTE — Telephone Encounter (Signed)
He called and left a message. He appreciates all the care from Dr. Alvy Bimler and [facility]. He is going to do a clinical trail at [facility].

## 2021-01-18 NOTE — Telephone Encounter (Signed)
Called and left a message asking him to call the office back. Calling to see what his plan is after going to [facility].

## 2021-01-24 NOTE — Telephone Encounter (Signed)
Is it ok to cancel appt on 1/6?

## 2021-01-24 NOTE — Telephone Encounter (Signed)
Called back. He wants to keep appts as scheduled on 1/6. He will be going back to [facility] for treatment.  He is needs a refill on the Prednisone, Magic Mouth wash, Morphine and MS Contin tomorrow. He ask that you send it to [facility] outpatient pharmacy.

## 2021-01-24 NOTE — Telephone Encounter (Signed)
Called and left a message asking him to call the office back. 

## 2021-01-25 ENCOUNTER — Other Ambulatory Visit: Payer: Self-pay | Admitting: Hematology and Oncology

## 2021-01-25 ENCOUNTER — Other Ambulatory Visit: Payer: Self-pay

## 2021-01-25 ENCOUNTER — Other Ambulatory Visit (HOSPITAL_COMMUNITY): Payer: Self-pay

## 2021-01-25 ENCOUNTER — Telehealth: Payer: Self-pay

## 2021-01-25 DIAGNOSIS — K122 Cellulitis and abscess of mouth: Secondary | ICD-10-CM

## 2021-01-25 DIAGNOSIS — C029 Malignant neoplasm of tongue, unspecified: Secondary | ICD-10-CM

## 2021-01-25 MED ORDER — MORPHINE SULFATE 30 MG PO TABS
30.0000 mg | ORAL_TABLET | ORAL | 0 refills | Status: DC | PRN
Start: 2021-01-25 — End: 2021-03-09
  Filled 2021-01-25: qty 90, 15d supply, fill #0

## 2021-01-25 MED ORDER — PREDNISONE 5 MG PO TABS
5.0000 mg | ORAL_TABLET | Freq: Every day | ORAL | 2 refills | Status: DC | PRN
  Filled 2021-01-25: qty 30, 30d supply, fill #0

## 2021-01-25 MED ORDER — MORPHINE SULFATE ER 60 MG PO TBCR
60.0000 mg | EXTENDED_RELEASE_TABLET | Freq: Three times a day (TID) | ORAL | 0 refills | Status: DC
Start: 2021-01-25 — End: 2021-04-02
  Filled 2021-01-25: qty 90, 30d supply, fill #0

## 2021-01-25 MED ORDER — NYSTATIN 100000 UNIT/ML MT SUSP
OROMUCOSAL | 0 refills | Status: AC
  Filled 2021-01-25: qty 240, 12d supply, fill #0

## 2021-01-25 MED ORDER — AMOXICILLIN-POT CLAVULANATE 875-125 MG PO TABS
1.0000 | ORAL_TABLET | Freq: Two times a day (BID) | ORAL | 0 refills | Status: DC
  Filled 2021-01-25: qty 14, 7d supply, fill #0

## 2021-01-25 NOTE — Telephone Encounter (Signed)
Pls call in MMW, I refilled the rest to [facility]

## 2021-01-25 NOTE — Telephone Encounter (Signed)
I sent prescription Augmentin to [facility]

## 2021-01-25 NOTE — Telephone Encounter (Signed)
Called and left a message Rx's sent to pharmacy. Ask him to call the office back if needed.

## 2021-01-25 NOTE — Telephone Encounter (Signed)
Called back and told the swelling to his face and foul smelling spit is from the cancer. He verbalized understanding. He thinks that the swelling may be from sleeping on his right side the las 2 days. He would like a antibiotic for the foul odor.  His next appt at [facility] is 1/17. Instructed to call [facility] to see if he can can have a earlier appt. Her verbalized understanding. He appreciated the call.

## 2021-01-25 NOTE — Telephone Encounter (Signed)
Returned his call. For the last 2 days he has had right sided facial/ lip swelling. Foul odor x 2 days from spit.

## 2021-01-26 ENCOUNTER — Other Ambulatory Visit (HOSPITAL_COMMUNITY): Payer: Self-pay

## 2021-01-29 ENCOUNTER — Other Ambulatory Visit (HOSPITAL_COMMUNITY): Payer: Self-pay

## 2021-01-30 ENCOUNTER — Other Ambulatory Visit (HOSPITAL_COMMUNITY): Payer: Self-pay

## 2021-01-30 ENCOUNTER — Encounter: Payer: Self-pay | Admitting: Hematology and Oncology

## 2021-01-30 ENCOUNTER — Other Ambulatory Visit: Payer: Self-pay | Admitting: Hematology and Oncology

## 2021-01-30 ENCOUNTER — Telehealth: Payer: Self-pay

## 2021-01-30 ENCOUNTER — Telehealth: Payer: Self-pay | Admitting: *Deleted

## 2021-01-30 NOTE — Telephone Encounter (Signed)
Patient notified of Prior Authorization approval for Quantity Limit Override for Morphine Sulfate 30mg  Tablets. Medication is approved for #90 tablets for a 15 day supply and is approved through 01/30/2022

## 2021-01-30 NOTE — Telephone Encounter (Signed)
[id number] LVM for patient and wife asking if they would be willing to complete the 6 months questionnaires for the study when they come in for appointment this week on 02/01/21.  Asked them to call back if any questions or if they do not want to complete the questionnaires. Otherwise research nurse will plan to provide the questionnaires to them when they check in for MD visit later this week.  Foye Spurling, BSN, RN, [employer] Clinical Research Nurse 01/30/2021 10:18 AM

## 2021-01-31 ENCOUNTER — Ambulatory Visit (INDEPENDENT_AMBULATORY_CARE_PROVIDER_SITE_OTHER): Payer: BC Managed Care – PPO | Admitting: Internal Medicine

## 2021-01-31 ENCOUNTER — Encounter: Payer: Self-pay | Admitting: Hematology and Oncology

## 2021-01-31 ENCOUNTER — Other Ambulatory Visit: Payer: Self-pay

## 2021-01-31 VITALS — BP 106/77 | HR 107 | Resp 16 | Ht 70.0 in | Wt 191.6 lb

## 2021-01-31 DIAGNOSIS — C760 Malignant neoplasm of head, face and neck: Secondary | ICD-10-CM | POA: Diagnosis not present

## 2021-01-31 NOTE — Patient Instructions (Signed)
Stop amoxicillin-clavulonate antibiotics  Follow up with your oncology team as discussed with them.

## 2021-01-31 NOTE — Progress Notes (Signed)
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   Dehydration 12/06/2020   Hypomagnesemia 10/12/2020   Generalized weakness 09/21/2020   Nausea without vomiting 09/21/2020   Yeast infection 09/07/2020   Weight loss, abnormal 09/07/2020   Elevated liver enzymes 09/07/2020   Drug-induced skin rash 08/31/2020   Tooth loose 08/21/2020   Other constipation 08/01/2020   Acute gout 07/11/2020   Mucositis due to antineoplastic therapy 06/27/2020   Thrush, oral 06/27/2020   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 There is scabbing over the sinus tract area. Redness is progressing -- there is also new lip swelling and a sinus tract (scabbed over) on left side of his jaw No fever/chill No diarrhea Saw dr Alvy Bimler a few days ago -- will be referred to [facility]     I saw patient just more than 1 month ago --------- I reviewed chart Patient able to speak but due to cancer I am not able to make out much of what he is trying to  see  He appears to have had this redness below his jaw and sinus draininage since 1st week of October. Clindamycin then bactrim rx since 10/21 and referred to id clinic  No f/c Tender when he opens his mouth Able to eat liquid only Has peg tube as well for extra nutrition  Cancer -- mets in lung -- recurrent/mets dx'ed 05/2020  Review of Systems: ROS All other ros negative     Past Medical History:  Diagnosis Date   Bradycardia 03/16/2014   Dry mouth    radiation   GERD (gastroesophageal reflux disease)    occ   Gout    Head and neck cancer (HCC)    tongue cancer   High triglycerides    History of kidney stones    History of radiation therapy 06/02/12- 07/14/12   oral tongue and bilateral neck, 60 gray in 30 fx   Hypothyroidism    radiation- not working   PONV (postoperative nausea and vomiting)    tongue swelled-emergency trach   Ringing in ears    Staph skin infection    Elbow- resolved.     Stones in the urinary tract    Tongue cancer ([place])    Xerostomia 11/30/2012    Social History   Tobacco Use   Smoking status: Former    Types: Cigars    Quit date: 08/14/2011    Years since quitting: 9.4   Smokeless tobacco: Never  Vaping Use   Vaping  Use: Never used  Substance Use Topics   Alcohol use: No    Comment: quit drinking 7/13 from 12 pack of beer on weekends   Drug use: No    Family History  Problem Relation Age of Onset   Rheum arthritis Mother    Asthma Father    Cancer Maternal Grandmother    Diabetes Sister    Diabetes Brother    Colon cancer Neg Hx     Allergies  Allergen Reactions   Cetuximab Hives    Hypersensitivity protocol utilized. Symptoms quickly resolved. Drug resumed and completed without further incident.     OBJECTIVE: Vitals:   01/31/21 1104  BP: 106/77  Pulse: (!) 107  Resp: 16  SpO2: 99%  Weight: 191 lb 9.6 oz (86.9 kg)  Height: 5\' 10"  (1.778 m)   Body mass index is 27.49 kg/m.   Physical  Exam General/constitutional: no distress, pleasant HEENT: see pictures below -- new swelling/spreading redness-inflammation Neuro: nonfocal Lungs: normal respiratory effort Abd: s/nt  01/31/21         11/21        Central line presence: right chest port site no erythema/purulence  Lab: Lab Results  Component Value Date   WBC 8.8 12/29/2020   HGB 10.7 (L) 12/29/2020   HCT 33.5 (L) 12/29/2020   MCV 88.6 12/29/2020   PLT 330 63/89/3734   Last metabolic panel Lab Results  Component Value Date   GLUCOSE 130 (H) 12/29/2020   NA 137 12/29/2020   K 3.9 12/29/2020   CL 101 12/29/2020   CO2 27 12/29/2020   BUN 12 12/29/2020   CREATININE 0.80 12/29/2020   GFRNONAA >60 12/29/2020   CALCIUM 9.1 12/29/2020   PROT 7.0 12/29/2020   ALBUMIN 3.5 12/29/2020   BILITOT 0.3 12/29/2020   ALKPHOS 95 12/29/2020   AST 19 12/29/2020   ALT 17 12/29/2020   ANIONGAP 9 12/29/2020    Microbiology:  Serology:  Imaging: Reviewed 09/2018 ct neck  Assessment/plan: Problem List Items Addressed This Visit   None Visit Diagnoses     Head and neck cancer ([place])    -  Primary   Relevant Medications   predniSONE (DELTASONE) 5 MG tablet        Multifactorial but agree concerning changes for infectious process as well, potentially jaw involvement. Actinomyces a high consideration, so would like ent to evaluate and biopsy/culture for treatment planning with abx as if that is present would require initial iv penicillin and prolonged 6 months at least beta-lactam therapy  Possibly will need to have oncologic team review and see if chemo might need to be temporarily hold while getting infection cleaned/treated  01/31/21 id assessment I had previously discussed with dr Alvy Bimler who suspect that this is all malignant related. Despite empiric abx process seems to be worse, so I agree with dr Alvy Bimler  At this time would stop antibiotics to avoid side effect and resistance issue. He is  referred to [facility] for second opinion of cancer treatment   -no need for id f/u at this time -stop augmentin -f/u oncology team  I have spent a total of 20 minutes of face-to-face and non-face-to-face time, excluding clinical staff time, preparing to see patient, ordering tests and/or medications, and provide counseling the patient     Follow-up: No follow-ups on file.  Jabier Mutton, [place] for [place] [phone] pager   [phone] cell 01/31/2021, 11:14 AM

## 2021-02-02 ENCOUNTER — Encounter: Payer: Self-pay | Admitting: *Deleted

## 2021-02-02 ENCOUNTER — Other Ambulatory Visit: Payer: Self-pay

## 2021-02-02 ENCOUNTER — Encounter: Payer: Self-pay | Admitting: Hematology and Oncology

## 2021-02-02 ENCOUNTER — Inpatient Hospital Stay: Payer: BC Managed Care – PPO | Attending: Hematology and Oncology | Admitting: Hematology and Oncology

## 2021-02-02 DIAGNOSIS — G893 Neoplasm related pain (acute) (chronic): Secondary | ICD-10-CM | POA: Diagnosis not present

## 2021-02-02 DIAGNOSIS — C029 Malignant neoplasm of tongue, unspecified: Secondary | ICD-10-CM | POA: Insufficient documentation

## 2021-02-02 DIAGNOSIS — Z7189 Other specified counseling: Secondary | ICD-10-CM

## 2021-02-02 DIAGNOSIS — K122 Cellulitis and abscess of mouth: Secondary | ICD-10-CM | POA: Insufficient documentation

## 2021-02-02 NOTE — Research (Signed)
[id number] Study: Met with patient and wife, Mendel Ryder, briefly prior to their visit with Dr. Alvy Bimler. Provided the 6 months study questionnaires for them to complete in clinic or to take home and mail back.  Mendel Ryder states they would rather take them home to complete. Gave pre paid addressed envelopes to mail back completed questionnaires. Informed them the next and last questionnaires are due in 6 more months. Thanked them for their time and ongoing participation in this study. Conley Rolls my card and encouraged her to call if any questions. She verbalized understanding.  Foye Spurling, BSN, RN, [employer] Clinical Research Nurse 02/02/2021 10:14 AM

## 2021-02-02 NOTE — Assessment & Plan Note (Signed)
We had numerous discussions about goals of care From our previous visit, he was referred for palliative care Due to his plan for active clinical trial, he is no longer a candidate for palliative care/hospice We discussed prognosis with or without treatment

## 2021-02-02 NOTE — Assessment & Plan Note (Signed)
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

## 2021-02-02 NOTE — Assessment & Plan Note (Signed)
His pain is well controlled He will continue the same regimen

## 2021-02-02 NOTE — Assessment & Plan Note (Signed)
He has developed oral fistula and recurrent signs of infection at the floor of the mouth I recommend he continues taking antibiotics and focus on wound care He is aware that chemotherapy treatment will make his wound more susceptible to infection

## 2021-02-02 NOTE — Progress Notes (Signed)
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 I have reviewed his case with the physician from [facility] The patient is getting enrolled in phase 1 clinical trial starting around January 17 We discussed other treatment options based on NCCN guidelines I will continue to provide supportive care as needed  Cancer associated pain His pain is well controlled He will continue the same regimen  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   All questions were answered. The patient knows to call the clinic with any problems, questions or concerns. The total time spent in the appointment was 30 minutes encounter with patients including review of chart and various tests results, discussions about plan of care and coordination of care plan   Heath Lark, MD 02/02/2021 4:54 PM  INTERVAL HISTORY: Please see below for problem oriented charting. he returns for treatment follow-up with his  wife He is planning to get enrolled in phase 1 clinical trial at [facility] I shared with him discussion with his oncologist there His pain is well controlled He is gaining weight and able to tolerate nutritional feeding tube and some oral intake Antibiotics is helping his wound care  REVIEW OF SYSTEMS:   Constitutional: Denies fevers, chills or abnormal weight loss Eyes: Denies blurriness of vision Ears, nose, mouth, throat, and face: Denies mucositis or sore throat Respiratory: Denies cough, dyspnea or wheezes Cardiovascular: Denies palpitation, chest discomfort or lower extremity swelling Gastrointestinal:  Denies nausea, heartburn or change in bowel habits Skin: Denies abnormal skin rashes Lymphatics: Denies new lymphadenopathy or easy bruising Neurological:Denies numbness, tingling or new weaknesses Behavioral/Psych: Mood is stable, no new changes  All other systems were reviewed with the patient and are negative.  I have reviewed the past medical history, past surgical history, social history and family history with the patient and they are unchanged from previous note.  ALLERGIES:  is allergic to cetuximab.  MEDICATIONS:  Current Outpatient Medications  Medication Sig Dispense Refill   amoxicillin-clavulanate (AUGMENTIN) 875-125 MG tablet Take 1 tablet by mouth 2 (two) times daily. 14 tablet 0   Docusate Sodium 100 MG/5ML ENEM as needed.     doxycycline (VIBRA-TABS) 100 MG tablet Take 1 tablet (100 mg total) by mouth 2 (two) times daily. (Patient not taking: Reported on 01/31/2021) 60 tablet 3   loratadine (CLARITIN) 10 MG tablet Take 10 mg by mouth daily as needed for allergies. (Patient not taking: Reported on 01/31/2021)     magic mouthwash (nystatin, lidocaine, diphenhydrAMINE, alum & mag hydroxide) suspension Use 5 MLs 4 times a day to swish and  spit 240 mL 0   morphine (MS CONTIN) 60 MG 12 hr tablet Take 1 tablet by mouth in the morning, at noon, and at bedtime. 90  tablet 0   morphine (MSIR) 30 MG tablet Take 1 tablet by mouth every 4  hours as needed for severe pain. 90 tablet 0   Nutritional Supplements (FEEDING SUPPLEMENT, OSMOLITE 1.5 CAL,) LIQD 7 cartons Osmolite 1.5 split over four feedings/day. Flush tube with 180 ml water QID. Drink by mouth or give via tube additional 2 1/2 cups fluid daily. This provides  2485 kcal, 104.3 g protein, 2581 ml total water. 1659 mL 0   polyethylene glycol powder (GLYCOLAX/MIRALAX) 17 GM/SCOOP powder Take by mouth.     predniSONE (DELTASONE) 5 MG tablet Take by mouth. (Patient not taking: Reported on 01/31/2021)     prochlorperazine (COMPAZINE) 10 MG tablet Take 1 tablet (10 mg total) by mouth every 6 (six) hours as needed for nausea or vomiting. (Patient not taking: Reported on 01/31/2021) 30 tablet 3   No current facility-administered medications for this visit.    SUMMARY OF ONCOLOGIC HISTORY: Oncology History  Tongue cancer ([place])  08/22/2011 Surgery   He had partial glossectomy which showed invasive SCC measured 1.3 cm   09/06/2011 Surgery   He underwent right neck dissection which showed 1/24 LN involved and repeat resection of tongue was negative   03/23/2012 Surgery   Repeat tngue resection showed well differentiated SCC 1.5 cm which invaded into the muscle   04/17/2012 Imaging   PET/CT scan showed New adenopathy in the left neck, compatible with recurrent malignancy   04/23/2012 Surgery   he underwent left neck LN dissection and 2/34 LN were positve   06/01/2012 - 07/13/2012 Chemotherapy   Patient had 3 cycles of cisplatin   06/12/2012 - 07/14/2012 Radiation Therapy   Patient completed RT   12/10/2012 Imaging   Interval resolution of previous hypermetabolic cervical adenopathy. There is nonspecific asymmetric increased uptake within the right side of tongue   03/13/2013 Imaging   PET/CT scan showed no evidence of disease recurrence. He was noted to have incidental kidney stones   08/15/2014 Imaging   CT  scan of the dissection no evidence of disease   05/02/2020 Imaging   CT scan of neck 1. 4.3 x 2.7 x 2.7 cm peripherally enhancing mass at the anterior tongue base with focal sclerosis of the anterior genu of the mandible. 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 2. 8 mm peripherally enhancing left level 2 lymph node is concerning for metastatic disease. Recommend PET scan of the neck to further characterize disease. 3. No other significant recurrent left-sided lymph nodes. 4. Post radiation changes of the carotid sheath bilaterally. 5. Post radiation changes at the lung apices bilaterally. 6. Polyps or mucous retention cysts within the maxillary sinuses bilaterally.   05/24/2020 Pathology Results   A. SUBMENTAL MASS, MIDLINE, NEEDLE CORE BIOPSY:  - Poorly differentiated sarcomatoid malignancy.   COMMENT:   CK7 is positive.  CKAE1/AE3, CK8/18, CK20, p40, CK5/6, S-100, Melan-A and Desmin are negative.  SMA demonstrates nonspecific staining.  Ki-67 proliferation index is high.  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     05/24/2020 Procedure   Successful ultrasound submental mass 18 gauge core biopsy   06/07/2020 PET scan   IMPRESSION: 1. Unfortunately evidence of squamous cell carcinoma recurrence in the floor of the mouth. Broad lesion with intense peripheral metabolic  activity in the anterior RIGHT floor the mouth. 2. Bilateral hypermetabolic nodules within the sub submandibular which are new from PET-CT 2015. Concern for unusual metastasis to the submandibular glands. 3. New small bilateral small pulmonary nodules with faint radiotracer activity. These are also concerning for metastasis. 4. Asymmetric hypermetabolic activity within the RIGHT testicle. Favor benign inflammation; however consider testicular ultrasound if concern for unusual pattern of metastasis.   06/15/2020  Procedure   Successful placement of a right internal jugular approach power injectable Port-A-Cath. The catheter is ready for immediate use.     06/19/2020 - 08/05/2020 Chemotherapy          08/21/2020 PET scan   1. Progressive tumor in the floor the mouth with overt permeative destructive changes involving the mandible. 2. Persistent hypermetabolic neck nodes as detailed above. No new findings. 3. Progressive pulmonary metastatic disease. 4. No findings for abdominal/pelvic metastatic disease. 5. Persistent hypermetabolism in the right hemiscrotum most likely due to a varicocele.     08/29/2020 - 12/06/2020 Chemotherapy   Patient is on Treatment Plan : HEAD/NECK Cisplatin + Docetaxel q21d plus cetuximab      08/31/2020 - 12/15/2020 Chemotherapy   Patient is on Treatment Plan : HEAD/NECK Cetuximab q7d     10/27/2020 Imaging   1. Allowing for differing technique, no change in bilateral solid pulmonary nodules. 2. Several ground-glass nodules in the RIGHT upper lobe and mild tree-in-bud pattern in the RIGHT lower lobe could indicate mild pulmonary infection. 3. No lymphadenopathy.     10/27/2020 Imaging   Considerable enlargement of tumor at the anterior tongue and floor of the mouth measuring approximately 6 x 5 x 3 cm, with areas of intratumoral necrosis, particularly anterior.   Considerable progression of lytic destructive involvement of the mandible, more extensive on the right side than the left.   Enlargement of a low-density malignant level 2 node on the left, 12 mm today compared with 7 mm in April.   12/12/2020 Procedure   Technically successful placement of 20 Fr gastrostomy tube with absorbable gastropexy sutures   12/25/2020 Imaging   CT neck 1. Mild enlargement of necrotic tumor involving the floor of mouth and anterior tongue with extension to the skin. 2. Mild progression of mandibular destruction with mildly displaced right and nondisplaced left mandibular body  fractures. 3. Unchanged necrotic left level II lymph node.  4. Increased, large left mastoid effusion with a new left middle ear effusion.     12/26/2020 Imaging   1. Multiple small bilateral pulmonary nodules are slightly enlarged compared to prior examination, consistent with worsened pulmonary metastatic disease. 2. 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   Metastasis to lung ([place])  06/09/2020 Initial Diagnosis   Metastasis to lung ([place])   08/31/2020 - 12/15/2020 Chemotherapy   Patient is on Treatment Plan : HEAD/NECK Cetuximab q7d       PHYSICAL EXAMINATION: ECOG PERFORMANCE STATUS: 1 - Symptomatic but completely ambulatory  Vitals:   02/02/21 0959  BP: (!) 124/95  Pulse: (!) 134  Resp: 18  Temp: (!) 97.5 F (36.4 C)  SpO2: 100%   Filed Weights   02/02/21 0959  Weight: 190 lb 9.6 oz (86.5 kg)    GENERAL:alert, no distress and comfortable OROPHARYNX: He has persistent fistula NEURO: alert & oriented x 3, no focal motor/sensory deficits  LABORATORY DATA:  I have reviewed the data as listed    Component Value Date/Time   NA 137 12/29/2020 0956   NA 141 04/29/2016 1402  K 3.9 12/29/2020 0956   K 4.1 04/29/2016 1402   CL 101 12/29/2020 0956   CL 100 07/10/2012 1341   CO2 27 12/29/2020 0956   CO2 25 04/29/2016 1402   GLUCOSE 130 (H) 12/29/2020 0956   GLUCOSE 91 04/29/2016 1402   GLUCOSE 118 (H) 07/10/2012 1341   BUN 12 12/29/2020 0956   BUN 9.7 04/29/2016 1402   CREATININE 0.80 12/29/2020 0956   CREATININE 1.0 04/29/2016 1402   CALCIUM 9.1 12/29/2020 0956   CALCIUM 9.3 04/29/2016 1402   PROT 7.0 12/29/2020 0956   PROT 7.0 04/29/2016 1402   ALBUMIN 3.5 12/29/2020 0956   ALBUMIN 4.1 04/29/2016 1402   AST 19 12/29/2020 0956   AST 28 04/29/2016 1402   ALT 17 12/29/2020 0956   ALT 26 04/29/2016 1402   ALKPHOS 95 12/29/2020 0956   ALKPHOS 71 04/29/2016 1402   BILITOT 0.3 12/29/2020 0956   BILITOT 0.43 04/29/2016 1402   GFRNONAA >60  12/29/2020 0956   GFRAA >60 05/02/2017 0901    No results found for: SPEP, UPEP  Lab Results  Component Value Date   WBC 8.8 12/29/2020   NEUTROABS 7.8 (H) 12/29/2020   HGB 10.7 (L) 12/29/2020   HCT 33.5 (L) 12/29/2020   MCV 88.6 12/29/2020   PLT 330 12/29/2020      Chemistry      Component Value Date/Time   NA 137 12/29/2020 0956   NA 141 04/29/2016 1402   K 3.9 12/29/2020 0956   K 4.1 04/29/2016 1402   CL 101 12/29/2020 0956   CL 100 07/10/2012 1341   CO2 27 12/29/2020 0956   CO2 25 04/29/2016 1402   BUN 12 12/29/2020 0956   BUN 9.7 04/29/2016 1402   CREATININE 0.80 12/29/2020 0956   CREATININE 1.0 04/29/2016 1402      Component Value Date/Time   CALCIUM 9.1 12/29/2020 0956   CALCIUM 9.3 04/29/2016 1402   ALKPHOS 95 12/29/2020 0956   ALKPHOS 71 04/29/2016 1402   AST 19 12/29/2020 0956   AST 28 04/29/2016 1402   ALT 17 12/29/2020 0956   ALT 26 04/29/2016 1402   BILITOT 0.3 12/29/2020 0956   BILITOT 0.43 04/29/2016 1402

## 2021-02-06 ENCOUNTER — Ambulatory Visit: Payer: BC Managed Care – PPO | Admitting: Internal Medicine

## 2021-02-13 ENCOUNTER — Other Ambulatory Visit: Payer: Self-pay

## 2021-02-13 ENCOUNTER — Telehealth: Payer: Self-pay

## 2021-02-13 NOTE — Telephone Encounter (Signed)
Called back. He left a message regarding needing medical records for long term disability. Told him I spoke with the person who fills out paper for disability and she will email him a release form for HIM and explain. He verbalized understanding.

## 2021-02-20 ENCOUNTER — Telehealth: Payer: Self-pay

## 2021-02-20 NOTE — Telephone Encounter (Signed)
Called and left a message for [name]'s wife. We received the paperwork on 1/16 for disability. Please allow up to 10 business days/14 calendar days for processing. They are also requesting records and that request will be forwarded to [employer]. Please allow up to 30 days for medical records requests. When the paper work is complete someone will give her or [name] a call. Ask her to call the office back for questions.

## 2021-02-26 ENCOUNTER — Telehealth: Payer: Self-pay | Admitting: *Deleted

## 2021-02-26 NOTE — Telephone Encounter (Signed)
[id number]: LVM for patient's wife, Mendel Ryder, to follow up on the questionnaires given to them at last MD visit. This research nurse has not received them yet. Asked Mendel Ryder to please call research nurse back at her convenience Foye Spurling, BSN, RN, [place] Nurse II 02/26/2021 10:57 AM .

## 2021-02-27 ENCOUNTER — Encounter: Payer: Self-pay | Admitting: Hematology and Oncology

## 2021-03-09 ENCOUNTER — Other Ambulatory Visit (HOSPITAL_COMMUNITY): Payer: Self-pay

## 2021-03-09 ENCOUNTER — Encounter: Payer: Self-pay | Admitting: Hematology and Oncology

## 2021-03-09 ENCOUNTER — Other Ambulatory Visit: Payer: Self-pay | Admitting: Hematology and Oncology

## 2021-03-09 ENCOUNTER — Telehealth: Payer: Self-pay

## 2021-03-09 MED ORDER — METHADONE HCL 10 MG PO TABS
20.0000 mg | ORAL_TABLET | Freq: Three times a day (TID) | ORAL | 0 refills | Status: DC
  Filled 2021-03-09: qty 30, 5d supply, fill #0

## 2021-03-09 MED ORDER — MORPHINE SULFATE 30 MG PO TABS
30.0000 mg | ORAL_TABLET | ORAL | 0 refills | Status: DC | PRN
  Filled 2021-03-09: qty 90, 15d supply, fill #0

## 2021-03-09 NOTE — Telephone Encounter (Signed)
Called back and scheduled appt for Monday at 2:45 pm with Dr. Alvy Bimler. Dr. Alvy Bimler sent Methadone Rx, enough to last thru the weekend and Morphine refill to pharmacy. Methadone Rx can have a lot of interactions with other medications. [name] appreciated the call back and verbalized understanding.

## 2021-03-09 NOTE — Telephone Encounter (Signed)
Returned wife's call. Shanon Brow cannot take anything by mouth now and has to put everything thru peg tube. She is asking if you can switch the MS Contin to something else? He is not supposed to put MS Contin thru the peg tube. She is asking if a patch is a option?  He also needs a refill on the Morphine Rx. Ask if they can be sent to [facility] outpatient pharmacy.

## 2021-03-12 ENCOUNTER — Encounter: Payer: Self-pay | Admitting: Hematology and Oncology

## 2021-03-12 ENCOUNTER — Inpatient Hospital Stay: Payer: BC Managed Care – PPO | Attending: Hematology and Oncology | Admitting: Hematology and Oncology

## 2021-03-12 ENCOUNTER — Other Ambulatory Visit: Payer: Self-pay

## 2021-03-12 DIAGNOSIS — C029 Malignant neoplasm of tongue, unspecified: Secondary | ICD-10-CM | POA: Diagnosis not present

## 2021-03-12 DIAGNOSIS — G893 Neoplasm related pain (acute) (chronic): Secondary | ICD-10-CM | POA: Insufficient documentation

## 2021-03-12 NOTE — Progress Notes (Signed)
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   All questions were answered. The patient knows to call the clinic with any problems, questions or concerns. The total time spent in the appointment was 25 minutes encounter with patients including review of chart and various tests results, discussions about plan of care and coordination of care plan   Heath Lark, MD 03/12/2021 3:38 PM  INTERVAL HISTORY: Please see below for problem oriented charting. 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  I have reviewed the past medical history, past  surgical history, social history and family history with the patient and they are unchanged from previous note.  ALLERGIES:  is allergic to cetuximab.  MEDICATIONS:  Current Outpatient Medications  Medication Sig Dispense Refill   Docusate Sodium 100 MG/5ML ENEM as needed.     magic mouthwash (nystatin, lidocaine, diphenhydrAMINE, alum & mag hydroxide) suspension Use 5 MLs 4 times a day to swish and spit 240 mL 0   methadone (DOLOPHINE) 10 MG tablet Take 2 tablets (20 mg total) by mouth every 8 (eight) hours. 30 tablet 0   morphine (MS CONTIN) 60 MG 12 hr tablet Take 1 tablet by mouth in the morning, at noon, and at bedtime. 90 tablet 0   morphine (MSIR) 30 MG tablet Take 1 tablet by mouth every 4 hours as needed for severe pain. 90 tablet 0   Nutritional Supplements (FEEDING SUPPLEMENT, OSMOLITE 1.5 CAL,) LIQD 7 cartons Osmolite 1.5 split over four feedings/day. Flush tube with 180 ml water QID. Drink by mouth or give via tube additional 2 1/2 cups fluid daily. This provides  2485 kcal, 104.3 g protein, 2581 ml total water. 1659 mL 0   polyethylene glycol powder (GLYCOLAX/MIRALAX) 17 GM/SCOOP powder Take by mouth.     No current facility-administered medications for this visit.    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   09/06/2011 Surgery   He underwent right neck dissection which showed 1/24 LN involved and repeat resection of tongue was negative   03/23/2012 Surgery   Repeat tngue resection showed well differentiated SCC 1.5 cm which invaded into the muscle  04/17/2012 Imaging   PET/CT scan showed New adenopathy in the left neck, compatible with recurrent malignancy   04/23/2012 Surgery   he underwent left neck LN dissection and 2/34 LN were positve   06/01/2012 - 07/13/2012 Chemotherapy   Patient had 3 cycles of cisplatin   06/12/2012 - 07/14/2012 Radiation Therapy    Patient completed RT   12/10/2012 Imaging   Interval resolution of previous hypermetabolic cervical adenopathy. There is nonspecific asymmetric increased uptake within the right side of tongue   03/13/2013 Imaging   PET/CT scan showed no evidence of disease recurrence. He was noted to have incidental kidney stones   08/15/2014 Imaging   CT scan of the dissection no evidence of disease   05/02/2020 Imaging   CT scan of neck 1. 4.3 x 2.7 x 2.7 cm peripherally enhancing mass at the anterior tongue base with focal sclerosis of the anterior genu of the mandible. 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 2. 8 mm peripherally enhancing left level 2 lymph node is concerning for metastatic disease. Recommend PET scan of the neck to further characterize disease. 3. No other significant recurrent left-sided lymph nodes. 4. Post radiation changes of the carotid sheath bilaterally. 5. Post radiation changes at the lung apices bilaterally. 6. Polyps or mucous retention cysts within the maxillary sinuses bilaterally.   05/24/2020 Pathology Results   A. SUBMENTAL MASS, MIDLINE, NEEDLE CORE BIOPSY:  - Poorly differentiated sarcomatoid malignancy.   COMMENT:   CK7 is positive.  CKAE1/AE3, CK8/18, CK20, p40, CK5/6, S-100, Melan-A and Desmin are negative.  SMA demonstrates nonspecific staining.  Ki-67 proliferation index is high.  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     05/24/2020 Procedure   Successful ultrasound submental mass 18 gauge core biopsy   06/07/2020 PET scan   IMPRESSION: 1. Unfortunately evidence of squamous cell carcinoma recurrence in the floor of the mouth. Broad lesion with intense peripheral metabolic activity in the anterior RIGHT floor the mouth. 2. Bilateral hypermetabolic nodules within the sub submandibular which are new from PET-CT 2015. Concern for unusual  metastasis to the submandibular glands. 3. New small bilateral small pulmonary nodules with faint radiotracer activity. These are also concerning for metastasis. 4. Asymmetric hypermetabolic activity within the RIGHT testicle. Favor benign inflammation; however consider testicular ultrasound if concern for unusual pattern of metastasis.   06/15/2020 Procedure   Successful placement of a right internal jugular approach power injectable Port-A-Cath. The catheter is ready for immediate use.     06/19/2020 - 08/05/2020 Chemotherapy          08/21/2020 PET scan   1. Progressive tumor in the floor the mouth with overt permeative destructive changes involving the mandible. 2. Persistent hypermetabolic neck nodes as detailed above. No new findings. 3. Progressive pulmonary metastatic disease. 4. No findings for abdominal/pelvic metastatic disease. 5. Persistent hypermetabolism in the right hemiscrotum most likely due to a varicocele.     08/29/2020 - 12/06/2020 Chemotherapy   Patient is on Treatment Plan : HEAD/NECK Cisplatin + Docetaxel q21d plus cetuximab      08/31/2020 - 12/15/2020 Chemotherapy   Patient is on Treatment Plan : HEAD/NECK Cetuximab q7d     10/27/2020 Imaging   1. Allowing for differing technique, no change in bilateral solid pulmonary nodules. 2. Several ground-glass nodules in the RIGHT upper lobe and mild tree-in-bud pattern in the RIGHT lower lobe could indicate mild pulmonary infection. 3. No lymphadenopathy.  10/27/2020 Imaging   Considerable enlargement of tumor at the anterior tongue and floor of the mouth measuring approximately 6 x 5 x 3 cm, with areas of intratumoral necrosis, particularly anterior.   Considerable progression of lytic destructive involvement of the mandible, more extensive on the right side than the left.   Enlargement of a low-density malignant level 2 node on the left, 12 mm today compared with 7 mm in April.   12/12/2020 Procedure    Technically successful placement of 20 Fr gastrostomy tube with absorbable gastropexy sutures   12/25/2020 Imaging   CT neck 1. Mild enlargement of necrotic tumor involving the floor of mouth and anterior tongue with extension to the skin. 2. Mild progression of mandibular destruction with mildly displaced right and nondisplaced left mandibular body fractures. 3. Unchanged necrotic left level II lymph node.  4. Increased, large left mastoid effusion with a new left middle ear effusion.     12/26/2020 Imaging   1. Multiple small bilateral pulmonary nodules are slightly enlarged compared to prior examination, consistent with worsened pulmonary metastatic disease. 2. 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   Metastasis to lung ([place])  06/09/2020 Initial Diagnosis   Metastasis to lung ([place])   08/31/2020 - 12/15/2020 Chemotherapy   Patient is on Treatment Plan : HEAD/NECK Cetuximab q7d       PHYSICAL EXAMINATION: ECOG PERFORMANCE STATUS: 2 - Symptomatic, <50% confined to bed GENERAL:alert, no distress and comfortable NECK: The patient has a hole in the submandibular region, measure approximately 4 cm across, worse compared to previous exam NEURO: alert & oriented  LABORATORY DATA:  I have reviewed the data as listed    Component Value Date/Time   NA 137 12/29/2020 0956   NA 141 04/29/2016 1402   K 3.9 12/29/2020 0956   K 4.1 04/29/2016 1402   CL 101 12/29/2020 0956   CL 100 07/10/2012 1341   CO2 27 12/29/2020 0956   CO2 25 04/29/2016 1402   GLUCOSE 130 (H) 12/29/2020 0956   GLUCOSE 91 04/29/2016 1402   GLUCOSE 118 (H) 07/10/2012 1341   BUN 12 12/29/2020 0956   BUN 9.7 04/29/2016 1402   CREATININE 0.80 12/29/2020 0956   CREATININE 1.0 04/29/2016 1402   CALCIUM 9.1 12/29/2020 0956   CALCIUM 9.3 04/29/2016 1402   PROT 7.0 12/29/2020 0956   PROT 7.0 04/29/2016 1402   ALBUMIN 3.5 12/29/2020 0956   ALBUMIN 4.1 04/29/2016 1402   AST 19 12/29/2020 0956   AST 28  04/29/2016 1402   ALT 17 12/29/2020 0956   ALT 26 04/29/2016 1402   ALKPHOS 95 12/29/2020 0956   ALKPHOS 71 04/29/2016 1402   BILITOT 0.3 12/29/2020 0956   BILITOT 0.43 04/29/2016 1402   GFRNONAA >60 12/29/2020 0956   GFRAA >60 05/02/2017 0901    No results found for: SPEP, UPEP  Lab Results  Component Value Date   WBC 8.8 12/29/2020   NEUTROABS 7.8 (H) 12/29/2020   HGB 10.7 (L) 12/29/2020   HCT 33.5 (L) 12/29/2020   MCV 88.6 12/29/2020   PLT 330 12/29/2020      Chemistry      Component Value Date/Time   NA 137 12/29/2020 0956   NA 141 04/29/2016 1402   K 3.9 12/29/2020 0956   K 4.1 04/29/2016 1402   CL 101 12/29/2020 0956   CL 100 07/10/2012 1341   CO2 27 12/29/2020 0956   CO2 25 04/29/2016 1402   BUN 12 12/29/2020 0956   BUN  9.7 04/29/2016 1402   CREATININE 0.80 12/29/2020 0956   CREATININE 1.0 04/29/2016 1402      Component Value Date/Time   CALCIUM 9.1 12/29/2020 0956   CALCIUM 9.3 04/29/2016 1402   ALKPHOS 95 12/29/2020 0956   ALKPHOS 71 04/29/2016 1402   AST 19 12/29/2020 0956   AST 28 04/29/2016 1402   ALT 17 12/29/2020 0956   ALT 26 04/29/2016 1402   BILITOT 0.3 12/29/2020 0956   BILITOT 0.43 04/29/2016 1402

## 2021-03-12 NOTE — Assessment & Plan Note (Signed)
Unfortunately, the patient is not able to swallow due to worsening disease in his oropharynx We discussed fentanyl patch versus methadone I recommend trial of methadone He is in agreement I warned him about risk of sedation, constipation and possible hallucination

## 2021-03-12 NOTE — Assessment & Plan Note (Signed)
Clinically, it appears that the patient has disease progression He has appointment to return back to [place] for further evaluation and follow-up I will continue to provide supportive care

## 2021-03-13 ENCOUNTER — Encounter: Payer: Self-pay | Admitting: Hematology and Oncology

## 2021-03-21 ENCOUNTER — Telehealth: Payer: Self-pay | Admitting: *Deleted

## 2021-03-21 NOTE — Telephone Encounter (Signed)
[id number]: LVM for patient's wife, Mendel Ryder, to follow up on the questionnaires given to patient and herself on 02/02/21. This research nurse has not received them yet. Included in my message that it is okay if they want to stop completing questionnaires. The research team would like to know if patient and wife want to continue on study and if it's okay for [place] to continue to collect information from patient's medical record for the study even if they decline to complete any further questionnaires. Thanked wife for her time and requested she call nurse back to discuss.  Foye Spurling, BSN, RN, [employer] Research Nurse II 03/21/2021 2:44 PM .

## 2021-04-02 ENCOUNTER — Telehealth: Payer: Self-pay

## 2021-04-02 ENCOUNTER — Other Ambulatory Visit: Payer: Self-pay | Admitting: Hematology and Oncology

## 2021-04-02 ENCOUNTER — Other Ambulatory Visit (HOSPITAL_COMMUNITY): Payer: Self-pay

## 2021-04-02 ENCOUNTER — Encounter: Payer: Self-pay | Admitting: Hematology and Oncology

## 2021-04-02 MED ORDER — MORPHINE SULFATE 30 MG PO TABS
30.0000 mg | ORAL_TABLET | ORAL | 0 refills | Status: DC | PRN
  Filled 2021-04-02: qty 90, 15d supply, fill #0

## 2021-04-02 MED ORDER — METHADONE HCL 10 MG PO TABS
20.0000 mg | ORAL_TABLET | Freq: Three times a day (TID) | ORAL | 0 refills | Status: DC
  Filled 2021-04-02: qty 90, 15d supply, fill #0

## 2021-04-02 NOTE — Telephone Encounter (Signed)
Called and told wife Rx's sent. Wife verbalized understanding. ?

## 2021-04-02 NOTE — Telephone Encounter (Signed)
Attempted to return call to [facility]. No answer. ? ?Called wife. He needs refills on both pain medications and ask that they be sent to [facility]. ?

## 2021-04-02 NOTE — Telephone Encounter (Signed)
I assume she meant MSIR and methadone. Done ?

## 2021-04-04 ENCOUNTER — Encounter: Payer: Self-pay | Admitting: *Deleted

## 2021-04-04 NOTE — Progress Notes (Signed)
3 Forms for FMLA/Disability completed and sent to Dr. Alvy Bimler for signature. [employer] and [employer]. ?

## 2021-04-05 ENCOUNTER — Telehealth: Payer: Self-pay | Admitting: *Deleted

## 2021-04-05 ENCOUNTER — Telehealth: Payer: Self-pay

## 2021-04-05 NOTE — Telephone Encounter (Signed)
Notified Patient of completion of FMLA paperwork. Fax transmission confirmation received. Copy mailed to patient as requested. ?

## 2021-04-05 NOTE — Telephone Encounter (Signed)
Disability Claim successfully faxed to [employer] at 2037.  Original copy mailed to patient address on file. ?[address] [place] [id number] ? ?Garth Bigness provided [employer] envelope preaddressed and stamped with request to mail.  This nurse prepared copy to "Record Release" bin in front office area before [place] for [facility] staff to forward [employer] request for office notes and test results to [facility] ([facility] Information Management Office, Phone: [phone], Fax: [phone] to complete this process. ?

## 2021-04-09 ENCOUNTER — Encounter: Payer: Self-pay | Admitting: Hematology and Oncology

## 2021-04-25 ENCOUNTER — Telehealth: Payer: Self-pay | Admitting: Hematology and Oncology

## 2021-04-25 ENCOUNTER — Other Ambulatory Visit: Payer: Self-pay

## 2021-04-25 MED ORDER — METHADONE HCL 10 MG PO TABS: 20.0000 mg | ORAL_TABLET | Freq: Three times a day (TID) | ORAL | 0 refills | Status: DC

## 2021-04-25 NOTE — Telephone Encounter (Signed)
Abel's wife called in for a refill on his methadone and they have also requested an appointment so I sent a message to [facility] to schedule. Gardiner Rhyme, RN  ?

## 2021-04-25 NOTE — Telephone Encounter (Signed)
Scheduled appointment per patient request. Patient aware.  ?

## 2021-04-26 ENCOUNTER — Telehealth: Payer: Self-pay

## 2021-04-26 ENCOUNTER — Other Ambulatory Visit (HOSPITAL_COMMUNITY): Payer: Self-pay

## 2021-04-26 NOTE — Telephone Encounter (Signed)
Notified Patient of prior authorization approval for Methadone. Medication is approved from 04/25/21 through 04/26/22. No other concerns or needs voiced at this time. ?

## 2021-04-27 ENCOUNTER — Other Ambulatory Visit: Payer: Self-pay | Admitting: Hematology

## 2021-04-27 ENCOUNTER — Other Ambulatory Visit: Payer: Self-pay | Admitting: Hematology and Oncology

## 2021-04-27 ENCOUNTER — Other Ambulatory Visit (HOSPITAL_COMMUNITY): Payer: Self-pay

## 2021-04-27 MED ORDER — MORPHINE SULFATE 30 MG PO TABS: 30.0000 mg | ORAL_TABLET | ORAL | 0 refills | Status: DC | PRN

## 2021-04-30 ENCOUNTER — Other Ambulatory Visit: Payer: Self-pay | Admitting: Hematology and Oncology

## 2021-04-30 ENCOUNTER — Other Ambulatory Visit (HOSPITAL_COMMUNITY): Payer: Self-pay

## 2021-04-30 ENCOUNTER — Encounter: Payer: Self-pay | Admitting: Hematology and Oncology

## 2021-04-30 ENCOUNTER — Ambulatory Visit: Payer: BC Managed Care – PPO | Admitting: Hematology and Oncology

## 2021-04-30 ENCOUNTER — Telehealth: Payer: Self-pay

## 2021-04-30 ENCOUNTER — Other Ambulatory Visit: Payer: BC Managed Care – PPO

## 2021-04-30 MED ORDER — MORPHINE SULFATE 30 MG PO TABS
60.0000 mg | ORAL_TABLET | ORAL | 0 refills | Status: DC | PRN
  Filled 2021-04-30: qty 90, 8d supply, fill #0

## 2021-04-30 NOTE — Telephone Encounter (Signed)
-----   Message from Heath Lark, MD sent at 04/30/2021  9:09 AM EDT ----- ?Can you call his wife? ?They called and said he is in more pain ?First suggestion: increase methadone to 20 mg TID PO ?Consider moving his appt to Thursday morning ?Ok to increase morphine IR to 60 mg each time ?Let me know what she thinks. It is not clear to me if Dr. Burr Medico refilled his pain medicine or not ? ?

## 2021-04-30 NOTE — Telephone Encounter (Signed)
Sent IR morphine with new instructions to take 60 mg every 4 hours/prn to [facility] ?

## 2021-04-30 NOTE — Telephone Encounter (Signed)
Received call from [facility]. They do not have Morphine Rx in stock and will cancel Rx from Dr. Burr Medico. ?

## 2021-04-30 NOTE — Telephone Encounter (Signed)
Called and given below message to wife and [name] listening on phone. [name] verbalized understanding. [name] has already increased the Methadone Rx to 20 mg TID and does not need Rx. ?Moved appt to Thursday at 1:20 pm, unable to come in the am due to another appt. ?[name] sent Morphine Rx Friday for 5 days. ?[name] will increase Morphine IR Rx to 60 mg starting today. He will need a refill with the increase. They ask that all refills be sent to [name]. They appreciated the call and will call the office back for questions. ?

## 2021-05-03 ENCOUNTER — Inpatient Hospital Stay: Payer: BC Managed Care – PPO | Attending: Hematology and Oncology | Admitting: Hematology and Oncology

## 2021-05-03 ENCOUNTER — Other Ambulatory Visit (HOSPITAL_COMMUNITY): Payer: Self-pay

## 2021-05-03 ENCOUNTER — Encounter: Payer: Self-pay | Admitting: Hematology and Oncology

## 2021-05-03 ENCOUNTER — Other Ambulatory Visit: Payer: Self-pay

## 2021-05-03 ENCOUNTER — Telehealth: Payer: Self-pay

## 2021-05-03 ENCOUNTER — Encounter: Payer: Self-pay | Admitting: *Deleted

## 2021-05-03 VITALS — BP 100/70 | HR 111 | Temp 99.6°F | Resp 18 | Ht 70.0 in | Wt 177.2 lb

## 2021-05-03 DIAGNOSIS — C029 Malignant neoplasm of tongue, unspecified: Secondary | ICD-10-CM | POA: Diagnosis present

## 2021-05-03 DIAGNOSIS — Z7189 Other specified counseling: Secondary | ICD-10-CM | POA: Diagnosis not present

## 2021-05-03 DIAGNOSIS — G893 Neoplasm related pain (acute) (chronic): Secondary | ICD-10-CM | POA: Insufficient documentation

## 2021-05-03 DIAGNOSIS — L089 Local infection of the skin and subcutaneous tissue, unspecified: Secondary | ICD-10-CM | POA: Diagnosis not present

## 2021-05-03 MED ORDER — METHADONE HCL 10 MG PO TABS: 30.0000 mg | ORAL_TABLET | Freq: Three times a day (TID) | ORAL | 0 refills | Status: DC

## 2021-05-03 MED ORDER — AMOXICILLIN-POT CLAVULANATE 875-125 MG PO TABS
1.0000 | ORAL_TABLET | Freq: Two times a day (BID) | ORAL | 0 refills | Status: AC
  Filled 2021-05-03: qty 14, 7d supply, fill #0

## 2021-05-03 NOTE — Telephone Encounter (Signed)
-----   Message from Heath Lark, MD sent at 05/03/2021  2:08 PM EDT ----- ?I signed my notes ? ?

## 2021-05-03 NOTE — Assessment & Plan Note (Signed)
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

## 2021-05-03 NOTE — Research (Signed)
STUDY NAME: [id number]: A RANDOMIZED TRIAL ADDRESSING CANCER-RELATED FINANCIAL HARDSHIP THROUGH DELIVERY OF A PROACTIVE FINANCIAL NAVIGATION INTERVENTION (CREDIT)  ? ?Met with patient and wife briefly in clinic. Patient was not feeling well. Wife reported she misplaced the questionnaires from January. Asked patient and wife their preference regarding participation in this study.  Wife and patient agreed they would prefer not to complete questionnaires but the study may continue to collect patient's information.  ? ?Withdrawal of Consent for completing questionnaires: Patient [name] and his spouse, [name], verbalize a desire to withdraw consent from active participation in the above listed study due to extra burden of completing questionnaires at this time. Patient understands the clinical research team may continue to follow the patient, if the patient voluntarily consents to being followed. The patient does verbalize consent to continue to be followed by the study. He agrees to allow the study team to continue to collect data as specified in the ICF.  ?Thanked patient and spouse for their participation in this study and allowing research to continue to collect information. Informed them research will not contact them to complete any further questionnaires. They verbalized understanding.  ?Dr. Alvy Bimler notified of above.  ? ?Foye Spurling, BSN, RN, CCRP ?Clinical Research Nurse II ?05/03/2021 2:17 PM ? ? ? ? ? ? ? ?

## 2021-05-03 NOTE — Assessment & Plan Note (Signed)
The patient has progressed on multiple lines of treatment and is no longer a candidate to continue clinical trials ?He has accepted the terminal nature of his disease and we will proceed with palliative care referral ?

## 2021-05-03 NOTE — Assessment & Plan Note (Signed)
We had numerous goals of care discussions in the past ?The patient is in agreement for palliative care referral ?I believe he is also a hospice candidate given the trajectory of his disease ?We will make a referral today ?I have not made appointment for the patient to return but we will continue to provide supportive care if needed ?

## 2021-05-03 NOTE — Telephone Encounter (Signed)
Called referral to [facility] and spoke with Margaretmary Eddy, RN. Given Palliative care referral. Olivia Mackie will reach out to his wife and when he is ready, they transition to hospice. [facility] will be attending. ?

## 2021-05-03 NOTE — Progress Notes (Signed)
[name] ?OFFICE PROGRESS NOTE ? ?Patient Care Team: ?Redmond School, MD as PCP - General (Internal Medicine) ?[name], MD as Attending Physician (Otolaryngology) ?Gery Pray, MD as Attending Physician (Radiation Oncology) ?Heath Lark, MD as Consulting Physician (Hematology and Oncology) ?Rourk, Cristopher Estimable, MD as Consulting Physician (Gastroenterology) ? 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 ?Cancer associated pain ?He has poorly controlled pain with intermittent neuropathic pain ?We discussed the risk and benefits of adding gabapentin ?For now, the patient is in agreement to increase methadone to 30 mg every 8 hours and immediate release morphine 60 mg every 4 hours as needed ?We will continue to adjust his pain medicine as needed ?We also discussed switching him to fentanyl patch but discussed limitation and difficulties of regulating the dose ?Ultimately, he is in agreement with the dose change as discussed above ? ?Skin infection ?He had completed a course of Augmentin approximately a month ago in [place] ?He had persistent necrotic tumor in his floor of mouth as well as new skin infection on the right upper extremity ?I recommend another course of Augmentin ? ?Goals of care, counseling/discussion ?We had numerous goals of care discussions in the past ?The patient is in agreement for palliative care referral ?I believe he is also a hospice candidate given the trajectory of his disease ?We will make a referral today ?I have not made appointment for the patient to return but we will continue to provide supportive care if needed ? ?No orders of the defined types were placed in this encounter. ? ? ?All questions were answered. The patient knows to call the clinic with any  problems, questions or concerns. ?The total time spent in the appointment was 40 minutes encounter with patients including review of chart and various tests results, discussions about plan of care and coordination of care plan ?  ?Heath Lark, MD ?05/03/2021 2:05 PM ? ?INTERVAL HISTORY: ?Please see below for problem oriented charting. ?he returns for treatment follow-up with his wife ?I have reviewed documentation from care everywhere ?The patient has progressed on recent clinical trial and is deemed not a candidate to continue ?He has uncontrolled pain last week ?The prescription methadone and immediate release morphine were increased ?He felt that his pain is controlled except for intermittent neuropathic pain especially when the right side of his face is exposed to cold air ?No recent fever or chills ?He is dependent on feeding tube for medication, hydration and food ?He has lost a lot of weight over the last few months ? ?REVIEW OF SYSTEMS:   ?Constitutional: Denies fevers, chills  ?Eyes: Denies blurriness of vision ?Ears, nose, mouth, throat, and face: Denies mucositis or sore throat ?Respiratory: Denies cough, dyspnea or wheezes ?Cardiovascular: Denies palpitation, chest discomfort or lower extremity swelling ?Gastrointestinal:  Denies nausea, heartburn or change in bowel habits ?Lymphatics: Denies new lymphadenopathy or easy bruising ?Neurological:Denies numbness, tingling or new weaknesses ?Behavioral/Psych: Mood is stable, no new changes  ?All other systems were reviewed with the patient and are negative. ? ?I have reviewed the past medical history, past surgical history, social history and family history with the patient and they are unchanged from previous note. ? ?ALLERGIES:  is allergic to cetuximab. ? ?MEDICATIONS:  ?Current Outpatient Medications  ?Medication Sig Dispense Refill  ?  amoxicillin-clavulanate (AUGMENTIN) 875-125 MG tablet Take 1 tablet by mouth 2 (two) times daily. 14 tablet 0  ? Docusate  Sodium 100 MG/5ML ENEM as needed.    ? magic mouthwash (nystatin, lidocaine, diphenhydrAMINE, alum & mag hydroxide) suspension Use 5 MLs 4 times a day to swish and spit 240 mL 0  ? methadone (DOLOPHINE) 10 MG tablet Take 3 tablets (30 mg total) by mouth every 8 (eight) hours. 90 tablet 0  ? morphine (MSIR) 30 MG tablet Take 2 tablets (60 mg total) by mouth every 4 (four) hours as needed for severe pain. 90 tablet 0  ? Nutritional Supplements (FEEDING SUPPLEMENT, OSMOLITE 1.5 CAL,) LIQD 7 cartons Osmolite 1.5 split over four feedings/day. Flush tube with 180 ml water QID. Drink by mouth or give via tube additional 2 1/2 cups fluid daily. This provides  2485 kcal, 104.3 g protein, 2581 ml total water. 1659 mL 0  ? polyethylene glycol powder (GLYCOLAX/MIRALAX) 17 GM/SCOOP powder Take by mouth.    ? ?No current facility-administered medications for this visit.  ? ? ?SUMMARY OF ONCOLOGIC HISTORY: ?Oncology History  ?Tongue cancer ([place])  ?08/22/2011 Surgery  ? He had partial glossectomy which showed invasive SCC measured 1.3 cm ?  ?09/06/2011 Surgery  ? He underwent right neck dissection which showed 1/24 LN involved and repeat resection of tongue was negative ?  ?03/23/2012 Surgery  ? Repeat tngue resection showed well differentiated SCC 1.5 cm which invaded into the muscle ?  ?04/17/2012 Imaging  ? PET/CT scan showed New adenopathy in the left neck, compatible with recurrent malignancy ?  ?04/23/2012 Surgery  ? he underwent left neck LN dissection and 2/34 LN were positve ?  ?06/01/2012 - 07/13/2012 Chemotherapy  ? Patient had 3 cycles of cisplatin ?  ?06/12/2012 - 07/14/2012 Radiation Therapy  ? Patient completed RT ?  ?12/10/2012 Imaging  ? Interval resolution of previous hypermetabolic cervical adenopathy. There is nonspecific asymmetric increased uptake within the right side of tongue ?  ?03/13/2013 Imaging  ? PET/CT scan showed no evidence of disease recurrence. He was noted to have incidental kidney stones ?  ?08/15/2014  Imaging  ? CT scan of the dissection no evidence of disease ?  ?05/02/2020 Imaging  ? CT scan of neck ?1. 4.3 x 2.7 x 2.7 cm peripherally enhancing mass at the anterior tongue base with focal sclerosis of the anterior genu of the mandible. 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 ?2. 8 mm peripherally enhancing left level 2 lymph node is concerning for metastatic disease. Recommend PET scan of the neck to further characterize disease. ?3. No other significant recurrent left-sided lymph nodes. ?4. Post radiation changes of the carotid sheath bilaterally. ?5. Post radiation changes at the lung apices bilaterally. ?6. Polyps or mucous retention cysts within the maxillary sinuses bilaterally. ?  ?05/24/2020 Pathology Results  ? A. SUBMENTAL MASS, MIDLINE, NEEDLE CORE BIOPSY:  ?- Poorly differentiated sarcomatoid malignancy.  ? ?COMMENT:  ? ?CK7 is positive.  CKAE1/AE3, CK8/18, CK20, p40, CK5/6, S-100, Melan-A and Desmin are negative.  SMA demonstrates nonspecific staining.  Ki-67 proliferation index is high.  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   ?  ?05/24/2020 Procedure  ? Successful ultrasound submental mass 18 gauge core biopsy ?  ?06/07/2020 PET scan  ? IMPRESSION: ?1. Unfortunately evidence of squamous cell carcinoma recurrence in the floor of the mouth. Broad lesion with intense peripheral metabolic activity in the  anterior RIGHT floor the mouth. ?2. Bilateral hypermetabolic nodules within the sub submandibular which are new from PET-CT 2015. Concern for unusual metastasis to the submandibular glands. ?3. New small bilateral small pulmonary nodules with faint radiotracer activity. These are also concerning for metastasis. ?4. Asymmetric hypermetabolic activity within the RIGHT testicle. Favor benign inflammation; however consider testicular ultrasound if concern for unusual pattern of metastasis. ?   ?06/15/2020 Procedure  ? Successful placement of a right internal jugular approach power injectable Port-A-Cath. The catheter is ready for immediate use. ?  ?  ?06/19/2020 - 08/05/2020 Chemotherapy  ?  ? ?  ? ?  ?7/2

## 2021-05-03 NOTE — Assessment & Plan Note (Signed)
He had completed a course of Augmentin approximately a month ago in [place] ?He had persistent necrotic tumor in his floor of mouth as well as new skin infection on the right upper extremity ?I recommend another course of Augmentin ?

## 2021-05-04 ENCOUNTER — Encounter: Payer: Self-pay | Admitting: Nurse Practitioner

## 2021-05-04 ENCOUNTER — Telehealth: Payer: Self-pay

## 2021-05-04 ENCOUNTER — Telehealth: Payer: Self-pay | Admitting: Nurse Practitioner

## 2021-05-04 DIAGNOSIS — E43 Unspecified severe protein-calorie malnutrition: Secondary | ICD-10-CM

## 2021-05-04 DIAGNOSIS — G893 Neoplasm related pain (acute) (chronic): Secondary | ICD-10-CM

## 2021-05-04 DIAGNOSIS — Z515 Encounter for palliative care: Secondary | ICD-10-CM

## 2021-05-04 NOTE — Telephone Encounter (Signed)
Attempted to contact patient's spouse Mendel Ryder to schedule a Palliative Care consult appointment. No answer left a message to return call.  ?

## 2021-05-04 NOTE — Telephone Encounter (Signed)
Spoke with patient's spouse Mendel Ryder and scheduled a Mychart Palliative Consult for 05/04/21 @ 1 PM.  ? ?Consent obtained; updated Netsmart, Team List and Epic.  ? ?

## 2021-05-04 NOTE — Progress Notes (Signed)
? ? ?Manufacturing engineer ?Community Palliative Care Consult Note ?Telephone: [phone]  ?Fax: [phone]  ? ?Date of encounter: 05/04/21 ?4:42 PM ?PATIENT NAME: [name] ?[place] [address]   ?[phone] (home)  ?DOB: Jul 31, 1980 ?MRN: [id number] ?PRIMARY CARE PROVIDER:    ?Randy School, MD,  ?[address] ?[place] [address] ?[id number] ? ?REFERRING PROVIDER:   ?Dr Alvy Bimler ? ?RESPONSIBLE PARTY:    ?Contact Information   ? ? Name Relation Home Work Mobile  ? [name] Spouse [phone]  [phone]  ? ?  ? ?Due to the COVID-19 crisis, this visit was done via telemedicine from my office and it was initiated and consent by this patient and or family. ? ?I connected with  [name] OR PROXY on 05/04/21 by a video enabled telemedicine application and verified that I am speaking with the correct person. ?  ?I discussed the limitations of evaluation and management by telemedicine. The patient expressed understanding and agreed to proceed. Palliative Care was asked to follow this patient by consultation request of  Randy School, MD to address advance care planning and complex medical decision making. This is the initial visit.             ?ASSESSMENT AND PLAN / RECOMMENDATIONS:  ?Advance Care Planning/Goals of Care: Goals include to maximize quality of life and symptom management. Patient/health care surrogate gave his/her permission to discuss.Our advance care planning conversation included a discussion about:    ?The value and importance of advance care planning  ?Experiences with loved ones who have been seriously ill or have died  ?Exploration of personal, cultural or spiritual beliefs that might influence medical decisions  ?Exploration of goals of care in the event of a sudden injury or illness  ?Identification  of a healthcare agent  ?Review and updating or creation of an  advance directive document . ?Decision not to resuscitate or to de-escalate  disease focused treatments due to poor prognosis. ?CODE STATUS: DNR ? ?Symptom Management/Plan: ?1. Advance Care Planning;  Discussed and wishes are to be a DNR; discussed at length medical goals including comfort care, We talked about Hospice benefit under Medicare program, services provided and how the program works. Mr and Mrs [name] endorses wishes are to proceed with hospice ? ?2. Pain secondary to neoplasm; discussed pain regimen, pain on pain scale; Pain currently being managed with current regimen to include ? ?Methadone 40m q8hrs ?Morphine (MSIR) 63mq4hrs ? ?Last CT scan: CT scan of the neck which indicated "Findings suggest overall progression of tumor with increased component of nodular enhancement along the right face and submental region and lower lip. Extensive areas of destructive bony changes of the mandible are once again noted and have slightly progressed. Component of necrotic tumor in the submental region has sloughed off with a soft tissue defect. 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 ? ?3. Protein calorie malnutrition, continues to receive tube feedings, aspiration precautions, with ongoing weight loss ?06/16/2019 weight 250 lbs ?01/31/2021 weight 191.9 lbs ?05/03/2021 weight 177.3 lbs ?BMI 25.4 ?72.7 lbs in <2 years; 29.08% ?14.6 lbs/3 months; 7.61% ? ?Large open chin wound with persistent necrotic tumor in his floor of mouth as well as new skin infection on the right upper extremity ? ?4. Goals of Care: Goals include to maximize quality of life and symptom management. Our advance care planning conversation included a discussion about:    ?The value and importance of advance care planning  ?Exploration of personal, cultural or  spiritual beliefs that might influence medical decisions  ?Exploration of goals of care in the event of a sudden injury or illness  ?Identification and preparation of a healthcare agent  ?Review and updating or creation of an advance directive  document. ? ?5. Palliative care encounter; Palliative care encounter; Palliative medicine team will continue to support patient, patient's family, and medical team. Visit consisted of counseling and education dealing with the complex and emotionally intense issues of symptom management and palliative care in the setting of serious and potentially life-threatening illness ? ?I spent 38 minutes providing this consultation. More than 50% of the time in this consultation was spent in counseling and care coordination. ?PPS: 40% ? ?Chief Complaint: Initial Palliative consult for complex medical decision making ? ?HISTORY OF PRESENT ILLNESS:  [name] is a 41 y.o. 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 I connected with Mr and Mrs [name] by video for telemedicine initial PC visit. We talked about PMH, recent events including going to [place] for trial which he returned home without completing and no further treatments. We talked about symptoms, ros, functional abilities, tube feedings, weight loss. We talked about medical goals, code status and wishes for DNR, hospice benefit under Medicare program, role PC in poc. Wishes are to pursue hospice services with wishes are for comfort at home. We talked about family dynamics with 3 children, one in college then 2 young children in the home. We talked about support, needs, equipments. We talked about coping strategies. Therapeutic listening, emotional support provided. Questions answered. Will proceed with Hospice request.  ? ?SUMMARY OF ONCOLOGIC HISTORY: ?   ?Oncology History  ?Tongue cancer ([place])  ?08/22/2011 Surgery  ?  He had partial glossectomy which showed invasive SCC measured 1.3 cm ?   ?09/06/2011 Surgery  ?  He underwent right neck dissection which showed 1/24 LN involved and repeat resection of tongue  was negative ?   ?03/23/2012 Surgery  ?  Repeat tngue resection showed well differentiated SCC 1.5 cm which invaded into the muscle ?   ?04/17/2012 Imaging  ?  PET/CT scan showed New adenopathy in the left neck, compatible with recurrent malignancy ?   ?04/23/2012 Surgery  ?  he underwent left neck LN dissection and 2/34 LN were positve ?   ?06/01/2012 - 07/13/2012 Chemotherapy  ?  Patient had 3 cycles of cisplatin ?   ?06/12/2012 - 07/14/2012 Radiation Therapy  ?  Patient completed RT ?   ?12/10/2012 Imaging  ?  Interval resolution of previous hypermetabolic cervical adenopathy. There is nonspecific asymmetric increased uptake within the right side of tongue ?   ?03/13/2013 Imaging  ?  PET/CT scan showed no evidence of disease recurrence. He was noted to have incidental kidney stones ?   ?08/15/2014 Imaging  ?  CT scan of the dissection no evidence of disease ?   ?05/02/2020 Imaging  ?  CT scan of neck ?1. 4.3 x 2.7 x 2.7 cm peripherally enhancing mass at the anterior tongue base with focal sclerosis of the anterior genu of the mandible. 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 ?2. 8 mm peripherally enhancing left level 2 lymph node is concerning for metastatic disease. Recommend PET scan of the neck to further characterize disease. ?3. No other significant recurrent left-sided lymph nodes. ?4. Post  radiation changes of the carotid sheath bilaterally. ?5. Post radiation changes at the lung apices bilaterally. ?6. Polyps or mucous retention cysts within the maxillary sinuses bilaterally. ?   ?05/24/2020 Pathology Results  ?  A. SUBMENTAL MASS, MIDLINE, NEEDLE CORE BIOPSY:  ?- Poorly differentiated sarcomatoid malignancy.  ? ?COMMENT:  ? ?CK7 is positive.  CKAE1/AE3, CK8/18, CK20, p40, CK5/6, S-100, Melan-A and Desmin are negative.  SMA demonstrates nonspecific staining.  Ki-67 proliferation index is high.  The limited CK7 positive staining is most suggestive a poorly  differentiated sarcomatoid carcinoma.   ?   ?05/24/2020 Procedure  ?  Successful ultrasound submental mass 18 gauge core biopsy ?   ?06/07/2020 PET scan  ?  IMPRESSION: ?1. Unfortunately evidence of squamous cell car

## 2021-05-08 ENCOUNTER — Ambulatory Visit: Payer: BC Managed Care – PPO | Admitting: Hematology and Oncology

## 2021-05-14 ENCOUNTER — Telehealth: Payer: Self-pay

## 2021-05-14 ENCOUNTER — Other Ambulatory Visit: Payer: Self-pay | Admitting: Hematology and Oncology

## 2021-05-14 ENCOUNTER — Other Ambulatory Visit (HOSPITAL_COMMUNITY): Payer: Self-pay

## 2021-05-14 MED ORDER — METHADONE HCL 10 MG PO TABS: 30.0000 mg | ORAL_TABLET | Freq: Three times a day (TID) | ORAL | 0 refills | Status: DC

## 2021-05-14 MED ORDER — METHADONE HCL 10 MG PO TABS
30.0000 mg | ORAL_TABLET | Freq: Three times a day (TID) | ORAL | 0 refills | Status: DC
  Filled 2021-05-14: qty 120, 13d supply, fill #0

## 2021-05-14 MED ORDER — MORPHINE SULFATE 30 MG PO TABS: 90.0000 mg | ORAL_TABLET | Freq: Four times a day (QID) | ORAL | 0 refills | Status: DC | PRN

## 2021-05-14 MED ORDER — MORPHINE SULFATE 30 MG PO TABS
90.0000 mg | ORAL_TABLET | Freq: Four times a day (QID) | ORAL | 0 refills | Status: DC | PRN
  Filled 2021-05-14: qty 120, 10d supply, fill #0

## 2021-05-14 NOTE — Telephone Encounter (Signed)
I increased IR morphine to 90 mg each dose but kept methadone at 30 mg dose ?I am not sure if the [facility] would stock that much, let me know if they cannot and we will send it to [facility] ?

## 2021-05-14 NOTE — Telephone Encounter (Signed)
Received call, [facility] does not have either pain medication. Please send to  [facility], please. ? ?Canceled both Rx's at [facility]. ?

## 2021-05-14 NOTE — Telephone Encounter (Signed)
Called wife to clarify. [name] is needing a refill of IR Morphine and Methadone Rx. She ask that you send to [facility] in [place] on [address]. They are supposed to have the Rx's. ?Given message from Dr. Alvy Bimler. [name] verbalized understanding. She said that [name] has sharp shooting pains in his jaw even with taking the pain medication as ordered. ?She said a increase in the pain medication would be fine, whatever Dr. Alvy Bimler thinks is needed. She appreciated the call. ?

## 2021-05-14 NOTE — Telephone Encounter (Signed)
I removed [facility] from favorite list on his pharmacy ?Tell his wife we will do all future refills through [facility], refill sent ?

## 2021-05-14 NOTE — Telephone Encounter (Signed)
Please clarify ?1) Am I managing his pain? ?2) Do you mean IR morphine and not MS Contin? ?If not clear, call his wife for clarification what needs to be filled ?3) I do not believe adding gabapentin will add on a lot of benefit at this point. It will take a long time to load up gabapentin in his system ?

## 2021-05-14 NOTE — Telephone Encounter (Signed)
Hospice nurse called and requested refill of MS Contin and Methadone Rx sent to pharmacy. ?[name] is complaining of a lot of nerve pain in his jaw. You mentioned Gabapentin to them in the past. The nurse is asking if you can send? ?

## 2021-05-14 NOTE — Telephone Encounter (Signed)
Called and given below message to hospice nurse. She verbalized understanding and will call the office back if needed. ?

## 2021-05-28 ENCOUNTER — Telehealth: Payer: Self-pay

## 2021-05-28 ENCOUNTER — Other Ambulatory Visit: Payer: Self-pay | Admitting: Hematology and Oncology

## 2021-05-28 DIAGNOSIS — C029 Malignant neoplasm of tongue, unspecified: Secondary | ICD-10-CM

## 2021-05-28 NOTE — Telephone Encounter (Signed)
RN received voicemail from patient's hospice RN, Rickey Barbara regarding leaking g-tube. Dr. Alvy Bimler notified and order for IR evaluation placed. IR department was contacted and they are going to call patient's wife to schedule an appointment.  ?

## 2021-05-29 ENCOUNTER — Ambulatory Visit (HOSPITAL_COMMUNITY)
Admission: RE | Admit: 2021-05-29 | Discharge: 2021-05-29 | Disposition: A | Discharge status: Home / Self Care | Payer: BC Managed Care – PPO | Source: Ambulatory Visit | Attending: Hematology and Oncology | Admitting: Hematology and Oncology

## 2021-05-29 ENCOUNTER — Other Ambulatory Visit (HOSPITAL_COMMUNITY): Payer: Self-pay

## 2021-05-29 ENCOUNTER — Telehealth: Payer: Self-pay

## 2021-05-29 ENCOUNTER — Other Ambulatory Visit: Payer: Self-pay | Admitting: Hematology and Oncology

## 2021-05-29 DIAGNOSIS — C029 Malignant neoplasm of tongue, unspecified: Secondary | ICD-10-CM

## 2021-05-29 DIAGNOSIS — Z431 Encounter for attention to gastrostomy: Secondary | ICD-10-CM | POA: Insufficient documentation

## 2021-05-29 HISTORY — PX: IR REPLC GASTRO/COLONIC TUBE PERCUT W/FLUORO: IMG2333

## 2021-05-29 MED ORDER — LIDOCAINE VISCOUS HCL 2 % MT SOLN
OROMUCOSAL | Status: AC
  Filled 2021-05-29: qty 15

## 2021-05-29 MED ORDER — MORPHINE SULFATE 30 MG PO TABS
90.0000 mg | ORAL_TABLET | Freq: Four times a day (QID) | ORAL | 0 refills | Status: AC | PRN
  Filled 2021-05-29: qty 120, 10d supply, fill #0

## 2021-05-29 MED ORDER — IOHEXOL 300 MG/ML  SOLN
100.0000 mL | Freq: Once | INTRAMUSCULAR | Status: AC | PRN
  Administered 2021-05-29: 10 mL

## 2021-05-29 MED ORDER — METHADONE HCL 10 MG PO TABS
30.0000 mg | ORAL_TABLET | Freq: Three times a day (TID) | ORAL | 0 refills | Status: AC
  Filled 2021-05-29: qty 120, 14d supply, fill #0

## 2021-05-29 NOTE — Telephone Encounter (Signed)
Called and spoke with wife regarding request for pain medication. Dr. Alvy Bimler sent Rx's to [facility]. Wife will call the office from now on when Derk needs a refill. ?

## 2021-05-29 NOTE — Telephone Encounter (Signed)
This RN returned Rickey Barbara, RN's voicemail regarding patient's IR appointment and medication refill. Rickey Barbara verbalized understanding that patient can pick up his prescriptions while he is at [facility] for g-tube evaluation today.  ?

## 2021-05-29 NOTE — Procedures (Signed)
Interventional Radiology Procedure Note ? ?Procedure:  ? ?Image guided rescue of perc gastrostomy.  New 21F balloon retention.  ? ?Old tube had become displaced.  ? ?Complications: None ?Recommendations:  ?- Ok to use ?- Routine wound care  ? ?Signed, ? ?Dulcy Fanny. Earleen Newport, DO ? ? ?

## 2021-05-30 ENCOUNTER — Other Ambulatory Visit (HOSPITAL_COMMUNITY): Payer: BC Managed Care – PPO

## 2021-06-19 ENCOUNTER — Encounter: Payer: Self-pay | Admitting: Hematology and Oncology

## 2021-06-19 ENCOUNTER — Other Ambulatory Visit (HOSPITAL_COMMUNITY): Payer: Self-pay

## 2021-06-19 MED ORDER — MORPHINE SULFATE (CONCENTRATE) 10 MG /0.5 ML PO SOLN
ORAL | 0 refills | Status: AC
  Filled 2021-06-19: qty 60, 14d supply, fill #0

## 2021-06-21 ENCOUNTER — Other Ambulatory Visit (HOSPITAL_COMMUNITY): Payer: Self-pay

## 2021-06-28 DEATH — deceased

## 2021-06-29 ENCOUNTER — Encounter: Payer: Self-pay | Admitting: *Deleted

## 2021-06-29 NOTE — Progress Notes (Signed)
[id number]:  Research nurse received notice on 13-Jul-2021 that patient passed away on 2021-07-12.  Dr. Alvy Bimler printed a copy of the online death certificate she completed for patient for the research record. She states patient passed away on Hospice Care.  Dr. Alvy Bimler confirms patient did not receive any further treatment at our clinic in the past 6 months, but he did receive 1 cycle of treatment on a Phase 1 clinical trial at [facility] in January this year. Patient was then removed from trial treatment due to disease progression and did not receive any further treatment. He was hospitalized once in Feb 2023 for wound management due to the progression. Data entry completed.  Foye Spurling, BSN, RN, CCRP Clinical Research Nurse II 06/29/2021 11:26 AM

## 2021-09-19 NOTE — Progress Notes (Signed)
Error

## 2022-08-13 IMAGING — CT CT CHEST W/ CM
2 of 4 series · 15 of 36 positions shown, 18 images · IV contrast (OMNIPAQUE)
Comparison: PET-CT 08/18/2020

CLINICAL DATA: Head neck carcinoma.  Pulmonary metastasis

EXAM:
CT CHEST WITH CONTRAST
TECHNIQUE: Multidetector CT imaging of the chest was performed during
intravenous contrast administration.
CONTRAST:  60mL OMNIPAQUE IOHEXOL 350 MG/ML SOLN

[Series 2: axial st · axial · 0.76mm/px · z∈[-524,-216]mm · 12 of 180 slices shown, 15 images]
[im 13/180  mediastinal]
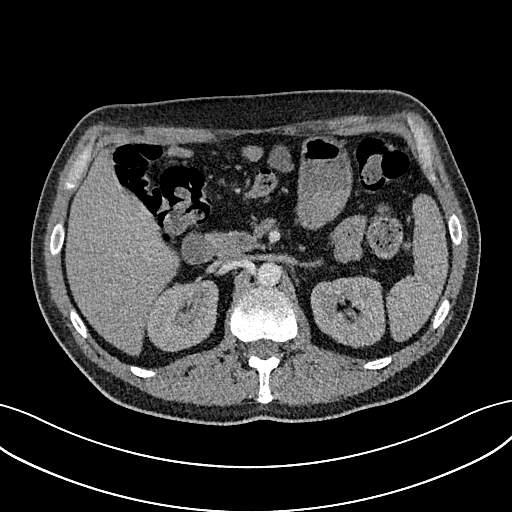
[im 13/180  lung]
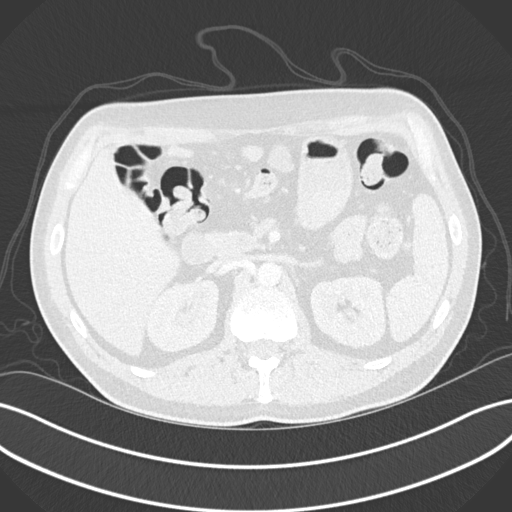
[im 26/180  lung]
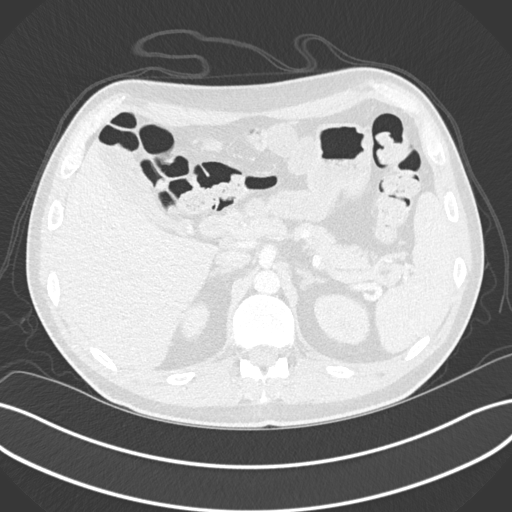
[im 39/180  lung]
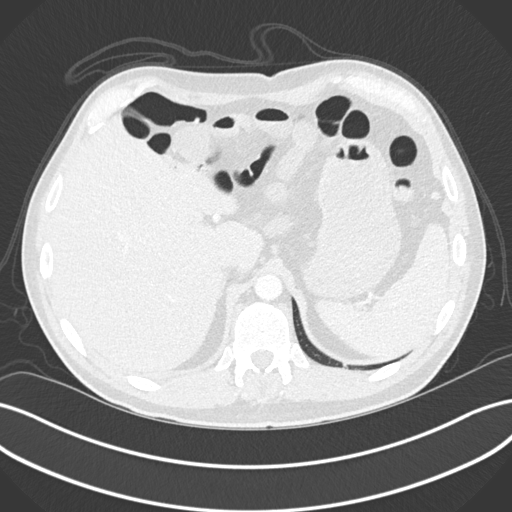
[im 52/180  lung]
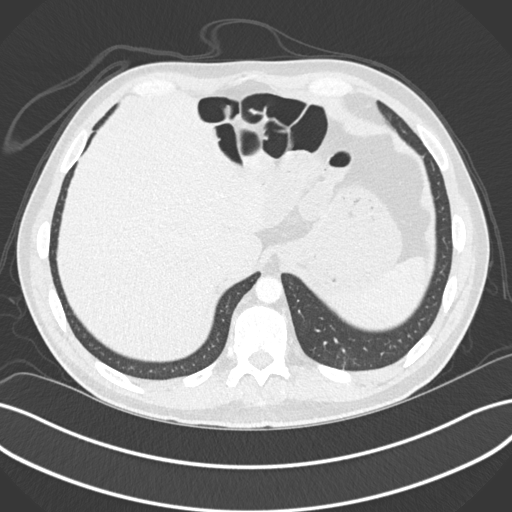
[im 64/180  mediastinal]
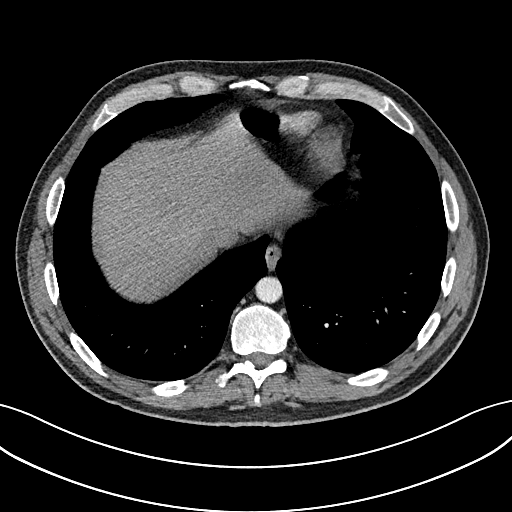
[im 64/180  lung]
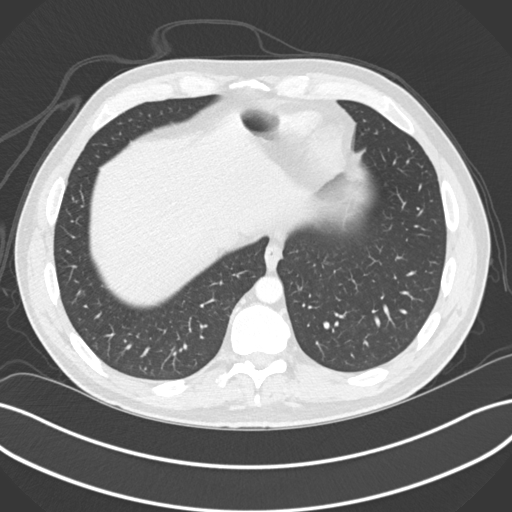
[im 77/180  lung]
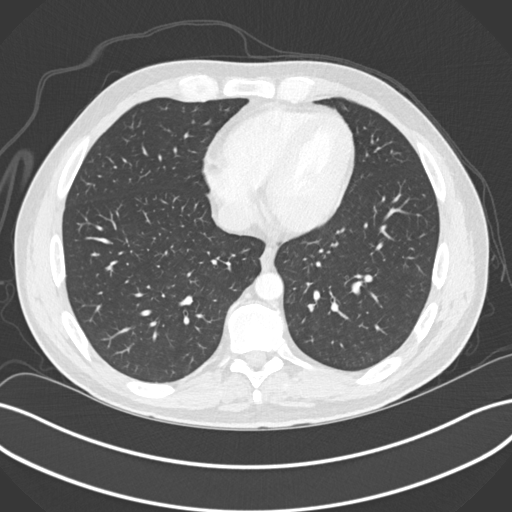
[im 103/180  lung]
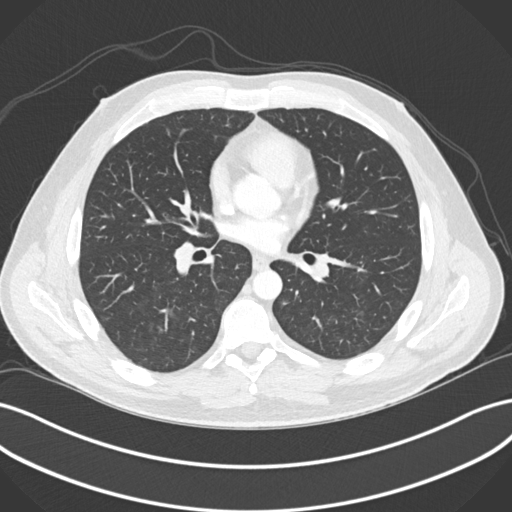
[im 116/180  lung]
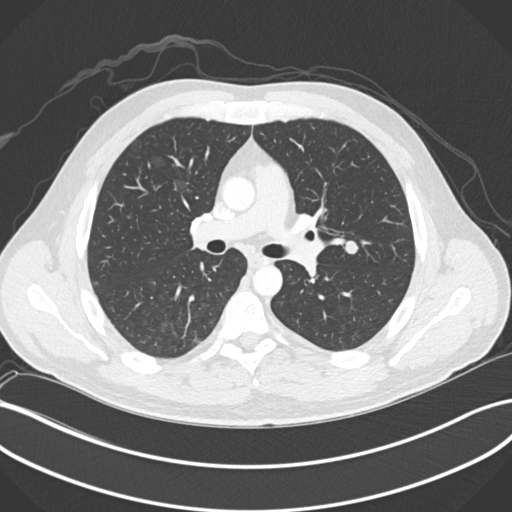
[im 128/180  mediastinal]
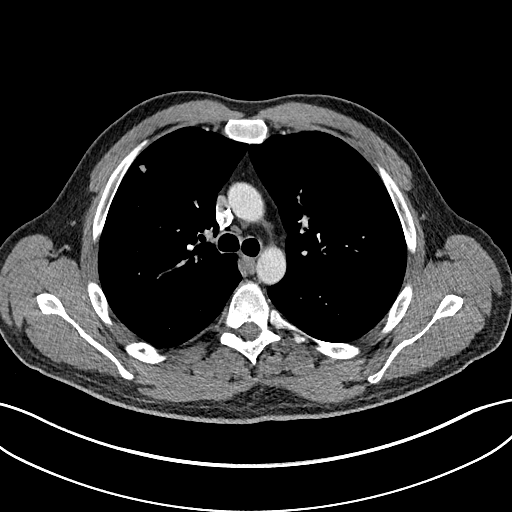
[im 128/180  lung]
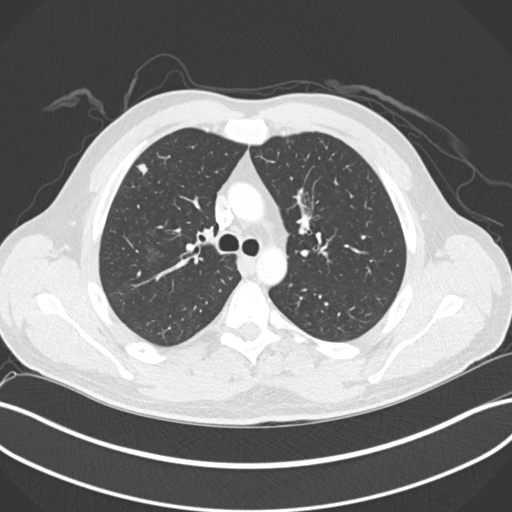
[im 141/180  lung]
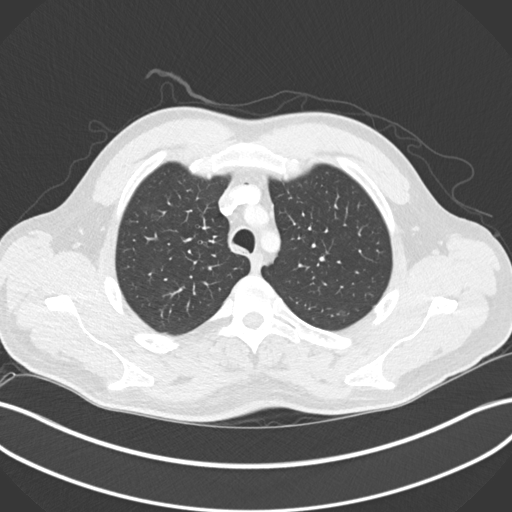
[im 154/180  lung]
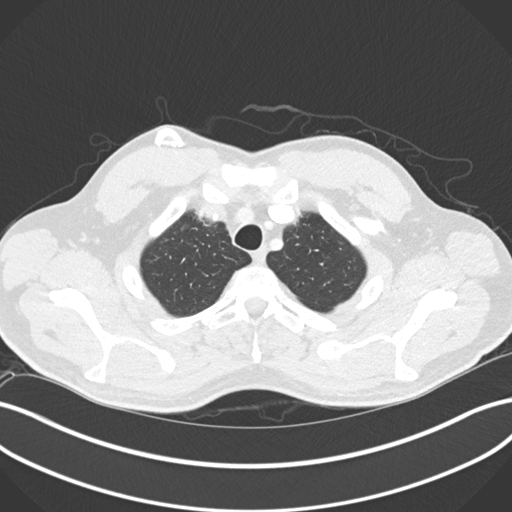
[im 167/180  lung]
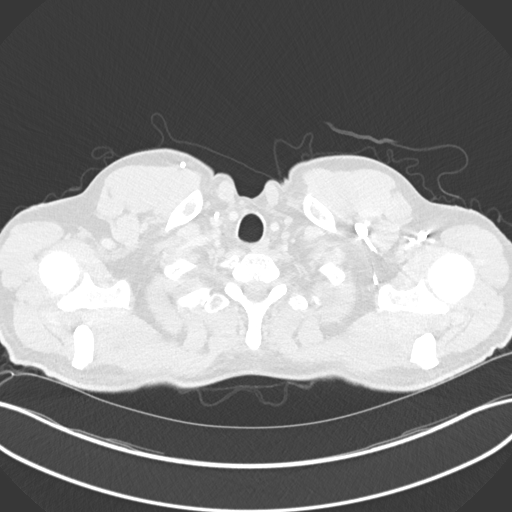

[Series 5: coronal · coronal · 0.83mm/px · 3 of 135 slices shown]
[im 27/135  lung]
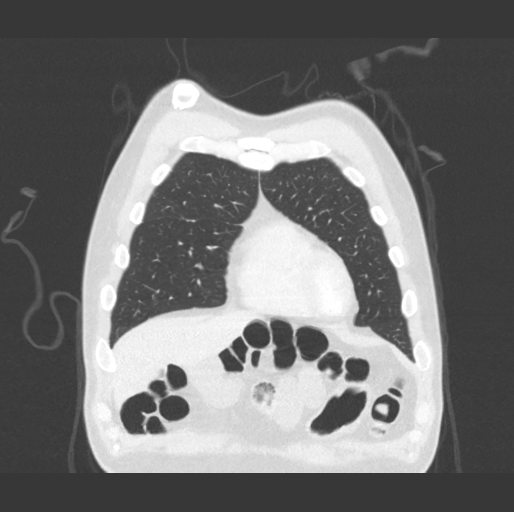
[im 54/135  lung]
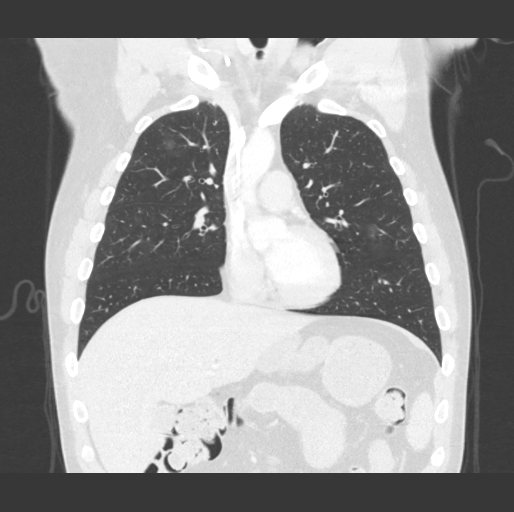
[im 81/135  lung]
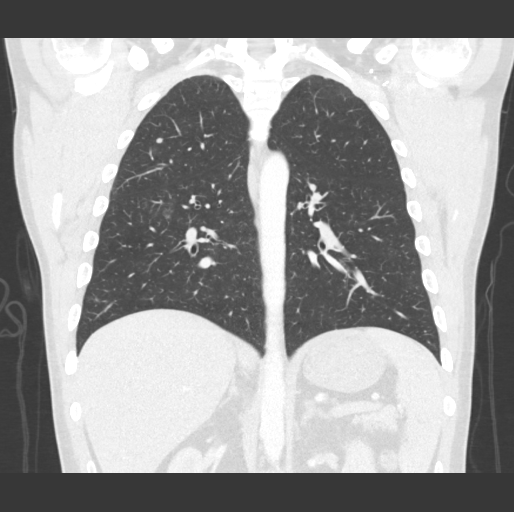

[15 of 36 positions shown; findings below may reference images not displayed]

FINDINGS: Cardiovascular: Port in the anterior chest wall with tip in distal
SVC.

Mediastinum/Nodes: No axillary or supraclavicular adenopathy. No
mediastinal or hilar adenopathy. No pericardial fluid. Esophagus
normal.

Lungs/Pleura: Bilateral pulmonary nodules again demonstrated.

Example: LEFT upper lobe rounded nodule measures 10 mm (image
58/series 7) compared with 8 mm on comparison CT from most recent
PET exam.

LEFT lower lobe nodule measures 7 mm (image 80) compared with 8 mm.

In the medial RIGHT middle lobe, 11 mm nodule (image 77) compares to
11 mm.

Within the RIGHT upper lobe 6 mm nodule (image 45) compares to 6 mm.

There several smudgy ground-glass nodules in the RIGHT upper lobe
(image 38/series 7) which are not clearly seen on prior but there is
differing technique. Subtle tree-in-bud pattern in the RIGHT lower
lobe (image 59/series 7).

Upper Abdomen: Limited view of the liver, kidneys, pancreas are
unremarkable. Normal adrenal glands.

Musculoskeletal: No aggressive osseous lesion.
IMPRESSION: 1. Allowing for differing technique, no change in bilateral solid
pulmonary nodules.
2. Several ground-glass nodules in the RIGHT upper lobe and mild
tree-in-bud pattern in the RIGHT lower lobe could indicate mild
pulmonary infection.
3. No lymphadenopathy.

## 2022-08-13 IMAGING — CT CT NECK W/ CM
4 of 5 series · 14 of 33 positions shown, 16 images · IV contrast (omnipaque)
Comparison: 05/02/2020 CT.  PET scan 08/18/2020.

CLINICAL DATA: Restaging of tongue cancer.

EXAM:
CT NECK WITH CONTRAST
TECHNIQUE: Multidetector CT imaging of the neck was performed using the
standard protocol following the bolus administration of intravenous
contrast.
CONTRAST:  60mL OMNIPAQUE IOHEXOL 350 MG/ML SOLN

[Series 4: axial bone · axial · 0.46mm/px · z∈[-240,-108]mm · 3 of 134 slices shown, 4 images]
[im 34/134  soft-tissue]
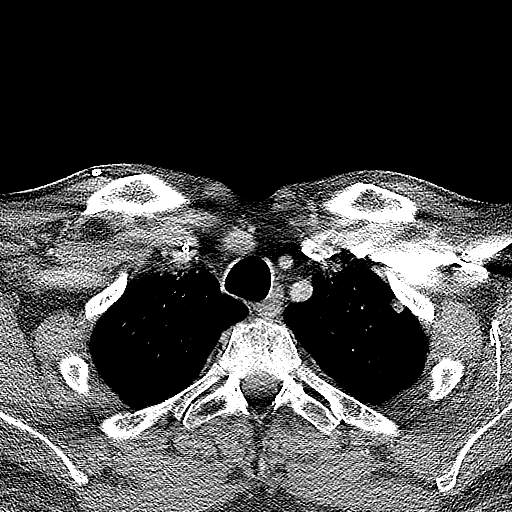
[im 34/134  bone]
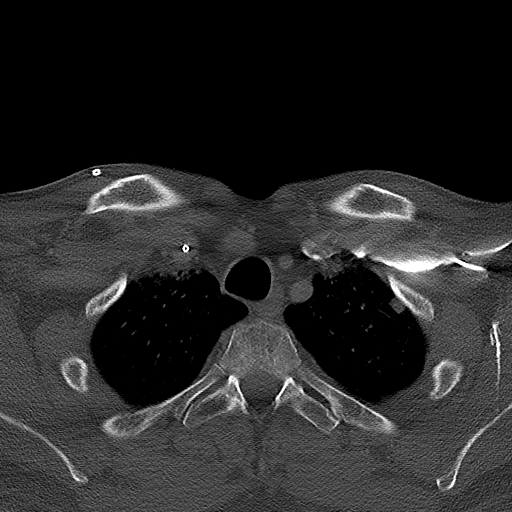
[im 67/134  bone]
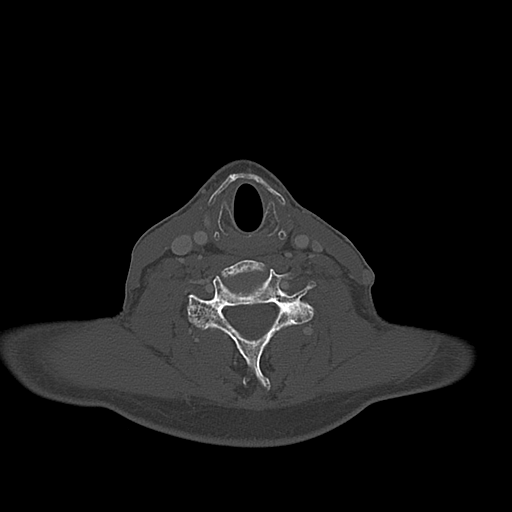
[im 100/134  bone]
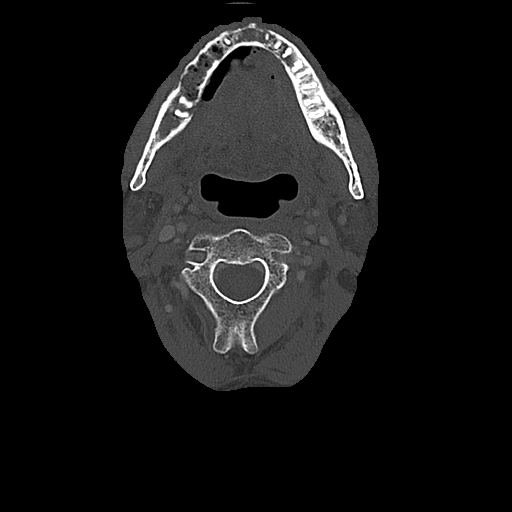

[Series 5: orthogonal (person_name) · axial · 0.39mm/px · z∈[-268,-126]mm · 3 of 146 slices shown]
[im 37/146  bone]
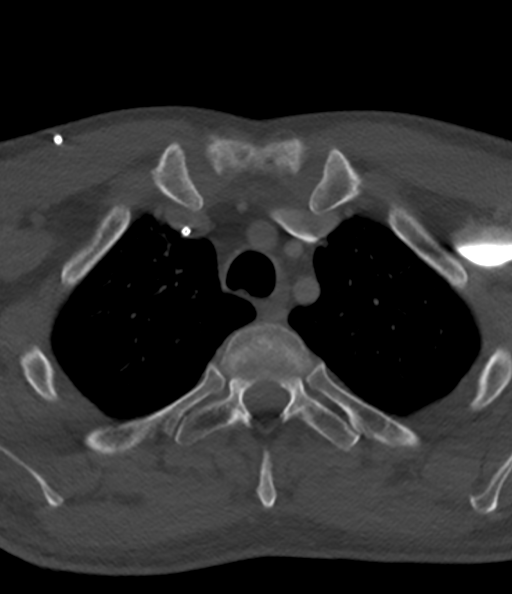
[im 73/146  bone]
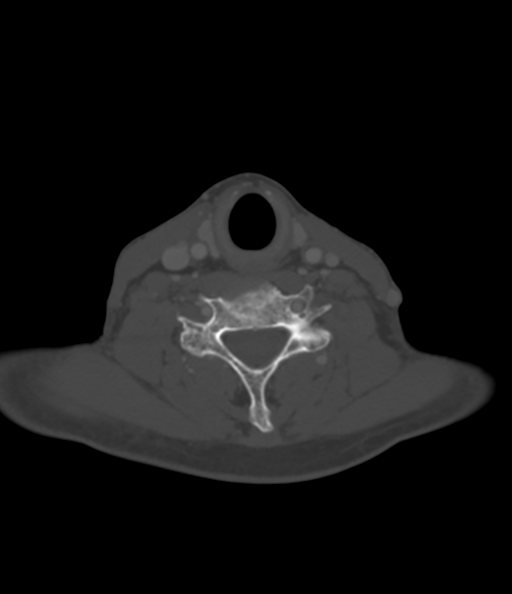
[im 109/146  bone]
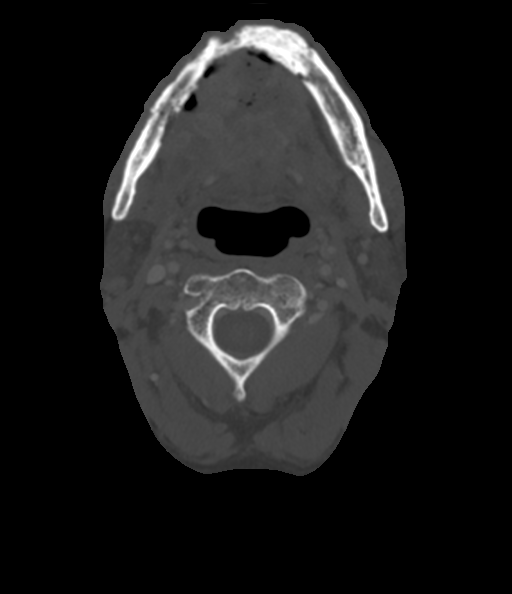

[Series 6: cor neck · coronal · 0.47mm/px · 3 of 117 slices shown]
[im 24/117  bone]
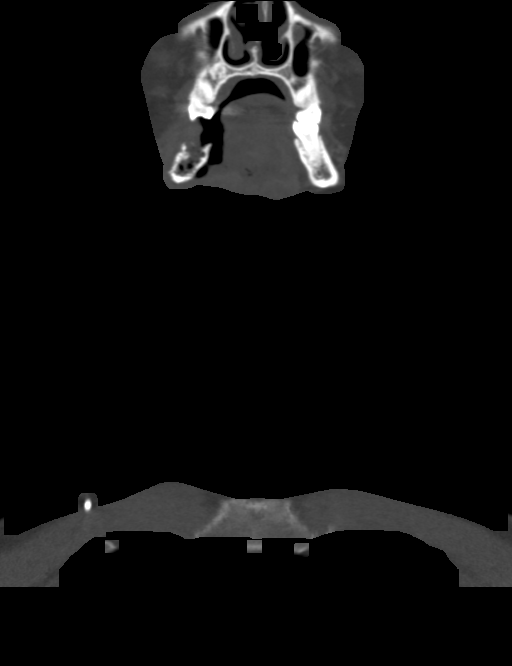
[im 47/117  bone]
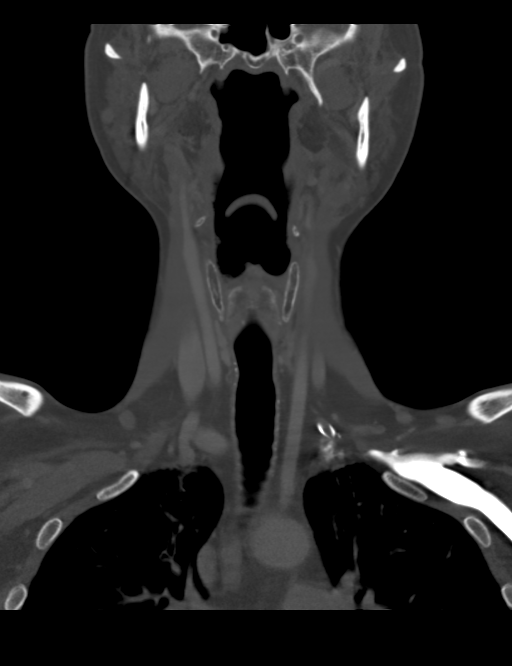
[im 70/117  bone]
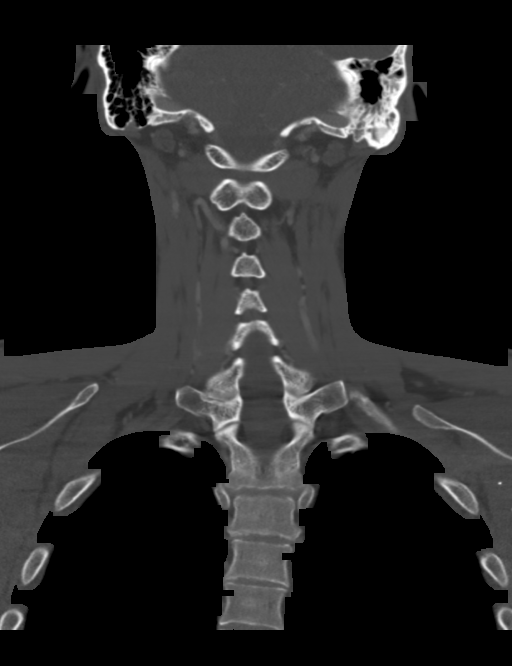

[Series 7: sag neck · sagittal · 0.57mm/px · 5 of 93 slices shown, 6 images]
[im 31/93  bone]
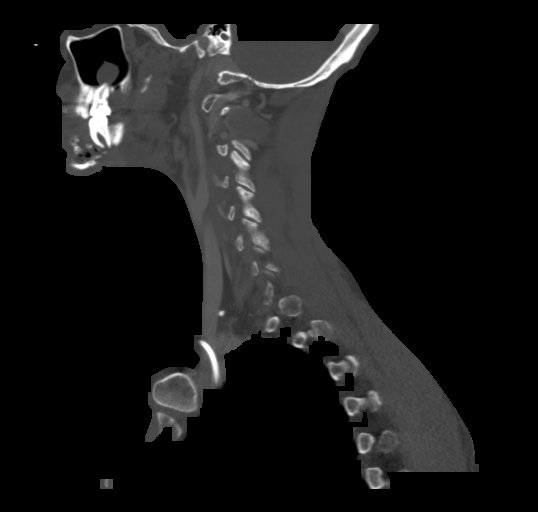
[im 39/93  bone]
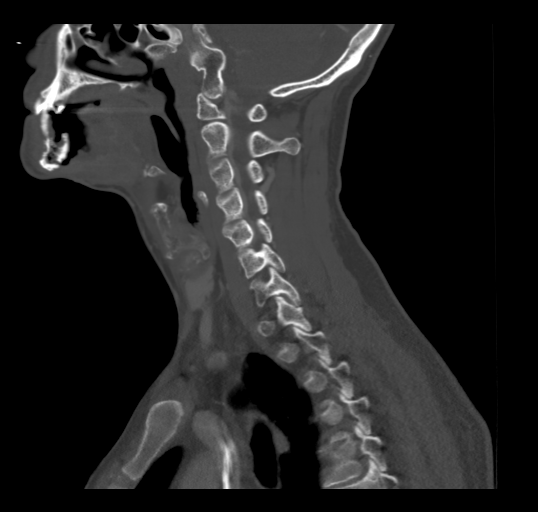
[im 47/93  soft-tissue]
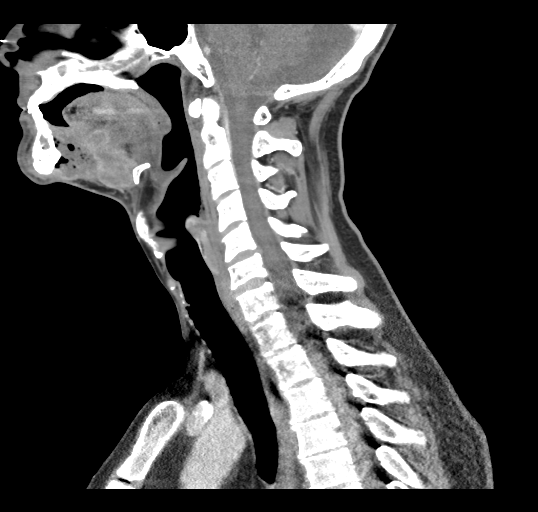
[im 47/93  bone]
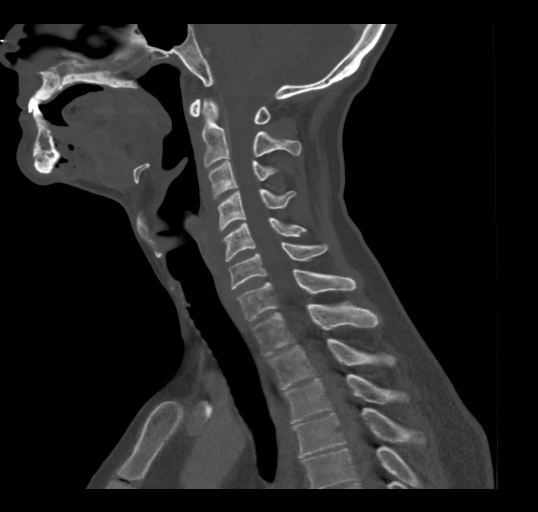
[im 54/93  bone]
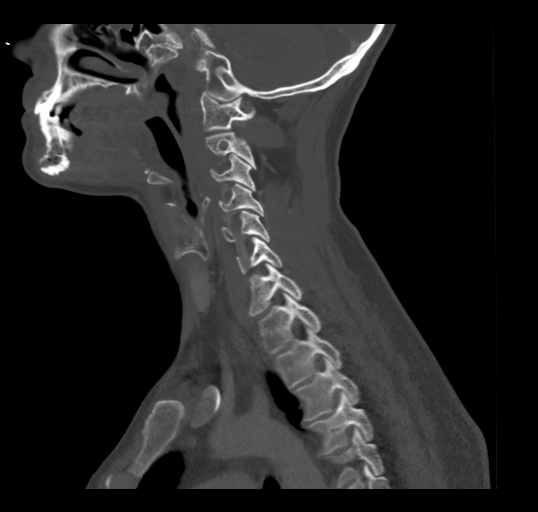
[im 62/93  bone]
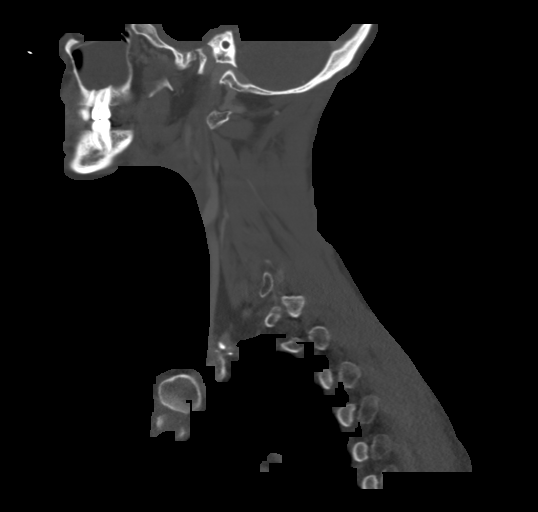

[14 of 33 positions shown; findings below may reference images not displayed]

FINDINGS: Pharynx and larynx: There is been considerable growth tumor of the
anterior tongue and floor of the mouth now measuring approximately 6
x 5 x 3 cm. There appear to be areas intratumoral necrosis
particularly anterior. There is been marked progression of lytic
destruction of the mandible, extensively within the right body of
the mandible the crossing the midline from right to left, now with
involvement of the anterior left body.

Salivary glands: Submandibular glands are atrophic or surgically
absent. No primary parotid pathology. Bilateral parotid lymph nodes
appear stable since the prior CT and PET study.

Thyroid: Normal

Lymph nodes: Slight enlargement of a necrotic level 2 lymph node on
the left axial image 40, now measuring 12 mm in diameter compared
with 7 mm in diameter in [place].

Bilateral parotid lymph nodes appear stable since the prior
examination is and I would favor that they are not involved by
malignancy. No other new lymph node.

Vascular: Intimal thickening likely related to radiation. No flow
limiting stenosis.

Limited intracranial: Normal

Visualized orbits: Limited, negative.

Mastoids and visualized paranasal sinuses: Retention cyst in the
left maxillary sinus.

Skeleton: Extensive mandibular involvement as discussed above.
Cervical spine appears negative.

Upper chest: See results of chest CT.

Other: None
IMPRESSION: Considerable enlargement of tumor at the anterior tongue and floor
of the mouth measuring approximately 6 x 5 x 3 cm, with areas of
intratumoral necrosis, particularly anterior.

Considerable progression of lytic destructive involvement of the
mandible, more extensive on the right side than the left.

Enlargement of a low-density malignant level 2 node on the left, 12
mm today compared with 7 mm in [place].

## 2022-09-25 IMAGING — CT CT ABDOMEN W/O CM
2 of 4 series · 16 of 46 positions shown, 18 images · non-contrast
Comparison: PET-CT 08/18/2020

CLINICAL DATA: Declining health

Struggling to maintain oral intake with progressive weight loss
CT obtained for evaluation for possible feeding tube placement
EXAM:
CT ABDOMEN WITHOUT CONTRAST
TECHNIQUE: Multidetector CT imaging of the abdomen was performed following the
standard protocol without IV contrast.

[Series 2: abd pel wo · axial · 0.71mm/px · z∈[-392,-137]mm · 13 of 57 slices shown, 15 images]
[im 3/57  soft-tissue]
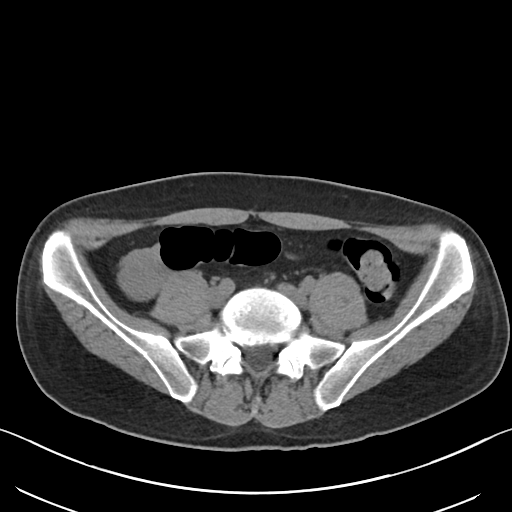
[im 3/57  bone]
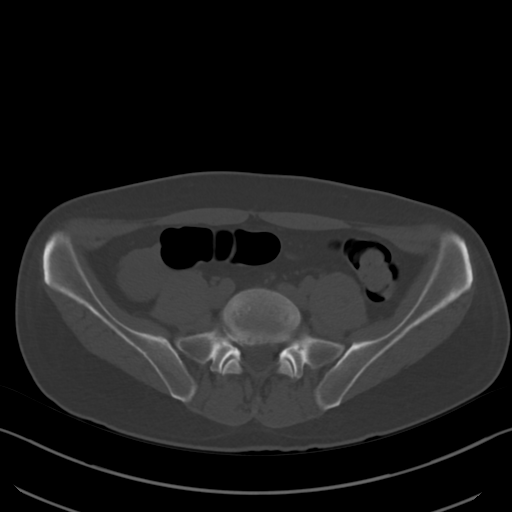
[im 8/57  soft-tissue]
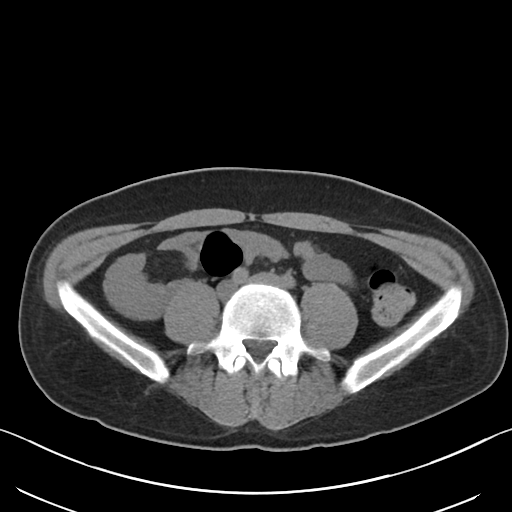
[im 12/57  soft-tissue]
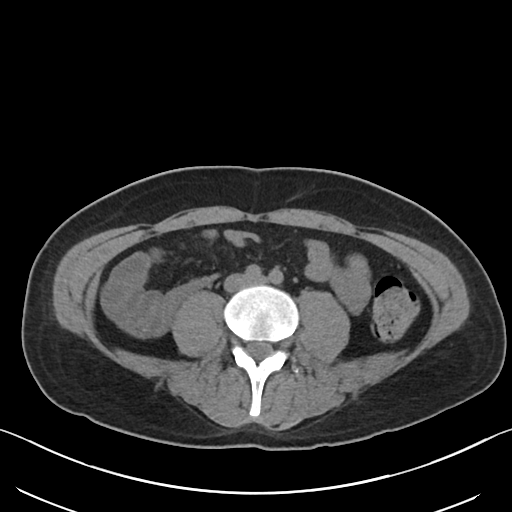
[im 17/57  soft-tissue]
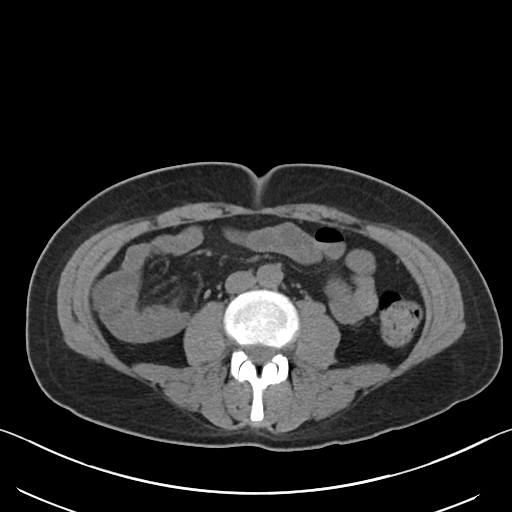
[im 19/57  soft-tissue]
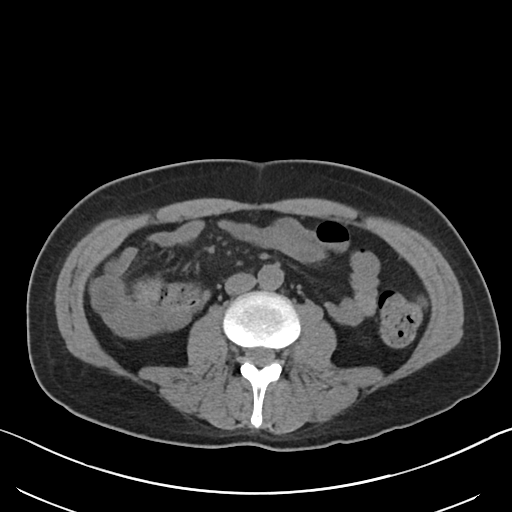
[im 24/57  soft-tissue]
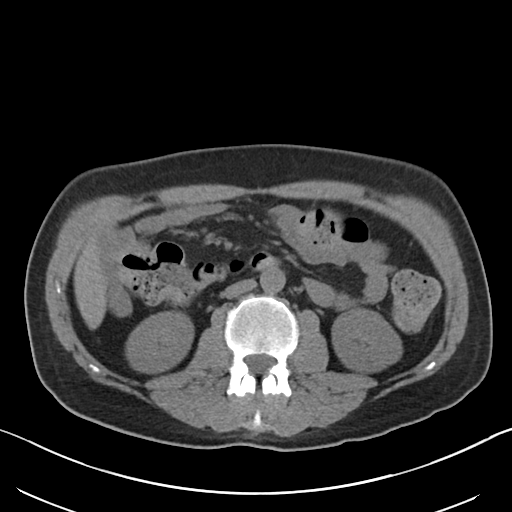
[im 29/57  soft-tissue]
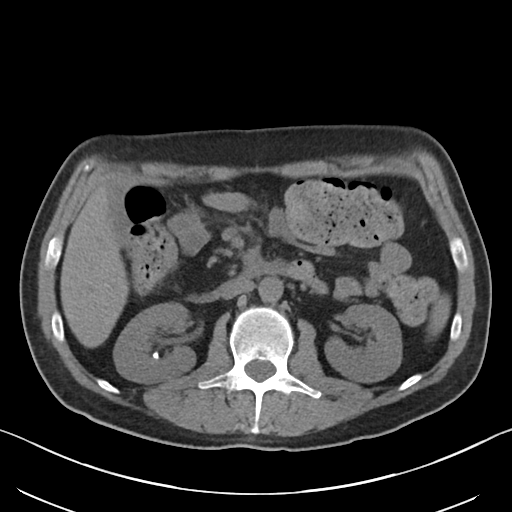
[im 33/57  soft-tissue]
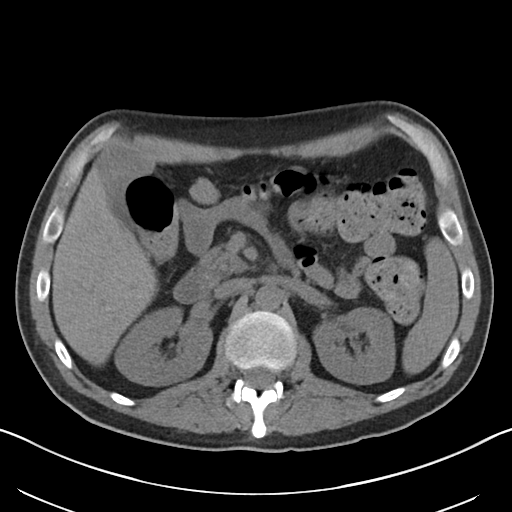
[im 38/57  soft-tissue]
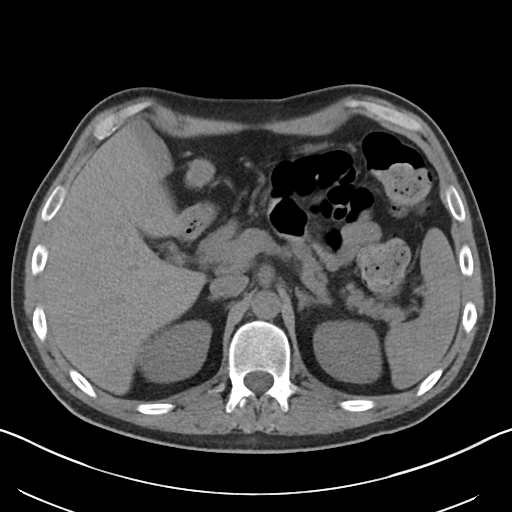
[im 38/57  bone]
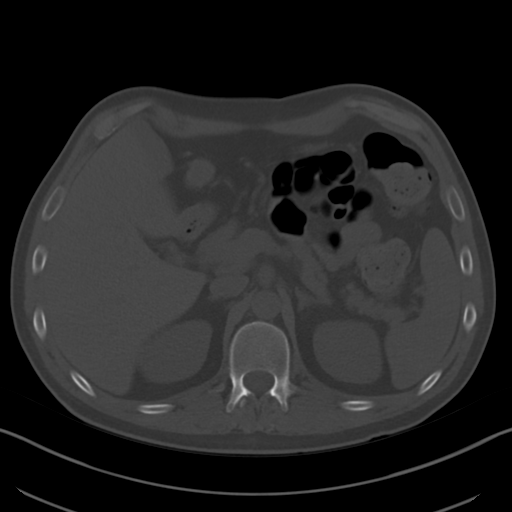
[im 40/57  soft-tissue]
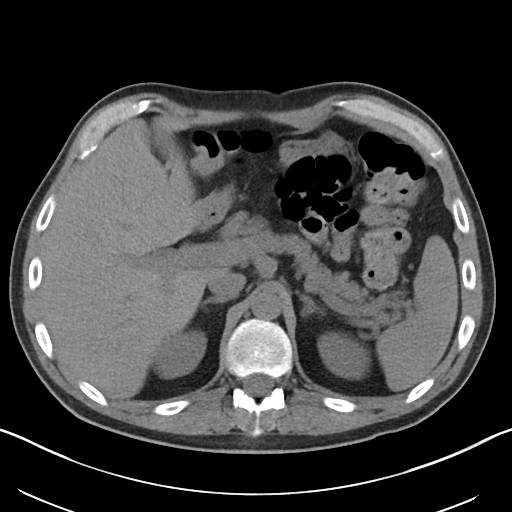
[im 45/57  soft-tissue]
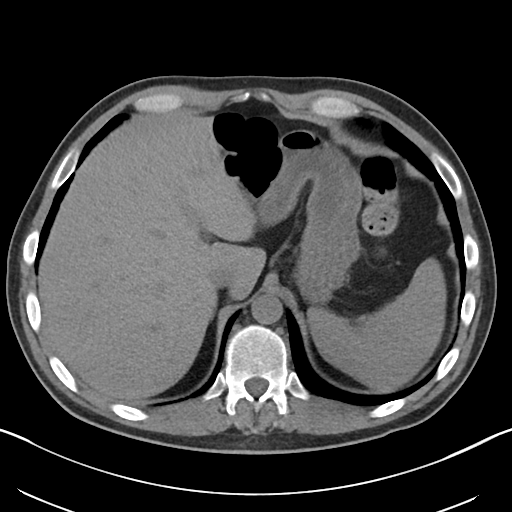
[im 50/57  soft-tissue]
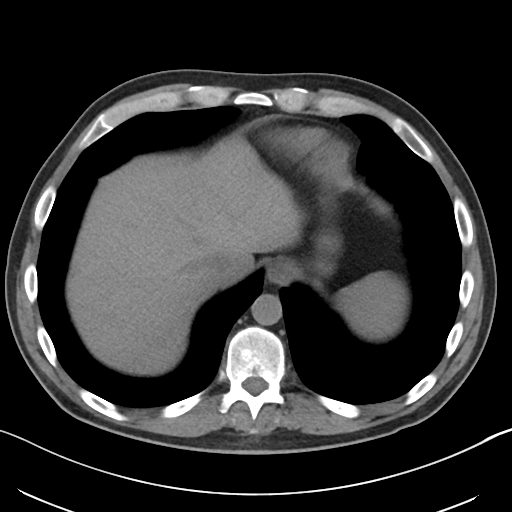
[im 54/57  soft-tissue]
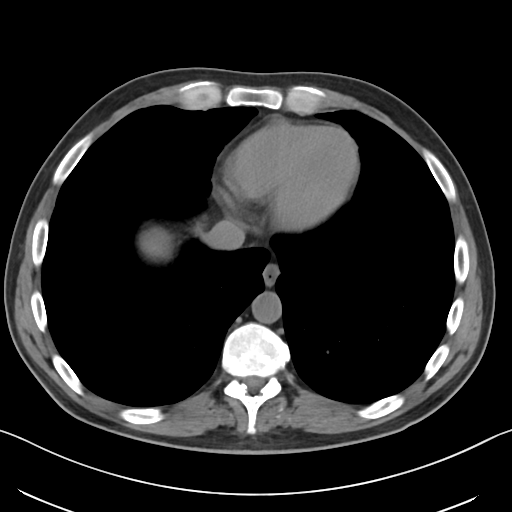

[Series 5: coronal · coronal · 0.55mm/px · 3 of 94 slices shown]
[im 32/94  soft-tissue]
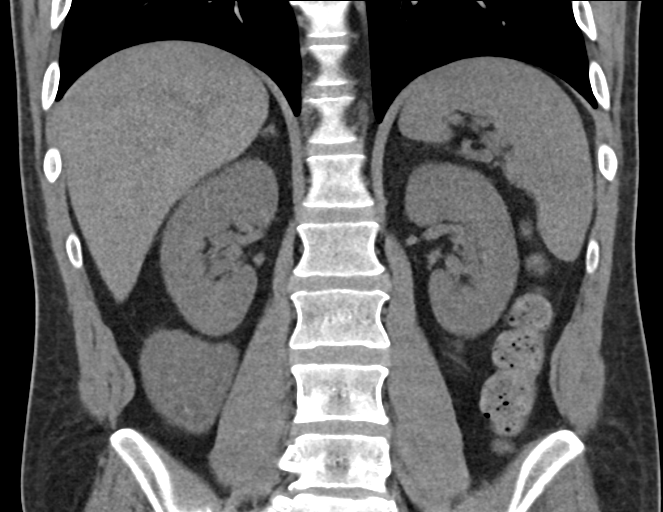
[im 42/94  soft-tissue]
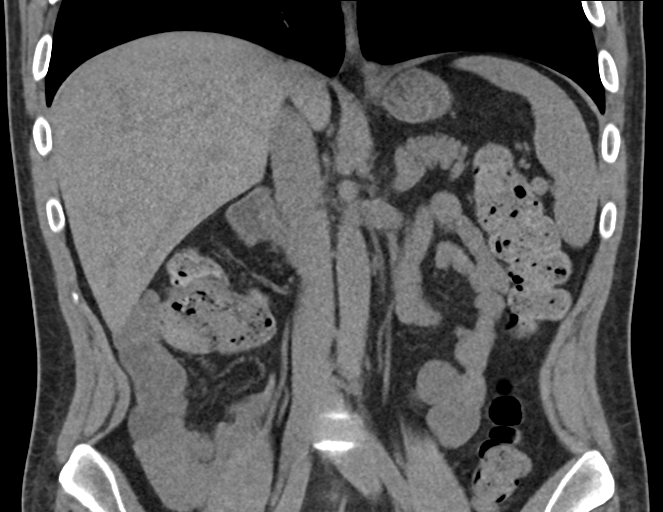
[im 52/94  soft-tissue]
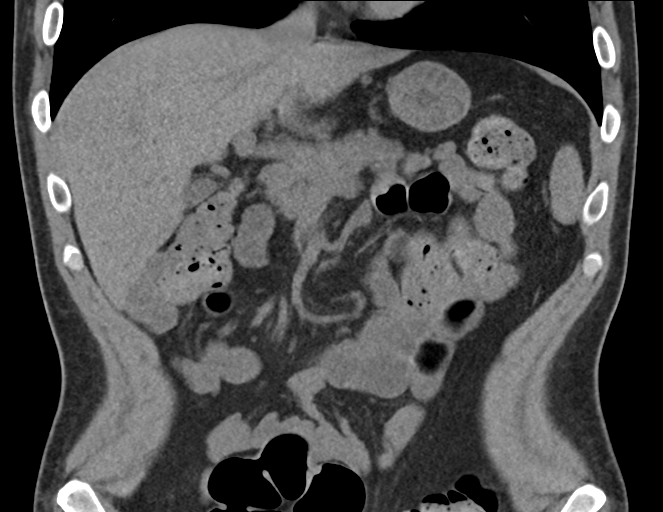

[16 of 46 positions shown; findings below may reference images not displayed]

FINDINGS: Lower chest: 8 x 5 mm nodule in the right middle lobe is not
significantly changed since prior PET-CT. Visualized lung bases are
otherwise clear.

Hepatobiliary: No focal liver abnormality is seen. No gallstones,
gallbladder wall thickening, or biliary dilatation.

Pancreas: Unremarkable. No pancreatic ductal dilatation or
surrounding inflammatory changes.

Spleen: Normal in size without focal abnormality.

Adrenals/Urinary Tract: Adrenal glands are normal in appearance.
Multiple bilateral nonobstructing renal calculi with the largest
measuring 8 mm, located on the right.

Stomach/Bowel: No dilated loops of bowel within the visualized
abdomen. Transfers colon is in close proximity to the stomach.

Vascular/Lymphatic: No enlarged retroperitoneal or mesenteric lymph
nodes.

Other: No abdominal wall hernia or abnormality.

Musculoskeletal: No acute or significant osseous findings.
IMPRESSION: 1. Short segment of transverse colon is in close proximity to the
stomach. It is possible that this segment can be displaced by
inflating the stomach, creating a safe percutaneous window for
G-tube placement. The patient should be given oral contrast the
night before gastrostomy tube is attempted by IR.
2. Nonobstructing bilateral renal calculi.

## 2022-10-13 IMAGING — CT CT CHEST W/ CM
2 of 4 series · 15 of 36 positions shown, 18 images · IV contrast (omnipaque)
Comparison: 10/25/2020

CLINICAL DATA: Head and neck cancer, pulmonary metastatic disease,
ongoing chemotherapy, assess treatment response

EXAM:
CT CHEST WITH CONTRAST
TECHNIQUE: Multidetector CT imaging of the chest was performed during
intravenous contrast administration.
CONTRAST:  80mL OMNIPAQUE IOHEXOL 350 MG/ML SOLN

[Series 3: axial st · axial · 0.72mm/px · z∈[-457,-191]mm · 12 of 157 slices shown, 15 images]
[im 12/157  mediastinal]
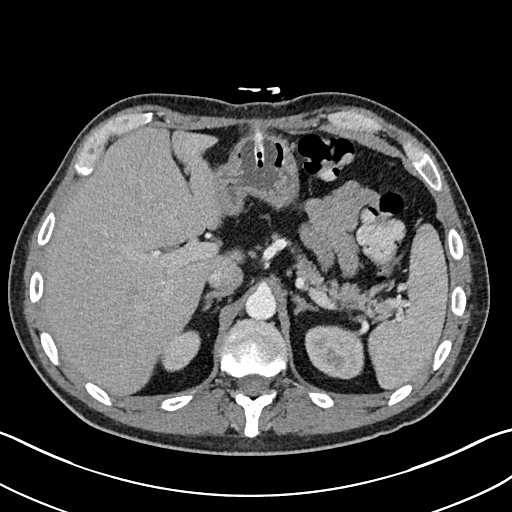
[im 12/157  lung]
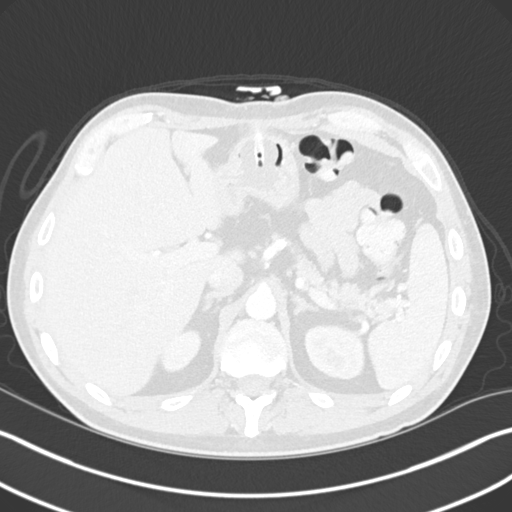
[im 23/157  lung]
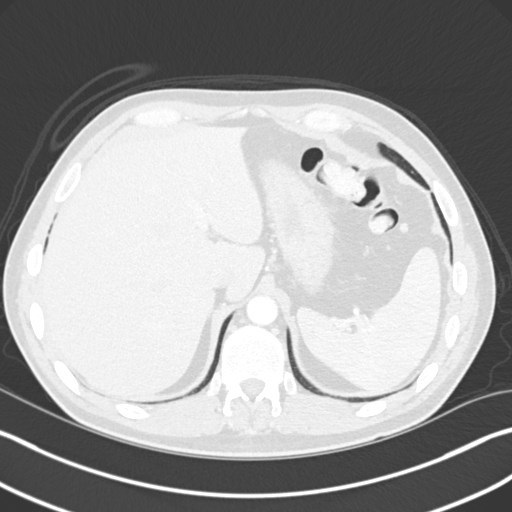
[im 34/157  lung]
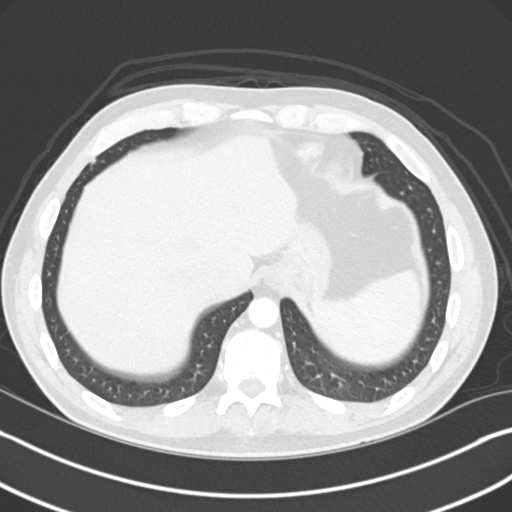
[im 45/157  lung]
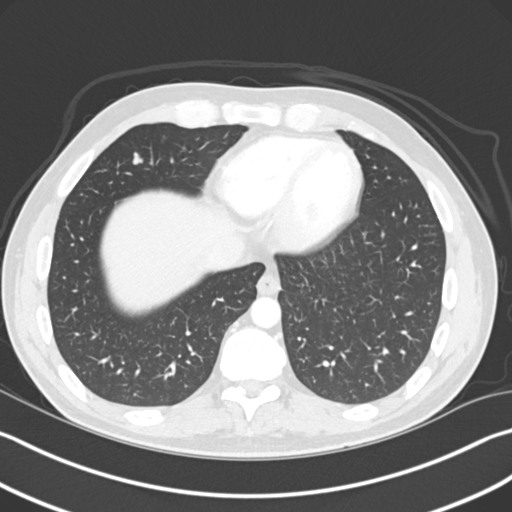
[im 56/157  mediastinal]
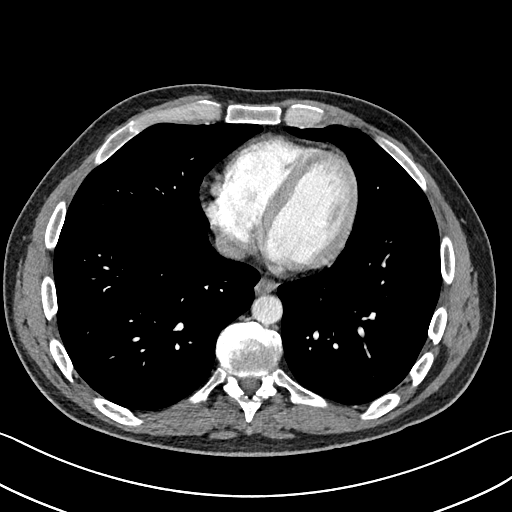
[im 56/157  lung]
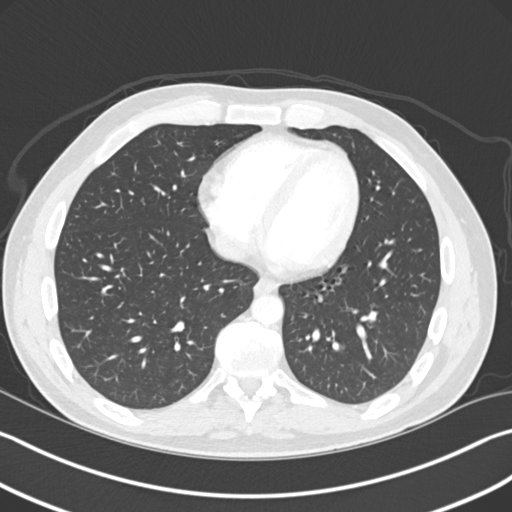
[im 67/157  lung]
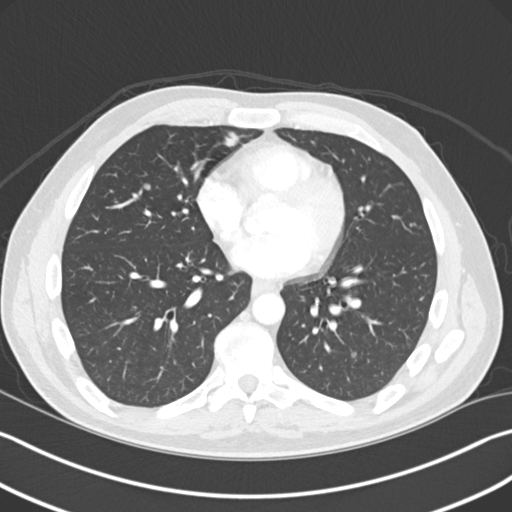
[im 90/157  lung]
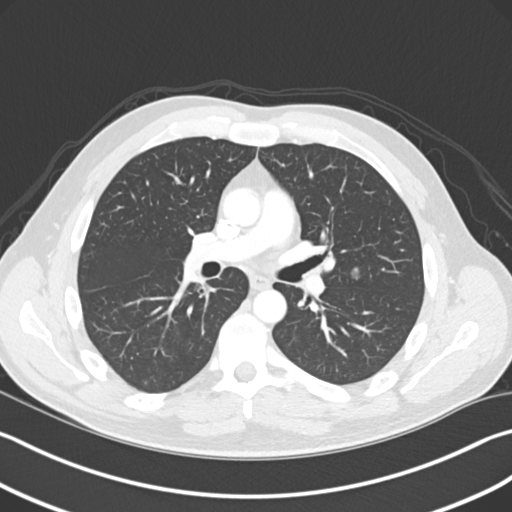
[im 101/157  lung]
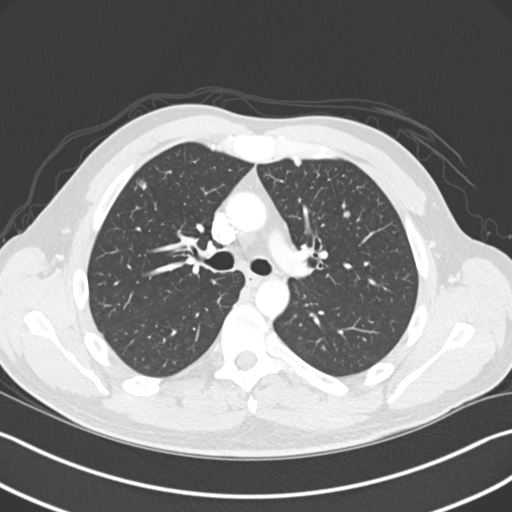
[im 112/157  mediastinal]
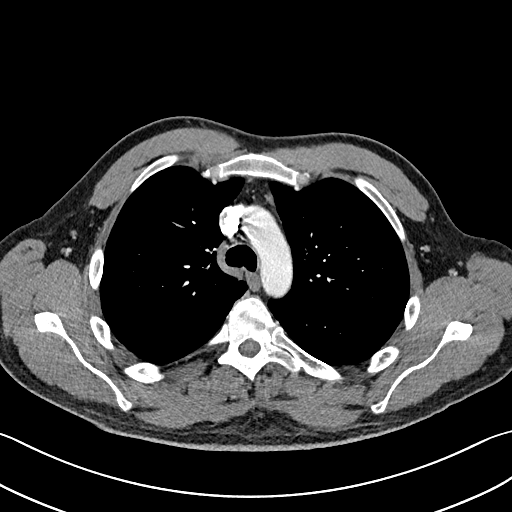
[im 112/157  lung]
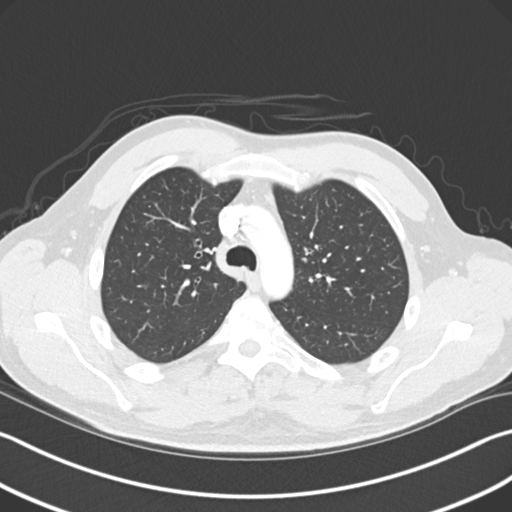
[im 123/157  lung]
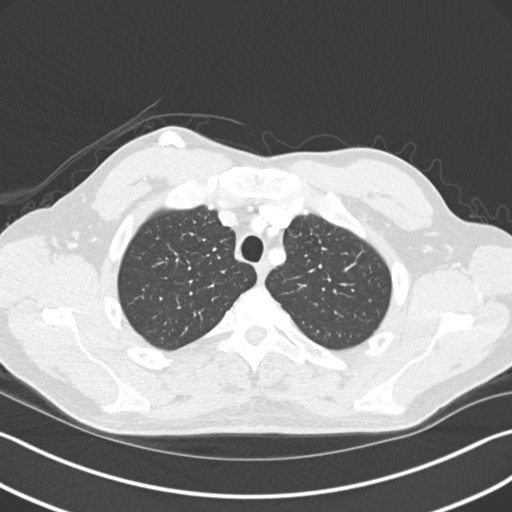
[im 134/157  lung]
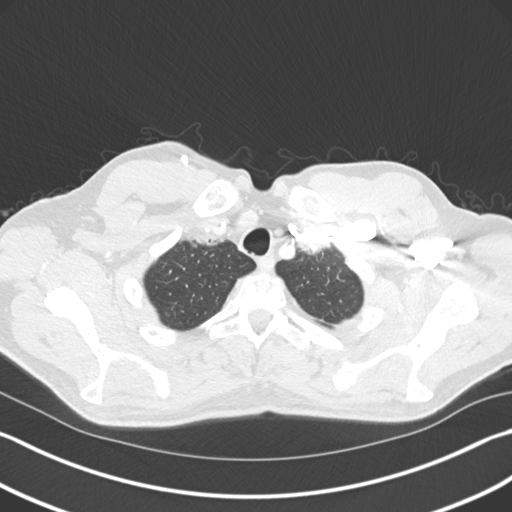
[im 145/157  lung]
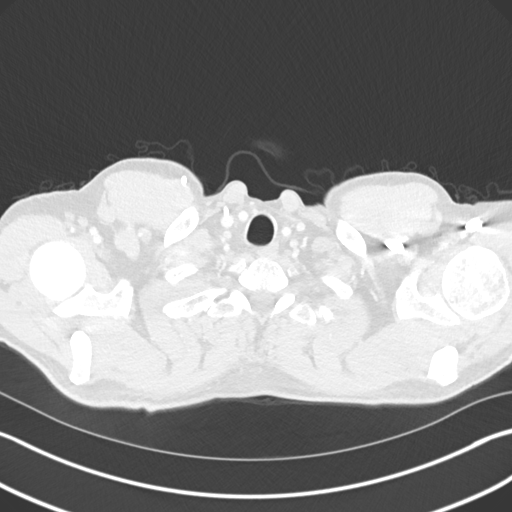

[Series 5: coronal · coronal · 0.61mm/px · 3 of 134 slices shown]
[im 27/134  lung]
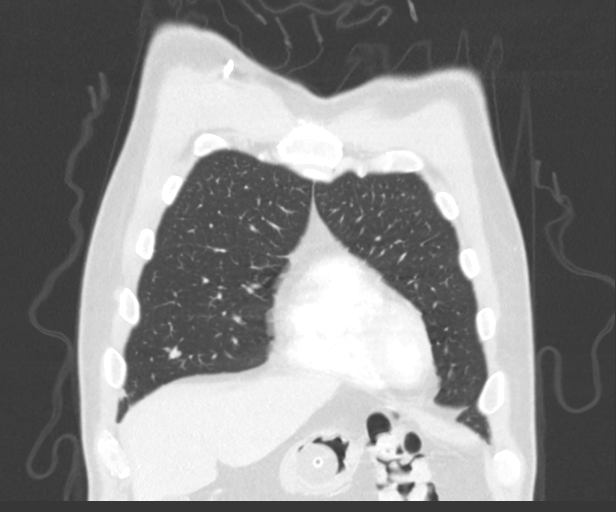
[im 54/134  lung]
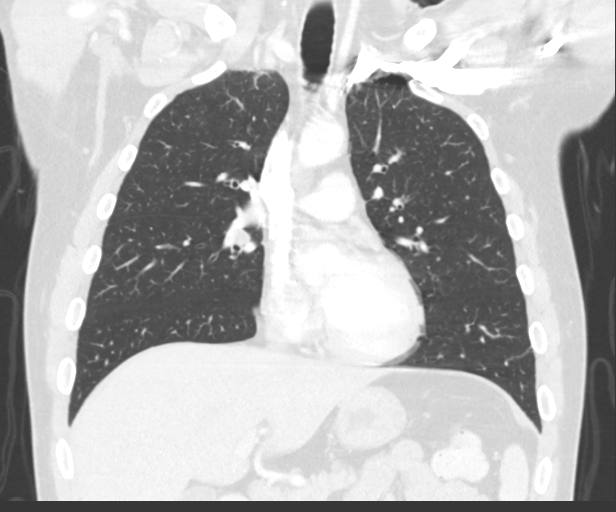
[im 80/134  lung]
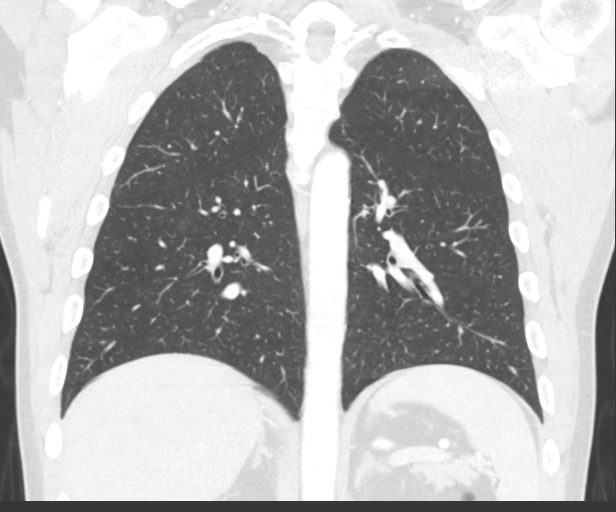

[15 of 36 positions shown; findings below may reference images not displayed]

FINDINGS: Cardiovascular: Right chest port catheter. Normal heart size. No
pericardial effusion.

Mediastinum/Nodes: No enlarged mediastinal, hilar, or axillary lymph
nodes. Thyroid gland, trachea, and esophagus demonstrate no
significant findings.

Lungs/Pleura: Minimal radiation fibrosis of the anterior apices
(series 7, image 21). Multiple small bilateral pulmonary nodules are
slightly enlarged compared to prior examination, an index nodule of
the medial right middle lobe measuring 1.3 x 1.1 cm previously 1.1 x
0.9 cm previously series 7, image 90), an additional index nodule of
the posterior left upper lobe measuring 0.7 cm, previously 0.5 cm
(series 7, image 72), an additional index nodule of the central
right upper lobe measuring 0.6 cm, previously 0.3 cm (series 7,
image 44). No pleural effusion or pneumothorax.

Upper Abdomen: No acute abnormality.  Percutaneous gastrostomy tube.

Musculoskeletal: No chest wall mass or suspicious bone lesions
identified.
IMPRESSION: 1. Multiple small bilateral pulmonary nodules are slightly enlarged
compared to prior examination, consistent with worsened pulmonary
metastatic disease.
2. Minimal radiation fibrosis of the anterior lung apices.

## 2022-12-18 NOTE — Telephone Encounter (Signed)
Telephone call
